# Patient Record
Sex: Male | Born: 1950 | Race: Black or African American | Hispanic: No | Marital: Single | Smoking: Never smoker
Health system: Southern US, Community
[De-identification: ages and names within clinical notes are randomized; demographics above are authoritative.]

## PROBLEM LIST (undated history)

## (undated) DIAGNOSIS — N390 Urinary tract infection, site not specified: Secondary | ICD-10-CM

## (undated) DIAGNOSIS — R634 Abnormal weight loss: Secondary | ICD-10-CM

## (undated) DIAGNOSIS — C61 Malignant neoplasm of prostate: Secondary | ICD-10-CM

## (undated) DIAGNOSIS — D649 Anemia, unspecified: Secondary | ICD-10-CM

## (undated) DIAGNOSIS — N400 Enlarged prostate without lower urinary tract symptoms: Secondary | ICD-10-CM

## (undated) DIAGNOSIS — R339 Retention of urine, unspecified: Secondary | ICD-10-CM

## (undated) DIAGNOSIS — I10 Essential (primary) hypertension: Secondary | ICD-10-CM

## (undated) DIAGNOSIS — Z466 Encounter for fitting and adjustment of urinary device: Secondary | ICD-10-CM

## (undated) DIAGNOSIS — A530 Latent syphilis, unspecified as early or late: Secondary | ICD-10-CM

## (undated) DIAGNOSIS — R399 Unspecified symptoms and signs involving the genitourinary system: Secondary | ICD-10-CM

## (undated) DIAGNOSIS — N3001 Acute cystitis with hematuria: Secondary | ICD-10-CM

## (undated) DIAGNOSIS — K759 Inflammatory liver disease, unspecified: Secondary | ICD-10-CM

## (undated) DIAGNOSIS — F0390 Unspecified dementia without behavioral disturbance: Secondary | ICD-10-CM

## (undated) HISTORY — PX: NO PAST SURGERIES: SHX2092

## (undated) HISTORY — DX: Encounter for fitting and adjustment of urinary device: Z46.6

## (undated) HISTORY — DX: Essential (primary) hypertension: I10

## (undated) HISTORY — DX: Unspecified symptoms and signs involving the genitourinary system: R39.9

## (undated) HISTORY — DX: Acute cystitis with hematuria: N30.01

## (undated) HISTORY — DX: Retention of urine, unspecified: R33.9

## (undated) HISTORY — DX: Abnormal weight loss: R63.4

---

## 2014-09-09 ENCOUNTER — Inpatient Hospital Stay
Admit: 2014-09-09 | Disposition: A | Discharge status: Home / Self Care | Payer: Self-pay | Attending: Internal Medicine | Admitting: Internal Medicine

## 2014-09-09 LAB — URINALYSIS, COMPLETE
BACTERIA: NONE SEEN
Bilirubin,UR: NEGATIVE
Glucose,UR: 150 mg/dL (ref 0–75)
Ketone: NEGATIVE
Leukocyte Esterase: NEGATIVE
Nitrite: NEGATIVE
PH: 5 (ref 4.5–8.0)
Protein: 100
RBC,UR: 13 /HPF (ref 0–5)
Specific Gravity: 1.011 (ref 1.003–1.030)
WBC UR: 5 /HPF (ref 0–5)

## 2014-09-09 LAB — COMPREHENSIVE METABOLIC PANEL
ALBUMIN: 4.3 g/dL
ANION GAP: 11 (ref 7–16)
Alkaline Phosphatase: 61 U/L
BILIRUBIN TOTAL: 1.3 mg/dL — AB
BUN: 71 mg/dL — ABNORMAL HIGH
CO2: 26 mmol/L
Calcium, Total: 9.3 mg/dL
Chloride: 99 mmol/L — ABNORMAL LOW
Creatinine: 6.86 mg/dL — ABNORMAL HIGH
EGFR (Non-African Amer.): 8 — ABNORMAL LOW
GFR CALC AF AMER: 9 — AB
GLUCOSE: 126 mg/dL — AB
POTASSIUM: 4.4 mmol/L
SGOT(AST): 45 U/L — ABNORMAL HIGH
SGPT (ALT): 57 U/L
Sodium: 136 mmol/L
TOTAL PROTEIN: 9.4 g/dL — AB

## 2014-09-09 LAB — CBC WITH DIFFERENTIAL/PLATELET
BASOS PCT: 0.4 %
Basophil #: 0 10*3/uL (ref 0.0–0.1)
EOS ABS: 0 10*3/uL (ref 0.0–0.7)
Eosinophil %: 0.1 %
HCT: 50.9 % (ref 40.0–52.0)
HGB: 17.3 g/dL (ref 13.0–18.0)
Lymphocyte #: 1.2 10*3/uL (ref 1.0–3.6)
Lymphocyte %: 10.3 %
MCH: 32.1 pg (ref 26.0–34.0)
MCHC: 34.1 g/dL (ref 32.0–36.0)
MCV: 94 fL (ref 80–100)
Monocyte #: 1 x10 3/mm (ref 0.2–1.0)
Monocyte %: 8.7 %
NEUTROS PCT: 80.5 %
Neutrophil #: 9.2 10*3/uL — ABNORMAL HIGH (ref 1.4–6.5)
Platelet: 123 10*3/uL — ABNORMAL LOW (ref 150–440)
RBC: 5.4 10*6/uL (ref 4.40–5.90)
RDW: 12.9 % (ref 11.5–14.5)
WBC: 11.4 10*3/uL — AB (ref 3.8–10.6)

## 2014-09-09 LAB — LIPASE, BLOOD: LIPASE: 45 U/L

## 2014-09-09 LAB — TROPONIN I
Troponin-I: 0.04 ng/mL — ABNORMAL HIGH
Troponin-I: 0.04 ng/mL — ABNORMAL HIGH

## 2014-09-09 LAB — CK-MB: CK-MB: 9.7 ng/mL — AB

## 2014-09-10 LAB — LIPID PANEL
Cholesterol: 127 mg/dL
HDL Cholesterol: 36 mg/dL — ABNORMAL LOW
LDL CHOLESTEROL, CALC: 72 mg/dL
Triglycerides: 94 mg/dL
VLDL Cholesterol, Calc: 19 mg/dL

## 2014-09-10 LAB — CBC WITH DIFFERENTIAL/PLATELET
BASOS PCT: 0.5 %
Basophil #: 0 10*3/uL (ref 0.0–0.1)
EOS PCT: 1.2 %
Eosinophil #: 0.1 10*3/uL (ref 0.0–0.7)
HCT: 44.6 % (ref 40.0–52.0)
HGB: 15.1 g/dL (ref 13.0–18.0)
LYMPHS PCT: 26.5 %
Lymphocyte #: 2.2 10*3/uL (ref 1.0–3.6)
MCH: 32.1 pg (ref 26.0–34.0)
MCHC: 33.9 g/dL (ref 32.0–36.0)
MCV: 95 fL (ref 80–100)
Monocyte #: 0.9 x10 3/mm (ref 0.2–1.0)
Monocyte %: 10.2 %
NEUTROS PCT: 61.6 %
Neutrophil #: 5.2 10*3/uL (ref 1.4–6.5)
PLATELETS: 107 10*3/uL — AB (ref 150–440)
RBC: 4.7 10*6/uL (ref 4.40–5.90)
RDW: 12.8 % (ref 11.5–14.5)
WBC: 8.4 10*3/uL (ref 3.8–10.6)

## 2014-09-10 LAB — BASIC METABOLIC PANEL
Anion Gap: 10 (ref 7–16)
Anion Gap: 8 (ref 7–16)
BUN: 63 mg/dL — ABNORMAL HIGH
BUN: 47 mg/dL — AB
CREATININE: 2.08 mg/dL — AB
Calcium, Total: 8.8 mg/dL — ABNORMAL LOW
Calcium, Total: 8.5 mg/dL — ABNORMAL LOW
Chloride: 104 mmol/L
Chloride: 109 mmol/L
Co2: 27 mmol/L
Co2: 24 mmol/L
Creatinine: 3.53 mg/dL — ABNORMAL HIGH
EGFR (African American): 38 — ABNORMAL LOW
GFR CALC NON AF AMER: 33 — AB
GLUCOSE: 102 mg/dL — AB
GLUCOSE: 95 mg/dL
POTASSIUM: 3.7 mmol/L
Potassium: 3.7 mmol/L
SODIUM: 141 mmol/L
Sodium: 141 mmol/L

## 2014-09-10 LAB — CK-MB
CK-MB: 6.1 ng/mL — ABNORMAL HIGH
CK-MB: 7.4 ng/mL — AB

## 2014-09-10 LAB — TROPONIN I: Troponin-I: 0.04 ng/mL — ABNORMAL HIGH

## 2014-09-11 LAB — BASIC METABOLIC PANEL
ANION GAP: 5 — AB (ref 7–16)
BUN: 33 mg/dL — ABNORMAL HIGH
CO2: 25 mmol/L
Calcium, Total: 8.6 mg/dL — ABNORMAL LOW
Chloride: 107 mmol/L
Creatinine: 1.7 mg/dL — ABNORMAL HIGH
EGFR (African American): 48 — ABNORMAL LOW
GFR CALC NON AF AMER: 42 — AB
Glucose: 124 mg/dL — ABNORMAL HIGH
POTASSIUM: 3.7 mmol/L
SODIUM: 137 mmol/L

## 2014-09-11 LAB — MAGNESIUM: Magnesium: 2.1 mg/dL

## 2014-09-12 LAB — BASIC METABOLIC PANEL
Anion Gap: 2 — ABNORMAL LOW (ref 7–16)
BUN: 17 mg/dL
CALCIUM: 8.3 mg/dL — AB
Chloride: 107 mmol/L
Co2: 28 mmol/L
Creatinine: 1.28 mg/dL — ABNORMAL HIGH
EGFR (African American): >60
GFR CALC NON AF AMER: 59 — AB
Glucose: 105 mg/dL — ABNORMAL HIGH
Potassium: 3.3 mmol/L — ABNORMAL LOW
Sodium: 137 mmol/L

## 2014-09-16 ENCOUNTER — Emergency Department
Admit: 2014-09-16 | Disposition: A | Discharge status: Home / Self Care | Payer: Self-pay | Admitting: Emergency Medicine

## 2014-09-16 LAB — CBC WITH DIFFERENTIAL/PLATELET
BASOS ABS: 0.1 10*3/uL (ref 0.0–0.1)
Basophil %: 0.6 %
EOS PCT: 0.3 %
Eosinophil #: 0 10*3/uL (ref 0.0–0.7)
HCT: 45.7 % (ref 40.0–52.0)
HGB: 15.9 g/dL (ref 13.0–18.0)
Lymphocyte #: 1 10*3/uL (ref 1.0–3.6)
Lymphocyte %: 7.5 %
MCH: 32.8 pg (ref 26.0–34.0)
MCHC: 34.7 g/dL (ref 32.0–36.0)
MCV: 95 fL (ref 80–100)
MONOS PCT: 5.5 %
Monocyte #: 0.7 x10 3/mm (ref 0.2–1.0)
Neutrophil #: 11.3 10*3/uL — ABNORMAL HIGH (ref 1.4–6.5)
Neutrophil %: 86.1 %
Platelet: 151 10*3/uL (ref 150–440)
RBC: 4.84 10*6/uL (ref 4.40–5.90)
RDW: 12.1 % (ref 11.5–14.5)
WBC: 13.1 10*3/uL — ABNORMAL HIGH (ref 3.8–10.6)

## 2014-09-16 LAB — URINALYSIS, COMPLETE
Bilirubin,UR: NEGATIVE
GLUCOSE, UR: NEGATIVE mg/dL (ref 0–75)
Ketone: NEGATIVE
Nitrite: NEGATIVE
Ph: 8 (ref 4.5–8.0)
Protein: NEGATIVE
SPECIFIC GRAVITY: 1.005 (ref 1.003–1.030)
Squamous Epithelial: NONE SEEN

## 2014-09-16 LAB — COMPREHENSIVE METABOLIC PANEL
ALBUMIN: 3.8 g/dL
AST: 36 U/L
Alkaline Phosphatase: 60 U/L
Anion Gap: 8 (ref 7–16)
BILIRUBIN TOTAL: 1.3 mg/dL — AB
BUN: 8 mg/dL
CHLORIDE: 101 mmol/L
CREATININE: 1.3 mg/dL — AB
Calcium, Total: 8.9 mg/dL
Co2: 26 mmol/L
EGFR (Non-African Amer.): 58 — ABNORMAL LOW
Glucose: 143 mg/dL — ABNORMAL HIGH
POTASSIUM: 3.4 mmol/L — AB
SGPT (ALT): 43 U/L
SODIUM: 135 mmol/L
Total Protein: 8.4 g/dL — ABNORMAL HIGH

## 2014-09-16 LAB — TROPONIN I: Troponin-I: <0.03 ng/mL

## 2014-09-17 ENCOUNTER — Emergency Department
Admit: 2014-09-17 | Disposition: A | Discharge status: Home / Self Care | Payer: Self-pay | Admitting: Emergency Medicine

## 2014-09-17 LAB — URINALYSIS, COMPLETE
Bilirubin,UR: NEGATIVE
Glucose,UR: NEGATIVE mg/dL (ref 0–75)
KETONE: NEGATIVE
Leukocyte Esterase: NEGATIVE
Nitrite: NEGATIVE
PH: 6 (ref 4.5–8.0)
Protein: 100
Specific Gravity: 1.014 (ref 1.003–1.030)

## 2014-09-18 LAB — URINE CULTURE

## 2014-09-19 LAB — URINE CULTURE

## 2014-10-10 NOTE — Discharge Summary (Signed)
PATIENT NAME:  Daniel Knapp, Daniel Knapp MR#:  [id number] DATE OF BIRTH:  10-Nov-1950  DATE OF ADMISSION:  09/09/2014 DATE OF DISCHARGE:  09/12/2014  DISCHARGE DIAGNOSES:  1.  Acute renal failure due to enlarged prostate.  2.  Hypertension.   MEDICATIONS ON DISCHARGE:  1.  Finasteride 5 mg oral tablet once a day.  2.  Tamsulosin 0.4 mg oral capsule once a day.  3.  Atorvastatin 20 mg once a day.  4.  Metoprolol 25 mg every 6 hours.   INSTRUCTIONS: Advised to discharge home with Foley catheter and Foley bag and renal diet of low sodium and follow up in urology clinic in 1-2 weeks.   HISTORY OF PRESENT ILLNESS: A 64 year old African American male with remote history of hypertension, not on antihypertensive medications, presented with 2-3 days of nausea, vomiting, and urinary incontinence. Also, had some feeling of lower abdominal fullness and intermittent pain. He had some frequent nocturia and incomplete voiding and back pain and chills over the past few days, but he denied any chest pain, palpitations, headache, or edema. No syncopal episode. On presentation to the Emergency Room, he was found to be hypertension. CT scan of the abdomen showed a bladder outlet obstruction with hydronephrosis and troponins were mildly elevated. Creatine was 6.8 on admission, so he was admitted with acute renal failure due to obstruction to hospitalist team.   [place]:  1.  Acute renal failure due to bladder outlet obstruction secondary to prostate enlargement: He was seen by urologist and nephrology. A Foley catheter was placed and started on Flomax and finasteride. Renal function improved gradually in the next 1-2 days. He was also on IV fluids, but fluids were stopped once his obstruction resolved and kidney function started improving. It had significant improvement in the next 2 days, so advised to follow in urology clinic.   2.  Hypertension: The patient was on metoprolol and hydralazine in hospital, and  blood pressure was stable.  3.  Elevated troponin: It remained stable on further follow up and most likely this was due to renal failure, so no further workup was done.   CONSULTS [place]: Nephrology with Dr. Trinda Pascal and urology consult   [place]: Troponin 0.04. WBC count was 11.4, hemoglobin 17.3, and platelet count 123,000, MCV is 94. Glucose 126, BUN 71, creatinine 6.86, sodium 136, potassium 44. Urinalysis is grossly negative. CT of the abdomen and pelvis without contrast showed significantly enlarged prostate; mild bladder distention; bilateral hydronephrosis, right greater than left; perinephric stranding around the right kidney. Echocardiogram showed ejection fraction 70%-75%, elevated mean left atrial pressure, impaired relaxation pattern of left ventricular diastolic filling. Creatinine came down on April 1 to 3.53, and on further followup in the evening it was 2.08; the next day, on April 2, creatinine was 1.74, and we decided discharge on April 3 when creatinine was 1.28.   Total Time Spent ON this discharge: 40 minutes.    ____________________________ Ceasar Lund Anselm Jungling, MD vgv:bm D: 09/15/2014 22:31:27 ET T: 09/16/2014 07:56:59 ET JOB#: [id number]  cc: Ceasar Lund. Anselm Jungling, MD, <Dictator> Sherlynn Stalls, MD  Vaughan Basta MD ELECTRONICALLY SIGNED 10/04/2014 23:31

## 2014-10-10 NOTE — H&P (Signed)
PATIENT NAME:  Daniel Knapp, Daniel Knapp MR#:  [id number] DATE OF BIRTH:  Oct 04, 1950  DATE OF ADMISSION:  09/09/2014  PRIMARY CARE PHYSICIAN: None.   REFERRING EMERGENCY ROOM PHYSICIAN: Dr. Edd Fabian.     CHIEF COMPLAINT: Nausea, vomiting, and urinary incontinence.   HISTORY OF PRESENT ILLNESS: This very pleasant 64 year old African-American male with a remote history of hypertension, not on any antihypertensive medications, presents with 2-3 days of nausea, vomiting, and urinary incontinence. He does describe a full feeling in the lower abdomen with intermittent pain. He reports that he had frequent nocturia, incomplete voiding, and incontinent with uncontrollable urination. He has also had back pain and chills over the past few days. He denies any chest pain, palpitations, headache, or edema. He states that he may have had some decrease in exercise tolerance over the past few week. No syncope. On presentation to the Emergency Room he was found to be hypertensive, CT abdomen shows a bladder outlet obstruction with hydronephrosis, and troponins are mildly elevated. Hospitalist services are asked to admit for further evaluation and treatment. The patient is in acute renal failure with creatinine of 6.8.   PAST MEDICAL HISTORY: Hypertension, not currently taking any medications.   PAST SURGICAL HISTORY: None.   SOCIAL HISTORY: The patient lives with his sister. He does not smoke cigarettes. He drinks 3-4 alcoholic beverages daily. He works as a Development worker, international aid. No illicit substance abuse.   FAMILY MEDICAL HISTORY: Positive for coronary artery disease and diabetes in his mother and chronic anemia in his father.    REVIEW OF SYSTEMS:  CONSTITUTIONAL: Positive for chills. No fevers. Positive for some fatigue and decreased exercise tolerance. No change in weight.  HEENT: No change in vision or hearing. No pain in the eyes or ears. No sore throat or difficulty swallowing. No sinus congestion.  RESPIRATORY: No shortness  of breath, wheezing, coughing, or painful respiration. CARDIOVASCULAR: No chest pain, palpitations, syncope, orthopnea, or edema.  GASTROINTESTINAL: Positive as above for nausea and vomiting without diarrhea. Positive for abdominal pain. No hematemesis.  GENITOURINARY: Positive for frequency, dysuria, incontinence, and lower abdominal pain.  SKIN: No new rashes, lesions, or wounds.  ENDOCRINE: No polyuria, polydipsia, hot or cold intolerance.  MUSCULOSKELETAL: No new pain in the neck, shoulders, knees, or hips. He does have back pain which is new. No history of gout.  NEUROLOGIC: No focal numbness or weakness. No headaches, seizure, or confusion. No CVA history.  PSYCHIATRIC: No bipolar disorder or schizophrenia.   HOME MEDICATIONS: None.   ALLERGIES: No known allergies.   PHYSICAL EXAMINATION:  VITAL SIGNS: Temperature 98, pulse 101, respirations 18, blood pressure 166/101, oxygenation 99% on room air.  GENERAL: No acute distress.  HEENT: Pupils equal, round, and reactive to light. Conjunctivae are clear. Extraocular motion intact. Oral mucous membranes pink and moist. Posterior oropharynx is clear with no exudate, edema, or erythema. He has upper dentures lower teeth are intact. No signs of dental infection.  NECK: No cervical lymphadenopathy. Trachea midline. Thyroid nontender.  RESPIRATORY: Lungs are clear to auscultation bilaterally with good air movement.  CARDIOVASCULAR: Regular rate and rhythm. No murmurs, rubs, or gallops. No peripheral edema. Peripheral pulses are 1 +. No JVD. No carotid bruit.  ABDOMEN: Soft, nontender, nondistended. Bowel sounds are normal. No guarding, no rebound, no hepatosplenomegaly. No mass.  MUSCULOSKELETAL: No swollen tender joints. Range of motion is normal. Strength is 5 out of 5 throughout.  SKIN: No rashes, lesions, or open wounds.  NEUROLOGIC: Cranial nerves II through XII grossly intact.  Strength and sensation are intact, nonfocal.  PSYCHIATRIC: The  patient is alert and oriented with good insight into his clinical conditions. No signs of uncontrolled depression or anxiety.   LABORATORY DATA:  Sodium 136, potassium 4.4, chloride 99, bicarbonate 26, BUN 71, creatinine 6.8, glucose 126, total protein 9.4, albumin 4.3, bilirubin 1.3, alkaline phosphatase 61, AST 45, ALT 57. Troponin 0.04. White blood cells 11.4, hemoglobin 17.3, platelets 123,000, MCV is 94. UA with proteinuria, 13 red blood cells per high-powered field and 5 white blood cells per high-powered field.   [place]:  1.  CT of the abdomen and pelvis without contrast shows significantly enlarged prostate, mild bladder distention, and bilateral hydronephrosis right greater than left, perinephric stranding around the right kidney and ureter, no renal or ureteral stones, findings presumably related to bladder outlet obstruction.  2.  Chest x-ray shows no active cardiopulmonary disease.   ASSESSMENT AND PLAN:  1.  Acute renal failure with creatinine of 6.8: This is new. The patient does not have any history of renal failure in the past. I suspect that this is likely due to bladder outlet obstruction. He currently has a Foley catheter in place which is draining well. We will provide IV fluid and continued to the Foley catheter. Avoid nephrotoxic agents at this time. Continue to monitor his renal function. I have consulted with nephrology as he also has proteinuria. At this point we will hold off on additional renal testing to see if renal function improves with resolution of bladder outlet obstruction.  2.  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 I have started Flomax and placed a Foley catheter. I have consulted urology. The patient denies hematuria prior to placement of Foley catheter.  3.  Hypertension: The patient reports that he has a history of hypertension in the past, but stopped taking any medications for this. I have started metoprolol and hydralazine for blood pressure  control. He will also be on Flomax, which may help with blood pressure control. He will need an outpatient regimen which will be determined by his renal status upon discharge.  4.  Elevated troponin: The patient has mildly elevated troponin, but denies any overt signs of angina, no chest pain, shortness of breath, possible decreased exercise tolerance over time. EKG with no signs of ischemia. We will cycle cardiac enzymes and continue with aspirin and statin. We will monitor him on telemetry and obtain a 2-D echocardiogram which I suspect will show left ventricular hypertrophy due to uncontrolled hypertension over time.  5.  Prophylaxis. Heparin for DVT prophylaxis. No GI prophylaxis needed at this time.   CODE STATUS:  The patient is a full code.   TIME SPENT ON ADMISSION:  50 minutes.     ____________________________ Earleen Newport. Volanda Napoleon, MD cpw:bu D: 09/09/2014 20:40:53 ET T: 09/09/2014 21:12:22 ET JOB#: [id number]  cc: Earleen Newport. Volanda Napoleon, MD, <Dictator> Aldean Jewett MD ELECTRONICALLY SIGNED 09/18/2014 14:38

## 2014-10-10 NOTE — Consult Note (Signed)
Consulting MD:  Volanda Napoleon complaint: Urinary retention, ARF 64 yo male admitted top [facility] yesterday w/ 1 week h/o nausea, feeling weak, fatigue, slow urinary stream, incontinence of urine (night>day), DOE. He has no prior h/o urinary issues. He has had tolerable decreased FOS in the past but has never sought help for this. He states that hios father had prostate problems, but does not know of family h/o PCa. He presented to the Er where he was found to be in urinary retention, w/ a distended bladder, bilateral hydro on CT as well as a serum creatinine of 7. His potassium was normal. A catheter was placed and he was admityted for medical and urologic management. A 14 fr foley was placed, and he has had some mild gross hematuria which was not pressent prior to catheter placement. remote h/o HBP diagnosed and treated while incarcerated several years ago-- not maneged lately--pt does not have a PCP N/A  N/A Single, lives w/ sister, self employed in [place] Pt states his father had prostate problems. CAD, anemia slow stream, urinary incontinence, nausea, lower abdominal pain   WDWN middle aged male in [place]/AT PERRL  EOMI supple CTA bilaterally RRR flat, soft, NDNT phallus uncirc. No lesions/phimosis. Cath present.Testers down bilaterally but atrophic NST. Gland nonnodular, 4+ w/o C,C,E grossly intact reviewed moderate bilateral hydrourweteronephrosis. Parenchyma appears normal (w/o contrast). Prostate large, likely w/ intravesical median lobe.  AUR--secondary to large prostate. No recent use of alpha adrenergic agents. Currently appropriately managed w/ foley drainage (although larger 16 or 18 fr foley better for men than current 14 fr.) Bilateral hydro w/ ARF secondary to AUR--hopefully this will improve w/ current catheter drainage Post obstructive diuresis Hematuria--most likely secondary to catheter trauma in a man w/ a large prostate--this is minimal and hopefully will clear   Agree w/  flomax--to maximaize medical therapy will add finmasteride--he should go home on both of these He should go home once Cr trend normalizes w/ foley--will need followup here in [place] with Dr Erlene Quan to discuss possible voiding trial vs Surgical management of BOO Would not over treat diuresis w/ IV fluids--this will resolve w/o IV fluid replacement as long as pt can take po fluis well. Keep check on K+ Arrange for follwup w/ Dr Erlene Quan follwing D/C I discussed current issues as well as followup w/ pt M. Dahlstedt, MD [facility], [place]  Electronic Signatures: Diona Fanti Lillette Boxer (MD)  (Signed on 01-Apr-16 11:36)  Authored  Last Updated: 01-Apr-16 11:36 by Jorja Loa (MD)

## 2014-10-10 NOTE — Consult Note (Signed)
Referring Physician: Dr Volanda Napoleon for Consult: AKI Daniel Knapp is a 64 year old AAM with hypertension and h/o enlarged prostate. He was in prison until about 3-4 years ago and was receiving some attention to these problems. However, when he was released, he did not follow with a physician. His baseline Cr is unknown and he was not taking any Rx medications. [name] with N/V and was found to have a Cr of 6.8. CT Scan no contrast demonstrated mild hydro on the left and moderate hydro on the right with an enlarged prostate. A foley catheter was placed. Urology saw the patient today. His Cr was 3.5 today. He is getting NS at 70 cc/hr. His urine is bloody. Daniel Knapp is pleasant and answering questions. He denies any physical complaints. He has not been ambulating. HTNEnlarged prostate   No kidney diseases 2 shots of ETOH/day  98  71  18  121/65   93RAclear bilalertRRR, no rubSoft, NTno edemaFoley draining with bloody urine Scan reviewedreviewed 64 year old AAM with unknown baseline Cr level now with AKI in settting of BOO AKI- baseline Cr unknown but not likely normal due to hypertension untreated. Continue Foley and will need long term f/up with urology and plan regarding drainage/enlarged prostate. Continue IVF but would change to 1/2 NS at 70 cc/hr and recheck BMP + Mg q12 to watch for hypernatremia, hypokalemia and hypomagnesemia. Replete lytes as needed. Would decrease/stop IVF in next 24hrs if patient able to take good oral liquid intake as IVF will stimulate further urine output. Pt should be monitored 24hrs off IVF to ensure stability of BP and renal function. HTN- continue metoprolol but would recommend stopping IV hydralazine. In addition, hydralazine should be decreased to bid for now as BP is well controlled and since he is at greater risk for over control of BP with diuresis/ambulation. Hopefully, he could only be discharged on metoprolol Enlarged prostate Hematuria/Proteinuria- no further  w/up at this time in the setting of AKI and BOO. We can re-evaluate these as an [place] TO SEE THE PATIENT IN FOLLOW-UP AT [place]. AN APPOINTMENT CAN BE SCHEDULED AT [phone] ON MONDAY FROM 8AM-5PM. I WILL NOT BE SEEING THE PATIENT ON 4/2 OR 4/3. PLEASE PAGE [phone] WITH QUESTIONS ON 4/2 AND 4/3.  Electronic Signatures: Trinda Pascal (MD)  (Signed on 01-Apr-16 14:54)  Authored  Last Updated: 01-Apr-16 14:54 by Trinda Pascal (MD)

## 2014-11-10 ENCOUNTER — Encounter: Payer: Self-pay | Admitting: Urology

## 2014-11-10 ENCOUNTER — Encounter: Payer: Self-pay | Admitting: *Deleted

## 2014-11-10 ENCOUNTER — Ambulatory Visit (INDEPENDENT_AMBULATORY_CARE_PROVIDER_SITE_OTHER): Payer: Self-pay | Admitting: Urology

## 2014-11-10 ENCOUNTER — Telehealth: Payer: Self-pay | Admitting: *Deleted

## 2014-11-10 VITALS — BP 143/83 | HR 69 | Ht 74.0 in | Wt 217.0 lb

## 2014-11-10 DIAGNOSIS — R338 Other retention of urine: Secondary | ICD-10-CM

## 2014-11-10 DIAGNOSIS — N401 Enlarged prostate with lower urinary tract symptoms: Secondary | ICD-10-CM | POA: Insufficient documentation

## 2014-11-10 DIAGNOSIS — R339 Retention of urine, unspecified: Secondary | ICD-10-CM

## 2014-11-10 DIAGNOSIS — N400 Enlarged prostate without lower urinary tract symptoms: Secondary | ICD-10-CM

## 2014-11-10 NOTE — Telephone Encounter (Signed)
Start day of Epic and I hit quick abstraction tab to input patient info and it saved it as a telephone message.

## 2014-11-10 NOTE — Progress Notes (Signed)
Simple Catheter Placement Due to urinary retention patient is present today for a foley cath placement.Patient was cleaned and prepped in a sterile fashion with betadine and lidocaine jelly 2 % was instilled into the uretha. A 18 FR coude foley catheter was inserted, urine return was noted 50 ml, urine was dark  yellow in color. The balloon was filled with 10 cc of sterile water. A leg bag was attached for drainage. Patient tolerated well, no complications.  Performed By: Lyndee Hensen CMA

## 2014-11-10 NOTE — Patient Instructions (Signed)
Patient is to continue the finasteride and Flomax. He will return in 1 month's time for catheter exchange.

## 2014-11-10 NOTE — Progress Notes (Signed)
Subjective:    Patient ID: Daniel Knapp, male    DOB: Mar 27, 1951, 64 y.o.   MRN: [id number]  HPI Daniel Knapp is a 64 year old African American male who presented to [place] in April after a recent admission for difficulty urinating with overflow incontinence and he was found to have acute renal failure related to lower tract obstruction from an enlarged prostate from March 31 through April 4. He was also seen in the emergency department on April 10 for chills and was found to have a urinary tract infection. He was started on Cipro at that time and has completed that regimen of antibiotic.  He was started on Flomax and finasteride in April 2016 after the findings of an enlarged prostate on CT scan.    Notes concerning admission in March: On admission he was found to be in urinary retention with distended bladder, bilateral hydronephrosis and significantly enlarged prostate on CT. Serum Cr was 6.89. A Foley catheter was placed and Flomax/Finasteride started. Over hospital course patient's Cr significantly improved and was 1.28 upon discharge 09/12/14.   CT ABDOMEN AND PELVIS W0 - Sep 09 2014 4:55PM FINDINGS: Adrenals/Urinary Tract: Adrenal glands normal. There is mild bilateral hydronephrosis scratch head there is mild left hydronephrosis and moderate right hydronephrosis. Perinephric stranding around the right kidney and proximal right ureter. Urinary bladder is distended. Prostate is enlarged. No renal or ureteral stones. IMPRESSION: Significantly enlarged prostate. Mild bladder distention and bilateral hydronephrosis, right greater than left. Perinephric stranding around the right kidney and ureter. No renal or ureteral stones. Findings presumably related to bladder outlet obstruction.  Patient has failed his voiding trial and an indwelling Foley is in place. I discussed with him CIC, but he did not want to learn how to catheterize himself.  This is because he works in [place] and is outdoors most of the time. I'm sure he has some embarrassment with the idea of catheterizing himself with his coworkers around him. Because of this,  he is found an indwelling Foley more convenient. He presented last week complaining of pus around the catheter at the meatus and he is describing what sounds like bladder spasms with leakage around the catheter. He presented to the office and the staff sent a urine for culture which returned contaminated.  Today, he presents for the same complaints. Reviewing his chart it is time to change the Foley catheter.  No Known Allergies   Medications Flomax 0.4 mg 1 capsule daily Finasteride 5 mg 1 tablet daily   Past Medical History  Diagnosis Date  . HTN (hypertension)   . Acute cystitis with hematuria   . BPH without obstruction/lower urinary tract symptoms   . Weight loss   . Urinary retention   . UTI symptoms   . Urinary catheter insertion/adjustment/removal    History   Social History  . Marital Status: Single    Spouse Name: N/A  . Number of Children: N/A  . Years of Education: N/A   Occupational History  . Not on file.   Social History Main Topics  . Smoking status: Never Smoker   . Smokeless tobacco: Not on file  . Alcohol Use: 0.0 oz/week    0 Standard drinks or equivalent per week     Comment: moderate  . Drug Use: Not on file  . Sexual Activity: Not on file   Other Topics Concern  . Not on file   Social History Narrative   No past surgical history on file.  Family History  Problem Relation Age of Onset  . Cancer Father   . Heart disease Mother    Review of Systems  Constitutional: Negative for fever, chills, diaphoresis, activity change, appetite change, fatigue and unexpected weight change.  HENT: Negative for congestion, dental problem, drooling, ear discharge, ear pain, facial swelling, hearing loss, sinus pressure, sneezing, sore throat, trouble swallowing and voice change.   Eyes:  Negative for photophobia, pain, discharge, redness, itching and visual disturbance.  Respiratory: Negative for apnea, cough, choking, chest tightness, shortness of breath, wheezing and stridor.   Cardiovascular: Negative for chest pain, palpitations and leg swelling.  Gastrointestinal: Negative for nausea, vomiting, diarrhea and constipation.  Endocrine: Negative for cold intolerance, heat intolerance, polydipsia, polyphagia and polyuria.  Genitourinary: Positive for discharge. Negative for hematuria.  Musculoskeletal: Negative for back pain and joint swelling.  Skin: Negative for color change, pallor, rash and wound.  Allergic/Immunologic: Negative for environmental allergies, food allergies and immunocompromised state.  Neurological: Negative for seizures, syncope and speech difficulty.  Hematological: Negative for adenopathy. Does not bruise/bleed easily.  Psychiatric/Behavioral: Negative for behavioral problems, confusion, sleep disturbance and agitation.   Filed Vitals:   11/10/14 0854  BP: 143/83  Pulse: 69   Filed Weights   11/10/14 0854  Weight: 217 lb (98.431 kg)      Objective:   Physical Exam  GU: Patient with an uncircumcised penis, Foley catheter in place, no drainage seen around the meatus at this time. Leg bag filled with a scant amount of urine with thick sediment. Patient states he emptied the bag recently.      Assessment & Plan:  1. Urinary retention          Patient was initiated on Flomax and finasteride in April after being admitted to the hospital. He was found to have an enlarged prostate on a CT scan performed in the emergency room. He has failed a voiding trial with [place]. We discussed CIC with the patient, but he is not willing to proceed with self-catheterization. 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  2. BPH with urinary  retention           Patient is currently on Flomax and finasteride in hopes that it may decrease the side of his prostate and therefore relieve his urinary retention. Patient has had indwelling Foley and since April he has failed one voiding trial with [place]. He did not have a PCP. He also does not have a recent PSA. We'll obtain a PSA once patient is voiding successfully on his own. Hopefully in July, we can attempt another voiding trial to see if he is able to void on his own.  3. Catheter exchanged           Is explained to the patient today that the pus that he is seen around the meatus is due to the irritation of the Foley catheter. Cesarean normal finding. I also explained to the patient that the urine squirting around the catheter is due to bladder spasms. I explained to him that I was hesitant to start a medication to treat the bladder spasms because it can inhibit his ability to urinate when we do attempt a voiding trial again in July. Patient's Foley catheter was exchanged today he will present in 1 month for his next catheter exchange.

## 2014-12-03 ENCOUNTER — Ambulatory Visit (INDEPENDENT_AMBULATORY_CARE_PROVIDER_SITE_OTHER): Payer: Self-pay | Admitting: Urology

## 2014-12-03 ENCOUNTER — Encounter: Payer: Self-pay | Admitting: Urology

## 2014-12-03 VITALS — BP 156/80 | HR 72 | Ht 74.0 in | Wt 218.2 lb

## 2014-12-03 DIAGNOSIS — R339 Retention of urine, unspecified: Secondary | ICD-10-CM

## 2014-12-03 DIAGNOSIS — N401 Enlarged prostate with lower urinary tract symptoms: Secondary | ICD-10-CM

## 2014-12-03 DIAGNOSIS — N400 Enlarged prostate without lower urinary tract symptoms: Secondary | ICD-10-CM

## 2014-12-03 DIAGNOSIS — R338 Other retention of urine: Principal | ICD-10-CM

## 2014-12-03 NOTE — Progress Notes (Signed)
12/03/2014 10:43 AM   Daniel Knapp [birth year] [id number]  Referring provider: No referring provider defined for this encounter.  Chief Complaint  Patient presents with  . Urinary Retention    One month follow up    HPI: Daniel Knapp is a 64 year old African-American male with an indwelling Foley who presents today for 1 month follow-up.  Patient had a Foley catheter placed in March 2016 after discovering he had bilateral hydronephrosis due to urinary tract obstruction from an enlarged prostate. He was placed on tamsulosin and finasteride in April 2016. 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 So even managing his bladder all at obstruction with an indwelling Foley which we exchange monthly.  Patient's would like to his Foley catheter removed for another voiding trial.  He had failed a voiding trial in a few weeks earlier. He is hopeful that he has been on the tamsulosin and finasteride long enough to cause a reduction in the size of his prostate allowing him to urinate freely on his own.  PMH: Past Medical History  Diagnosis Date  . HTN (hypertension)   . Acute cystitis with hematuria   . BPH without obstruction/lower urinary tract symptoms   . Weight loss   . Urinary retention   . UTI symptoms   . Urinary catheter insertion/adjustment/removal     Surgical History: No past surgical history on file.  Home Medications:    Medication List       This list is accurate as of: 12/03/14 10:43 AM.  Always use your most recent med list.               finasteride 5 MG tablet  Commonly known as:  PROSCAR  Take 5 mg by mouth daily.     metoprolol tartrate 25 MG tablet  Commonly known as:  LOPRESSOR  Take 25 mg by mouth 2 (two) times daily.     tamsulosin 0.4 MG Caps capsule  Commonly known as:  FLOMAX  Take 0.4 mg by mouth daily.        Allergies: No Known Allergies  Family History: Family History  Problem Relation Age of  Onset  . Cancer Father   . Heart disease Mother   . Kidney disease Neg Hx     Social History:  reports that he has never smoked. He does not have any smokeless tobacco history on file. He reports that he drinks alcohol. He reports that he does not use illicit drugs.  ROS: Urological Symptom Review  Patient is experiencing the following symptoms: Get up at night to urinate   Review of Systems  Gastrointestinal (upper)  : Negative for upper GI symptoms  Gastrointestinal (lower) : Negative for lower GI symptoms  Constitutional : Negative for symptoms  Skin: Negative for skin symptoms  Eyes: Negative for eye symptoms  Ear/Nose/Throat : Negative for Ear/Nose/Throat symptoms  Hematologic/Lymphatic: Negative for Hematologic/Lymphatic symptoms  Cardiovascular : Negative for cardiovascular symptoms  Respiratory : Negative for respiratory symptoms  Endocrine: Negative for endocrine symptoms  Musculoskeletal: Negative for musculoskeletal symptoms  Neurological: Negative for neurological symptoms  Psychologic: Negative for psychiatric symptoms   Physical Exam: BP 156/80 mmHg  Pulse 72  Ht 6\' 2"  (1.88 m)  Wt 218 lb 3.2 oz (98.975 kg)  BMI 28.00 kg/m2   Laboratory Data: Results for orders placed or performed in visit on 12/03/14  PSA  Result Value Ref Range   Prostate Specific Ag, Serum 3.3 0.0 -  4.0 ng/mL   Lab Results  Component Value Date   WBC 13.1* 09/16/2014   HGB 15.9 09/16/2014   HCT 45.7 09/16/2014   MCV 95 09/16/2014   PLT 151 09/16/2014    Lab Results  Component Value Date   CREATININE 1.30* 09/16/2014    No results found for: PSA  No results found for: TESTOSTERONE  No results found for: HGBA1C  Urinalysis No results found for: COLORURINE, APPEARANCEUR, LABSPEC, PHURINE, GLUCOSEU, HGBUR, BILIRUBINUR, KETONESUR, PROTEINUR, UROBILINOGEN, NITRITE, LEUKOCYTESUR  Pertinent Imaging:   Assessment & Plan:    1. Urinary retention:   Patient was initiated on Flomax and finasteride in April after being admitted to the hospital for acute renal failure due to BOO.  He has failed a voiding trial with [place]. We discussed CIC with the patient, but he is not willing to proceed with self-catheterization. 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  He would like to have another voiding trial.  He will return on Monday morning for catheter removal and a voiding trial.  2. BPH with urinary retention:  Patient is currently on Flomax and finasteride in hopes that it may decrease the side of his prostate and therefore relieve his urinary retention. Patient has had indwelling Foley since April.  He has failed one voiding trial with [place]. He did not have a PCP.  PSA is drawn today.  He will return on Monday for a voiding trial.    - PSA   No Follow-up on file.  Zara Council, [facility] [address] [place], [place] [address] [phone]

## 2014-12-04 LAB — PSA: PROSTATE SPECIFIC AG, SERUM: 3.3 ng/mL (ref 0.0–4.0)

## 2014-12-06 ENCOUNTER — Telehealth: Payer: Self-pay

## 2014-12-06 ENCOUNTER — Ambulatory Visit (INDEPENDENT_AMBULATORY_CARE_PROVIDER_SITE_OTHER): Payer: Self-pay | Admitting: *Deleted

## 2014-12-06 VITALS — BP 153/89 | HR 64 | Resp 18 | Ht 74.0 in | Wt 218.3 lb

## 2014-12-06 DIAGNOSIS — R339 Retention of urine, unspecified: Secondary | ICD-10-CM

## 2014-12-06 NOTE — Telephone Encounter (Signed)
Pt was given lab results when he came into the office. Cw,lpn

## 2014-12-06 NOTE — Telephone Encounter (Signed)
-----   Message from Nori Riis, PA-C sent at 12/04/2014 10:04 PM EDT ----- PSA is good.  He is going to have a voiding trial on Monday.

## 2014-12-06 NOTE — Progress Notes (Signed)
Fill and Pull Catheter Removal  Patient is present today for a catheter removal.  Patient was cleaned and prepped in a sterile fashion 210 ml of sterile water/ saline was instilled into the bladder when the patient felt the urge to urinate. 5 ml of water was then drained from the balloon.  A 18FR foley cath was removed from the bladder no complications were noted .  Patient as then given some time to void on their own.  Patient can void  100 ml on their own after some time.  Patient tolerated well.  Preformed by: Golden Hurter, CMA   Follow up/ Additional notes: Pt. was advised to return if unable to void on his own in 3-4 hr's or sooner if he experiences urinary problems.  Simple Catheter Placement  Due to urinary retention patient is present today for a foley cath placement.  Patient was cleaned and prepped in a sterile fashion with betadine and lidocaine jelly 2% was instilled into the urethra.  A 18 FR foley catheter was inserted, urine return was noted  745ml, urine was dark yellow in color.  The balloon was filled with 10cc of sterile water.  A leg bag was attached for drainage. Patient was given instruction on proper catheter care.  Patient tolerated well, no complications were noted   Preformed by: Golden Hurter, CMA

## 2014-12-07 ENCOUNTER — Telehealth: Payer: Self-pay | Admitting: Urology

## 2014-12-07 NOTE — Telephone Encounter (Signed)
Patient will need an office visit in 1 month's time for Foley catheter exchange.

## 2015-01-13 NOTE — Telephone Encounter (Signed)
That is fine 

## 2015-01-13 NOTE — Telephone Encounter (Signed)
I called the patient and put this on the nurse's schd is this ok? He is coming in on 01-14-15  [facility]

## 2015-01-14 ENCOUNTER — Ambulatory Visit (INDEPENDENT_AMBULATORY_CARE_PROVIDER_SITE_OTHER): Payer: Self-pay | Admitting: *Deleted

## 2015-01-14 VITALS — BP 148/84 | HR 68 | Resp 18 | Wt 215.4 lb

## 2015-01-14 DIAGNOSIS — R339 Retention of urine, unspecified: Secondary | ICD-10-CM

## 2015-01-14 MED ORDER — LIDOCAINE HCL 2 % EX GEL: 1.0000 "application " | Freq: Once | CUTANEOUS | Status: DC

## 2015-01-14 NOTE — Progress Notes (Signed)
Pt came back in during lunch time c/o blood in foley bag. Nurse inspected bag and noted large amount of viscous bright red blood with 1 large clot. Pt c/o of pain in penis 6/10. Nurse changed foley.  Cath Change/ Replacement  Patient is present today for a catheter change due to urinary retention.  45ml of water was removed from the balloon, a 18FR foley cath was removed with out difficulty.  Patient was cleaned and prepped in a sterile fashion with betadine and 2% lidocaine jelly was instilled into the urethra. A 16 FR coude foley cath was replaced into the bladder no complications were noted Urine return was noted 377ml and urine was clear and light yellow in color. The balloon was filled with 68ml of sterile water. A leg bag was attached for drainage.   Preformed by: Toniann Fail, LPN  Follow up: Pt was instructed to go home and drink plenty of fluids to help flush any remaining blood. Pt was instructed to call office back if blood gets worse, n/v, f/c develop, or if catheter stops draining. Nurse advised pt to go to the ER over the weekend if these symptoms arise. Pt voiced understanding.

## 2015-01-14 NOTE — Progress Notes (Signed)
Cath Change/ Replacement  Patient is present today for a catheter change due to urinary retention.  7 ml of water was removed from the balloon, a 18 FR foley cath was removed with out difficulty.  Patient was cleaned and prepped in a sterile fashion with betadine and 2% lidocaine jelly was instilled into the urethra. A 18 [place] foley cath was replaced into the bladder no complications were noted Urine return was noted 35ml and urine was dark yellow in color; there was a slight amount of blood noted at the start of the return that cleared. The balloon was filled with 20ml of sterile water. A leg bag was attached for drainage.  A night bag was also given to the patient and patient was given instruction on how to change from one bag to another. Patient was given proper instruction on catheter care.    Preformed by: Golden Hurter, CMA

## 2015-01-28 ENCOUNTER — Ambulatory Visit (INDEPENDENT_AMBULATORY_CARE_PROVIDER_SITE_OTHER): Payer: Self-pay

## 2015-01-28 DIAGNOSIS — R339 Retention of urine, unspecified: Secondary | ICD-10-CM

## 2015-01-28 NOTE — Progress Notes (Signed)
Pt walked in c/o lower abd discomfort, pain scale 8/10. Pt stated he had a hole in his leg bag therefore he plugged the catheter and now cant get urine out. Nurse unplugged catheter and drained 800cc from catheter. Pt voiced relief. Nurse gave pt a new leg bag.

## 2015-03-18 ENCOUNTER — Ambulatory Visit (INDEPENDENT_AMBULATORY_CARE_PROVIDER_SITE_OTHER): Payer: Self-pay

## 2015-03-18 DIAGNOSIS — R339 Retention of urine, unspecified: Secondary | ICD-10-CM

## 2015-03-18 MED ORDER — LIDOCAINE HCL 2 % EX GEL
1.0000 "application " | Freq: Once | CUTANEOUS | Status: AC
  Administered 2015-03-18: 1 via URETHRAL

## 2015-03-18 NOTE — Progress Notes (Signed)
Cath Change/ Replacement  Patient is present today for a catheter change due to urinary retention.  24ml of water was removed from the balloon, a 16FR coude foley cath was removed with out difficulty.  Patient was cleaned and prepped in a sterile fashion with betadine and 2% lidocaine jelly was instilled into the urethra. A 18 FR coude foley cath was replaced into the bladder no complications were noted Urine return was noted 15ml and urine was yellow in color. The balloon was filled with 10ml of sterile water. A leg bag was attached for drainage.  A night bag was also given to the patient and patient was given instruction on how to change from one bag to another. Patient was given proper instruction on catheter care.    Preformed by: Toniann Fail, LPN  Follow up: Pt will return in 32mo for a f/u with [facility]. A 51fr coude was removed today due to trauma at last cath visit. A 64fr coude was replaced due to original orders.

## 2015-04-06 ENCOUNTER — Telehealth: Payer: Self-pay

## 2015-04-06 NOTE — Telephone Encounter (Signed)
Pt walked in today c/o blood in foley leg bag. Nurse looked at bag and dark red urine with clots were noted. Per Dr. Pilar Jarvis pt should drink plenty of water and at next cath change have a cysto. At check out pt changed appt to cysto with cath change.

## 2015-04-13 ENCOUNTER — Encounter: Payer: Self-pay | Admitting: Urology

## 2015-04-13 ENCOUNTER — Ambulatory Visit (INDEPENDENT_AMBULATORY_CARE_PROVIDER_SITE_OTHER): Payer: Self-pay | Admitting: Urology

## 2015-04-13 VITALS — BP 185/94 | HR 80 | Ht 74.0 in | Wt 216.3 lb

## 2015-04-13 DIAGNOSIS — R339 Retention of urine, unspecified: Secondary | ICD-10-CM

## 2015-04-13 DIAGNOSIS — N400 Enlarged prostate without lower urinary tract symptoms: Secondary | ICD-10-CM

## 2015-04-13 DIAGNOSIS — N401 Enlarged prostate with lower urinary tract symptoms: Secondary | ICD-10-CM

## 2015-04-13 DIAGNOSIS — R310 Gross hematuria: Secondary | ICD-10-CM

## 2015-04-13 DIAGNOSIS — N138 Other obstructive and reflux uropathy: Secondary | ICD-10-CM

## 2015-04-13 MED ORDER — LIDOCAINE HCL 2 % EX GEL
1.0000 "application " | Freq: Once | CUTANEOUS | Status: AC
  Administered 2015-04-13: 1 via URETHRAL

## 2015-04-13 MED ORDER — CIPROFLOXACIN HCL 500 MG PO TABS
500.0000 mg | ORAL_TABLET | Freq: Once | ORAL | Status: AC
  Administered 2015-04-13: 500 mg via ORAL

## 2015-04-13 NOTE — Progress Notes (Signed)
Catheter Removal  Patient is present today for a catheter removal.  64ml of water was drained from the balloon. A 18FR foley cath was removed from the bladder no complications were noted . Patient tolerated well.  Preformed by: Fonnie Jarvis, CMA    After Cysto patient tried to void on his own (bladder was filled with 250cc of water during cysto. Patient was unable to void and was given the option per Dr. Elnoria Howard to have the foley replaced or CIC which would be preferred.  After detailed discussion with the patient he agreed to be taught CIC.  Continuous Intermittent Catheterization  Due to urinary retention patient is present today for a teaching of self I & O Catheterization. Patient was given detailed verbal and printed instructions of self catheterization. Patient was cleaned and prepped in a sterile fashion.  With instruction and assistance patient inserted a 14FR and urine return was noted 550 ml, urine was yellow in color. Patient tolerated well, no complications were noted Patient was given a sample bag with supplies to take home.  Instructions were given per Dr. Elnoria Howard for patient to cath 3-4 times daily. Patient is to follow up in January to discuss possible surgery, patient does not currently have insurance and will get medicare in January and would like to wait until then to pursue surgery, he is to continue with CIC until then.   Preformed by: Fonnie Jarvis, CMA  Additional Notes: Patient prefers using the coloplast compact pocket caths

## 2015-04-13 NOTE — Progress Notes (Signed)
04/13/2015 2:53 PM   Karie Fetch September 02, 1950 [id number]  Referring provider: No referring provider defined for this encounter.  Chief Complaint  Patient presents with  . Cysto    cath change    HPI: History of urinary retention and BPH. Result presented with a creatinine to 6.9 with hydronephrosis. Has had an indwelling Foley now for a year. At gross hematuria was same selected cystoscopy today patient desires to wait until December 2016 for his Medicare before he decides to do a hospital procedures as he has no insurance    PMH: Past Medical History  Diagnosis Date  . HTN (hypertension)   . Acute cystitis with hematuria   . Weight loss   . Urinary retention   . UTI symptoms   . Urinary catheter insertion/adjustment/removal     Surgical History: History reviewed. No pertinent past surgical history.  Home Medications:    Medication List       This list is accurate as of: 04/13/15  2:53 PM.  Always use your most recent med list.               finasteride 5 MG tablet  Commonly known as:  PROSCAR  Take 5 mg by mouth daily.     metoprolol tartrate 25 MG tablet  Commonly known as:  LOPRESSOR  Take 25 mg by mouth 2 (two) times daily.     tamsulosin 0.4 MG Caps capsule  Commonly known as:  FLOMAX  Take 0.4 mg by mouth daily.        Allergies: No Known Allergies  Family History: Family History  Problem Relation Age of Onset  . Cancer Father   . Heart disease Mother   . Kidney disease Neg Hx     Social History:  reports that he has never smoked. He does not have any smokeless tobacco history on file. He reports that he drinks alcohol. He reports that he does not use illicit drugs.  ROS:  genitourinary urinary retention therefore no incontinence. Status post renal failure secondary to outlet obstruction from his prostate   cardiovascular works as a Development worker, international aid no chest pain shortness of breath with exertion    respiratory no dyspnea pneumonia  asthma bronchitis      GI no nausea vomiting constipation diarrhea    musculoskeletal back pain present some knee pain mostly this is secondary to osteoarthritis   HEENT no history of sinus infections ear pain tinnitus dizziness                    Physical Exam: BP 185/94 mmHg  Pulse 80  Ht 6\' 2"  (1.88 m)  Wt 216 lb 4.8 oz (98.113 kg)  BMI 27.76 kg/m2  Constitutional:  Alert and oriented, No acute distress. HEENT: [place] AT, moist mucus membranes.  Trachea midline, no masses. Cardiovascular: No clubbing, cyanosis, or edema. Respiratory: Normal respiratory effort, no increased work of breathing. GI: Abdomen is soft, nontender, nondistended, no abdominal masses GU: No CVA tenderness. Circumcised prostate grade 2 over 4 Skin: No rashes, bruises or suspicious lesions. Lymph: No cervical or inguinal adenopathy. Neurologic: Grossly intact, no focal deficits, moving all 4 extremities. Psychiatric: Normal mood and affect.  Laboratory Data: Lab Results  Component Value Date   WBC 13.1* 09/16/2014   HGB 15.9 09/16/2014   HCT 45.7 09/16/2014   MCV 95 09/16/2014   PLT 151 09/16/2014    Lab Results  Component Value Date   CREATININE 1.30* 09/16/2014    Lab  Results  Component Value Date   PSA 3.3 12/03/2014    No results found for: TESTOSTERONE  No results found for: HGBA1C  Urinalysis    Component Value Date/Time   COLORURINE Amber 09/17/2014 2237   APPEARANCEUR Hazy 09/17/2014 2237   LABSPEC 1.014 09/17/2014 2237   PHURINE 6.0 09/17/2014 2237   GLUCOSEU Negative 09/17/2014 2237   HGBUR 3+ 09/17/2014 2237   BILIRUBINUR Negative 09/17/2014 2237   KETONESUR Negative 09/17/2014 2237   PROTEINUR 100 mg/dL 09/17/2014 2237   NITRITE Negative 09/17/2014 2237   LEUKOCYTESUR Negative 09/17/2014 2237    Pertinent Imaging:None   Assessment & Plan:  Cystoscopy today reveals enlarged prostate no trabeculation the bladder. Posteriorly and the bladder there may be  either a tumor or area that appears to be bladder tumor but is probably secondary to long-term indwelling Foley. Patient does not want any surgery until he has his Medicare. The present time he has no insurance we plan prostate surgery and he still unable to void during voiding trial today. TURP and be done in January of February when he obtains his care which will occur this December. At that time the area was bladder should also be resected. Some posterior base of the bladder right about where the Foley catheter would rest but certainly appears so much like a bladder tumor and should probably be resected. He has no smoking history.  1. Gross hematuria patient.  - ciprofloxacin (CIPRO) tablet 500 mg; Take 1 tablet (500 mg total) by mouth once. - lidocaine (XYLOCAINE) 2 % jelly 1 application; Place 1 application into the urethra once.  2. Urinary retention  - ciprofloxacin (CIPRO) tablet 500 mg; Take 1 tablet (500 mg total) by mouth once. - lidocaine (XYLOCAINE) 2 % jelly 1 application; Place 1 application into the urethra once.  3. BPH (benign prostatic hyperplasia)   4. BPH with obstruction/lower urinary tract symptoms  - ciprofloxacin (CIPRO) tablet 500 mg; Take 1 tablet (500 mg total) by mouth once.   No Follow-up on file.  Collier Flowers, [facility] [address] [place], [place] [address] [phone]

## 2015-04-19 ENCOUNTER — Ambulatory Visit: Payer: Self-pay | Admitting: Urology

## 2015-07-20 ENCOUNTER — Encounter: Payer: Self-pay | Admitting: Urology

## 2015-07-20 ENCOUNTER — Ambulatory Visit (INDEPENDENT_AMBULATORY_CARE_PROVIDER_SITE_OTHER): Payer: Medicare Other | Admitting: Urology

## 2015-07-20 VITALS — BP 153/93 | HR 93 | Ht 74.0 in | Wt 224.8 lb

## 2015-07-20 DIAGNOSIS — N400 Enlarged prostate without lower urinary tract symptoms: Secondary | ICD-10-CM

## 2015-07-20 DIAGNOSIS — D494 Neoplasm of unspecified behavior of bladder: Secondary | ICD-10-CM

## 2015-07-20 DIAGNOSIS — R338 Other retention of urine: Principal | ICD-10-CM

## 2015-07-20 DIAGNOSIS — N401 Enlarged prostate with lower urinary tract symptoms: Secondary | ICD-10-CM

## 2015-07-20 LAB — URINALYSIS, COMPLETE
BILIRUBIN UA: NEGATIVE
Glucose, UA: NEGATIVE
KETONES UA: NEGATIVE
Nitrite, UA: POSITIVE — AB
SPEC GRAV UA: 1.02 (ref 1.005–1.030)
Urobilinogen, Ur: 1 mg/dL (ref 0.2–1.0)
pH, UA: 5.5 (ref 5.0–7.5)

## 2015-07-20 LAB — MICROSCOPIC EXAMINATION

## 2015-07-20 LAB — BLADDER SCAN AMB NON-IMAGING: Scan Result: 22

## 2015-07-20 NOTE — Addendum Note (Signed)
Addended by: Wilson Singer on: 07/20/2015 10:15 AM   Modules accepted: Orders

## 2015-07-20 NOTE — Progress Notes (Signed)
07/20/2015 9:25 AM   Karie Fetch December 27, 1950 [id number]  Referring provider: No referring provider defined for this encounter.  Chief Complaint  Patient presents with  . Follow-up    discuss surgery    HPI: The patient is a 65 year old gentleman with a past medical history of urinary retention dependent on Foley catheter who presents for follow-up. He is currently taking finasteride and Flomax. He has failed multiple trials of void. He recently underwent cystoscopy with Dr. Elnoria Howard which showed visually obstructive prostate. Also in the posterior wall of the bladder there was inflammation from the Foley versus bladder tumor. This was performed in November 2016. He waited until now for follow-up as he was waiting for his Medicare to start. He still having BPH with lower urinary tract symptoms. He has urinary frequency, nocturia. He is having to catheterize himself 2 times per week. His urinalysis today is concerning for infection, however he has no symptoms.   PMH: Past Medical History  Diagnosis Date  . HTN (hypertension)   . Acute cystitis with hematuria   . Weight loss   . Urinary retention   . UTI symptoms   . Urinary catheter insertion/adjustment/removal     Surgical History: Past Surgical History  Procedure Laterality Date  . No past surgeries      Home Medications:    Medication List       This list is accurate as of: 07/20/15  9:25 AM.  Always use your most recent med list.               amLODipine 10 MG tablet  Commonly known as:  NORVASC  Take 10 mg by mouth daily.     finasteride 5 MG tablet  Commonly known as:  PROSCAR  Take 5 mg by mouth daily. Reported on 07/20/2015     metoprolol tartrate 25 MG tablet  Commonly known as:  LOPRESSOR  Take 25 mg by mouth 2 (two) times daily. Reported on 07/20/2015     tamsulosin 0.4 MG Caps capsule  Commonly known as:  FLOMAX  Take 0.4 mg by mouth daily.        Allergies: No Known Allergies  Family  History: Family History  Problem Relation Age of Onset  . Cancer Father   . Heart disease Mother   . Kidney disease Neg Hx   . Prostate cancer Neg Hx     Social History:  reports that he has never smoked. He does not have any smokeless tobacco history on file. He reports that he drinks alcohol. He reports that he does not use illicit drugs.  ROS: UROLOGY Frequent Urination?: No Hard to postpone urination?: No Burning/pain with urination?: No Get up at night to urinate?: No Leakage of urine?: No Urine stream starts and stops?: No Trouble starting stream?: No Do you have to strain to urinate?: No Blood in urine?: No Urinary tract infection?: No Sexually transmitted disease?: No Injury to kidneys or bladder?: No Painful intercourse?: No Weak stream?: No Erection problems?: No Penile pain?: No  Gastrointestinal Nausea?: No Vomiting?: No Indigestion/heartburn?: No Diarrhea?: No Constipation?: No  Constitutional Fever: No Night sweats?: No Weight loss?: No Fatigue?: No  Skin Skin rash/lesions?: No Itching?: No  Eyes Blurred vision?: No Double vision?: No  Ears/Nose/Throat Sore throat?: No Sinus problems?: No  Hematologic/Lymphatic Swollen glands?: No Easy bruising?: No  Cardiovascular Leg swelling?: No Chest pain?: No  Respiratory Cough?: No Shortness of breath?: No  Endocrine Excessive thirst?: No  Musculoskeletal Back  pain?: No Joint pain?: No  Neurological Headaches?: No Dizziness?: No  Psychologic Depression?: No Anxiety?: No  Physical Exam: BP 153/93 mmHg  Pulse 93  Ht 6\' 2"  (1.88 m)  Wt 224 lb 12.8 oz (101.969 kg)  BMI 28.85 kg/m2  Constitutional:  Alert and oriented, No acute distress. HEENT: [place] AT, moist mucus membranes.  Trachea midline, no masses. Cardiovascular: No clubbing, cyanosis, or edema. Respiratory: Normal respiratory effort, no increased work of breathing. GI: Abdomen is soft, nontender, nondistended, no  abdominal masses GU: No CVA tenderness.  Skin: No rashes, bruises or suspicious lesions. Lymph: No cervical or inguinal adenopathy. Neurologic: Grossly intact, no focal deficits, moving all 4 extremities. Psychiatric: Normal mood and affect.  Laboratory Data: Lab Results  Component Value Date   WBC 13.1* 09/16/2014   HGB 15.9 09/16/2014   HCT 45.7 09/16/2014   MCV 95 09/16/2014   PLT 151 09/16/2014    Lab Results  Component Value Date   CREATININE 1.30* 09/16/2014    No results found for: PSA  No results found for: TESTOSTERONE  No results found for: HGBA1C  Urinalysis    Component Value Date/Time   COLORURINE Amber 09/17/2014 2237   APPEARANCEUR Hazy 09/17/2014 2237   LABSPEC 1.014 09/17/2014 2237   PHURINE 6.0 09/17/2014 2237   GLUCOSEU Negative 09/17/2014 2237   HGBUR 3+ 09/17/2014 2237   BILIRUBINUR Negative 09/17/2014 2237   KETONESUR Negative 09/17/2014 2237   PROTEINUR 100 mg/dL 09/17/2014 2237   NITRITE Negative 09/17/2014 2237   LEUKOCYTESUR Negative 09/17/2014 2237     Assessment & Plan:   1. BPH The patient has failed medical treatment of his benign prostatic hyperplasia. He is interested in undergoing transurethral resection of the prostate at this time. We discussed the procedure in great detail. He understands the risks, benefits and indications of this procedure. He understands that he will be admitted to the hospital for at least 1 night with a Foley catheter. He may or may not go home with the Foley catheter. He also understands the risks of infection and bleeding. He also has a possible bladder tumor though this may have been related to inflammation from his Foley catheter. We will evaluate his bladder that time and perform possible TURBT. He does understand if the tumor is large in his bladder Juleen China in the process that we will have to cancel the TURP. He also understands that this may result in a Foley catheter after the procedure. He is agreeable  to proceed.  -check urine culture today. Treat prn -TURP/possible TURBT  2. Bladder tumor As above  No Follow-up on file.  [name], MD  [facility] [address], [place], [place] [phone]

## 2015-07-20 NOTE — Progress Notes (Signed)
Bladder Scan Patient void: 84ml Performed By: Larna Daughters

## 2015-07-21 ENCOUNTER — Telehealth: Payer: Self-pay | Admitting: Radiology

## 2015-07-21 NOTE — Telephone Encounter (Signed)
Pt notified of surgery scheduled 08/17/15, pre-admit testing appt on 2/24 @8 :15 and to call day prior to surgery for arrival time to SDS. Pt voices understanding.

## 2015-07-22 ENCOUNTER — Telehealth: Payer: Self-pay

## 2015-07-22 DIAGNOSIS — N390 Urinary tract infection, site not specified: Secondary | ICD-10-CM

## 2015-07-22 LAB — CULTURE, URINE COMPREHENSIVE

## 2015-07-22 MED ORDER — SULFAMETHOXAZOLE-TRIMETHOPRIM 800-160 MG PO TABS: 1.0000 | ORAL_TABLET | Freq: Two times a day (BID) | ORAL | Status: AC

## 2015-07-22 NOTE — Telephone Encounter (Signed)
-----   Message from [name], MD sent at 07/22/2015  1:53 PM EST ----- Can we start the patient on Bactrim DS BID for 7 days. thanks

## 2015-07-22 NOTE — Telephone Encounter (Signed)
Spoke with pt in reference to +ucx. Made pt aware medication has been sent to pharmacy. Pt voiced understanding.

## 2015-08-05 ENCOUNTER — Encounter
Admission: RE | Admit: 2015-08-05 | Discharge: 2015-08-05 | Disposition: A | Discharge status: Home / Self Care | Payer: Medicare Other | Source: Ambulatory Visit | Attending: Urology | Admitting: Urology

## 2015-08-05 DIAGNOSIS — N400 Enlarged prostate without lower urinary tract symptoms: Secondary | ICD-10-CM | POA: Insufficient documentation

## 2015-08-05 DIAGNOSIS — I10 Essential (primary) hypertension: Secondary | ICD-10-CM | POA: Diagnosis not present

## 2015-08-05 DIAGNOSIS — N390 Urinary tract infection, site not specified: Secondary | ICD-10-CM | POA: Diagnosis not present

## 2015-08-05 DIAGNOSIS — Z01810 Encounter for preprocedural cardiovascular examination: Secondary | ICD-10-CM | POA: Diagnosis not present

## 2015-08-05 DIAGNOSIS — Z01812 Encounter for preprocedural laboratory examination: Secondary | ICD-10-CM | POA: Insufficient documentation

## 2015-08-05 DIAGNOSIS — Z960 Presence of urogenital implants: Secondary | ICD-10-CM | POA: Diagnosis not present

## 2015-08-05 HISTORY — DX: Urinary tract infection, site not specified: N39.0

## 2015-08-05 HISTORY — DX: Benign prostatic hyperplasia without lower urinary tract symptoms: N40.0

## 2015-08-05 LAB — BASIC METABOLIC PANEL
ANION GAP: 7 (ref 5–15)
BUN: 10 mg/dL (ref 6–20)
CALCIUM: 9.5 mg/dL (ref 8.9–10.3)
CHLORIDE: 102 mmol/L (ref 101–111)
CO2: 28 mmol/L (ref 22–32)
Creatinine, Ser: 1.23 mg/dL (ref 0.61–1.24)
GFR calc non Af Amer: 60 mL/min — ABNORMAL LOW (ref >60)
GLUCOSE: 123 mg/dL — AB (ref 65–99)
POTASSIUM: 3.4 mmol/L — AB (ref 3.5–5.1)
Sodium: 137 mmol/L (ref 135–145)

## 2015-08-05 LAB — CBC WITH DIFFERENTIAL/PLATELET
BASOS ABS: 0 10*3/uL (ref 0–0.1)
Eosinophils Absolute: 0.3 10*3/uL (ref 0–0.7)
Eosinophils Relative: 7 %
HEMATOCRIT: 47 % (ref 40.0–52.0)
HEMOGLOBIN: 16.3 g/dL (ref 13.0–18.0)
Lymphs Abs: 2.1 10*3/uL (ref 1.0–3.6)
MCH: 32 pg (ref 26.0–34.0)
MCHC: 34.7 g/dL (ref 32.0–36.0)
MCV: 92.3 fL (ref 80.0–100.0)
MONO ABS: 0.3 10*3/uL (ref 0.2–1.0)
Monocytes Relative: 6 %
NEUTROS ABS: 1.9 10*3/uL (ref 1.4–6.5)
Platelets: 99 10*3/uL — ABNORMAL LOW (ref 150–440)
RBC: 5.09 MIL/uL (ref 4.40–5.90)
RDW: 12.8 % (ref 11.5–14.5)
WBC: 4.7 10*3/uL (ref 3.8–10.6)

## 2015-08-05 LAB — PROTIME-INR
INR: 1.15
Prothrombin Time: 14.9 seconds (ref 11.4–15.0)

## 2015-08-05 LAB — APTT: APTT: 37 s — AB (ref 24–36)

## 2015-08-05 NOTE — Patient Instructions (Signed)
  Your procedure is scheduled on: 08/17/15 Wed Report to Day Surgery.2nd floor medical mall To find out your arrival time please call [phone] between 1PM - 3PM on 08/16/15 Tues.  Remember: Instructions that are not followed completely may result in serious medical risk, up to and including death, or upon the discretion of your surgeon and anesthesiologist your surgery may need to be rescheduled.    _x___ 1. Do not eat food or drink liquids after midnight. No gum chewing or hard candies.     _x___ 2. No Alcohol for 24 hours before or after surgery.   _x___ 3. Bring all medications with you on the day of surgery if instructed.    __x__ 4. Notify your doctor if there is any change in your medical condition     (cold, fever, infections).     Do not wear jewelry, make-up, hairpins, clips or nail polish.  Do not wear lotions, powders, or perfumes. You may wear deodorant.  Do not shave 48 hours prior to surgery. Men may shave face and neck.  Do not bring valuables to the hospital.    [facility] is not responsible for any belongings or valuables.               Contacts, dentures or bridgework may not be worn into surgery.  Leave your suitcase in the car. After surgery it may be brought to your room.  For patients admitted to the hospital, discharge time is determined by your                treatment team.   Patients discharged the day of surgery will not be allowed to drive home.   Please read over the following fact sheets that you were given:      _x___ Take these medicines the morning of surgery with A SIP OF WATER:    1.amLODipine (NORVASC) 10 MG tablet  2. metoprolol tartrate (LOPRESSOR) 25 MG tablet  3.   4.  5.  6.  ____ Fleet Enema (as directed)   __x__ Use CHG Soap as directed  ____ Use inhalers on the day of surgery  ____ Stop metformin 2 days prior to surgery    ____ Take 1/2 of usual insulin dose the night before surgery and none on the morning of surgery.    ____ Stop Coumadin/Plavix/aspirin on   _x___ Stop Anti-inflammatories on No aspirin or ibuprofen 1 week before surgery, may take Tylenol as needed   ____ Stop supplements until after surgery.    ____ Bring C-Pap to the hospital.

## 2015-08-06 NOTE — Pre-Procedure Instructions (Signed)
EKG sent to anesthesia for review

## 2015-08-07 LAB — URINE CULTURE: Culture: NO GROWTH

## 2015-08-08 NOTE — Pre-Procedure Instructions (Signed)
EKG NOTED. 

## 2015-08-17 ENCOUNTER — Encounter
Admission: RE | Disposition: A | Discharge status: Home / Self Care | Payer: Self-pay | Source: Ambulatory Visit | Attending: Urology

## 2015-08-17 ENCOUNTER — Encounter: Payer: Self-pay | Admitting: *Deleted

## 2015-08-17 ENCOUNTER — Ambulatory Visit: Payer: Medicare Other | Admitting: Anesthesiology

## 2015-08-17 ENCOUNTER — Observation Stay
Admission: RE | Admit: 2015-08-17 | Discharge: 2015-08-21 | Disposition: A | Discharge status: Home / Self Care | Payer: Medicare Other | Source: Ambulatory Visit | Attending: Urology | Admitting: Urology

## 2015-08-17 DIAGNOSIS — Z8249 Family history of ischemic heart disease and other diseases of the circulatory system: Secondary | ICD-10-CM | POA: Insufficient documentation

## 2015-08-17 DIAGNOSIS — N138 Other obstructive and reflux uropathy: Secondary | ICD-10-CM | POA: Diagnosis not present

## 2015-08-17 DIAGNOSIS — D62 Acute posthemorrhagic anemia: Secondary | ICD-10-CM | POA: Insufficient documentation

## 2015-08-17 DIAGNOSIS — R339 Retention of urine, unspecified: Secondary | ICD-10-CM

## 2015-08-17 DIAGNOSIS — R350 Frequency of micturition: Secondary | ICD-10-CM | POA: Diagnosis not present

## 2015-08-17 DIAGNOSIS — R338 Other retention of urine: Secondary | ICD-10-CM | POA: Insufficient documentation

## 2015-08-17 DIAGNOSIS — R351 Nocturia: Secondary | ICD-10-CM | POA: Diagnosis not present

## 2015-08-17 DIAGNOSIS — I10 Essential (primary) hypertension: Secondary | ICD-10-CM | POA: Diagnosis not present

## 2015-08-17 DIAGNOSIS — N400 Enlarged prostate without lower urinary tract symptoms: Secondary | ICD-10-CM | POA: Diagnosis present

## 2015-08-17 DIAGNOSIS — R310 Gross hematuria: Secondary | ICD-10-CM | POA: Insufficient documentation

## 2015-08-17 DIAGNOSIS — N401 Enlarged prostate with lower urinary tract symptoms: Secondary | ICD-10-CM | POA: Diagnosis present

## 2015-08-17 DIAGNOSIS — Z79899 Other long term (current) drug therapy: Secondary | ICD-10-CM | POA: Diagnosis not present

## 2015-08-17 HISTORY — PX: TRANSURETHRAL RESECTION OF BLADDER TUMOR: SHX2575

## 2015-08-17 HISTORY — PX: TRANSURETHRAL RESECTION OF PROSTATE: SHX73

## 2015-08-17 SURGERY — TURP (TRANSURETHRAL RESECTION OF PROSTATE)
Anesthesia: General | Wound class: Clean Contaminated

## 2015-08-17 MED ORDER — SODIUM CHLORIDE 0.9 % IV SOLN
2.0000 g | Freq: Once | INTRAVENOUS | Status: AC
  Administered 2015-08-17: 2 g via INTRAVENOUS
  Filled 2015-08-17: qty 2000

## 2015-08-17 MED ORDER — SODIUM CHLORIDE 0.9 % IV SOLN
INTRAVENOUS | Status: DC
  Administered 2015-08-17 – 2015-08-20 (×7): via INTRAVENOUS
  Administered 2015-08-20: 1000 mL via INTRAVENOUS
  Administered 2015-08-21: via INTRAVENOUS

## 2015-08-17 MED ORDER — ONDANSETRON HCL 4 MG/2ML IJ SOLN: 4.0000 mg | Freq: Once | INTRAMUSCULAR | Status: DC | PRN

## 2015-08-17 MED ORDER — LIDOCAINE HCL (CARDIAC) 20 MG/ML IV SOLN
INTRAVENOUS | Status: DC | PRN
  Administered 2015-08-17: 60 mg via INTRAVENOUS

## 2015-08-17 MED ORDER — GENTAMICIN SULFATE 40 MG/ML IJ SOLN
120.0000 mg | Freq: Once | INTRAVENOUS | Status: DC
  Filled 2015-08-17: qty 3

## 2015-08-17 MED ORDER — FAMOTIDINE 20 MG PO TABS
ORAL_TABLET | ORAL | Status: AC
  Administered 2015-08-17: 20 mg via ORAL
  Filled 2015-08-17: qty 1

## 2015-08-17 MED ORDER — FAMOTIDINE 20 MG PO TABS
20.0000 mg | ORAL_TABLET | Freq: Once | ORAL | Status: AC
  Administered 2015-08-17: 20 mg via ORAL

## 2015-08-17 MED ORDER — FENTANYL CITRATE (PF) 100 MCG/2ML IJ SOLN
INTRAMUSCULAR | Status: DC | PRN
Start: 2015-08-17 — End: 2015-08-17
  Administered 2015-08-17 (×4): 25 ug via INTRAVENOUS

## 2015-08-17 MED ORDER — HEPARIN SODIUM (PORCINE) 5000 UNIT/ML IJ SOLN
5000.0000 [IU] | Freq: Three times a day (TID) | INTRAMUSCULAR | Status: DC
  Administered 2015-08-17 – 2015-08-18 (×3): 5000 [IU] via SUBCUTANEOUS
  Filled 2015-08-17 (×3): qty 1

## 2015-08-17 MED ORDER — ONDANSETRON HCL 4 MG/2ML IJ SOLN
INTRAMUSCULAR | Status: DC | PRN
  Administered 2015-08-17: 4 mg via INTRAVENOUS

## 2015-08-17 MED ORDER — PROPOFOL 10 MG/ML IV BOLUS
INTRAVENOUS | Status: DC | PRN
  Administered 2015-08-17: 200 mg via INTRAVENOUS

## 2015-08-17 MED ORDER — LACTATED RINGERS IV SOLN
INTRAVENOUS | Status: DC
  Administered 2015-08-17: 12:00:00 via INTRAVENOUS

## 2015-08-17 MED ORDER — AMLODIPINE BESYLATE 10 MG PO TABS
10.0000 mg | ORAL_TABLET | Freq: Every day | ORAL | Status: DC
  Administered 2015-08-17 – 2015-08-21 (×4): 10 mg via ORAL
  Filled 2015-08-17 (×4): qty 1

## 2015-08-17 MED ORDER — MIDAZOLAM HCL 5 MG/5ML IJ SOLN
INTRAMUSCULAR | Status: AC
  Administered 2015-08-17: 1 mg
  Filled 2015-08-17: qty 5

## 2015-08-17 MED ORDER — SODIUM CHLORIDE 0.9 % IR SOLN
3000.0000 mL | Status: DC
  Administered 2015-08-17 (×2): 3000 mL

## 2015-08-17 MED ORDER — FINASTERIDE 5 MG PO TABS
5.0000 mg | ORAL_TABLET | Freq: Every day | ORAL | Status: DC
  Administered 2015-08-17 – 2015-08-20 (×4): 5 mg via ORAL
  Filled 2015-08-17 (×5): qty 1

## 2015-08-17 MED ORDER — HYDROCODONE-ACETAMINOPHEN 5-325 MG PO TABS
1.0000 | ORAL_TABLET | ORAL | Status: DC | PRN
  Administered 2015-08-17 – 2015-08-18 (×2): 2 via ORAL
  Administered 2015-08-19: 1 via ORAL
  Administered 2015-08-20: 2 via ORAL
  Administered 2015-08-21: 1 via ORAL
  Filled 2015-08-17 (×2): qty 2
  Filled 2015-08-17: qty 1
  Filled 2015-08-17 (×2): qty 2

## 2015-08-17 MED ORDER — HYDROMORPHONE HCL 1 MG/ML IJ SOLN
INTRAMUSCULAR | Status: AC
  Filled 2015-08-17: qty 1

## 2015-08-17 MED ORDER — MIDAZOLAM HCL 5 MG/ML IJ SOLN
1.0000 mg | Freq: Once | INTRAMUSCULAR | Status: AC
  Administered 2015-08-17: 1 mg via INTRAVENOUS

## 2015-08-17 MED ORDER — MIDAZOLAM HCL 5 MG/5ML IJ SOLN
INTRAMUSCULAR | Status: DC | PRN
  Administered 2015-08-17: 2 mg via INTRAVENOUS

## 2015-08-17 MED ORDER — CEFAZOLIN SODIUM 1-5 GM-% IV SOLN
1.0000 g | Freq: Three times a day (TID) | INTRAVENOUS | Status: AC
  Administered 2015-08-17 – 2015-08-18 (×2): 1 g via INTRAVENOUS
  Filled 2015-08-17 (×3): qty 50

## 2015-08-17 MED ORDER — TAMSULOSIN HCL 0.4 MG PO CAPS
0.4000 mg | ORAL_CAPSULE | Freq: Every day | ORAL | Status: DC
  Administered 2015-08-17 – 2015-08-21 (×5): 0.4 mg via ORAL
  Filled 2015-08-17 (×6): qty 1

## 2015-08-17 MED ORDER — HYDROMORPHONE HCL 1 MG/ML IJ SOLN
0.2500 mg | INTRAMUSCULAR | Status: DC | PRN
  Administered 2015-08-17 (×2): 0.5 mg via INTRAVENOUS

## 2015-08-17 MED ORDER — ONDANSETRON HCL 4 MG/2ML IJ SOLN: 4.0000 mg | INTRAMUSCULAR | Status: DC | PRN

## 2015-08-17 MED ORDER — METOPROLOL TARTRATE 25 MG PO TABS
25.0000 mg | ORAL_TABLET | Freq: Two times a day (BID) | ORAL | Status: DC
  Administered 2015-08-17 – 2015-08-20 (×6): 25 mg via ORAL
  Filled 2015-08-17 (×7): qty 1

## 2015-08-17 MED ORDER — MORPHINE SULFATE (PF) 2 MG/ML IV SOLN
2.0000 mg | INTRAVENOUS | Status: DC | PRN
  Administered 2015-08-18: 2 mg via INTRAVENOUS
  Filled 2015-08-17: qty 1

## 2015-08-17 SURGICAL SUPPLY — 32 items
BACTOSHIELD CHG 4% 4OZ (MISCELLANEOUS) ×2
BAG DRAIN CYSTO-URO LG1000N (MISCELLANEOUS) ×3 IMPLANT
BAG URO DRAIN 2000ML W/SPOUT (MISCELLANEOUS) ×3 IMPLANT
BAG URO DRAIN 4000ML (MISCELLANEOUS) ×3 IMPLANT
CATH FOL 3WAY LX COUV 24X75 (CATHETERS) ×3 IMPLANT
CATH FOLEY 2WAY  5CC 16FR (CATHETERS) ×2
CATH FOLEY 3WAY 30CC 24FR (CATHETERS) ×2
CATH URTH 16FR FL 2W BLN LF (CATHETERS) ×1 IMPLANT
CATH URTH STD 24FR FL 3W 2 (CATHETERS) ×1 IMPLANT
CORD URO TURP 10FT (MISCELLANEOUS) ×3 IMPLANT
ELECT LOOP 22F BIPOLAR SML (ELECTROSURGICAL) ×3
ELECT REM PT RETURN 9FT ADLT (ELECTROSURGICAL) ×3
ELECTRODE LOOP 22F BIPOLAR SML (ELECTROSURGICAL) ×1 IMPLANT
ELECTRODE REM PT RTRN 9FT ADLT (ELECTROSURGICAL) ×1 IMPLANT
GLOVE BIO SURGEON STRL SZ7 (GLOVE) ×6 IMPLANT
GLOVE BIO SURGEON STRL SZ7.5 (GLOVE) ×3 IMPLANT
GOWN STRL REUS W/ TWL LRG LVL3 (GOWN DISPOSABLE) ×2 IMPLANT
GOWN STRL REUS W/TWL LRG LVL3 (GOWN DISPOSABLE) ×4
IV NS 1000ML (IV SOLUTION) ×10
IV NS 1000ML BAXH (IV SOLUTION) ×5 IMPLANT
IV NS IRRIG 3000ML ARTHROMATIC (IV SOLUTION) ×33 IMPLANT
KIT RM TURNOVER CYSTO AR (KITS) ×3 IMPLANT
LOOP CUT BIPOLAR 24F LRG (ELECTROSURGICAL) ×3 IMPLANT
PACK CYSTO AR (MISCELLANEOUS) ×3 IMPLANT
PREP PVP WINGED SPONGE (MISCELLANEOUS) ×3 IMPLANT
SCRUB CHG 4% DYNA-HEX 4OZ (MISCELLANEOUS) ×1 IMPLANT
SET IRRIG Y TYPE TUR BLADDER L (SET/KITS/TRAYS/PACK) ×3 IMPLANT
SOL .9 NS 3000ML IRR  AL (IV SOLUTION) ×8
SOL .9 NS 3000ML IRR UROMATIC (IV SOLUTION) ×4 IMPLANT
SURGILUBE 2OZ TUBE FLIPTOP (MISCELLANEOUS) ×3 IMPLANT
SYRINGE IRR TOOMEY STRL 70CC (SYRINGE) ×3 IMPLANT
WATER STERILE IRR 1000ML POUR (IV SOLUTION) ×3 IMPLANT

## 2015-08-17 NOTE — Anesthesia Postprocedure Evaluation (Signed)
Anesthesia Post Note  Patient: KASRA HELLBERG  Procedure(s) Performed: Procedure(s) (LRB): TRANSURETHRAL RESECTION OF THE PROSTATE (TURP) (N/A) TRANSURETHRAL RESECTION OF BLADDER TUMOR (TURBT) (N/A)  Patient location during evaluation: PACU Anesthesia Type: General Level of consciousness: awake Pain management: pain level controlled Vital Signs Assessment: post-procedure vital signs reviewed and stable Cardiovascular status: blood pressure returned to baseline Anesthetic complications: no    Last Vitals:  Filed Vitals:   08/17/15 1620 08/17/15 1635  BP: 105/74 115/71  Pulse: 56 54  Temp:  36.3 C  Resp: 20 19    Last Pain:  Filed Vitals:   08/17/15 1636  PainSc: Asleep                 VAN STAVEREN,Azarius Lambson

## 2015-08-17 NOTE — Transfer of Care (Signed)
Immediate Anesthesia Transfer of Care Note  Patient: Daniel Knapp  Procedure(s) Performed: Procedure(s): TRANSURETHRAL RESECTION OF THE PROSTATE (TURP) (N/A) TRANSURETHRAL RESECTION OF BLADDER TUMOR (TURBT) (N/A)  Patient Location: PACU  Anesthesia Type:General  Level of Consciousness: sedated  Airway & Oxygen Therapy: Patient Spontanous Breathing and Patient connected to face mask oxygen  Post-op Assessment: Report given to RN  Post vital signs: Reviewed and stable  Last Vitals:  Filed Vitals:   08/17/15 1535  BP: 128/79  Pulse: 77  Temp: 36.5 C  Resp: 18    Complications: No apparent anesthesia complications

## 2015-08-17 NOTE — Progress Notes (Signed)
MEDICATION RELATED CONSULT NOTE - INITIAL   Pharmacy Consult for antibiotic renal adjustment   No Known Allergies  Patient Measurements:     Vital Signs: Temp: 97.4 F (36.3 C) (03/08 1712) Temp Source: Oral (03/08 1712) BP: 112/66 mmHg (03/08 1712) Pulse Rate: 62 (03/08 1712) Intake/Output from previous day:   Intake/Output from this shift: Total I/O In: 7730 [I.V.:1130; Other:6600] Out: 7850 [Urine:7850]  Labs: No results for input(s): WBC, HGB, HCT, PLT, APTT, CREATININE, LABCREA, CREATININE, CREAT24HRUR, MG, PHOS, ALBUMIN, PROT, ALBUMIN, AST, ALT, ALKPHOS, BILITOT, BILIDIR, IBILI in the last 72 hours. Estimated Creatinine Clearance: 77 mL/min (by C-G formula based on Cr of 1.23).   Microbiology: Recent Results (from the past 720 hour(s))  Microscopic Examination     Status: Abnormal   Collection Time: 07/20/15  9:17 AM  Result Value Ref Range Status   WBC, UA 11-30 (A) 0 -  5 /hpf Final   RBC, UA 3-10 (A) 0 -  2 /hpf Final   Epithelial Cells (non renal) 0-10 0 - 10 /hpf Final   Bacteria, UA Many (A) None seen/Few Final  CULTURE, URINE COMPREHENSIVE     Status: Abnormal   Collection Time: 07/20/15 10:35 AM  Result Value Ref Range Status   Urine Culture, Comprehensive Final report (A)  Final   Result 1 Serratia marcescens (A)  Final    Comment: Greater than 100,000 colony forming units per mL   ANTIMICROBIAL SUSCEPTIBILITY Comment  Final    Comment:       ** S = Susceptible; I = Intermediate; R = Resistant **                    P = Positive; N = Negative             MICS are expressed in micrograms per mL    Antibiotic                 RSLT#1    RSLT#2    RSLT#3    RSLT#4 Amoxicillin/Clavulanic Acid    R Cefazolin                      R Cefepime                       S Ceftriaxone                    S Cefuroxime                     R Cephalothin                    R Ciprofloxacin                  S Ertapenem                      S Gentamicin                      S Imipenem                       S Levofloxacin                   S Nitrofurantoin                 R Piperacillin  S Tetracycline                   R Tobramycin                     I Trimethoprim/Sulfa             S   Urine culture     Status: None   Collection Time: 08/05/15  8:26 AM  Result Value Ref Range Status   Specimen Description URINE, CLEAN CATCH  Final   Special Requests NONE  Final   Culture NO GROWTH 2 DAYS  Final   Report Status 08/07/2015 FINAL  Final    Medical History: Past Medical History  Diagnosis Date  . HTN (hypertension)   . Acute cystitis with hematuria   . Weight loss   . Urinary retention   . UTI symptoms   . Urinary catheter insertion/adjustment/removal   . Prostate enlargement   . UTI (lower urinary tract infection)     Medications:  Scheduled:  . amLODipine  10 mg Oral Daily  .  ceFAZolin (ANCEF) IV  1 g Intravenous Q8H  . finasteride  5 mg Oral Daily  . heparin subcutaneous  5,000 Units Subcutaneous 3 times per day  . HYDROmorphone      . metoprolol tartrate  25 mg Oral BID  . tamsulosin  0.4 mg Oral Daily   Infusions:  . sodium chloride 100 mL/hr at 08/17/15 1614  . sodium chloride irrigation      Assessment: Pharmacy consulted to renally adjust antibiotics in a 65 yo male.  Patient currently ordered Cefazolin 1 gm IV q8h x 2 doses for surgical prophylaxis.  Patient received Ampicillin 2 gm IV once Pre-Op.   Est CrCl~77 mL/min  Plan:  No adjustments needed at this time.    Raya Mckinstry G 08/17/2015,5:46 PM

## 2015-08-17 NOTE — OR Nursing (Signed)
Patient to PACU, confused and attempting to get out of the bed, c/o urge to urinate.  Contacted Dr. Izetta Dakin and he gave orders for Versed.

## 2015-08-17 NOTE — H&P (View-Only) (Signed)
07/20/2015 9:25 AM   Daniel Knapp Jul 12, 1950 [id number]  Referring provider: No referring provider defined for this encounter.  Chief Complaint  Patient presents with  . Follow-up    discuss surgery    HPI: The patient is a 65 year old gentleman with a past medical history of urinary retention dependent on Foley catheter who presents for follow-up. He is currently taking finasteride and Flomax. He has failed multiple trials of void. He recently underwent cystoscopy with Dr. Elnoria Howard which showed visually obstructive prostate. Also in the posterior wall of the bladder there was inflammation from the Foley versus bladder tumor. This was performed in November 2016. He waited until now for follow-up as he was waiting for his Medicare to start. He still having BPH with lower urinary tract symptoms. He has urinary frequency, nocturia. He is having to catheterize himself 2 times per week. His urinalysis today is concerning for infection, however he has no symptoms.   PMH: Past Medical History  Diagnosis Date  . HTN (hypertension)   . Acute cystitis with hematuria   . Weight loss   . Urinary retention   . UTI symptoms   . Urinary catheter insertion/adjustment/removal     Surgical History: Past Surgical History  Procedure Laterality Date  . No past surgeries      Home Medications:    Medication List       This list is accurate as of: 07/20/15  9:25 AM.  Always use your most recent med list.               amLODipine 10 MG tablet  Commonly known as:  NORVASC  Take 10 mg by mouth daily.     finasteride 5 MG tablet  Commonly known as:  PROSCAR  Take 5 mg by mouth daily. Reported on 07/20/2015     metoprolol tartrate 25 MG tablet  Commonly known as:  LOPRESSOR  Take 25 mg by mouth 2 (two) times daily. Reported on 07/20/2015     tamsulosin 0.4 MG Caps capsule  Commonly known as:  FLOMAX  Take 0.4 mg by mouth daily.        Allergies: No Known Allergies  Family  History: Family History  Problem Relation Age of Onset  . Cancer Father   . Heart disease Mother   . Kidney disease Neg Hx   . Prostate cancer Neg Hx     Social History:  reports that he has never smoked. He does not have any smokeless tobacco history on file. He reports that he drinks alcohol. He reports that he does not use illicit drugs.  ROS: UROLOGY Frequent Urination?: No Hard to postpone urination?: No Burning/pain with urination?: No Get up at night to urinate?: No Leakage of urine?: No Urine stream starts and stops?: No Trouble starting stream?: No Do you have to strain to urinate?: No Blood in urine?: No Urinary tract infection?: No Sexually transmitted disease?: No Injury to kidneys or bladder?: No Painful intercourse?: No Weak stream?: No Erection problems?: No Penile pain?: No  Gastrointestinal Nausea?: No Vomiting?: No Indigestion/heartburn?: No Diarrhea?: No Constipation?: No  Constitutional Fever: No Night sweats?: No Weight loss?: No Fatigue?: No  Skin Skin rash/lesions?: No Itching?: No  Eyes Blurred vision?: No Double vision?: No  Ears/Nose/Throat Sore throat?: No Sinus problems?: No  Hematologic/Lymphatic Swollen glands?: No Easy bruising?: No  Cardiovascular Leg swelling?: No Chest pain?: No  Respiratory Cough?: No Shortness of breath?: No  Endocrine Excessive thirst?: No  Musculoskeletal Back  pain?: No Joint pain?: No  Neurological Headaches?: No Dizziness?: No  Psychologic Depression?: No Anxiety?: No  Physical Exam: BP 153/93 mmHg  Pulse 93  Ht 6\' 2"  (1.88 m)  Wt 224 lb 12.8 oz (101.969 kg)  BMI 28.85 kg/m2  Constitutional:  Alert and oriented, No acute distress. HEENT: [place] AT, moist mucus membranes.  Trachea midline, no masses. Cardiovascular: No clubbing, cyanosis, or edema. Respiratory: Normal respiratory effort, no increased work of breathing. GI: Abdomen is soft, nontender, nondistended, no  abdominal masses GU: No CVA tenderness.  Skin: No rashes, bruises or suspicious lesions. Lymph: No cervical or inguinal adenopathy. Neurologic: Grossly intact, no focal deficits, moving all 4 extremities. Psychiatric: Normal mood and affect.  Laboratory Data: Lab Results  Component Value Date   WBC 13.1* 09/16/2014   HGB 15.9 09/16/2014   HCT 45.7 09/16/2014   MCV 95 09/16/2014   PLT 151 09/16/2014    Lab Results  Component Value Date   CREATININE 1.30* 09/16/2014    No results found for: PSA  No results found for: TESTOSTERONE  No results found for: HGBA1C  Urinalysis    Component Value Date/Time   COLORURINE Amber 09/17/2014 2237   APPEARANCEUR Hazy 09/17/2014 2237   LABSPEC 1.014 09/17/2014 2237   PHURINE 6.0 09/17/2014 2237   GLUCOSEU Negative 09/17/2014 2237   HGBUR 3+ 09/17/2014 2237   BILIRUBINUR Negative 09/17/2014 2237   KETONESUR Negative 09/17/2014 2237   PROTEINUR 100 mg/dL 09/17/2014 2237   NITRITE Negative 09/17/2014 2237   LEUKOCYTESUR Negative 09/17/2014 2237     Assessment & Plan:   1. BPH The patient has failed medical treatment of his benign prostatic hyperplasia. He is interested in undergoing transurethral resection of the prostate at this time. We discussed the procedure in great detail. He understands the risks, benefits and indications of this procedure. He understands that he will be admitted to the hospital for at least 1 night with a Foley catheter. He may or may not go home with the Foley catheter. He also understands the risks of infection and bleeding. He also has a possible bladder tumor though this may have been related to inflammation from his Foley catheter. We will evaluate his bladder that time and perform possible TURBT. He does understand if the tumor is large in his bladder Juleen China in the process that we will have to cancel the TURP. He also understands that this may result in a Foley catheter after the procedure. He is agreeable  to proceed.  -check urine culture today. Treat prn -TURP/possible TURBT  2. Bladder tumor As above  No Follow-up on file.  [name], MD  [facility] [address], [place], [place] [phone]

## 2015-08-17 NOTE — Anesthesia Preprocedure Evaluation (Signed)
Anesthesia Evaluation  Patient identified by MRN, date of birth, ID band Patient awake    Reviewed: Allergy & Precautions, NPO status , Patient's Chart, lab work & pertinent test results  Airway Mallampati: I  TM Distance: >3 FB Neck ROM: Full    Dental  (+) Upper Dentures   Pulmonary neg pulmonary ROS,    Pulmonary exam normal        Cardiovascular Exercise Tolerance: Good hypertension, Pt. on medications and Pt. on home beta blockers Normal cardiovascular exam     Neuro/Psych negative neurological ROS     GI/Hepatic negative GI ROS, Neg liver ROS,   Endo/Other  negative endocrine ROS  Renal/GU      Musculoskeletal   Abdominal Normal abdominal exam  (+)   Peds  Hematology   Anesthesia Other Findings   Reproductive/Obstetrics                             Anesthesia Physical Anesthesia Plan  ASA: II  Anesthesia Plan: General   Post-op Pain Management:    Induction: Intravenous  Airway Management Planned: LMA  Additional Equipment:   Intra-op Plan:   Post-operative Plan: Extubation in OR  Informed Consent: I have reviewed the patients History and Physical, chart, labs and discussed the procedure including the risks, benefits and alternatives for the proposed anesthesia with the patient or authorized representative who has indicated his/her understanding and acceptance.     Plan Discussed with:   Anesthesia Plan Comments:         Anesthesia Quick Evaluation

## 2015-08-17 NOTE — Anesthesia Procedure Notes (Signed)
Procedure Name: LMA Insertion Date/Time: 08/17/2015 1:37 PM Performed by: Dionne Bucy Pre-anesthesia Checklist: Patient identified, Patient being monitored, Timeout performed, Emergency Drugs available and Suction available Patient Re-evaluated:Patient Re-evaluated prior to inductionOxygen Delivery Method: Circle system utilized Preoxygenation: Pre-oxygenation with 100% oxygen Intubation Type: IV induction Ventilation: Mask ventilation without difficulty LMA: LMA inserted LMA Size: 4.5 Tube type: Oral Number of attempts: 1 Placement Confirmation: positive ETCO2 and breath sounds checked- equal and bilateral Tube secured with: Tape Dental Injury: Teeth and Oropharynx as per pre-operative assessment

## 2015-08-17 NOTE — Interval H&P Note (Signed)
History and Physical Interval Note:  08/17/2015 11:27 AM  Daniel Knapp  has presented today for surgery, with the diagnosis of BPH,BLADDER TUMOR  The various methods of treatment have been discussed with the patient and family. After consideration of risks, benefits and other options for treatment, the patient has consented to  Procedure(s): TRANSURETHRAL RESECTION OF THE PROSTATE (TURP) (N/A) TRANSURETHRAL RESECTION OF BLADDER TUMOR (TURBT) (N/A) as a surgical intervention .  The patient's history has been reviewed, patient examined, no change in status, stable for surgery.  I have reviewed the patient's chart and labs.  Questions were answered to the patient's satisfaction.     [name]

## 2015-08-17 NOTE — Op Note (Signed)
Date of procedure: 08/17/2015  Preoperative diagnosis:  1.  BPH  Postoperative diagnosis:  1. BPH   Procedure: 1. Transurethral resection of prostate  Surgeon: Baruch Gouty, MD  Anesthesia: General  Complications: None  Intraoperative findings: The patient had a large obstructive prostate that was very vascular with multiple venous sinuses. This was resected until visibly unobstructed. There was some small amount of bladder neck undermining noted.  EBL: Minimal  Specimens: Prostate chips  Drains: 24 French silicone catheter on continuous bladder irrigation  Disposition: Stable to the postanesthesia care unit  Indication for procedure: The patient is a 65 y.o. male with the visually obstructive prostate and BPH she was failed to medical therapy and now presents for definitive surgical management.  After reviewing the management options for treatment, the patient elected to proceed with the above surgical procedure(s). We have discussed the potential benefits and risks of the procedure, side effects of the proposed treatment, the likelihood of the patient achieving the goals of the procedure, and any potential problems that might occur during the procedure or recuperation. Informed consent has been obtained.  Description of procedure: The patient was met in the preoperative area. All risks, benefits, and indications of the procedure were described in great detail. The patient consented to the procedure. Preoperative antibiotics were given. The patient was taken to the operative theater. General anesthesia was induced per the anesthesia service. The patient was then placed in the dorsal lithotomy position and prepped and draped in the usual sterile fashion. A preoperative timeout was called.   A 24 French 30 resectoscope with visual operator was inserted in the patient's bladder per urethra atraumatically. The prostate was noted to be hypervascular visually obstructed. Pan cystoscopy  was normal and revealed no bladder masses. It was noted the patient's prostate is hypervascular. Transient resection of prostate then took place with 30 cc fashion. After approximately 2 hours. This was due to the large size of the prostate as well as difficulty visualizing due to multiple prostatic venous sinuses that had frequently be coagulated. At the end of the procedure was noted there is some small amount of bladder neck undermining but otherwise the prostate was visually unobstructed. There is no large sinuses bleeding at this point. The verumontanum so the bilateral ureteral orifices were noted and intact the procedure. Using Ellik evacuator all remaining chips of prostate were evacuated out. A 24 French silicone 3-way catheter was placed last per urethra atraumatically. He urinated well. 60 cc placed in balloon. It was placed on tension and CBI was started. His urinalysis light pink.  Plan: The patient will be admitted to the floor we will wean CBI overnight. We will likely discharge him home with catheter due to significant venous ooze at the end the procedure as well as small amount of bladder neck undermining.  Baruch Gouty, M.D.

## 2015-08-17 NOTE — Interval H&P Note (Signed)
History and Physical Interval Note:  08/17/2015 11:32 AM  Daniel Knapp  has presented today for surgery, with the diagnosis of BPH,BLADDER TUMOR  The various methods of treatment have been discussed with the patient and family. After consideration of risks, benefits and other options for treatment, the patient has consented to  Procedure(s): TRANSURETHRAL RESECTION OF THE PROSTATE (TURP) (N/A) TRANSURETHRAL RESECTION OF BLADDER TUMOR (TURBT) (N/A) as a surgical intervention .  The patient's history has been reviewed, patient examined, no change in status, stable for surgery.  I have reviewed the patient's chart and labs.  Questions were answered to the patient's satisfaction.    RRR Unlabored resp   [name]

## 2015-08-18 ENCOUNTER — Encounter: Payer: Self-pay | Admitting: Urology

## 2015-08-18 DIAGNOSIS — N401 Enlarged prostate with lower urinary tract symptoms: Secondary | ICD-10-CM | POA: Diagnosis not present

## 2015-08-18 LAB — BASIC METABOLIC PANEL
Anion gap: 3 — ABNORMAL LOW (ref 5–15)
BUN: 8 mg/dL (ref 6–20)
CHLORIDE: 103 mmol/L (ref 101–111)
CO2: 29 mmol/L (ref 22–32)
Calcium: 7.9 mg/dL — ABNORMAL LOW (ref 8.9–10.3)
Creatinine, Ser: 1.03 mg/dL (ref 0.61–1.24)
GFR calc Af Amer: >60 mL/min (ref >60)
GFR calc non Af Amer: >60 mL/min (ref >60)
GLUCOSE: 123 mg/dL — AB (ref 65–99)
POTASSIUM: 3.6 mmol/L (ref 3.5–5.1)
Sodium: 135 mmol/L (ref 135–145)

## 2015-08-18 LAB — CBC
HCT: 34.8 % — ABNORMAL LOW (ref 40.0–52.0)
Hemoglobin: 12.1 g/dL — ABNORMAL LOW (ref 13.0–18.0)
MCH: 31.8 pg (ref 26.0–34.0)
MCHC: 34.7 g/dL (ref 32.0–36.0)
MCV: 91.8 fL (ref 80.0–100.0)
PLATELETS: 105 10*3/uL — AB (ref 150–440)
RBC: 3.79 MIL/uL — AB (ref 4.40–5.90)
RDW: 13 % (ref 11.5–14.5)
WBC: 6.8 10*3/uL (ref 3.8–10.6)

## 2015-08-18 MED ORDER — HYDROCODONE-ACETAMINOPHEN 5-325 MG PO TABS: 1.0000 | ORAL_TABLET | ORAL | Status: DC | PRN

## 2015-08-18 MED ORDER — OXYBUTYNIN CHLORIDE 5 MG PO TABS
5.0000 mg | ORAL_TABLET | Freq: Three times a day (TID) | ORAL | Status: DC | PRN
  Administered 2015-08-18 – 2015-08-20 (×5): 5 mg via ORAL
  Filled 2015-08-18 (×5): qty 1

## 2015-08-18 MED ORDER — BELLADONNA ALKALOIDS-OPIUM 16.2-60 MG RE SUPP
1.0000 | Freq: Four times a day (QID) | RECTAL | Status: DC | PRN
  Administered 2015-08-18 – 2015-08-20 (×3): 1 via RECTAL
  Filled 2015-08-18 (×3): qty 1

## 2015-08-18 NOTE — Progress Notes (Signed)
No events overnight No n/v/f/c Pain contolled  Filed Vitals:   08/17/15 2006 08/18/15 0006 08/18/15 0417 08/18/15 0557  BP: 119/77 99/61  96/62  Pulse: 62 63  60  Temp: 97.7 F (36.5 C) 97.6 F (36.4 C)  97.7 F (36.5 C)  TempSrc: Oral Oral  Oral  Resp: 18 20  18   Height:   6\' 2"  (1.88 m)   Weight:   228 lb (103.42 kg)   SpO2: 98% 97%  98%   CBI  NAD Soft NT ND Foley light red on CBI  CBC    Component Value Date/Time   WBC 6.8 08/18/2015 0354   WBC 13.1* 09/16/2014 1337   RBC 3.79* 08/18/2015 0354   RBC 4.84 09/16/2014 1337   HGB 12.1* 08/18/2015 0354   HGB 15.9 09/16/2014 1337   HCT 34.8* 08/18/2015 0354   HCT 45.7 09/16/2014 1337   PLT 105* 08/18/2015 0354   PLT 151 09/16/2014 1337   MCV 91.8 08/18/2015 0354   MCV 95 09/16/2014 1337   MCH 31.8 08/18/2015 0354   MCH 32.8 09/16/2014 1337   MCHC 34.7 08/18/2015 0354   MCHC 34.7 09/16/2014 1337   RDW 13.0 08/18/2015 0354   RDW 12.1 09/16/2014 1337   LYMPHSABS 2.1 08/05/2015 0847   LYMPHSABS 1.0 09/16/2014 1337   MONOABS 0.3 08/05/2015 0847   MONOABS 0.7 09/16/2014 1337   EOSABS 0.3 08/05/2015 0847   EOSABS 0.0 09/16/2014 1337   BASOSABS 0.0 08/05/2015 0847   BASOSABS 0.1 09/16/2014 1337    BMP Latest Ref Rng 08/18/2015 08/05/2015 09/16/2014  Glucose 65 - 99 mg/dL 123(H) 123(H) 143(H)  BUN 6 - 20 mg/dL 8 10 8   Creatinine 0.61 - 1.24 mg/dL 1.03 1.23 1.30(H)  Sodium 135 - 145 mmol/L 135 137 135  Potassium 3.5 - 5.1 mmol/L 3.6 3.4(L) 3.4(L)  Chloride 101 - 111 mmol/L 103 102 101  CO2 22 - 32 mmol/L 29 28 26   Calcium 8.9 - 10.3 mg/dL 7.9(L) 9.5 8.9    POD 1 TURP. Very vascular prostate. Bladder irrigated this am with small clots -continue CBI. Wean as tolerated. Will likely need another day of CBI. If no improvement by mid day, will put back on traction -reg diet -plan for home with foley with trial of void next week once CBI is weaned

## 2015-08-18 NOTE — Care Management Obs Status (Signed)
[place] NOTIFICATION   Patient Details  Name: REXTON HELSER MRN: [id number] Date of Birth: May 30, 1951   Medicare Observation Status Notification Given:  Yes  Reviewed and signed by patient.  Copy sent to him  Beverly Sessions, RN 08/18/2015, 2:23 PM

## 2015-08-18 NOTE — Progress Notes (Signed)
Per Dr. Pilar Jarvis place order for B and O suppository per pharmacy dosing prn. Also place order for ditropan 5mg  po prn q 8hours

## 2015-08-19 DIAGNOSIS — N401 Enlarged prostate with lower urinary tract symptoms: Secondary | ICD-10-CM | POA: Diagnosis not present

## 2015-08-19 LAB — BASIC METABOLIC PANEL
Anion gap: 2 — ABNORMAL LOW (ref 5–15)
BUN: 9 mg/dL (ref 6–20)
CALCIUM: 8.1 mg/dL — AB (ref 8.9–10.3)
CO2: 27 mmol/L (ref 22–32)
CREATININE: 0.99 mg/dL (ref 0.61–1.24)
Chloride: 110 mmol/L (ref 101–111)
GFR calc Af Amer: >60 mL/min (ref >60)
GLUCOSE: 112 mg/dL — AB (ref 65–99)
Potassium: 4.1 mmol/L (ref 3.5–5.1)
Sodium: 139 mmol/L (ref 135–145)

## 2015-08-19 LAB — CBC
HCT: 26.1 % — ABNORMAL LOW (ref 40.0–52.0)
HEMATOCRIT: 26.1 % — AB (ref 40.0–52.0)
HEMOGLOBIN: 9 g/dL — AB (ref 13.0–18.0)
Hemoglobin: 9.1 g/dL — ABNORMAL LOW (ref 13.0–18.0)
MCH: 32.5 pg (ref 26.0–34.0)
MCH: 31.6 pg (ref 26.0–34.0)
MCHC: 34.8 g/dL (ref 32.0–36.0)
MCHC: 34.4 g/dL (ref 32.0–36.0)
MCV: 93.4 fL (ref 80.0–100.0)
MCV: 92 fL (ref 80.0–100.0)
PLATELETS: 82 10*3/uL — AB (ref 150–440)
Platelets: 79 10*3/uL — ABNORMAL LOW (ref 150–440)
RBC: 2.79 MIL/uL — ABNORMAL LOW (ref 4.40–5.90)
RBC: 2.83 MIL/uL — ABNORMAL LOW (ref 4.40–5.90)
RDW: 12.7 % (ref 11.5–14.5)
RDW: 13.1 % (ref 11.5–14.5)
WBC: 5.3 10*3/uL (ref 3.8–10.6)
WBC: 5.1 10*3/uL (ref 3.8–10.6)

## 2015-08-19 LAB — HEMOGLOBIN AND HEMATOCRIT, BLOOD
HEMATOCRIT: 29.7 % — AB (ref 40.0–52.0)
HEMOGLOBIN: 10.2 g/dL — AB (ref 13.0–18.0)

## 2015-08-19 NOTE — Progress Notes (Signed)
Patient seen and examined this morning. Significant drop in hemoglobin today but stable throughout the day. CBI continues to run this morning on a moderate drip now slow drip without any clots or catheter obstruction. Catheter taken off of tension.  Filed Vitals:   08/18/15 1131 08/18/15 2107 08/19/15 0701 08/19/15 1246  BP: 111/63 127/74 120/67 114/60  Pulse: 85 96 78 66  Temp:  98.2 F (36.8 C) 98.3 F (36.8 C) 97.9 F (36.6 C)  TempSrc:  Oral Oral Oral  Resp:  16 16 16   Height:      Weight:      SpO2:  99% 100% 100%   CBI  NAD No respiratory distress Soft NT ND Foley light pink on CBI on moderate ggt No LE edema  CBC CBC Latest Ref Rng 08/19/2015 08/19/2015 08/18/2015  WBC 3.8 - 10.6 K/uL 5.1 5.3 6.8  Hemoglobin 13.0 - 18.0 g/dL 9.0(L) 9.1(L) 12.1(L)  Hematocrit 40.0 - 52.0 % 26.1(L) 26.1(L) 34.8(L)  Platelets 150 - 440 K/uL 79(L) 82(L) 105(L)      BMP Latest Ref Rng 08/19/2015 08/18/2015 08/05/2015  Glucose 65 - 99 mg/dL 112(H) 123(H) 123(H)  BUN 6 - 20 mg/dL 9 8 10   Creatinine 0.61 - 1.24 mg/dL 0.99 1.03 1.23  Sodium 135 - 145 mmol/L 139 135 137  Potassium 3.5 - 5.1 mmol/L 4.1 3.6 3.4(L)  Chloride 101 - 111 mmol/L 110 103 102  CO2 22 - 32 mmol/L 27 29 28   Calcium 8.9 - 10.3 mg/dL 8.1(L) 7.9(L) 9.5    POD 1=2 TURP. Very vascular prostate.  -continue CBI. Wean as tolerated, likely off in AM. -reg diet  -plan for home with foley with trial of void next week once CBI is weaned -recheck H/H in AM -catheter off tension

## 2015-08-20 DIAGNOSIS — N401 Enlarged prostate with lower urinary tract symptoms: Secondary | ICD-10-CM | POA: Diagnosis not present

## 2015-08-20 LAB — CBC
HEMATOCRIT: 24.1 % — AB (ref 40.0–52.0)
Hemoglobin: 8.3 g/dL — ABNORMAL LOW (ref 13.0–18.0)
MCH: 31.7 pg (ref 26.0–34.0)
MCHC: 34.6 g/dL (ref 32.0–36.0)
MCV: 91.6 fL (ref 80.0–100.0)
PLATELETS: 78 10*3/uL — AB (ref 150–440)
RBC: 2.63 MIL/uL — AB (ref 4.40–5.90)
RDW: 12.9 % (ref 11.5–14.5)
WBC: 5.2 10*3/uL (ref 3.8–10.6)

## 2015-08-20 NOTE — Progress Notes (Signed)
3 Days Post-Op Subjective: Patient denies pain. Significant drop in hemoglobin to 8.3 today. Urine light pink on slow drip CBI. CBI stopped and urine turned red so CBI was restarted.  Pt ambulating well  Objective: Vital signs in last 24 hours: Temp:  [98.1 F (36.7 C)-98.2 F (36.8 C)] 98.1 F (36.7 C) (03/11 1353) Pulse Rate:  [71-87] 71 (03/11 1353) Resp:  [16] 16 (03/11 1353) BP: (106-121)/(63-69) 106/63 mmHg (03/11 1353) SpO2:  [94 %-100 %] 100 % (03/11 1353)  Intake/Output from previous day: 03/10 0701 - 03/11 0700 In: 45006.7 [P.O.:240; I.V.:2766.7] Out: 47600 [Urine:47600] Intake/Output this shift: Total I/O In: 2000 [Other:2000] Out: 1350 [Urine:1350]  Physical Exam:  General:alert, cooperative and appears stated age GI: soft, non tender, normal bowel sounds, no palpable masses, no organomegaly, no inguinal hernia Male genitalia: no penile lesions or discharge no testicular masses no bladder distension noted Extremities: extremities normal, atraumatic, no cyanosis or edema  Lab Results:  Recent Labs  08/19/15 1303 08/19/15 1758 08/20/15 0457  HGB 9.0* 10.2* 8.3*  HCT 26.1* 29.7* 24.1*   BMET  Recent Labs  08/18/15 0354 08/19/15 0523  NA 135 139  K 3.6 4.1  CL 103 110  CO2 29 27  GLUCOSE 123* 112*  BUN 8 9  CREATININE 1.03 0.99  CALCIUM 7.9* 8.1*   No results for input(s): LABPT, INR in the last 72 hours. No results for input(s): LABURIN in the last 72 hours. Results for orders placed or performed during the hospital encounter of 08/05/15  Urine culture     Status: None   Collection Time: 08/05/15  8:26 AM  Result Value Ref Range Status   Specimen Description URINE, CLEAN CATCH  Final   Special Requests NONE  Final   Culture NO GROWTH 2 DAYS  Final   Report Status 08/07/2015 FINAL  Final    Studies/Results: No results found.  Assessment/Plan: 65yo with BPH s/p TURP, gross hematuria, acute blood loss anemia   1. Continue CBI on slow  drip and wean to off 2. CBC in AM 3. Possible discharge tomorrow if urine clear and hemoglobin stable.      Jerzi Tigert L 08/20/2015, 9:09 PM

## 2015-08-21 DIAGNOSIS — N401 Enlarged prostate with lower urinary tract symptoms: Secondary | ICD-10-CM | POA: Diagnosis not present

## 2015-08-21 LAB — CBC
HEMATOCRIT: 22.9 % — AB (ref 40.0–52.0)
Hemoglobin: 7.9 g/dL — ABNORMAL LOW (ref 13.0–18.0)
MCH: 31.9 pg (ref 26.0–34.0)
MCHC: 34.5 g/dL (ref 32.0–36.0)
MCV: 92.7 fL (ref 80.0–100.0)
Platelets: 78 10*3/uL — ABNORMAL LOW (ref 150–440)
RBC: 2.47 MIL/uL — ABNORMAL LOW (ref 4.40–5.90)
RDW: 12.8 % (ref 11.5–14.5)
WBC: 4.9 10*3/uL (ref 3.8–10.6)

## 2015-08-21 NOTE — Discharge Summary (Signed)
Date of admission: 08/17/2015  Date of discharge: 08/21/2015  Admission diagnosis: acute urinary retention  Discharge diagnosis: same, s/p TURP  Secondary diagnoses:  Patient Active Problem List   Diagnosis Date Noted  . BPH (benign prostatic hyperplasia) 08/17/2015  . Urinary retention 11/10/2014  . BPH (benign prostatic hypertrophy) with urinary retention 11/10/2014    History and Physical: For full details, please see admission history and physical. Briefly, Daniel Knapp is a 65 y.o. year old patient with obstructive bladder outlet symptoms.   Hospital Course: Patient tolerated the procedure well.  He was then transferred to the floor after an uneventful PACU stay.  His hospital course was uncomplicated.  On POD#3  he had met discharge criteria: was eating a regular diet, was up and ambulating independently,  pain was well controlled, his urine had cleared significantly - draining pink, and was ready to for discharge.  PE: Filed Vitals:   08/20/15 0557 08/20/15 1353 08/20/15 2117 08/21/15 0543  BP: 121/69 106/63 108/63 115/71  Pulse: 87 71 84 90  Temp: 98.2 F (36.8 C) 98.1 F (36.7 C) 98.4 F (36.9 C) 98.2 F (36.8 C)  TempSrc: Oral Oral Oral Oral  Resp: 16 16 16 20   Height:      Weight:      SpO2: 94% 100% 96% 97%    Intake/Output Summary (Last 24 hours) at 08/21/15 0946 Last data filed at 08/21/15 0904  Gross per 24 hour  Intake 19785.81 ml  Output  23475 ml  Net -3689.19 ml   NAD Non-labored breathing Abdomen is soft - no suprapubic tenderness Foley draining clear efflux on slow CBI drip. Ext symmetric   Laboratory values:   Recent Labs  08/19/15 1303 08/19/15 1758 08/20/15 0457 08/21/15 0442  WBC 5.1  --  5.2 4.9  HGB 9.0* 10.2* 8.3* 7.9*  HCT 26.1* 29.7* 24.1* 22.9*    Recent Labs  08/19/15 0523  NA 139  K 4.1  CL 110  CO2 27  GLUCOSE 112*  BUN 9  CREATININE 0.99  CALCIUM 8.1*   No results for input(s): LABPT, INR in the last 72  hours. No results for input(s): LABURIN in the last 72 hours. Results for orders placed or performed during the hospital encounter of 08/05/15  Urine culture     Status: None   Collection Time: 08/05/15  8:26 AM  Result Value Ref Range Status   Specimen Description URINE, CLEAN CATCH  Final   Special Requests NONE  Final   Culture NO GROWTH 2 DAYS  Final   Report Status 08/07/2015 FINAL  Final    Disposition: Home  Discharge instruction: The patient was instructed to be ambulatory but told to refrain from heavy lifting, strenuous activity, or driving.   Discharge medications:   Medication List    TAKE these medications        amLODipine 10 MG tablet  Commonly known as:  NORVASC  Take 10 mg by mouth daily.     finasteride 5 MG tablet  Commonly known as:  PROSCAR  Take 5 mg by mouth daily. Reported on 08/17/2015     HYDROcodone-acetaminophen 5-325 MG tablet  Commonly known as:  NORCO/VICODIN  Take 1-2 tablets by mouth every 4 (four) hours as needed for moderate pain.     metoprolol tartrate 25 MG tablet  Commonly known as:  LOPRESSOR  Take 25 mg by mouth 2 (two) times daily. Reported on 08/17/2015     tamsulosin 0.4 MG Caps capsule  Commonly known as:  FLOMAX  Take 0.4 mg by mouth daily. Reported on 08/17/2015        Followup:      Follow-up Information    Follow up with [name], MD In 1 week.   Specialty:  Urology   Why:  foley removal   Contact information:   [address] [place] [address] [phone]

## 2015-08-21 NOTE — Discharge Instructions (Signed)
Transurethral Resection of the Prostate (TURP) or Greenlight laser ablation of the Prostate ° °Care After ° °Refer to this sheet in the next few weeks. These discharge instructions provide you with general information on caring for yourself after you leave the hospital. Your caregiver may also give you specific instructions. Your treatment has been planned according to the most current medical practices available, but unavoidable complications sometimes occur. If you have any problems or questions after discharge, please call your caregiver. ° °HOME CARE INSTRUCTIONS  ° °Medications °· You may receive medicine for pain management. As your level of discomfort decreases, adjustments in your pain medicines may be made.  °· Take all medicines as directed.  °· You may be given a medicine (antibiotic) to kill germs following surgery. Finish all medicines. Let your caregiver know if you have any side effects or problems from the medicine.  °· If you are on aspirin, it would be best not to restart the aspirin until the blood in the urine clears °Hygiene °· You can take a shower after surgery.  °· You should not take a bath while you still have the urethral catheter. °Activity °· You will be encouraged to get out of bed as much as possible and increase your activity level as tolerated.  °· Spend the first week in and around your home. For 3 weeks, avoid the following:  °· Straining.  °· Running.  °· Strenuous work.  °· Walks longer than a few blocks.  °· Riding for extended periods.  °· Sexual relations.  °· Do not lift heavy objects (more than 20 pounds) for at least 1 month. When lifting, use your arms instead of your abdominal muscles.  °· You will be encouraged to walk as tolerated. Do not exert yourself. Increase your activity level slowly. Remember that it is important to keep moving after an operation of any type. This cuts down on the possibility of developing blood clots.  °· Your caregiver will tell you when you  can resume driving and light housework. Discuss this at your first office visit after discharge. °Diet °· No special diet is ordered after a TURP. However, if you are on a special diet for another medical problem, it should be continued.  °· Normal fluid intake is usually recommended.  °· Avoid alcohol and caffeinated drinks for 2 weeks. They irritate the bladder. Decaffeinated drinks are okay.  °· Avoid spicy foods.  °Bladder Function °· For the first 10 days, empty the bladder whenever you feel a definite desire. Do not try to hold the urine for long periods of time.  °· Urinating once or twice a night even after you are healed is not uncommon.  °· You may see some recurrence of blood in the urine after discharge from the hospital. This usually happens within 2 weeks after the procedure.If this occurs, force fluids again as you did in the hospital and reduce your activity.  °Bowel Function °· You may experience some constipation after surgery. This can be minimized by increasing fluids and fiber in your diet. Drink enough water and fluids to keep your urine clear or pale yellow.  °· A stool softener may be prescribed for use at home. Do not strain to move your bowels.  °· If you are requiring increased pain medicine, it is important that you take stool softeners to prevent constipation. This will help to promote proper healing by reducing the need to strain to move your bowels.  °Sexual Activity °· Semen movement   in the opposite direction and into the bladder (retrograde ejaculation) may occur. Since the semen passes into the bladder, cloudy urine can occur the first time you urinate after intercourse. Or, you may not have an ejaculation during erection. Ask your caregiver when you can resume sexual activity. Retrograde ejaculation and reduced semen discharge should not reduce one's pleasure of intercourse.  °Postoperative Visit °· Arrange the date and time of your after surgery visit with your caregiver.  °Return  to Work °· After your recovery is complete, you will be able to return to work and resume all activities. Your caregiver will inform you when you can return to work.  ° ° °Foley Catheter Care °A soft, flexible tube (Foley catheter) may have been placed in your bladder to drain urine and fluid. Follow these instructions: °Taking Care of the Catheter °· Keep the area where the catheter leaves your body clean.  °· Attach the catheter to the leg so there is no tension on the catheter.  °· Keep the drainage bag below the level of the bladder, but keep it OFF the floor.  °· Do not take long soaking baths. Your caregiver will give instructions about showering.  °· Wash your hands before touching ANYTHING related to the catheter or bag.  °· Using mild soap and warm water on a washcloth:  °· Clean the area closest to the catheter insertion site using a circular motion around the catheter.  °· Clean the catheter itself by wiping AWAY from the insertion site for several inches down the tube.  °· NEVER wipe upward as this could sweep bacteria up into the urethra (tube in your body that normally drains the bladder) and cause infection.  °· Place a Pankonin amount of sterile lubricant at the tip of the penis where the catheter is entering.  °Taking Care of the Drainage Bags °· Two drainage bags may be taken home: a large overnight drainage bag, and a smaller leg bag which fits underneath clothing.  °· It is okay to wear the overnight bag at any time, but NEVER wear the smaller leg bag at night.  °· Keep the drainage bag well below the level of your bladder. This prevents backflow of urine into the bladder and allows the urine to drain freely.  °· Anchor the tubing to your leg to prevent pulling or tension on the catheter. Use tape or a leg strap provided by the hospital.  °· Empty the drainage bag when it is 1/2 to 3/4 full. Wash your hands before and after touching the bag.  °· Periodically check the tubing for kinks to make sure  there is no pressure on the tubing which could restrict the flow of urine.  °Changing the Drainage Bags °· Cleanse both ends of the clean bag with alcohol before changing.  °· Pinch off the rubber catheter to avoid urine spillage during the disconnection.  °· Disconnect the dirty bag and connect the clean one.  °· Empty the dirty bag carefully to avoid a urine spill.  °· Attach the new bag to the leg with tape or a leg strap.  °Cleaning the Drainage Bags °· Whenever a drainage bag is disconnected, it must be cleaned quickly so it is ready for the next use.  °· Wash the bag in warm, soapy water.  °· Rinse the bag thoroughly with warm water.  °· Soak the bag for 30 minutes in a solution of white vinegar and water (1 cup vinegar to 1 quart warm   water).   Rinse with warm water.  SEEK MEDICAL CARE IF:   You have chills or night sweats.   You are leaking around your catheter or have problems with your catheter. It is not uncommon to have sporadic leakage around your catheter as a result of bladder spasms. If the leakage stops, there is not much need for concern. If you are uncertain, call your caregiver.   You develop side effects that you think are coming from your medicines.  SEEK IMMEDIATE MEDICAL CARE IF:   You are suddenly unable to urinate. Check to see if there are any kinks in the drainage tubing that may cause this. If you cannot find any kinks, call your caregiver immediately. This is an emergency.   You develop shortness of breath or chest pains.   Bleeding persists or clots develop in your urine.   You have a fever.   You develop pain in your back or over your lower belly (abdomen).   You develop pain or swelling in your legs.   Any problems you are having get worse rather than better.  MAKE SURE YOU:   Understand these instructions.   Will watch your condition.   Will get help right away if you are not doing well or get worse.  *You are being discharged with a foley in place.    *Please monitor your urine output and notify your doctor if you aren't passing any urine.  *You can switch to the large urine bag at bedtime and use the small urine bag during the day.  *Drink plenty of water.  *Take all medications as prescribed. *Notify your doctor with any questions or concerns.

## 2015-08-21 NOTE — Progress Notes (Signed)
Pt had bladder pain, small clot passed into drainage bag and relieved the pain. Urine pink. Will continue to monitor CBI.

## 2015-08-22 ENCOUNTER — Telehealth: Payer: Self-pay

## 2015-08-22 ENCOUNTER — Telehealth: Payer: Self-pay | Admitting: Urology

## 2015-08-22 NOTE — Telephone Encounter (Signed)
Pathology called stating the pathology report is going to be delayed until later on this week for pt TURPT. Per pathology in 1chip there is a 58mm gleason 4 with possible 1A.

## 2015-08-22 NOTE — Telephone Encounter (Signed)
It is ok as long as his catheter is draining and doesn't feel like his bladder is full. He needs to an appt to see me this week on Thursday or Friday for a trial of void. He must be seen before 10 Am, so if he fails the TOV he can come back in the afternoon to replace the foley. Ok to [place] him.

## 2015-08-22 NOTE — Telephone Encounter (Signed)
Patient had TURP on 3/8, discharged from hospital on 3/12.  Patient is calling this morning stating that his urine looks like a chocolate milkshake.  He wants to know if this is normal.  He does not have a follow up appointment.  Please advise on how to proceed.

## 2015-08-23 NOTE — Telephone Encounter (Signed)
Done ° ° °Daniel Knapp °

## 2015-08-24 ENCOUNTER — Telehealth: Payer: Self-pay

## 2015-08-24 LAB — SURGICAL PATHOLOGY

## 2015-08-24 NOTE — Telephone Encounter (Signed)
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 [name] said you can call her at [phone] with any questions.

## 2015-08-25 ENCOUNTER — Ambulatory Visit (INDEPENDENT_AMBULATORY_CARE_PROVIDER_SITE_OTHER): Payer: Medicare Other | Admitting: Urology

## 2015-08-25 ENCOUNTER — Encounter: Payer: Self-pay | Admitting: Urology

## 2015-08-25 VITALS — BP 163/84 | HR 103 | Ht 74.0 in | Wt 227.8 lb

## 2015-08-25 DIAGNOSIS — C61 Malignant neoplasm of prostate: Secondary | ICD-10-CM

## 2015-08-25 DIAGNOSIS — N400 Enlarged prostate without lower urinary tract symptoms: Secondary | ICD-10-CM

## 2015-08-25 NOTE — Progress Notes (Signed)
08/25/2015 11:33 AM   Daniel Knapp Oct 31, 1950 [id number]  Referring provider: No referring provider defined for this encounter.  Chief Complaint  Patient presents with  . Post-op Problem    urinary retention, TURP. pt complains of gross hematuria in catheter bag.    HPI: The patient is a 65 year old gentleman who recently underwent a TURP for his BPH. Of note, he had an extremely vascular prostate and required an extended hospital stay. He was eventually discharged home with a Foley catheter. He returns today with a Foley catheter placed in complaints of hematuria.  Of note, he had incidental finding of 1 mm segment of grade 3+4 prostate cancer in 0.01% of the 44 gm of prostatic tissue removed. His preop PSA was normal.   PMH: Past Medical History  Diagnosis Date  . HTN (hypertension)   . Acute cystitis with hematuria   . Weight loss   . Urinary retention   . UTI symptoms   . Urinary catheter insertion/adjustment/removal   . Prostate enlargement   . UTI (lower urinary tract infection)     Surgical History: Past Surgical History  Procedure Laterality Date  . No past surgeries    . Transurethral resection of prostate N/A 08/17/2015    Procedure: TRANSURETHRAL RESECTION OF THE PROSTATE (TURP);  Surgeon: [name], MD;  Location: [facility] ORS;  Service: Urology;  Laterality: N/A;  . Transurethral resection of bladder tumor N/A 08/17/2015    Procedure: TRANSURETHRAL RESECTION OF BLADDER TUMOR (TURBT);  Surgeon: [name], MD;  Location: [facility] ORS;  Service: Urology;  Laterality: N/A;    Home Medications:    Medication List       This list is accurate as of: 08/25/15 11:33 AM.  Always use your most recent med list.               amLODipine 10 MG tablet  Commonly known as:  NORVASC  Take 10 mg by mouth daily.     finasteride 5 MG tablet  Commonly known as:  PROSCAR  Take 5 mg by mouth daily. Reported on 08/17/2015     HYDROcodone-acetaminophen  5-325 MG tablet  Commonly known as:  NORCO/VICODIN  Take 1-2 tablets by mouth every 4 (four) hours as needed for moderate pain.     metoprolol tartrate 25 MG tablet  Commonly known as:  LOPRESSOR  Take 25 mg by mouth 2 (two) times daily. Reported on 08/17/2015     tamsulosin 0.4 MG Caps capsule  Commonly known as:  FLOMAX  Take 0.4 mg by mouth daily. Reported on 08/17/2015        Allergies: No Known Allergies  Family History: Family History  Problem Relation Age of Onset  . Cancer Father   . Heart disease Mother   . Kidney disease Neg Hx   . Prostate cancer Neg Hx     Social History:  reports that he has never smoked. He does not have any smokeless tobacco history on file. He reports that he drinks alcohol. He reports that he does not use illicit drugs.  ROS:                                        Physical Exam: BP 163/84 mmHg  Pulse 103  Ht 6\' 2"  (1.88 m)  Wt 227 lb 12.8 oz (103.329 kg)  BMI 29.24 kg/m2  Constitutional:  Alert and  oriented, No acute distress. HEENT: [place] AT, moist mucus membranes.  Trachea midline, no masses. Cardiovascular: No clubbing, cyanosis, or edema. Respiratory: Normal respiratory effort, no increased work of breathing. GI: Abdomen is soft, nontender, nondistended, no abdominal masses GU: No CVA tenderness. Foley in place. Clear yellow urine. No clots. Skin: No rashes, bruises or suspicious lesions. Lymph: No cervical or inguinal adenopathy. Neurologic: Grossly intact, no focal deficits, moving all 4 extremities. Psychiatric: Normal mood and affect.  Laboratory Data: Lab Results  Component Value Date   WBC 4.9 08/21/2015   HGB 7.9* 08/21/2015   HCT 22.9* 08/21/2015   MCV 92.7 08/21/2015   PLT 78* 08/21/2015    Lab Results  Component Value Date   CREATININE 0.99 08/19/2015    No results found for: PSA  No results found for: TESTOSTERONE  No results found for: HGBA1C  Urinalysis    Component Value  Date/Time   COLORURINE Amber 09/17/2014 2237   APPEARANCEUR Clear 07/20/2015 0917   APPEARANCEUR Hazy 09/17/2014 2237   LABSPEC 1.014 09/17/2014 2237   PHURINE 6.0 09/17/2014 2237   GLUCOSEU Negative 07/20/2015 0917   GLUCOSEU Negative 09/17/2014 2237   HGBUR 3+ 09/17/2014 2237   BILIRUBINUR Negative 07/20/2015 0917   BILIRUBINUR Negative 09/17/2014 2237   KETONESUR Negative 09/17/2014 2237   PROTEINUR 1+* 07/20/2015 0917   PROTEINUR 100 mg/dL 09/17/2014 2237   NITRITE Positive* 07/20/2015 0917   NITRITE Negative 09/17/2014 2237   LEUKOCYTESUR Trace* 07/20/2015 0917   LEUKOCYTESUR Negative 09/17/2014 2237      Assessment & Plan:    1. BPH status post TURP The patient underwent an passed a trial of void today. He was instructed to return the office he does not urinate by 3:30 PM she had the Foley catheter replaced.  2. pT1a Gleason 3+4 prostate cancer-Micro Focus I discussed with the patient that he has a Micro Focus of Gleason 3+4 prostate cancer found incidentally on his TURP specimen. He had only 1 mm or 0.01% of 44 g of prostate tissue resected that actually had prostate cancer. We discussed prostate cancer in great detail including treatment options which include watchful waiting, active surveillance, radiation, and surgery. I think he is an ideal candidate for active surveillance given that he has such a small amount of tissue that was found incidentally on a TURP. We will arrange for him to undergo a prostate biopsy in 3-6 months to ensure there is no more significant amount of cancer. He understands the risks, benefits, and indications of this procedure. He understands the risks include but are not limited to bleeding and infection. He understands that his blood in his stool, semen, and urine. He also understands that there is approximate 1% chance of infection after procedure requiring IV antibiotics in the hospital. He is agreeable and wishes to proceed.  Return for prostate  biopsy in 3-6 months.  [name], MD  [facility] [address], [place], [place] [address] [phone]

## 2015-08-26 ENCOUNTER — Ambulatory Visit: Payer: Medicare Other

## 2015-09-01 ENCOUNTER — Telehealth: Payer: Self-pay

## 2015-09-01 ENCOUNTER — Encounter: Payer: Self-pay | Admitting: Urology

## 2015-09-01 ENCOUNTER — Ambulatory Visit (INDEPENDENT_AMBULATORY_CARE_PROVIDER_SITE_OTHER): Payer: Medicare Other | Admitting: Urology

## 2015-09-01 VITALS — BP 158/89 | HR 76 | Ht 74.0 in | Wt 221.5 lb

## 2015-09-01 DIAGNOSIS — N401 Enlarged prostate with lower urinary tract symptoms: Secondary | ICD-10-CM

## 2015-09-01 DIAGNOSIS — N400 Enlarged prostate without lower urinary tract symptoms: Secondary | ICD-10-CM

## 2015-09-01 DIAGNOSIS — R339 Retention of urine, unspecified: Secondary | ICD-10-CM

## 2015-09-01 DIAGNOSIS — R338 Other retention of urine: Principal | ICD-10-CM

## 2015-09-01 LAB — URINALYSIS, COMPLETE
Bilirubin, UA: NEGATIVE
Glucose, UA: NEGATIVE
Ketones, UA: NEGATIVE
NITRITE UA: POSITIVE — AB
Urobilinogen, Ur: 1 mg/dL (ref 0.2–1.0)
pH, UA: 5.5 (ref 5.0–7.5)

## 2015-09-01 LAB — MICROSCOPIC EXAMINATION: WBC, UA: >30 /hpf — ABNORMAL HIGH (ref 0–?)

## 2015-09-01 MED ORDER — CIPROFLOXACIN HCL 500 MG PO TABS: 500.0000 mg | ORAL_TABLET | Freq: Two times a day (BID) | ORAL | Status: DC

## 2015-09-01 NOTE — Progress Notes (Signed)
09/01/2015 10:08 AM   Daniel Knapp 24-May-1951 [id number]  Referring provider: No referring provider defined for this encounter.  Chief Complaint  Patient presents with  . Post-op Problem    scrotum swelling, painful to the touch.     HPI: The patient is a 65 year old gentleman who recently underwent a TURP for his BPH. He returns today with complaints of scrotal swelling. He has minimal dysuria. He finds it somewhat uncomfortable but his scrotum is swollen. He is concerned as this was not like this prior to the procedure. He denies fevers or chills.  Of note, he had incidental finding of 1 mm segment of grade 3+4 prostate cancer in 0.01% of the 44 gm of prostatic tissue removed. His preop PSA was normal.    PMH: Past Medical History  Diagnosis Date  . HTN (hypertension)   . Acute cystitis with hematuria   . Weight loss   . Urinary retention   . UTI symptoms   . Urinary catheter insertion/adjustment/removal   . Prostate enlargement   . UTI (lower urinary tract infection)     Surgical History: Past Surgical History  Procedure Laterality Date  . No past surgeries    . Transurethral resection of prostate N/A 08/17/2015    Procedure: TRANSURETHRAL RESECTION OF THE PROSTATE (TURP);  Surgeon: [name], MD;  Location: [facility];  Service: Urology;  Laterality: N/A;  . Transurethral resection of bladder tumor N/A 08/17/2015    Procedure: TRANSURETHRAL RESECTION OF BLADDER TUMOR (TURBT);  Surgeon: [name], MD;  Location: [facility] OR[facility];  Service: Urology;  Laterality: N/A;    Home Medications:    Medication List       This list is accurate as of: 09/01/15 10:08 AM.  Always use your most recent med list.               amLODipine 10 MG tablet  Commonly known as:  NORVASC  Take 10 mg by mouth daily.     finasteride 5 MG tablet  Commonly known as:  PROSCAR  Take 5 mg by mouth daily. Reported on 08/17/2015     HYDROcodone-acetaminophen 5-325 MG tablet    Commonly known as:  NORCO/VICODIN  Take 1-2 tablets by mouth every 4 (four) hours as needed for moderate pain.     metoprolol tartrate 25 MG tablet  Commonly known as:  LOPRESSOR  Take 25 mg by mouth 2 (two) times daily. Reported on 08/17/2015     tamsulosin 0.4 MG Caps capsule  Commonly known as:  FLOMAX  Take 0.4 mg by mouth daily. Reported on 09/01/2015        Allergies: No Known Allergies  Family History: Family History  Problem Relation Age of Onset  . Cancer Father   . Heart disease Mother   . Kidney disease Neg Hx   . Prostate cancer Neg Hx     Social History:  reports that he has never smoked. He does not have any smokeless tobacco history on file. He reports that he drinks alcohol. He reports that he does not use illicit drugs.  ROS:                                        Physical Exam: BP 158/89 mmHg  Pulse 76  Ht 6\' 2"  (1.88 m)  Wt 221 lb 8 oz (100.472 kg)  BMI 28.43 kg/m2  Constitutional:  Alert and oriented, No acute distress. HEENT: [place] AT, moist mucus membranes.  Trachea midline, no masses. Cardiovascular: No clubbing, cyanosis, or edema. Respiratory: Normal respiratory effort, no increased work of breathing. GI: Abdomen is soft, nontender, nondistended, no abdominal masses GU: No CVA tenderness. Right testicle normal. Left testicle swollen with with exam consistent for left epididymoorchitis. Mildly tender to palpation. No sign of Fournier's or crepitus. Skin: No rashes, bruises or suspicious lesions. Lymph: No cervical or inguinal adenopathy. Neurologic: Grossly intact, no focal deficits, moving all 4 extremities. Psychiatric: Normal mood and affect.  Laboratory Data: Lab Results  Component Value Date   WBC 4.9 08/21/2015   HGB 7.9* 08/21/2015   HCT 22.9* 08/21/2015   MCV 92.7 08/21/2015   PLT 78* 08/21/2015    Lab Results  Component Value Date   CREATININE 0.99 08/19/2015    No results found for: PSA  No results  found for: TESTOSTERONE  No results found for: HGBA1C  Urinalysis    Component Value Date/Time   COLORURINE Amber 09/17/2014 2237   APPEARANCEUR Clear 07/20/2015 0917   APPEARANCEUR Hazy 09/17/2014 2237   LABSPEC 1.014 09/17/2014 2237   PHURINE 6.0 09/17/2014 2237   GLUCOSEU Negative 07/20/2015 0917   GLUCOSEU Negative 09/17/2014 2237   HGBUR 3+ 09/17/2014 2237   BILIRUBINUR Negative 07/20/2015 0917   BILIRUBINUR Negative 09/17/2014 2237   KETONESUR Negative 09/17/2014 2237   PROTEINUR 1+* 07/20/2015 0917   PROTEINUR 100 mg/dL 09/17/2014 2237   NITRITE Positive* 07/20/2015 0917   NITRITE Negative 09/17/2014 2237   LEUKOCYTESUR Trace* 07/20/2015 0917   LEUKOCYTESUR Negative 09/17/2014 2237      Assessment & Plan:    1. Left epididymoorchitis -The patient has left epidermal orchitis likely secondary to his recent Foley catheter. We will start him on ciprofloxacin 500 mg twice a day for 10 days. We will send a urine culture. He'll follow-up if the swelling doesn't improve within 1 month or worsens.  2. BPH status post TURP -voiding well at this time  2. pT1a Gleason 3+4 prostate cancer-Micro Focus I discussed with the patient that he has a Micro Focus of Gleason 3+4 prostate cancer found incidentally on his TURP specimen. He had only 1 mm or 0.01% of 44 g of prostate tissue resected that actually had prostate cancer. We'll plan for repeat prostate biopsy in 3-6 months as discussed at his office visit last week.  [name], MD  [facility] [address] [place], [place] [address] [phone]

## 2015-09-01 NOTE — Addendum Note (Signed)
Addended by: Wilson Singer on: 09/01/2015 10:28 AM   Modules accepted: Orders

## 2015-09-01 NOTE — Telephone Encounter (Signed)
Pt called c/o severe swelling around his testicles. Pt was very anxious and upset. Per Dr. Pilar Jarvis pt needs to be seen in office today. Made pt aware.

## 2015-09-03 LAB — CULTURE, URINE COMPREHENSIVE

## 2015-09-05 ENCOUNTER — Telehealth: Payer: Self-pay

## 2015-09-05 NOTE — Telephone Encounter (Signed)
Pt called this morning c/o horrible dysuria at the end of urination. Pt had a ucx on 09/01/15 that was positive. Per Dr. Pilar Jarvis pt is currently taking cipro which should take care of infection. Pt was given urogesic blue samples to help with dysuria. Pt voiced understanding. Samples were left up front.

## 2015-09-09 ENCOUNTER — Telehealth: Payer: Self-pay

## 2015-09-09 NOTE — Telephone Encounter (Signed)
-----   Message from [employer] sent at 09/09/2015  8:22 AM EDT ----- Regarding: Pills Contact: [phone] Pt was wondering if you could call him back regarding more medicine samples for his burning when he pees.

## 2015-09-09 NOTE — Telephone Encounter (Signed)
Spoke with pt in reference to medication samples. Pt stated he was continuing to have burning on urination. Made pt aware we do not have anymore samples of uribel but he could go to the pharmacy and pick up some AZO tablets. Pt voiced understanding.

## 2016-01-23 ENCOUNTER — Ambulatory Visit (INDEPENDENT_AMBULATORY_CARE_PROVIDER_SITE_OTHER): Payer: Medicare Other | Admitting: Urology

## 2016-01-23 ENCOUNTER — Other Ambulatory Visit: Payer: Self-pay | Admitting: Urology

## 2016-01-23 VITALS — BP 191/115 | HR 87 | Wt 223.0 lb

## 2016-01-23 DIAGNOSIS — C61 Malignant neoplasm of prostate: Secondary | ICD-10-CM | POA: Diagnosis not present

## 2016-01-23 DIAGNOSIS — N400 Enlarged prostate without lower urinary tract symptoms: Secondary | ICD-10-CM

## 2016-01-23 MED ORDER — GENTAMICIN SULFATE 40 MG/ML IJ SOLN
80.0000 mg | Freq: Once | INTRAMUSCULAR | Status: AC
  Administered 2016-01-23: 80 mg via INTRAMUSCULAR

## 2016-01-23 MED ORDER — LEVOFLOXACIN 500 MG PO TABS
500.0000 mg | ORAL_TABLET | Freq: Once | ORAL | Status: AC
  Administered 2016-01-23: 500 mg via ORAL

## 2016-01-23 NOTE — Progress Notes (Signed)
01/23/2016 8:44 AM   Daniel Knapp 21-May-1951 [id number]  Referring provider: No referring provider defined for this encounter.  No chief complaint on file.   HPI:  1. Prostate cancer ([place]) - minute focus Gleason 7 adenocarcinoma (44mm) incidetnal in TURP chips 08/2015. PSA 3.3 prior. TRUS / BX 01/2016 Vol 74 mL (likely less as significant TURP defect).  2. BPH (benign prostatic hyperplasia)  - s/ pTURP 08/2015 for med-refractory BPH and retention. Had serratia cystitis / epidiymitis peri-op form prolonged catheter that has resolved clinically.  Today " Daniel Knapp" is seen to proceed with prostate biopsy with goal of ruling out significant residual cancer. He took ABX and enemas as RX'd. He continues to void well after TURP.   PMH: Past Medical History:  Diagnosis Date  . Acute cystitis with hematuria   . HTN (hypertension)   . Prostate enlargement   . Urinary catheter insertion/adjustment/removal   . Urinary retention   . UTI (lower urinary tract infection)   . UTI symptoms   . Weight loss     Surgical History: Past Surgical History:  Procedure Laterality Date  . NO PAST SURGERIES    . TRANSURETHRAL RESECTION OF BLADDER TUMOR N/A 08/17/2015   Procedure: TRANSURETHRAL RESECTION OF BLADDER TUMOR (TURBT);  Surgeon: [name], MD;  Location: [facility];  Service: Urology;  Laterality: N/A;  . TRANSURETHRAL RESECTION OF PROSTATE N/A 08/17/2015   Procedure: TRANSURETHRAL RESECTION OF THE PROSTATE (TURP);  Surgeon: [name], MD;  Location: [facility] OR[facility];  Service: Urology;  Laterality: N/A;    Home Medications:    Medication List       Accurate as of 01/23/16  8:44 AM. Always use your most recent med list.          amLODipine 10 MG tablet Commonly known as:  NORVASC Take 10 mg by mouth daily.   ciprofloxacin 500 MG tablet Commonly known as:  CIPRO Take 1 tablet (500 mg total) by mouth every 12 (twelve) hours.   finasteride 5 MG tablet Commonly known as:   PROSCAR Take 5 mg by mouth daily. Reported on 08/17/2015   HYDROcodone-acetaminophen 5-325 MG tablet Commonly known as:  NORCO/VICODIN Take 1-2 tablets by mouth every 4 (four) hours as needed for moderate pain.   metoprolol tartrate 25 MG tablet Commonly known as:  LOPRESSOR Take 25 mg by mouth 2 (two) times daily. Reported on 08/17/2015   tamsulosin 0.4 MG Caps capsule Commonly known as:  FLOMAX Take 0.4 mg by mouth daily. Reported on 09/01/2015       Allergies: No Known Allergies  Family History: Family History  Problem Relation Age of Onset  . Cancer Father   . Heart disease Mother   . Kidney disease Neg Hx   . Prostate cancer Neg Hx     Social History:  reports that he has never smoked. He does not have any smokeless tobacco history on file. He reports that he drinks alcohol. He reports that he does not use drugs.     Review of Systems  Gastrointestinal (upper)  : Negative for upper GI symptoms  Gastrointestinal (lower) : Negative for lower GI symptoms  Constitutional : Negative for symptoms  Skin: Negative for skin symptoms  Eyes: Negative for eye symptoms  Ear/Nose/Throat : Negative for Ear/Nose/Throat symptoms  Hematologic/Lymphatic: Negative for Hematologic/Lymphatic symptoms  Cardiovascular : Negative for cardiovascular symptoms  Respiratory : Negative for respiratory symptoms  Endocrine: Negative for endocrine symptoms  Musculoskeletal: Negative for musculoskeletal symptoms  Neurological: Negative for neurological symptoms  Psychologic: Negative for psychiatric symptoms      Physical Exam: There were no vitals taken for this visit.  Constitutional:  Alert and oriented, No acute distress. HEENT: [place] AT, moist mucus membranes.  Trachea midline, no masses. Cardiovascular: No clubbing, cyanosis, or edema. Respiratory: Normal respiratory effort, no increased work of breathing. GI: Abdomen is soft, nontender, nondistended, no abdominal  masses GU: No CVA tenderness.  Skin: No rashes, bruises or suspicious lesions. Lymph: No cervical or inguinal adenopathy. Neurologic: Grossly intact, no focal deficits, moving all 4 extremities. Psychiatric: Normal mood and affect.  Prostate Biopsy Procedure   Informed consent was obtained after discussing risks/benefits of the procedure.  A time out was performed to ensure correct patient identity.  Pre-Procedure: - Last PSA Level: No results found for: PSA - Gentamicin given prophylactically - Levaquin 500 mg administered PO -Transrectal Ultrasound performed revealing a 76.92 gm prostate. But with medial TURP defect. -No significant hypoechoic or median lobe noted  Procedure: - Prostate block performed using 10 cc 1% lidocaine and biopsies taken from sextant areas, a total of 12 under ultrasound guidance.  Post-Procedure: - Patient tolerated the procedure well - He was counseled to seek immediate medical attention if experiences any severe pain, significant bleeding, or fevers - Return in one week to discuss biopsy results  Pertinent Imaging: none  Assessment & Plan:   1. Prostate cancer [facility]) - s/p biopsy today as per above. Warned to contact MD for high fever or urinary retention post-procedure.  RTC 1-2 weeks for results discussion.   2. BPH (benign prostatic hyperplasia) - doing well symptomatically s/p TURP.   No Follow-up on file.  Alexis Frock, [facility] [address] [place], [place] [address] [phone]

## 2016-01-27 ENCOUNTER — Other Ambulatory Visit: Payer: Medicare Other

## 2016-01-27 LAB — PATHOLOGY REPORT

## 2016-01-30 ENCOUNTER — Ambulatory Visit (INDEPENDENT_AMBULATORY_CARE_PROVIDER_SITE_OTHER): Payer: Medicare Other | Admitting: Urology

## 2016-01-30 ENCOUNTER — Encounter: Payer: Self-pay | Admitting: Urology

## 2016-01-30 VITALS — BP 155/91 | HR 85 | Ht 73.0 in | Wt 223.6 lb

## 2016-01-30 DIAGNOSIS — C61 Malignant neoplasm of prostate: Secondary | ICD-10-CM | POA: Diagnosis not present

## 2016-01-30 NOTE — Progress Notes (Signed)
F/u:  1) Prostate cancer - incidental finding of 1 mm segment of grade 3+4 prostate cancer in 0.01% of the 44 gm of prostatic tissue removed at 08/17/2015 TURP. His preop PSA was 3.3.   Prostate biopsy 01/23/2016: Prostate 77 grams (probably less given good TURP defect noted).  Gleason 3+3 equal 6, right apex, 6% Gleason 3+3 = 6 left mid, 7% Gleason 3+4 = 7, left base, 92%, grade 4 approaches 50%, +PNI Gleason 4+3 = 7, left lateral mid, 93% Gleason 4+3 = 7, left lateral base, 90%, +PNI            Comments:  Diagnosis:  A. RIGHT APEX; PROSTATIC ADENOCARCINOMA. GLEASON'S SCORE 6 (GRADES 3 + 3) NOTED IN 1 OUT OF 1; APPROXIMATELY 6% OF SUBMITTED TISSUE INVOLVED.  B. BENIGN PROSTATE TISSUE. NO EVIDENCE OF MALIGNANCY.  C. BENIGN PROSTATE TISSUE WITH FOCAL CHRONIC INFLAMMATION. NO EVIDENCE OF  MALIGNANCY.  D. BENIGN PROSTATE TISSUE WITH FOCAL CHRONIC INFLAMMATION. NO EVIDENCE OF  MALIGNANCY.  E. BENIGN PROSTATE TISSUE WITH FOCAL CHRONIC INFLAMMATION. NO EVIDENCE OF  MALIGNANCY.  F. BENIGN PROSTATE TISSUE. NO EVIDENCE OF MALIGNANCY.  G. PROSTATIC TISSUE CONTAINING A SMALL FOCUS OF ATYPICAL GLANDS SUSPICIOUS  BUT NOT DIAGNOSTIC OF ADENOCARCINOMA.  H. LEFT MID; PROSTATIC ADENOCARCINOMA. GLEASON'S SCORE 6 (GRADES 3 + 3) NOTED IN 1 OUT OF 1; APPROXIMATELY 7% OF SUBMITTED TISSUE INVOLVED. PERINEURAL INVASION IS PRESENT.  I. LEFT BASE; PROSTATIC ADENOCARCINOMA. GLEASON'S SCORE 7 (GRADES3 + 4) NOTED IN 1 OUT OF 1; APPROXIMATELY 92% OF SUBMITTED TISSUE INVOLVED. GLEASON GRADE 4 APPROACHES 50% OF THE TUMOR. PERINEURAL INVASION IS PRESENT.  J. BENIGN PROSTATE TISSUE WITH FOCAL CHRONIC INFLAMMATION. NO EVIDENCE OF  MALIGNANCY.  K. LEFT LAT MID; PROSTATIC ADENOCARCINOMA. GLEASON'S SCORE 7 (GRADES 4 + 3) NOTED IN 1; APPROXIMATELY 93% OF SUBMITTED TISSUE INVOLVED. GLEASON GRADE 4 COMPRISES 60% OF THE TUMOR. (GRADE GROUP 3)  L. LEFT LAT BASE; PROSTATIC ADENOCARCINOMA. GLEASON'S SCORE 7 (GRADES 4  + 3) NOTED IN 1 OUT OF 1; APPROXIMATELY 71% OF SUBMITTED TISSUE INVOLVED. GLEASON GRADE 4 COMPRISES 90% OF THE TUMOR. PERINEURAL INVASION IS PRESENT.   TxNxMx AUASS = 3 (low score on frequency and nocturia), QOL = 1 (pleased).  SHIM=16 (mild-moderate ED) ADAM questionnaire: 7. Are your erections less stong? Circled: Yes  2) BPH - pt presented with urinary retention and bilateral hydronpehrosis in 2016. He failed voiding trials despite tamsulosin and finasteride. He underwent TURP 08/17/2015. Current AUASS = 3.      A/P - Intermediate Risk prostate cancer -- T1bNxMx --  we don't know his current PSA level. Given this he needs complete staging. F/u with Dr. Erlene Quan for final review. Using the Living with prostate cancer booklet at a long discussion with the patient and the sister regarding the diagnosis of the prostate cancer based on biopsy of the peripheral zone and the diagnosis of prostate cancer based on the TURP and resection of the transition zone. We went over the anatomy. We discussed his stage, grade and prognosis. We discussed the nature, risks and benefits of surveillance, radical prostatectomy as well as external beam radiation. We also discussed the role of androgen deprivation (will use in conjunction with XRT and for metastatic disease).  We discussed specifically how each treatment might affect bowel, bladder and sexual function. We also discussed the role of HiFU or cryo. All questions answered.   Will schedule CT and bone scan and have him see Dr. Erlene Quan afterward. He'll need a PSA repeated in a  few more weeks before treatment.

## 2016-02-02 ENCOUNTER — Ambulatory Visit: Payer: Medicare Other

## 2016-02-10 ENCOUNTER — Other Ambulatory Visit: Payer: Self-pay

## 2016-02-10 DIAGNOSIS — C61 Malignant neoplasm of prostate: Secondary | ICD-10-CM

## 2016-02-10 HISTORY — DX: Malignant neoplasm of prostate: C61

## 2016-02-21 ENCOUNTER — Encounter
Admission: RE | Admit: 2016-02-21 | Discharge: 2016-02-21 | Disposition: A | Discharge status: Home / Self Care | Payer: Medicare Other | Source: Ambulatory Visit | Attending: Urology | Admitting: Urology

## 2016-02-21 ENCOUNTER — Ambulatory Visit: Admission: RE | Admit: 2016-02-21 | Payer: Medicare Other | Source: Ambulatory Visit

## 2016-02-21 ENCOUNTER — Ambulatory Visit
Admission: RE | Admit: 2016-02-21 | Discharge: 2016-02-21 | Disposition: A | Discharge status: Home / Self Care | Payer: Medicare Other | Source: Ambulatory Visit | Attending: Urology | Admitting: Urology

## 2016-02-21 DIAGNOSIS — I700 Atherosclerosis of aorta: Secondary | ICD-10-CM | POA: Diagnosis not present

## 2016-02-21 DIAGNOSIS — C61 Malignant neoplasm of prostate: Secondary | ICD-10-CM | POA: Insufficient documentation

## 2016-02-21 DIAGNOSIS — K5730 Diverticulosis of large intestine without perforation or abscess without bleeding: Secondary | ICD-10-CM | POA: Diagnosis not present

## 2016-02-21 DIAGNOSIS — R932 Abnormal findings on diagnostic imaging of liver and biliary tract: Secondary | ICD-10-CM | POA: Insufficient documentation

## 2016-02-21 DIAGNOSIS — R9341 Abnormal radiologic findings on diagnostic imaging of renal pelvis, ureter, or bladder: Secondary | ICD-10-CM | POA: Insufficient documentation

## 2016-02-21 DIAGNOSIS — K429 Umbilical hernia without obstruction or gangrene: Secondary | ICD-10-CM | POA: Insufficient documentation

## 2016-02-21 DIAGNOSIS — R102 Pelvic and perineal pain: Secondary | ICD-10-CM | POA: Diagnosis not present

## 2016-02-21 HISTORY — DX: Malignant neoplasm of prostate: C61

## 2016-02-21 LAB — POCT I-STAT CREATININE: Creatinine, Ser: 1 mg/dL (ref 0.61–1.24)

## 2016-02-21 MED ORDER — IOPAMIDOL (ISOVUE-300) INJECTION 61%
100.0000 mL | Freq: Once | INTRAVENOUS | Status: AC | PRN
  Administered 2016-02-21: 100 mL via INTRAVENOUS

## 2016-02-21 MED ORDER — TECHNETIUM TC 99M MEDRONATE IV KIT
23.3600 | PACK | Freq: Once | INTRAVENOUS | Status: AC | PRN
  Administered 2016-02-21: 23.36 via INTRAVENOUS

## 2016-03-02 ENCOUNTER — Ambulatory Visit (INDEPENDENT_AMBULATORY_CARE_PROVIDER_SITE_OTHER): Payer: Medicare Other | Admitting: Urology

## 2016-03-02 ENCOUNTER — Encounter: Payer: Self-pay | Admitting: Urology

## 2016-03-02 VITALS — BP 162/85 | HR 93 | Ht 73.0 in | Wt 225.8 lb

## 2016-03-02 DIAGNOSIS — N400 Enlarged prostate without lower urinary tract symptoms: Secondary | ICD-10-CM | POA: Diagnosis not present

## 2016-03-02 DIAGNOSIS — N529 Male erectile dysfunction, unspecified: Secondary | ICD-10-CM | POA: Diagnosis not present

## 2016-03-02 DIAGNOSIS — C61 Malignant neoplasm of prostate: Secondary | ICD-10-CM | POA: Diagnosis not present

## 2016-03-02 NOTE — Progress Notes (Signed)
03/02/2016 10:06 AM   Daniel Knapp Apr 25, 1951 [id number]  Referring provider: No referring provider defined for this encounter.  Chief Complaint  Patient presents with  . Results    Bone scan, CT    HPI: 1. Prostate cancer ([place]) - minute focus Gleason 7 adenocarcinoma (65mm) incidetnal in TURP chips 08/2015. PSA 3.3 prior. TRUS / BX 01/2016 Vol 74 mL (likely less as significant TURP defect).  Prostate biopsy confirms presence of high volume Gleason 4+3 disease as below.  Staging work up with bone scan and CT scan negative.    Prostate biopsy 01/23/2016: Prostate 77 grams (probably less given good TURP defect noted).  Gleason 3+3 equal 6, right apex, 6% Gleason 3+3 = 6 left mid, 7% Gleason 3+4 = 7, left base, 92%, grade 4 approaches 50%, +PNI Gleason 4+3 = 7, left lateral mid, 93% Gleason 4+3 = 7, left lateral base, 90%, +PNI   2. BPH (benign prostatic hyperplasia)  - s/ pTURP 08/2015 for med-refractory BPH and retention. Had serratia cystitis / epidiymitis peri-op form prolonged catheter that has resolved clinically.  Voiding well now after TURP.  3.  ED He does have baseline ED.  He is able to obtain an erection for the most part but states it is not as full as previously. He also occasionally has focally maintaining the erection and with ejaculation. He does not currently use any PDE 5 inhibitors.    Today " Daniel Knapp" is seen to discuss definitve management.    PMH: Past Medical History:  Diagnosis Date  . Acute cystitis with hematuria   . HTN (hypertension)   . Prostate cancer ([place]) 02/2016  . Prostate enlargement   . Urinary catheter insertion/adjustment/removal   . Urinary retention   . UTI (lower urinary tract infection)   . UTI symptoms   . Weight loss     Surgical History: Past Surgical History:  Procedure Laterality Date  . NO PAST SURGERIES    . TRANSURETHRAL RESECTION OF BLADDER TUMOR N/A 08/17/2015   Procedure: TRANSURETHRAL RESECTION OF BLADDER  TUMOR (TURBT);  Surgeon: Daniel Retort, MD;  Location: [facility] ORS;  Service: Urology;  Laterality: N/A;  . TRANSURETHRAL RESECTION OF PROSTATE N/A 08/17/2015   Procedure: TRANSURETHRAL RESECTION OF THE PROSTATE (TURP);  Surgeon: Daniel Retort, MD;  Location: [facility] ORS;  Service: Urology;  Laterality: N/A;    Home Medications:    Medication List       Accurate as of 03/02/16 10:06 AM. Always use your most recent med list.          amLODipine 10 MG tablet Commonly known as:  NORVASC Take 10 mg by mouth daily.   finasteride 5 MG tablet Commonly known as:  PROSCAR Take 5 mg by mouth daily. Reported on 08/17/2015   metoprolol tartrate 25 MG tablet Commonly known as:  LOPRESSOR Take 25 mg by mouth 2 (two) times daily. Reported on 08/17/2015   tamsulosin 0.4 MG Caps capsule Commonly known as:  FLOMAX Take 0.4 mg by mouth daily. Reported on 09/01/2015       Allergies: No Known Allergies  Family History: Family History  Problem Relation Age of Onset  . Cancer Father   . Heart disease Mother   . Kidney disease Neg Hx   . Prostate cancer Neg Hx     Social History:  reports that he has never smoked. He has never used smokeless tobacco. He reports that he drinks alcohol. He reports that he does not use drugs.  ROS: UROLOGY Frequent Urination?: Yes Hard to postpone urination?: No Burning/pain with urination?: No Get up at night to urinate?: Yes Leakage of urine?: No Urine stream starts and stops?: No Trouble starting stream?: No Do you have to strain to urinate?: No Blood in urine?: No Urinary tract infection?: No Sexually transmitted disease?: No Injury to kidneys or bladder?: No Painful intercourse?: No Weak stream?: No Erection problems?: No Penile pain?: No  Gastrointestinal Nausea?: No Vomiting?: No Indigestion/heartburn?: No Diarrhea?: No Constipation?: No  Constitutional Fever: No Night sweats?: No Weight loss?: No Fatigue?: No  Skin Skin  rash/lesions?: No Itching?: No  Eyes Blurred vision?: No Double vision?: No  Ears/Nose/Throat Sore throat?: No Sinus problems?: No  Hematologic/Lymphatic Swollen glands?: No Easy bruising?: No  Cardiovascular Leg swelling?: No Chest pain?: No  Respiratory Cough?: No Shortness of breath?: No  Endocrine Excessive thirst?: No  Musculoskeletal Back pain?: Yes Joint pain?: No  Neurological Headaches?: No Dizziness?: No  Psychologic Depression?: No Anxiety?: No  Physical Exam: BP (!) 162/85 (BP Location: Left Arm, Patient Position: Sitting, Cuff Size: Large)   Pulse 93   Ht 6\' 1"  (1.854 m)   Wt 225 lb 12.8 oz (102.4 kg)   BMI 29.79 kg/m   Constitutional:  Alert and oriented, No acute distress. HEENT: Daniel Knapp AT, moist mucus membranes.  Trachea midline, no masses. Cardiovascular: No clubbing, cyanosis, or edema. RRR. Respiratory: Normal respiratory effort, no increased work of breathing.  CTAB. GI: Abdomen is soft, nontender, nondistended, no abdominal masses.  No scars. Skin: No rashes, bruises or suspicious lesions. Lymph: No cervical or inguinal adenopathy. Neurologic: Grossly intact, no focal deficits, moving all 4 extremities. Psychiatric: Normal mood and affect.  Laboratory Data: Lab Results  Component Value Date   WBC 4.9 08/21/2015   HGB 7.9 (L) 08/21/2015   HCT 22.9 (L) 08/21/2015   MCV 92.7 08/21/2015   PLT 78 (L) 08/21/2015    Lab Results  Component Value Date   CREATININE 1.00 02/21/2016    No recent PSA s/p recent biopsy- most recent 3.3. On 11/2014   Pertinent Imaging: CLINICAL DATA:  Prostate cancer. Complains of left hip and pelvic pain for a few weeks.  EXAM: NUCLEAR MEDICINE WHOLE BODY BONE SCAN  TECHNIQUE: Whole body anterior and posterior images were obtained approximately 3 hours after intravenous injection of radiopharmaceutical.  RADIOPHARMACEUTICALS:  23.4 mCi Technetium-63m MDP IV  COMPARISON:  Abdominal CT  02/21/2016  FINDINGS: Dextroscoliosis in the lumbar spine with degenerative changes. Increased uptake in both shoulders are suggestive for degenerative disease. Increased uptake in both knees, left side greater than right, suggestive for degenerative changes. Degenerative changes in both hips. Expected uptake in the renal collecting system and urinary bladder. No suspicious uptake in the axial skeleton or ribs.  IMPRESSION: No evidence for metastatic bone disease.  Scattered degenerative disease as described. Left hip and pelvic pain may be associated with degenerative changes in the left hip and left knee.   Electronically Signed   By: Markus Daft M.D.   On: 02/21/2016 14:38  CLINICAL DATA:  Newly diagnosed prostate cancer, presenting for staging.  EXAM: CT ABDOMEN AND PELVIS WITH CONTRAST  TECHNIQUE: Multidetector CT imaging of the abdomen and pelvis was performed using the standard protocol following bolus administration of intravenous contrast.  CONTRAST:  129mL ISOVUE-300 IOPAMIDOL (ISOVUE-300) INJECTION 61%  COMPARISON:  09/09/2014 CT abdomen/pelvis.  FINDINGS: Lower chest: No significant pulmonary nodules or acute consolidative airspace disease.  Hepatobiliary: The liver surface appears diffusely finely irregular,  suggesting cirrhosis. No liver mass. Normal gallbladder with no radiopaque cholelithiasis. No biliary ductal dilatation.  Pancreas: Normal, with no mass or duct dilation.  Spleen: Normal size. No mass.  Adrenals/Urinary Tract: Normal adrenals. No hydronephrosis. Subcentimeter hypodense renal cortical lesions in the anterior interpolar and posterior upper left kidney are too small to characterize and require no further follow-up. No additional renal lesions. Normal bladder.  Stomach/Bowel: Grossly normal stomach. Normal caliber small bowel with no small bowel wall thickening. Normal appendix. Mild sigmoid diverticulosis, with no  large bowel wall thickening or pericolonic fat stranding.  Vascular/Lymphatic: Atherosclerotic nonaneurysmal abdominal aorta. Patent portal, splenic, hepatic and renal veins. No pathologically enlarged lymph nodes in the abdomen or pelvis.  Reproductive: Moderately enlarged prostate with irregular contour and prominent mass-effect on the bladder base. Prostate dimensions 6.5 x 4.7 x 6.2 cm (volume = 99 cm^3).  Other: No pneumoperitoneum, ascites or focal fluid collection. Moderate fat containing umbilical hernia.  Musculoskeletal: No aggressive appearing focal osseous lesions. Marked thoracolumbar spondylosis.  IMPRESSION: 1. No evidence of metastatic disease in the abdomen or pelvis. 2. Moderately enlarged irregular prostate with prominent mass-effect on the bladder base. 3. Liver surface appears diffusely finely irregular, suggesting cirrhosis. No liver mass. Recommend correlation with liver function tests. Consider hepatic elastography for further liver fibrosis risk stratification, as clinically warranted. 4. Additional findings include aortic atherosclerosis, moderate fat containing umbilical hernia and mild sigmoid diverticulosis.   Electronically Signed   By: Ilona Sorrel M.D.   On: 02/21/2016 12:09  CT scan and bone scan personally reviewed today   Assessment & Plan:    1. Prostate cancer [facility]) The patient was counseled about the natural history of prostate cancer and the standard treatment options that are available for prostate cancer. It was explained to him how his age and life expectancy, clinical stage, Gleason score, and PSA affect his prognosis, the decision to proceed with additional staging studies, as well as how that information influences recommended treatment strategies. We discussed the roles for active surveillance, radiation therapy, surgical therapy, androgen deprivation, as well as ablative therapy options for the treatment of prostate cancer  as appropriate to his individual cancer situation. We discussed the risks and benefits of these options with regard to their impact on cancer control and also in terms of potential adverse events, complications, and impact on quality of life particularly related to urinary, bowel, and sexual function. The patient was encouraged to ask questions throughout the discussion today and all questions were answered to his stated satisfaction. In addition, the patient was providedwith and/or directed to appropriate resources and literature for further education about prostate cancer treatment options.  We discussed surgical therapy for prostate cancer including the different available surgical approaches. We discussed, in detail, the risks and expectations of surgery with regard to cancer control, urinary control, and erectile dysfunction as well as expected post operative cover he processed. Additional risks of surgery including but not limalited to bleeding, infection, hernia formation, nerve damage, steel formation, bowel/rect injury, potentially necessitating colostomy, damage to the urinary tract resulting in urinary leakage, urethral stricture, and cardiopulmonary risk such as myocardial infarction, stroke, death, thromboembolism etc. were explained. The risk of open surgical conversion for robotics/laparoscopic prostatectomy is also discussed.  MSK nomogram reviewed.  8% risk of lymph node involvement, 8% risk of seminal vesicle invasion, 38% OCD, 59% ECE.    Given his age and overall relatively good health, I do feel that surgery is an excellent option. I have offered to  refer him to radiation oncology to discuss this option as well which he declined at this time. If he does elect to proceed with surgery, would not attempt to perform nerve sparing on the left side with the bulk of the disease is located. May consider right-sided nerve sparing. He will also need a pelvic lymph node dissection.  He is  accompanied by his sister today. All of his questions were answered at length.  Referral to PT preop.   2. BPH (benign prostatic hyperplasia) Enlarged prostate s/p TURP, voiding well currently  3. Erectile dysfunction, unspecified erectile dysfunction type Baseline ED We discussed complications of worsening erectile dysfunction and detail today. We also discussed options for intervention moving forward after prostatectomy including PDE 5 inhibitors, intracavernosal injection, and IPP.   He will call us back next week and let us know if he would like to proceed with surgery  Hollice Espy, MD  [place] [address] [place], [place] [address] [phone]  I spent 45 min with this patient of which greater than 50% was spent in counseling and coordination of care with the patient.

## 2016-03-05 ENCOUNTER — Telehealth: Payer: Self-pay

## 2016-03-05 DIAGNOSIS — C61 Malignant neoplasm of prostate: Secondary | ICD-10-CM

## 2016-03-05 NOTE — Telephone Encounter (Signed)
referal placed

## 2016-03-05 NOTE — Telephone Encounter (Signed)
Pt sister, Barnetta Chapel, called stating pt would like to have a consult with a radiation oncologist prior to making a decision about surgery. Please advise.

## 2016-03-07 ENCOUNTER — Telehealth: Payer: Self-pay | Admitting: Urology

## 2016-03-07 NOTE — Telephone Encounter (Signed)
Spoke with patient he was confused about why he needed a physical therapy apt, it was explained that this was for pelvic floor therapy if he decides to go ahead with surgery. Patient verbalized understanding and will see Dr. Delorse Limber on 03-14-16 and let our office know his decision.

## 2016-03-07 NOTE — Telephone Encounter (Signed)
Pt is confused about his referral for physical therapy.  Can you please give him a call and explain?

## 2016-03-14 ENCOUNTER — Encounter (INDEPENDENT_AMBULATORY_CARE_PROVIDER_SITE_OTHER): Payer: Self-pay

## 2016-03-14 ENCOUNTER — Ambulatory Visit
Admission: RE | Admit: 2016-03-14 | Discharge: 2016-03-14 | Disposition: A | Discharge status: Home / Self Care | Payer: Medicare HMO | Source: Ambulatory Visit | Attending: Radiation Oncology | Admitting: Radiation Oncology

## 2016-03-14 ENCOUNTER — Encounter: Payer: Self-pay | Admitting: Radiation Oncology

## 2016-03-14 VITALS — BP 157/97 | HR 95 | Temp 97.8°F | Resp 20 | Wt 226.2 lb

## 2016-03-14 DIAGNOSIS — R339 Retention of urine, unspecified: Secondary | ICD-10-CM | POA: Diagnosis not present

## 2016-03-14 DIAGNOSIS — C61 Malignant neoplasm of prostate: Secondary | ICD-10-CM | POA: Insufficient documentation

## 2016-03-14 DIAGNOSIS — N529 Male erectile dysfunction, unspecified: Secondary | ICD-10-CM | POA: Insufficient documentation

## 2016-03-14 DIAGNOSIS — Z87440 Personal history of urinary (tract) infections: Secondary | ICD-10-CM | POA: Diagnosis not present

## 2016-03-14 DIAGNOSIS — R634 Abnormal weight loss: Secondary | ICD-10-CM | POA: Insufficient documentation

## 2016-03-14 DIAGNOSIS — Z79899 Other long term (current) drug therapy: Secondary | ICD-10-CM | POA: Insufficient documentation

## 2016-03-14 DIAGNOSIS — I10 Essential (primary) hypertension: Secondary | ICD-10-CM | POA: Insufficient documentation

## 2016-03-14 NOTE — Consult Note (Signed)
NEW PATIENT EVALUATION  Name: Daniel Knapp  MRN: [id number]  Date:   03/14/2016     DOB: 07-10-1950   This 65 y.o. 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  REFERRING PHYSICIAN: Hollice Espy, MD  CHIEF COMPLAINT:  Chief Complaint  Patient presents with  . Prostate Cancer    Pt is here for initial consultation of radiation therapy    DIAGNOSIS: The encounter diagnosis was Malignant neoplasm of prostate ([place]).   PREVIOUS INVESTIGATIONS:  Pathology reports reviewed Bone scan has been ordered Clinical notes reviewed   HPI: Patient is a 65 year old male who presented with urinary retention and dependence and a Foley catheter and was set shown on cystoscopy tablet visually obstructive prostate. He underwent a TURP showing a small focus less than 5% of Gleason 7 (3+4) adenocarcinoma. He underwent repeat transrectal ultrasound-guided biopsy showing multiple focus actually 5 have out of 12 samples of adenocarcinoma 2 from the left lateral mid and left lateral base for Gleason 7 (4+3). His PSA is in a 3.3 range. Patient does have erectile dysfunction. He has a 74 cc prostate. Treatment options have been discussed with the patient. He has very little urinary symptoms now specifically denies urgency frequency. He has nocturia 2. No hematuria noted. He is seen today for radiation oncology opinion.  PLANNED TREATMENT REGIMEN: Probable robotic prostatectomy  PAST MEDICAL HISTORY:  has a past medical history of Acute cystitis with hematuria; HTN (hypertension); Prostate cancer ([place]) (02/2016); Prostate enlargement; Urinary catheter insertion/adjustment/removal; Urinary retention; UTI (lower urinary tract infection); UTI symptoms; and Weight loss.    PAST SURGICAL HISTORY:  Past Surgical History:  Procedure Laterality Date  . NO PAST SURGERIES     . TRANSURETHRAL RESECTION OF BLADDER TUMOR N/A 08/17/2015   Procedure: TRANSURETHRAL RESECTION OF BLADDER TUMOR (TURBT);  Surgeon: [name], MD;  Location: [facility] ORS;  Service: Urology;  Laterality: N/A;  . TRANSURETHRAL RESECTION OF PROSTATE N/A 08/17/2015   Procedure: TRANSURETHRAL RESECTION OF THE PROSTATE (TURP);  Surgeon: [name], MD;  Location: [facility] ORS;  Service: Urology;  Laterality: N/A;    FAMILY HISTORY: family history includes Cancer in his father; Heart disease in his mother.  SOCIAL HISTORY:  reports that he has never smoked. He has never used smokeless tobacco. He reports that he drinks alcohol. He reports that he does not use drugs.  ALLERGIES: Review of patient's allergies indicates no known allergies.  MEDICATIONS:  Current Outpatient Prescriptions  Medication Sig Dispense Refill  . amLODipine (NORVASC) 10 MG tablet Take 10 mg by mouth daily.    . tamsulosin (FLOMAX) 0.4 MG CAPS capsule Take 0.4 mg by mouth daily. Reported on 09/01/2015    . finasteride (PROSCAR) 5 MG tablet Take 5 mg by mouth daily. Reported on 08/17/2015    . metoprolol tartrate (LOPRESSOR) 25 MG tablet Take 25 mg by mouth 2 (two) times daily. Reported on 08/17/2015     Current Facility-Administered Medications  Medication Dose Route Frequency Provider Last Rate Last Dose  . lidocaine (XYLOCAINE) 2 % jelly 1 application  1 application Urethral Once Shannon A McGowan, PA-C        ECOG PERFORMANCE STATUS:  0 - Asymptomatic  REVIEW OF SYSTEMS:  Patient denies any weight loss, fatigue, weakness, fever, chills or night sweats. Patient denies any loss of vision, blurred vision. Patient denies  any ringing  of the ears or hearing loss. No irregular heartbeat. Patient denies heart murmur or history of fainting. Patient denies any chest pain or pain radiating to her upper extremities. Patient denies any shortness of breath, difficulty breathing at night, cough or hemoptysis. Patient denies any  swelling in the lower legs. Patient denies any nausea vomiting, vomiting of blood, or coffee ground material in the vomitus. Patient denies any stomach pain. Patient states has had normal bowel movements no significant constipation or diarrhea. Patient denies any dysuria, hematuria or significant nocturia. Patient denies any problems walking, swelling in the joints or loss of balance. Patient denies any skin changes, loss of hair or loss of weight. Patient denies any excessive worrying or anxiety or significant depression. Patient denies any problems with insomnia. Patient denies excessive thirst, polyuria, polydipsia. Patient denies any swollen glands, patient denies easy bruising or easy bleeding. Patient denies any recent infections, allergies or URI. Patient "s visual fields have not changed significantly in recent time.    PHYSICAL EXAM: BP (!) 157/97   Pulse 95   Temp 97.8 F (36.6 C)   Resp 20   Wt 226 lb 3.1 oz (102.6 kg)   BMI 29.84 kg/m  On rectal exam rectal sphincter tone is good prostate is enlarged some asymmetry more prominent in the left lateral lobe. Sulcus is preserved bilaterally. No other rectal abnormality is identified. Well-developed well-nourished patient in NAD. HEENT reveals PERLA, EOMI, discs not visualized.  Oral cavity is clear. No oral mucosal lesions are identified. Neck is clear without evidence of cervical or supraclavicular adenopathy. Lungs are clear to A&P. Cardiac examination is essentially unremarkable with regular rate and rhythm without murmur rub or thrill. Abdomen is benign with no organomegaly or masses noted. Motor sensory and DTR levels are equal and symmetric in the upper and lower extremities. Cranial nerves II through XII are grossly intact. Proprioception is intact. No peripheral adenopathy or edema is identified. No motor or sensory levels are noted. Crude visual fields are within normal range.  LABORATORY DATA: Pathology reports reviewed     RADIOLOGY RESULTS: Bone scan has been ordered   IMPRESSION: Stage IIa adenocarcinoma the prostate in 65 year old male  PLAN: At this time I have run the [facility]. Patient is a low likelihood of lymph node involvement around 8% although there is probability of 60% extracapsular extension. Based on his overall general condition and age I believe he would be best served with a robotic prostatectomy. I would make further recommendations about salvage radiation therapy depending on the pathology findings and postoperative PSA. Patient is not a candidate for seed implantation based on his TURP defect and large volume of his prostate. External beam I am RT radiation therapy would also be an option. Risks and benefits of all procedures again were reviewed with the patient and his brother and they both seem to comprehend my rat rationale and reasoning well. Patient is still somewhat reluctant about surgery although I am pushing him in that direction based on his good health and young age. I have set up a follow-up point with urology next week. If patient decides not to go ahead with surgery would treat him with image guided I MRT radiation therapy. I also believe based on the Gleason 7 score young age she is not a candidate for watchful waiting.  I would like to take this opportunity to thank you for allowing me to participate in the care of your patient.Armstead Peaks., MD

## 2016-03-21 ENCOUNTER — Ambulatory Visit: Payer: Medicare Other | Admitting: Urology

## 2016-03-21 ENCOUNTER — Ambulatory Visit
Admission: RE | Admit: 2016-03-21 | Discharge: 2016-03-21 | Disposition: A | Discharge status: Home / Self Care | Payer: Medicare HMO | Source: Ambulatory Visit | Attending: Radiation Oncology | Admitting: Radiation Oncology

## 2016-03-21 ENCOUNTER — Encounter: Payer: Self-pay | Admitting: Radiation Oncology

## 2016-03-21 VITALS — BP 167/89 | HR 89 | Temp 97.7°F | Resp 20 | Wt 227.6 lb

## 2016-03-21 DIAGNOSIS — C61 Malignant neoplasm of prostate: Secondary | ICD-10-CM | POA: Insufficient documentation

## 2016-03-21 DIAGNOSIS — Z510 Encounter for antineoplastic radiation therapy: Secondary | ICD-10-CM | POA: Insufficient documentation

## 2016-03-21 NOTE — Progress Notes (Signed)
Radiation Oncology Follow up Note  Name: Daniel Knapp   Date:   03/21/2016 MRN:  [id number] DOB: Jan 19, 1951    This 65 y.o. male presents to the clinic today for further discussion regarding his prostate cancer and treatment options.  REFERRING PROVIDER: No ref. provider found  HPI: Patient is a 65 year old male right consult back October 4 for stage IIa adenocarcinoma the prostate (T1 1 NX M0) Gleason 7 (4+3) in presenting with his cancer detected in prostatic chips PSA was 3.3.. He has a 74 cc as well as a TURP defect making seed implantation contraindicated. We will over treatment options and my strong recommendation was for robotic prostatectomy although the patient has declined that is seen today for consideration of starting external beam radiation therapy. He continues to do well otherwise. prostate  COMPLICATIONS OF TREATMENT: none  FOLLOW UP COMPLIANCE: keeps appointments   PHYSICAL EXAM:  BP (!) 167/89   Pulse 89   Temp 97.7 F (36.5 C)   Resp 20   Wt 227 lb 10 oz (103.3 kg)   BMI 30.03 kg/m  Well-developed well-nourished patient in NAD. HEENT reveals PERLA, EOMI, discs not visualized.  Oral cavity is clear. No oral mucosal lesions are identified. Neck is clear without evidence of cervical or supraclavicular adenopathy. Lungs are clear to A&P. Cardiac examination is essentially unremarkable with regular rate and rhythm without murmur rub or thrill. Abdomen is benign with no organomegaly or masses noted. Motor sensory and DTR levels are equal and symmetric in the upper and lower extremities. Cranial nerves II through XII are grossly intact. Proprioception is intact. No peripheral adenopathy or edema is identified. No motor or sensory levels are noted. Crude visual fields are within normal range.  RADIOLOGY RESULTS: No current films for review  PLAN: At this time I once again gone over the risks and benefits of radiation therapy using image guided I MRT radiation therapy. I  would plan on delivering 8000 cGy over 8 weeks. I have asked urology to placed fiduciary markers for daily image guided treatment. Side effects such as increased lower urinary tract symptoms, diarrhea, fatigue, skin reaction and alteration of blood counts all were discussed in detail with the patient and his wife. They both seem to comprehend my treatment plan well. I have set him up for CT simulation a few days after his markers are placed.  I would like to take this opportunity to thank you for allowing me to participate in the care of your patient.Armstead Peaks., MD

## 2016-04-10 ENCOUNTER — Other Ambulatory Visit: Payer: Self-pay | Admitting: Family Medicine

## 2016-04-10 DIAGNOSIS — B182 Chronic viral hepatitis C: Secondary | ICD-10-CM

## 2016-04-13 ENCOUNTER — Ambulatory Visit
Admission: RE | Admit: 2016-04-13 | Discharge: 2016-04-13 | Disposition: A | Discharge status: Home / Self Care | Payer: Medicare HMO | Source: Ambulatory Visit | Attending: Family Medicine | Admitting: Family Medicine

## 2016-04-13 DIAGNOSIS — K8020 Calculus of gallbladder without cholecystitis without obstruction: Secondary | ICD-10-CM | POA: Diagnosis not present

## 2016-04-13 DIAGNOSIS — B182 Chronic viral hepatitis C: Secondary | ICD-10-CM | POA: Diagnosis not present

## 2016-04-25 ENCOUNTER — Ambulatory Visit (INDEPENDENT_AMBULATORY_CARE_PROVIDER_SITE_OTHER): Payer: Medicare HMO | Admitting: Urology

## 2016-04-25 VITALS — BP 141/90 | HR 83 | Ht 73.0 in | Wt 230.0 lb

## 2016-04-25 DIAGNOSIS — N401 Enlarged prostate with lower urinary tract symptoms: Secondary | ICD-10-CM

## 2016-04-25 DIAGNOSIS — C61 Malignant neoplasm of prostate: Secondary | ICD-10-CM | POA: Diagnosis not present

## 2016-04-25 DIAGNOSIS — N138 Other obstructive and reflux uropathy: Secondary | ICD-10-CM

## 2016-04-25 MED ORDER — LEVOFLOXACIN 500 MG PO TABS
500.0000 mg | ORAL_TABLET | Freq: Once | ORAL | Status: AC
  Administered 2016-04-25: 500 mg via ORAL

## 2016-04-25 MED ORDER — GENTAMICIN SULFATE 40 MG/ML IJ SOLN
80.0000 mg | Freq: Once | INTRAMUSCULAR | Status: AC
  Administered 2016-04-25: 80 mg via INTRAMUSCULAR

## 2016-04-25 NOTE — Progress Notes (Signed)
   04/25/16  CC:  Chief Complaint  Patient presents with  . Gold Seed Placement    HPI: 65 y.o. male with prostate cancer who presents today for placement of fiducial seed markers in anticipation of his upcoming IMRT with Dr. Baruch Gouty.  Prostate Gold Seed Marker Placement Procedure   Informed consent was obtained after discussing risks/benefits of the procedure.  A time out was performed to ensure correct patient identity.  Pre-Procedure: - Gentamicin given prophylactically - PO Levaquin 500 mg also given today  Procedure: -Lidocaine jelly was administered per rectum -Rectal ultrasound probe was placed without difficulty and the prostate visualized - 3 fiducial gold seed markers placed, one at right base, one at left base, one at apex of prostate gland under transrectal ultrasound guidance  Post-Procedure: - Patient tolerated the procedure well - He was counseled to seek immediate medical attention if experiences any severe pain, significant bleeding, or fevers   Post-Procedure: - Patient tolerated the procedure well  Assessment/ Plan:  1. Prostate cancer Incidentally diagnosed prostate cancer on TURP pathology, PSA 3.3 prior. More recent prostate biopsy and 01/23/2016 with 4+3, high-volume at the left lateral mid and base involving a total of 5/12 biopsy specimens.  He is elected external beam radiation under the care of Dr. Baruch Gouty.  Fiducial seeds placed today under transrectal ultrasound guidance.  Follow up in 6 months.  Hollice Espy, MD

## 2016-04-26 ENCOUNTER — Telehealth: Payer: Self-pay | Admitting: Urology

## 2016-04-30 ENCOUNTER — Ambulatory Visit
Admission: RE | Admit: 2016-04-30 | Discharge: 2016-04-30 | Disposition: A | Discharge status: Home / Self Care | Payer: Medicare HMO | Source: Ambulatory Visit | Attending: Radiation Oncology | Admitting: Radiation Oncology

## 2016-04-30 DIAGNOSIS — C61 Malignant neoplasm of prostate: Secondary | ICD-10-CM | POA: Diagnosis not present

## 2016-04-30 DIAGNOSIS — Z510 Encounter for antineoplastic radiation therapy: Secondary | ICD-10-CM | POA: Diagnosis present

## 2016-05-07 DIAGNOSIS — Z510 Encounter for antineoplastic radiation therapy: Secondary | ICD-10-CM | POA: Diagnosis not present

## 2016-05-07 NOTE — Telephone Encounter (Signed)
error 

## 2016-05-09 ENCOUNTER — Other Ambulatory Visit: Payer: Self-pay | Admitting: *Deleted

## 2016-05-09 DIAGNOSIS — C61 Malignant neoplasm of prostate: Secondary | ICD-10-CM

## 2016-05-15 ENCOUNTER — Ambulatory Visit
Admission: RE | Admit: 2016-05-15 | Discharge: 2016-05-15 | Disposition: A | Discharge status: Home / Self Care | Payer: Medicare HMO | Source: Ambulatory Visit | Attending: Radiation Oncology | Admitting: Radiation Oncology

## 2016-05-16 ENCOUNTER — Ambulatory Visit
Admission: RE | Admit: 2016-05-16 | Discharge: 2016-05-16 | Disposition: A | Discharge status: Home / Self Care | Payer: Medicare HMO | Source: Ambulatory Visit | Attending: Radiation Oncology | Admitting: Radiation Oncology

## 2016-05-16 DIAGNOSIS — Z510 Encounter for antineoplastic radiation therapy: Secondary | ICD-10-CM | POA: Diagnosis not present

## 2016-05-17 ENCOUNTER — Ambulatory Visit
Admission: RE | Admit: 2016-05-17 | Discharge: 2016-05-17 | Disposition: A | Discharge status: Home / Self Care | Payer: Medicare HMO | Source: Ambulatory Visit | Attending: Radiation Oncology | Admitting: Radiation Oncology

## 2016-05-17 DIAGNOSIS — Z510 Encounter for antineoplastic radiation therapy: Secondary | ICD-10-CM | POA: Diagnosis not present

## 2016-05-18 ENCOUNTER — Ambulatory Visit
Admission: RE | Admit: 2016-05-18 | Discharge: 2016-05-18 | Disposition: A | Discharge status: Home / Self Care | Payer: Medicare HMO | Source: Ambulatory Visit | Attending: Radiation Oncology | Admitting: Radiation Oncology

## 2016-05-18 DIAGNOSIS — Z510 Encounter for antineoplastic radiation therapy: Secondary | ICD-10-CM | POA: Diagnosis not present

## 2016-05-21 ENCOUNTER — Ambulatory Visit
Admission: RE | Admit: 2016-05-21 | Discharge: 2016-05-21 | Disposition: A | Discharge status: Home / Self Care | Payer: Medicare HMO | Source: Ambulatory Visit | Attending: Radiation Oncology | Admitting: Radiation Oncology

## 2016-05-21 DIAGNOSIS — Z510 Encounter for antineoplastic radiation therapy: Secondary | ICD-10-CM | POA: Diagnosis not present

## 2016-05-22 ENCOUNTER — Ambulatory Visit
Admission: RE | Admit: 2016-05-22 | Discharge: 2016-05-22 | Disposition: A | Discharge status: Home / Self Care | Payer: Medicare HMO | Source: Ambulatory Visit | Attending: Radiation Oncology | Admitting: Radiation Oncology

## 2016-05-22 DIAGNOSIS — Z510 Encounter for antineoplastic radiation therapy: Secondary | ICD-10-CM | POA: Diagnosis not present

## 2016-05-23 ENCOUNTER — Ambulatory Visit
Admission: RE | Admit: 2016-05-23 | Discharge: 2016-05-23 | Disposition: A | Discharge status: Home / Self Care | Payer: Medicare HMO | Source: Ambulatory Visit | Attending: Radiation Oncology | Admitting: Radiation Oncology

## 2016-05-23 DIAGNOSIS — Z510 Encounter for antineoplastic radiation therapy: Secondary | ICD-10-CM | POA: Diagnosis not present

## 2016-05-24 ENCOUNTER — Ambulatory Visit
Admission: RE | Admit: 2016-05-24 | Discharge: 2016-05-24 | Disposition: A | Discharge status: Home / Self Care | Payer: Medicare HMO | Source: Ambulatory Visit | Attending: Radiation Oncology | Admitting: Radiation Oncology

## 2016-05-24 DIAGNOSIS — Z510 Encounter for antineoplastic radiation therapy: Secondary | ICD-10-CM | POA: Diagnosis not present

## 2016-05-25 ENCOUNTER — Ambulatory Visit
Admission: RE | Admit: 2016-05-25 | Discharge: 2016-05-25 | Disposition: A | Discharge status: Home / Self Care | Payer: Medicare HMO | Source: Ambulatory Visit | Attending: Radiation Oncology | Admitting: Radiation Oncology

## 2016-05-25 DIAGNOSIS — Z510 Encounter for antineoplastic radiation therapy: Secondary | ICD-10-CM | POA: Diagnosis not present

## 2016-05-28 ENCOUNTER — Ambulatory Visit
Admission: RE | Admit: 2016-05-28 | Discharge: 2016-05-28 | Disposition: A | Discharge status: Home / Self Care | Payer: Medicare HMO | Source: Ambulatory Visit | Attending: Radiation Oncology | Admitting: Radiation Oncology

## 2016-05-28 DIAGNOSIS — Z510 Encounter for antineoplastic radiation therapy: Secondary | ICD-10-CM | POA: Diagnosis not present

## 2016-05-29 ENCOUNTER — Ambulatory Visit
Admission: RE | Admit: 2016-05-29 | Discharge: 2016-05-29 | Disposition: A | Discharge status: Home / Self Care | Payer: Medicare HMO | Source: Ambulatory Visit | Attending: Radiation Oncology | Admitting: Radiation Oncology

## 2016-05-29 DIAGNOSIS — Z510 Encounter for antineoplastic radiation therapy: Secondary | ICD-10-CM | POA: Diagnosis not present

## 2016-05-30 ENCOUNTER — Inpatient Hospital Stay: Payer: Medicare HMO | Attending: Radiation Oncology

## 2016-05-30 ENCOUNTER — Ambulatory Visit
Admission: RE | Admit: 2016-05-30 | Discharge: 2016-05-30 | Disposition: A | Discharge status: Home / Self Care | Payer: Medicare HMO | Source: Ambulatory Visit | Attending: Radiation Oncology | Admitting: Radiation Oncology

## 2016-05-30 DIAGNOSIS — C61 Malignant neoplasm of prostate: Secondary | ICD-10-CM

## 2016-05-30 DIAGNOSIS — Z510 Encounter for antineoplastic radiation therapy: Secondary | ICD-10-CM | POA: Diagnosis not present

## 2016-05-30 LAB — CBC
HCT: 44.6 % (ref 40.0–52.0)
Hemoglobin: 15.6 g/dL (ref 13.0–18.0)
MCH: 31.8 pg (ref 26.0–34.0)
MCHC: 34.9 g/dL (ref 32.0–36.0)
MCV: 91 fL (ref 80.0–100.0)
PLATELETS: 113 10*3/uL — AB (ref 150–440)
RBC: 4.9 MIL/uL (ref 4.40–5.90)
RDW: 12.8 % (ref 11.5–14.5)
WBC: 4 10*3/uL (ref 3.8–10.6)

## 2016-05-31 ENCOUNTER — Ambulatory Visit
Admission: RE | Admit: 2016-05-31 | Discharge: 2016-05-31 | Disposition: A | Discharge status: Home / Self Care | Payer: Medicare HMO | Source: Ambulatory Visit | Attending: Radiation Oncology | Admitting: Radiation Oncology

## 2016-05-31 DIAGNOSIS — Z510 Encounter for antineoplastic radiation therapy: Secondary | ICD-10-CM | POA: Diagnosis not present

## 2016-06-01 ENCOUNTER — Ambulatory Visit
Admission: RE | Admit: 2016-06-01 | Discharge: 2016-06-01 | Disposition: A | Discharge status: Home / Self Care | Payer: Medicare HMO | Source: Ambulatory Visit | Attending: Radiation Oncology | Admitting: Radiation Oncology

## 2016-06-01 DIAGNOSIS — Z510 Encounter for antineoplastic radiation therapy: Secondary | ICD-10-CM | POA: Diagnosis not present

## 2016-06-05 ENCOUNTER — Ambulatory Visit
Admission: RE | Admit: 2016-06-05 | Discharge: 2016-06-05 | Disposition: A | Discharge status: Home / Self Care | Payer: Medicare HMO | Source: Ambulatory Visit | Attending: Radiation Oncology | Admitting: Radiation Oncology

## 2016-06-05 DIAGNOSIS — Z510 Encounter for antineoplastic radiation therapy: Secondary | ICD-10-CM | POA: Diagnosis not present

## 2016-06-06 ENCOUNTER — Ambulatory Visit
Admission: RE | Admit: 2016-06-06 | Discharge: 2016-06-06 | Disposition: A | Discharge status: Home / Self Care | Payer: Medicare HMO | Source: Ambulatory Visit | Attending: Radiation Oncology | Admitting: Radiation Oncology

## 2016-06-06 DIAGNOSIS — Z510 Encounter for antineoplastic radiation therapy: Secondary | ICD-10-CM | POA: Diagnosis not present

## 2016-06-07 ENCOUNTER — Ambulatory Visit
Admission: RE | Admit: 2016-06-07 | Discharge: 2016-06-07 | Disposition: A | Discharge status: Home / Self Care | Payer: Medicare HMO | Source: Ambulatory Visit | Attending: Radiation Oncology | Admitting: Radiation Oncology

## 2016-06-07 DIAGNOSIS — Z510 Encounter for antineoplastic radiation therapy: Secondary | ICD-10-CM | POA: Diagnosis not present

## 2016-06-08 ENCOUNTER — Ambulatory Visit
Admission: RE | Admit: 2016-06-08 | Discharge: 2016-06-08 | Disposition: A | Discharge status: Home / Self Care | Payer: Medicare HMO | Source: Ambulatory Visit | Attending: Radiation Oncology | Admitting: Radiation Oncology

## 2016-06-08 DIAGNOSIS — Z510 Encounter for antineoplastic radiation therapy: Secondary | ICD-10-CM | POA: Diagnosis not present

## 2016-06-12 ENCOUNTER — Ambulatory Visit
Admission: RE | Admit: 2016-06-12 | Discharge: 2016-06-12 | Disposition: A | Discharge status: Home / Self Care | Payer: Medicare HMO | Source: Ambulatory Visit | Attending: Radiation Oncology | Admitting: Radiation Oncology

## 2016-06-12 DIAGNOSIS — Z510 Encounter for antineoplastic radiation therapy: Secondary | ICD-10-CM | POA: Diagnosis not present

## 2016-06-13 ENCOUNTER — Ambulatory Visit
Admission: RE | Admit: 2016-06-13 | Discharge: 2016-06-13 | Disposition: A | Discharge status: Home / Self Care | Payer: Medicare HMO | Source: Ambulatory Visit | Attending: Radiation Oncology | Admitting: Radiation Oncology

## 2016-06-13 ENCOUNTER — Inpatient Hospital Stay: Payer: Medicare HMO | Attending: Radiation Oncology

## 2016-06-13 ENCOUNTER — Other Ambulatory Visit: Payer: Self-pay

## 2016-06-13 DIAGNOSIS — C61 Malignant neoplasm of prostate: Secondary | ICD-10-CM | POA: Insufficient documentation

## 2016-06-13 DIAGNOSIS — Z510 Encounter for antineoplastic radiation therapy: Secondary | ICD-10-CM | POA: Diagnosis not present

## 2016-06-13 LAB — CBC
HCT: 44.1 % (ref 40.0–52.0)
Hemoglobin: 15.5 g/dL (ref 13.0–18.0)
MCH: 32 pg (ref 26.0–34.0)
MCHC: 35.1 g/dL (ref 32.0–36.0)
MCV: 91.2 fL (ref 80.0–100.0)
PLATELETS: 109 10*3/uL — AB (ref 150–440)
RBC: 4.84 MIL/uL (ref 4.40–5.90)
RDW: 12.8 % (ref 11.5–14.5)
WBC: 3.8 10*3/uL (ref 3.8–10.6)

## 2016-06-14 ENCOUNTER — Ambulatory Visit
Admission: RE | Admit: 2016-06-14 | Discharge: 2016-06-14 | Disposition: A | Discharge status: Home / Self Care | Payer: Medicare HMO | Source: Ambulatory Visit | Attending: Radiation Oncology | Admitting: Radiation Oncology

## 2016-06-14 DIAGNOSIS — Z510 Encounter for antineoplastic radiation therapy: Secondary | ICD-10-CM | POA: Diagnosis not present

## 2016-06-15 ENCOUNTER — Ambulatory Visit
Admission: RE | Admit: 2016-06-15 | Discharge: 2016-06-15 | Disposition: A | Discharge status: Home / Self Care | Payer: Medicare HMO | Source: Ambulatory Visit | Attending: Radiation Oncology | Admitting: Radiation Oncology

## 2016-06-15 DIAGNOSIS — Z510 Encounter for antineoplastic radiation therapy: Secondary | ICD-10-CM | POA: Diagnosis not present

## 2016-06-18 ENCOUNTER — Ambulatory Visit
Admission: RE | Admit: 2016-06-18 | Discharge: 2016-06-18 | Disposition: A | Discharge status: Home / Self Care | Payer: Medicare HMO | Source: Ambulatory Visit | Attending: Radiation Oncology | Admitting: Radiation Oncology

## 2016-06-18 DIAGNOSIS — Z510 Encounter for antineoplastic radiation therapy: Secondary | ICD-10-CM | POA: Diagnosis not present

## 2016-06-19 ENCOUNTER — Ambulatory Visit
Admission: RE | Admit: 2016-06-19 | Discharge: 2016-06-19 | Disposition: A | Discharge status: Home / Self Care | Payer: Medicare HMO | Source: Ambulatory Visit | Attending: Radiation Oncology | Admitting: Radiation Oncology

## 2016-06-19 DIAGNOSIS — Z510 Encounter for antineoplastic radiation therapy: Secondary | ICD-10-CM | POA: Diagnosis not present

## 2016-06-20 ENCOUNTER — Ambulatory Visit
Admission: RE | Admit: 2016-06-20 | Discharge: 2016-06-20 | Disposition: A | Discharge status: Home / Self Care | Payer: Medicare HMO | Source: Ambulatory Visit | Attending: Radiation Oncology | Admitting: Radiation Oncology

## 2016-06-20 DIAGNOSIS — Z510 Encounter for antineoplastic radiation therapy: Secondary | ICD-10-CM | POA: Diagnosis not present

## 2016-06-21 ENCOUNTER — Ambulatory Visit
Admission: RE | Admit: 2016-06-21 | Discharge: 2016-06-21 | Disposition: A | Discharge status: Home / Self Care | Payer: Medicare HMO | Source: Ambulatory Visit | Attending: Radiation Oncology | Admitting: Radiation Oncology

## 2016-06-21 DIAGNOSIS — Z510 Encounter for antineoplastic radiation therapy: Secondary | ICD-10-CM | POA: Diagnosis not present

## 2016-06-22 ENCOUNTER — Ambulatory Visit
Admission: RE | Admit: 2016-06-22 | Discharge: 2016-06-22 | Disposition: A | Discharge status: Home / Self Care | Payer: Medicare HMO | Source: Ambulatory Visit | Attending: Radiation Oncology | Admitting: Radiation Oncology

## 2016-06-22 DIAGNOSIS — Z510 Encounter for antineoplastic radiation therapy: Secondary | ICD-10-CM | POA: Diagnosis not present

## 2016-06-25 ENCOUNTER — Ambulatory Visit
Admission: RE | Admit: 2016-06-25 | Discharge: 2016-06-25 | Disposition: A | Discharge status: Home / Self Care | Payer: Medicare HMO | Source: Ambulatory Visit | Attending: Radiation Oncology | Admitting: Radiation Oncology

## 2016-06-25 DIAGNOSIS — Z510 Encounter for antineoplastic radiation therapy: Secondary | ICD-10-CM | POA: Diagnosis not present

## 2016-06-26 ENCOUNTER — Ambulatory Visit
Admission: RE | Admit: 2016-06-26 | Discharge: 2016-06-26 | Disposition: A | Discharge status: Home / Self Care | Payer: Medicare HMO | Source: Ambulatory Visit | Attending: Radiation Oncology | Admitting: Radiation Oncology

## 2016-06-26 DIAGNOSIS — Z510 Encounter for antineoplastic radiation therapy: Secondary | ICD-10-CM | POA: Diagnosis not present

## 2016-06-27 ENCOUNTER — Inpatient Hospital Stay: Payer: Medicare HMO

## 2016-06-27 ENCOUNTER — Ambulatory Visit: Payer: Medicare HMO

## 2016-06-28 ENCOUNTER — Ambulatory Visit: Payer: Medicare HMO

## 2016-06-29 ENCOUNTER — Ambulatory Visit
Admission: RE | Admit: 2016-06-29 | Discharge: 2016-06-29 | Disposition: A | Discharge status: Home / Self Care | Payer: Medicare HMO | Source: Ambulatory Visit | Attending: Radiation Oncology | Admitting: Radiation Oncology

## 2016-06-29 DIAGNOSIS — Z510 Encounter for antineoplastic radiation therapy: Secondary | ICD-10-CM | POA: Diagnosis not present

## 2016-07-02 ENCOUNTER — Ambulatory Visit
Admission: RE | Admit: 2016-07-02 | Discharge: 2016-07-02 | Disposition: A | Discharge status: Home / Self Care | Payer: Medicare HMO | Source: Ambulatory Visit | Attending: Radiation Oncology | Admitting: Radiation Oncology

## 2016-07-02 DIAGNOSIS — Z510 Encounter for antineoplastic radiation therapy: Secondary | ICD-10-CM | POA: Diagnosis not present

## 2016-07-03 ENCOUNTER — Ambulatory Visit
Admission: RE | Admit: 2016-07-03 | Discharge: 2016-07-03 | Disposition: A | Discharge status: Home / Self Care | Payer: Medicare HMO | Source: Ambulatory Visit | Attending: Radiation Oncology | Admitting: Radiation Oncology

## 2016-07-03 ENCOUNTER — Inpatient Hospital Stay: Payer: Medicare HMO

## 2016-07-03 DIAGNOSIS — C61 Malignant neoplasm of prostate: Secondary | ICD-10-CM

## 2016-07-03 DIAGNOSIS — Z510 Encounter for antineoplastic radiation therapy: Secondary | ICD-10-CM | POA: Diagnosis not present

## 2016-07-03 LAB — CBC
HCT: 43.3 % (ref 40.0–52.0)
Hemoglobin: 15.3 g/dL (ref 13.0–18.0)
MCH: 32.1 pg (ref 26.0–34.0)
MCHC: 35.3 g/dL (ref 32.0–36.0)
MCV: 91 fL (ref 80.0–100.0)
PLATELETS: 116 10*3/uL — AB (ref 150–440)
RBC: 4.76 MIL/uL (ref 4.40–5.90)
RDW: 12.7 % (ref 11.5–14.5)
WBC: 3.4 10*3/uL — ABNORMAL LOW (ref 3.8–10.6)

## 2016-07-04 ENCOUNTER — Ambulatory Visit
Admission: RE | Admit: 2016-07-04 | Discharge: 2016-07-04 | Disposition: A | Discharge status: Home / Self Care | Payer: Medicare HMO | Source: Ambulatory Visit | Attending: Radiation Oncology | Admitting: Radiation Oncology

## 2016-07-04 DIAGNOSIS — Z510 Encounter for antineoplastic radiation therapy: Secondary | ICD-10-CM | POA: Diagnosis not present

## 2016-07-05 ENCOUNTER — Ambulatory Visit
Admission: RE | Admit: 2016-07-05 | Discharge: 2016-07-05 | Disposition: A | Discharge status: Home / Self Care | Payer: Medicare HMO | Source: Ambulatory Visit | Attending: Radiation Oncology | Admitting: Radiation Oncology

## 2016-07-05 DIAGNOSIS — Z510 Encounter for antineoplastic radiation therapy: Secondary | ICD-10-CM | POA: Diagnosis not present

## 2016-07-06 ENCOUNTER — Ambulatory Visit
Admission: RE | Admit: 2016-07-06 | Discharge: 2016-07-06 | Disposition: A | Discharge status: Home / Self Care | Payer: Medicare HMO | Source: Ambulatory Visit | Attending: Radiation Oncology | Admitting: Radiation Oncology

## 2016-07-06 DIAGNOSIS — Z510 Encounter for antineoplastic radiation therapy: Secondary | ICD-10-CM | POA: Diagnosis not present

## 2016-07-09 ENCOUNTER — Ambulatory Visit
Admission: RE | Admit: 2016-07-09 | Discharge: 2016-07-09 | Disposition: A | Discharge status: Home / Self Care | Payer: Medicare HMO | Source: Ambulatory Visit | Attending: Radiation Oncology | Admitting: Radiation Oncology

## 2016-07-09 DIAGNOSIS — Z510 Encounter for antineoplastic radiation therapy: Secondary | ICD-10-CM | POA: Diagnosis not present

## 2016-07-10 ENCOUNTER — Ambulatory Visit
Admission: RE | Admit: 2016-07-10 | Discharge: 2016-07-10 | Disposition: A | Discharge status: Home / Self Care | Payer: Medicare HMO | Source: Ambulatory Visit | Attending: Radiation Oncology | Admitting: Radiation Oncology

## 2016-07-10 DIAGNOSIS — Z510 Encounter for antineoplastic radiation therapy: Secondary | ICD-10-CM | POA: Diagnosis not present

## 2016-07-11 ENCOUNTER — Ambulatory Visit
Admission: RE | Admit: 2016-07-11 | Discharge: 2016-07-11 | Disposition: A | Discharge status: Home / Self Care | Payer: Medicare HMO | Source: Ambulatory Visit | Attending: Radiation Oncology | Admitting: Radiation Oncology

## 2016-07-11 DIAGNOSIS — Z510 Encounter for antineoplastic radiation therapy: Secondary | ICD-10-CM | POA: Diagnosis not present

## 2016-07-12 ENCOUNTER — Ambulatory Visit
Admission: RE | Admit: 2016-07-12 | Discharge: 2016-07-12 | Disposition: A | Discharge status: Home / Self Care | Payer: Medicare HMO | Source: Ambulatory Visit | Attending: Radiation Oncology | Admitting: Radiation Oncology

## 2016-07-12 ENCOUNTER — Ambulatory Visit: Payer: Medicare HMO

## 2016-07-12 DIAGNOSIS — Z510 Encounter for antineoplastic radiation therapy: Secondary | ICD-10-CM | POA: Diagnosis not present

## 2016-07-13 ENCOUNTER — Ambulatory Visit: Payer: Medicare HMO

## 2016-07-13 ENCOUNTER — Ambulatory Visit
Admission: RE | Admit: 2016-07-13 | Discharge: 2016-07-13 | Disposition: A | Discharge status: Home / Self Care | Payer: Medicare HMO | Source: Ambulatory Visit | Attending: Radiation Oncology | Admitting: Radiation Oncology

## 2016-07-13 DIAGNOSIS — Z510 Encounter for antineoplastic radiation therapy: Secondary | ICD-10-CM | POA: Diagnosis not present

## 2016-07-16 ENCOUNTER — Ambulatory Visit
Admission: RE | Admit: 2016-07-16 | Discharge: 2016-07-16 | Disposition: A | Discharge status: Home / Self Care | Payer: Medicare HMO | Source: Ambulatory Visit | Attending: Radiation Oncology | Admitting: Radiation Oncology

## 2016-07-16 DIAGNOSIS — Z510 Encounter for antineoplastic radiation therapy: Secondary | ICD-10-CM | POA: Diagnosis not present

## 2016-08-13 ENCOUNTER — Ambulatory Visit
Admission: RE | Admit: 2016-08-13 | Discharge: 2016-08-13 | Disposition: A | Discharge status: Home / Self Care | Payer: Medicare HMO | Source: Ambulatory Visit | Attending: Radiation Oncology | Admitting: Radiation Oncology

## 2016-08-13 ENCOUNTER — Other Ambulatory Visit: Payer: Self-pay | Admitting: *Deleted

## 2016-08-13 ENCOUNTER — Encounter: Payer: Self-pay | Admitting: Radiation Oncology

## 2016-08-13 VITALS — BP 159/101 | HR 76 | Temp 96.3°F | Resp 20 | Wt 230.8 lb

## 2016-08-13 DIAGNOSIS — C61 Malignant neoplasm of prostate: Secondary | ICD-10-CM | POA: Diagnosis present

## 2016-08-13 DIAGNOSIS — Z923 Personal history of irradiation: Secondary | ICD-10-CM | POA: Insufficient documentation

## 2016-08-13 NOTE — Progress Notes (Signed)
Radiation Oncology Follow up Note  Name: Daniel Knapp   Date:   08/13/2016 MRN:  [id number] DOB: Jul 01, 1950    This 66 y.o. male presents to the clinic today for one-month follow-up status post image guided I MRT radiation therapy for adenocarcinoma the prostate.  REFERRING PROVIDER: No ref. 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 He specifically denies diarrhea dysuria or any other lower urinary tract symptoms.  COMPLICATIONS OF TREATMENT: none  FOLLOW UP COMPLIANCE: keeps appointments   PHYSICAL EXAM:  BP (!) 159/101   Pulse 76   Temp (!) 96.3 F (35.7 C)   Resp 20   Wt 230 lb 13.2 oz (104.7 kg)   BMI 30.45 kg/m  On rectal exam rectal sphincter tone is good. Prostate is smooth contracted without evidence of nodularity or mass. Sulcus is preserved bilaterally. No discrete nodularity is identified. No other rectal abnormalities are noted. Well-developed well-nourished patient in NAD. HEENT reveals PERLA, EOMI, discs not visualized.  Oral cavity is clear. No oral mucosal lesions are identified. Neck is clear without evidence of cervical or supraclavicular adenopathy. Lungs are clear to A&P. Cardiac examination is essentially unremarkable with regular rate and rhythm without murmur rub or thrill. Abdomen is benign with no organomegaly or masses noted. Motor sensory and DTR levels are equal and symmetric in the upper and lower extremities. Cranial nerves II through XII are grossly intact. Proprioception is intact. No peripheral adenopathy or edema is identified. No motor or sensory levels are noted. Crude visual fields are within normal range.  RADIOLOGY RESULTS: No current films for review  PLAN: Present time patient is recovering nicely from his radiation therapy treatments. I'm please was  overall progress. I've explained to him our PSA protocol would like to see him back in 3-4 months obtain a PSA at that time which has been ordered. Patient continues to do well he knows to call with any concerns.  I would like to take this opportunity to thank you for allowing me to participate in the care of your patient.Armstead Peaks., MD

## 2016-10-19 ENCOUNTER — Encounter: Payer: Self-pay | Admitting: *Deleted

## 2016-10-24 ENCOUNTER — Ambulatory Visit (INDEPENDENT_AMBULATORY_CARE_PROVIDER_SITE_OTHER): Payer: Medicare HMO | Admitting: Urology

## 2016-10-24 ENCOUNTER — Encounter: Payer: Self-pay | Admitting: Urology

## 2016-10-24 VITALS — BP 155/71 | HR 82 | Ht 73.0 in | Wt 227.4 lb

## 2016-10-24 DIAGNOSIS — C61 Malignant neoplasm of prostate: Secondary | ICD-10-CM

## 2016-10-24 NOTE — Progress Notes (Signed)
10/24/2016 2:01 PM   Daniel Knapp 1950-07-21 [id number]  Referring provider: No referring provider defined for this encounter.  Chief Complaint  Patient presents with  . Prostate Cancer    HPI: The patient is a 66 year old gentleman who presents today for follow-up of his prostate cancer.  1. Prostate cancer Incidentally diagnosed prostate cancer on TURP pathology, PSA 3.3 prior. More recent prostate biopsy on 01/23/2016 with 4+3, high-volume at the left lateral mid and base involving a total of 5/12 biopsy specimens.  He elected to undergo external beam radiation from December 2017 through February 2018.  He has no complaints since undergoing radiation. In terms of his urinary symptoms, they're well controlled. He denies nocturia. He voiced a good stream. He feels he empties his bladder without straining. He has no urgency.  He denies any bowel problems. He has normal formed bowel movements with no diarrhea or leakage of stool.  He does have minor erectile dysfunction but he is able to obtain and maintain an erection without medication. This has not changed since his radiation.   PMH: Past Medical History:  Diagnosis Date  . Acute cystitis with hematuria   . HTN (hypertension)   . Prostate cancer ([place]) 02/2016  . Prostate enlargement   . Urinary catheter insertion/adjustment/removal   . Urinary retention   . UTI (lower urinary tract infection)   . UTI symptoms   . Weight loss     Surgical History: Past Surgical History:  Procedure Laterality Date  . NO PAST SURGERIES    . TRANSURETHRAL RESECTION OF BLADDER TUMOR N/A 08/17/2015   Procedure: TRANSURETHRAL RESECTION OF BLADDER TUMOR (TURBT);  Surgeon: [name], MD;  Location: [facility] ORS;  Service: Urology;  Laterality: N/A;  . TRANSURETHRAL RESECTION OF PROSTATE N/A 08/17/2015   Procedure: TRANSURETHRAL RESECTION OF THE PROSTATE (TURP);  Surgeon: [name], MD;  Location: [facility] ORS;  Service: Urology;   Laterality: N/A;    Home Medications:  Allergies as of 10/24/2016   No Known Allergies     Medication List       Accurate as of 10/24/16  2:01 PM. Always use your most recent med list.          amLODipine 10 MG tablet Commonly known as:  NORVASC Take 10 mg by mouth daily.   finasteride 5 MG tablet Commonly known as:  PROSCAR Take 5 mg by mouth daily. Reported on 08/17/2015   HARVONI 90-400 MG Tabs Generic drug:  Ledipasvir-Sofosbuvir   metoprolol tartrate 25 MG tablet Commonly known as:  LOPRESSOR Take 25 mg by mouth 2 (two) times daily. Reported on 08/17/2015   tamsulosin 0.4 MG Caps capsule Commonly known as:  FLOMAX Take 0.4 mg by mouth daily. Reported on 09/01/2015       Allergies: No Known Allergies  Family History: Family History  Problem Relation Age of Onset  . Cancer Father   . Heart disease Mother   . Kidney disease Neg Hx   . Prostate cancer Neg Hx     Social History:  reports that he has never smoked. He has never used smokeless tobacco. He reports that he drinks alcohol. He reports that he does not use drugs.  ROS: UROLOGY Frequent Urination?: No Hard to postpone urination?: No Burning/pain with urination?: No Get up at night to urinate?: No Leakage of urine?: No Urine stream starts and stops?: No Trouble starting stream?: No Do you have to strain to urinate?: No Blood in urine?: No Urinary tract  infection?: No Sexually transmitted disease?: No Injury to kidneys or bladder?: No Painful intercourse?: No Weak stream?: No Erection problems?: No Penile pain?: No  Gastrointestinal Nausea?: No Vomiting?: No Indigestion/heartburn?: No Diarrhea?: No Constipation?: No  Constitutional Fever: No Night sweats?: No Weight loss?: No Fatigue?: No  Skin Skin rash/lesions?: No Itching?: No  Eyes Blurred vision?: No Double vision?: No  Ears/Nose/Throat Sore throat?: No Sinus problems?: No  Hematologic/Lymphatic Swollen glands?:  No Easy bruising?: No  Cardiovascular Leg swelling?: No Chest pain?: No  Respiratory Cough?: No Shortness of breath?: No  Endocrine Excessive thirst?: No  Musculoskeletal Back pain?: No Joint pain?: No  Neurological Headaches?: No Dizziness?: No  Psychologic Depression?: No Anxiety?: No  Physical Exam: BP (!) 155/71 (BP Location: Left Arm, Patient Position: Sitting, Cuff Size: Normal)   Pulse 82   Ht 6\' 1"  (1.854 m)   Wt 227 lb 6.4 oz (103.1 kg)   BMI 30.00 kg/m   Constitutional:  Alert and oriented, No acute distress. HEENT: [place] AT, moist mucus membranes.  Trachea midline, no masses. Cardiovascular: No clubbing, cyanosis, or edema. Respiratory: Normal respiratory effort, no increased work of breathing. GI: Abdomen is soft, nontender, nondistended, no abdominal masses GU: No CVA tenderness.  Skin: No rashes, bruises or suspicious lesions. Lymph: No cervical or inguinal adenopathy. Neurologic: Grossly intact, no focal deficits, moving all 4 extremities. Psychiatric: Normal mood and affect.  Laboratory Data: Lab Results  Component Value Date   WBC 3.4 (L) 07/03/2016   HGB 15.3 07/03/2016   HCT 43.3 07/03/2016   MCV 91.0 07/03/2016   PLT 116 (L) 07/03/2016    Lab Results  Component Value Date   CREATININE 1.00 02/21/2016    No results found for: PSA  No results found for: TESTOSTERONE  No results found for: HGBA1C  Urinalysis    Component Value Date/Time   COLORURINE Amber 09/17/2014 2237   APPEARANCEUR Cloudy (A) 09/01/2015 1015   LABSPEC 1.014 09/17/2014 2237   PHURINE 6.0 09/17/2014 2237   GLUCOSEU Negative 09/01/2015 1015   GLUCOSEU Negative 09/17/2014 2237   HGBUR 3+ 09/17/2014 2237   BILIRUBINUR Negative 09/01/2015 1015   BILIRUBINUR Negative 09/17/2014 2237   KETONESUR Negative 09/17/2014 2237   PROTEINUR 3+ (A) 09/01/2015 1015   PROTEINUR 100 mg/dL 09/17/2014 2237   NITRITE Positive (A) 09/01/2015 1015   NITRITE Negative 09/17/2014  2237   LEUKOCYTESUR 1+ (A) 09/01/2015 1015   LEUKOCYTESUR Negative 09/17/2014 2237     Assessment & Plan:    1. Prostate cancer  We will check the patient's PSA today to see how it has changed since his radiation. Since he is already following up with radiation oncology in 2-3 months, I will see him back in approximately 6 months with PSA prior.  Return in about 6 months (around 04/26/2017) for PSA prior.  [name], MD  [facility] [address] [place],  [address] [phone]

## 2016-10-25 ENCOUNTER — Telehealth: Payer: Self-pay

## 2016-10-25 LAB — PSA: PROSTATE SPECIFIC AG, SERUM: 1.2 ng/mL (ref 0.0–4.0)

## 2016-10-25 NOTE — Telephone Encounter (Signed)
Daniel Retort, MD  Toniann Fail C, LPN        Please let patient know his PSA dropped to 1.2 from 3.3 after his radiation a few months ago. This is a good start, and we will continue to monitor it. Expect it to continue to decrease over the next year.    Spoke with pt in reference to PSA results. Pt voiced understanding.

## 2016-12-19 ENCOUNTER — Inpatient Hospital Stay: Payer: Medicare HMO | Attending: Radiation Oncology

## 2016-12-26 ENCOUNTER — Ambulatory Visit: Payer: Medicare HMO | Admitting: Radiation Oncology

## 2016-12-31 ENCOUNTER — Encounter: Payer: Self-pay | Admitting: Radiation Oncology

## 2016-12-31 ENCOUNTER — Ambulatory Visit
Admission: RE | Admit: 2016-12-31 | Discharge: 2016-12-31 | Disposition: A | Discharge status: Home / Self Care | Payer: Medicare HMO | Source: Ambulatory Visit | Attending: Radiation Oncology | Admitting: Radiation Oncology

## 2016-12-31 VITALS — BP 186/95 | HR 74 | Temp 98.0°F | Wt 227.1 lb

## 2016-12-31 DIAGNOSIS — Z923 Personal history of irradiation: Secondary | ICD-10-CM | POA: Diagnosis not present

## 2016-12-31 DIAGNOSIS — C61 Malignant neoplasm of prostate: Secondary | ICD-10-CM

## 2016-12-31 NOTE — Progress Notes (Signed)
Radiation Oncology Follow up Note  Name: Daniel Knapp   Date:   12/31/2016 MRN:  [id number] DOB: 23-Dec-1950    This 66 y.o. male presents to the clinic today for 5 month follow-up status post I MRT radiation therapy for Gleason 7 adenocarcinoma the prostate stage IIa.  REFERRING PROVIDER: No ref. provider found  HPI: patient is a 66 year old male now out 5 months having completed IM RT radiation therapy for Gleason 7 (4+3) adenocarcinoma the prostate. He is seen today in routine follow-up is doing well specifically denies diarrhea dysuria or any other GI/GU complaints. Most recent PSA was 1.2 on 10/24/2016.Marland Kitchen  COMPLICATIONS OF TREATMENT: none  FOLLOW UP COMPLIANCE: keeps appointments   PHYSICAL EXAM:  BP (!) 186/95   Pulse 74   Temp 98 F (36.7 C)   Wt 227 lb 1.2 oz (103 kg)   BMI 29.96 kg/m  On rectal exam rectal sphincter tone is good. Prostate is smooth contracted without evidence of nodularity or mass. Sulcus is preserved bilaterally. No discrete nodularity is identified. No other rectal abnormalities are noted. Well-developed well-nourished patient in NAD. HEENT reveals PERLA, EOMI, discs not visualized.  Oral cavity is clear. No oral mucosal lesions are identified. Neck is clear without evidence of cervical or supraclavicular adenopathy. Lungs are clear to A&P. Cardiac examination is essentially unremarkable with regular rate and rhythm without murmur rub or thrill. Abdomen is benign with no organomegaly or masses noted. Motor sensory and DTR levels are equal and symmetric in the upper and lower extremities. Cranial nerves II through XII are grossly intact. Proprioception is intact. No peripheral adenopathy or edema is identified. No motor or sensory levels are noted. Crude visual fields are within normal range.  RADIOLOGY RESULTS: no current films for review  PLAN: present time patient is doing well with no evidence of disease under good biochemical control of his prostate  cancer. I am please was overall progress. I've asked to see him back in 6 months for follow-up and will obtain a PSA prior to that visit. Patient knows to call sooner with any concerns. He continues to do extremely well.  I would like to take this opportunity to thank you for allowing me to participate in the care of your patient.Armstead Peaks., MD

## 2017-01-30 ENCOUNTER — Other Ambulatory Visit: Payer: Self-pay | Admitting: Family Medicine

## 2017-01-30 DIAGNOSIS — B182 Chronic viral hepatitis C: Secondary | ICD-10-CM

## 2017-04-22 ENCOUNTER — Other Ambulatory Visit: Payer: Medicare HMO

## 2017-04-22 DIAGNOSIS — C61 Malignant neoplasm of prostate: Secondary | ICD-10-CM

## 2017-04-23 LAB — PSA: Prostate Specific Ag, Serum: 0.6 ng/mL (ref 0.0–4.0)

## 2017-04-26 ENCOUNTER — Ambulatory Visit (INDEPENDENT_AMBULATORY_CARE_PROVIDER_SITE_OTHER): Payer: Medicare HMO | Admitting: Urology

## 2017-04-26 VITALS — BP 192/115 | HR 106 | Ht 73.0 in | Wt 220.2 lb

## 2017-04-26 DIAGNOSIS — C61 Malignant neoplasm of prostate: Secondary | ICD-10-CM | POA: Diagnosis not present

## 2017-04-26 MED ORDER — SILDENAFIL CITRATE 20 MG PO TABS: ORAL_TABLET | ORAL | 3 refills | Status: DC

## 2017-04-26 NOTE — Progress Notes (Signed)
04/26/2017 1:54 PM   Daniel Knapp 1950-12-19 [id number]  Referring provider: No referring provider defined for this encounter.  Chief Complaint  Patient presents with  . Prostate Cancer    HPI: The patient is a 66 year old gentleman who presents today for follow-up of his prostate cancer.  1. Prostate cancer Incidentally diagnosed prostate cancer on TURP pathology, PSA 3.3prior. More recent prostate biopsy on 01/23/2016 with 4+3, high-volume at the left lateral mid and base involving a total of 5/12 biopsy specimens.  He elected to undergo external beam radiation from December 2017 through February 2018. PSA in November 2018 was down to 0.6.  He has no complaints since undergoing radiation. In terms of his urinary symptoms, they're well controlled. He denies nocturia. He voiced a good stream. He feels he empties his bladder without straining. He has no urgency.  He does have minor erectile dysfunction.  He is able to obtain an erection but is not as hard as it previously was.  He often is unable to maintain it to the completion of intercourse.  This has gotten worse since radiation.  He is now interested in medications.  He takes no nitrates and has no cardiac issues.   PMH: Past Medical History:  Diagnosis Date  . Acute cystitis with hematuria   . HTN (hypertension)   . Prostate cancer ([place]) 02/2016  . Prostate enlargement   . Urinary catheter insertion/adjustment/removal   . Urinary retention   . UTI (lower urinary tract infection)   . UTI symptoms   . Weight loss     Surgical History: Past Surgical History:  Procedure Laterality Date  . NO PAST SURGERIES    . TRANSURETHRAL RESECTION OF BLADDER TUMOR (TURBT) N/A 08/17/2015   Performed by [name], MD at [facility] ORS  . TRANSURETHRAL RESECTION OF THE PROSTATE (TURP) N/A 08/17/2015   Performed by [name], MD at [facility] ORS    Home Medications:  Allergies as of 04/26/2017   No Known Allergies     Medication List        Accurate as of 04/26/17  1:54 PM. Always use your most recent med list.          amLODipine 10 MG tablet Commonly known as:  NORVASC Take 10 mg by mouth daily.   finasteride 5 MG tablet Commonly known as:  PROSCAR Take 5 mg by mouth daily. Reported on 08/17/2015   HARVONI 90-400 MG Tabs Generic drug:  Ledipasvir-Sofosbuvir   metoprolol tartrate 25 MG tablet Commonly known as:  LOPRESSOR Take 25 mg by mouth 2 (two) times daily. Reported on 08/17/2015   tamsulosin 0.4 MG Caps capsule Commonly known as:  FLOMAX Take 0.4 mg by mouth daily. Reported on 09/01/2015       Allergies: No Known Allergies  Family History: Family History  Problem Relation Age of Onset  . Heart disease Mother   . Cancer Father   . Kidney disease Neg Hx   . Prostate cancer Neg Hx     Social History:  reports that  has never smoked. he has never used smokeless tobacco. He reports that he drinks alcohol. He reports that he does not use drugs.  ROS: UROLOGY Frequent Urination?: No Hard to postpone urination?: No Burning/pain with urination?: No Get up at night to urinate?: No Leakage of urine?: No Urine stream starts and stops?: No Trouble starting stream?: No Do you have to strain to urinate?: No Blood in urine?: No Urinary tract infection?: No  Sexually transmitted disease?: No Injury to kidneys or bladder?: No Painful intercourse?: No Weak stream?: No Erection problems?: No Penile pain?: No  Gastrointestinal Nausea?: No Vomiting?: No Indigestion/heartburn?: No Diarrhea?: No Constipation?: No  Constitutional Fever: No Night sweats?: No Weight loss?: No Fatigue?: No  Skin Skin rash/lesions?: No Itching?: No  Eyes Blurred vision?: No Double vision?: No  Ears/Nose/Throat Sore throat?: No Sinus problems?: No  Hematologic/Lymphatic Swollen glands?: No Easy bruising?: No  Cardiovascular Leg swelling?: No Chest pain?:  No  Respiratory Cough?: No Shortness of breath?: No  Endocrine Excessive thirst?: No  Musculoskeletal Back pain?: No Joint pain?: No  Neurological Headaches?: No Dizziness?: No  Psychologic Depression?: No Anxiety?: No  Physical Exam: BP (!) 192/115 (BP Location: Right Arm, Patient Position: Sitting, Cuff Size: Large)   Pulse (!) 106   Ht 6\' 1"  (1.854 m)   Wt 220 lb 3.2 oz (99.9 kg)   BMI 29.05 kg/m   Constitutional:  Alert and oriented, No acute distress. HEENT: [place] AT, moist mucus membranes.  Trachea midline, no masses. Cardiovascular: No clubbing, cyanosis, or edema. Respiratory: Normal respiratory effort, no increased work of breathing. GI: Abdomen is soft, nontender, nondistended, no abdominal masses GU: No CVA tenderness.  Skin: No rashes, bruises or suspicious lesions. Lymph: No cervical or inguinal adenopathy. Neurologic: Grossly intact, no focal deficits, moving all 4 extremities. Psychiatric: Normal mood and affect.  Laboratory Data: Lab Results  Component Value Date   WBC 3.4 (L) 07/03/2016   HGB 15.3 07/03/2016   HCT 43.3 07/03/2016   MCV 91.0 07/03/2016   PLT 116 (L) 07/03/2016    Lab Results  Component Value Date   CREATININE 1.00 02/21/2016    No results found for: PSA  No results found for: TESTOSTERONE  No results found for: HGBA1C  Urinalysis    Component Value Date/Time   COLORURINE Amber 09/17/2014 2237   APPEARANCEUR Cloudy (A) 09/01/2015 1015   LABSPEC 1.014 09/17/2014 2237   PHURINE 6.0 09/17/2014 2237   GLUCOSEU Negative 09/01/2015 1015   GLUCOSEU Negative 09/17/2014 2237   HGBUR 3+ 09/17/2014 2237   BILIRUBINUR Negative 09/01/2015 1015   BILIRUBINUR Negative 09/17/2014 2237   KETONESUR Negative 09/17/2014 2237   PROTEINUR 3+ (A) 09/01/2015 1015   PROTEINUR 100 mg/dL 09/17/2014 2237   NITRITE Positive (A) 09/01/2015 1015   NITRITE Negative 09/17/2014 2237   LEUKOCYTESUR 1+ (A) 09/01/2015 1015   LEUKOCYTESUR Negative  09/17/2014 2237     Assessment & Plan:    1.  Prostate cancer PSA continues to respond appropriately after undergoing radiation.  Patient will follow-up in 6 months with a PSA prior.  2.  ED We will start the patient on sildenafil.  He was warned of the risk of priapism and need for emergent intervention.  Return in about 6 months (around 10/24/2017) for PSA prior.  [name], MD  [facility] [address], [place], [place] [address] [phone]

## 2017-05-29 ENCOUNTER — Other Ambulatory Visit: Payer: Self-pay | Admitting: Family Medicine

## 2017-05-29 ENCOUNTER — Other Ambulatory Visit: Payer: Self-pay | Admitting: *Deleted

## 2017-05-29 DIAGNOSIS — B182 Chronic viral hepatitis C: Secondary | ICD-10-CM

## 2017-06-05 ENCOUNTER — Ambulatory Visit: Payer: Medicare HMO

## 2017-07-04 ENCOUNTER — Inpatient Hospital Stay: Payer: Medicare HMO | Attending: Radiation Oncology

## 2017-07-04 DIAGNOSIS — Z8546 Personal history of malignant neoplasm of prostate: Secondary | ICD-10-CM | POA: Diagnosis present

## 2017-07-04 DIAGNOSIS — Z923 Personal history of irradiation: Secondary | ICD-10-CM | POA: Insufficient documentation

## 2017-07-04 DIAGNOSIS — C61 Malignant neoplasm of prostate: Secondary | ICD-10-CM

## 2017-07-04 LAB — PSA: Prostatic Specific Antigen: 0.59 ng/mL (ref 0.00–4.00)

## 2017-07-11 ENCOUNTER — Ambulatory Visit
Admission: RE | Admit: 2017-07-11 | Discharge: 2017-07-11 | Disposition: A | Discharge status: Home / Self Care | Payer: Medicare HMO | Source: Ambulatory Visit | Attending: Radiation Oncology | Admitting: Radiation Oncology

## 2017-07-11 ENCOUNTER — Encounter: Payer: Self-pay | Admitting: Radiation Oncology

## 2017-07-11 ENCOUNTER — Other Ambulatory Visit: Payer: Self-pay

## 2017-07-11 VITALS — BP 172/94 | HR 74 | Temp 97.1°F | Resp 20 | Wt 221.2 lb

## 2017-07-11 DIAGNOSIS — Z923 Personal history of irradiation: Secondary | ICD-10-CM | POA: Insufficient documentation

## 2017-07-11 DIAGNOSIS — C61 Malignant neoplasm of prostate: Secondary | ICD-10-CM | POA: Diagnosis not present

## 2017-07-11 NOTE — Progress Notes (Signed)
Radiation Oncology Follow up Note  Name: Daniel Knapp   Date:   07/11/2017 MRN:  [id number] DOB: 09/05/1950    This 67 y.o. male presents to the clinic today for 79-month follow-up status post IM RT radiation therapy for Gleason 7 adenocarcinoma the prostate.  REFERRING PROVIDER: No ref. provider found  HPI: Patient is a 67 year old male now at 11 months having IM RT radiation therapy to his prostate for Gleason 7 (4+3).  He is seen today in routine follow-up and is doing well continues to have some soft loose stools does occasionally take Imodium.  Does not follow any low residue diet.  He specifically denies any increased lower urinary tract symptoms.  His PSA continues to decline it was 1.27 months ago is now 0.59.Marland Kitchen  COMPLICATIONS OF TREATMENT: none  FOLLOW UP COMPLIANCE: keeps appointments   PHYSICAL EXAM:  BP (!) 172/94   Pulse 74   Temp (!) 97.1 F (36.2 C)   Resp 20   Wt 221 lb 3.7 oz (100.4 kg)   BMI 29.19 kg/m  Well-developed well-nourished patient in NAD. HEENT reveals PERLA, EOMI, discs not visualized.  Oral cavity is clear. No oral mucosal lesions are identified. Neck is clear without evidence of cervical or supraclavicular adenopathy. Lungs are clear to A&P. Cardiac examination is essentially unremarkable with regular rate and rhythm without murmur rub or thrill. Abdomen is benign with no organomegaly or masses noted. Motor sensory and DTR levels are equal and symmetric in the upper and lower extremities. Cranial nerves II through XII are grossly intact. Proprioception is intact. No peripheral adenopathy or edema is identified. No motor or sensory levels are noted. Crude visual fields are within normal range.  RADIOLOGY RESULTS: No current films for review  PLAN: Present time he continues to do well with declining PSA.  I am given him a low residue diet to follow.  I have assured him his bowels will improve over time.  I have asked to see him back in 6 months for  follow-up and then will start once a year follow-up visits.  Patient knows to call sooner with any concerns.  I would like to take this opportunity to thank you for allowing me to participate in the care of your patient.Noreene Filbert, MD

## 2018-01-01 ENCOUNTER — Other Ambulatory Visit: Payer: Self-pay | Admitting: Family Medicine

## 2018-01-01 DIAGNOSIS — B182 Chronic viral hepatitis C: Secondary | ICD-10-CM

## 2018-01-06 ENCOUNTER — Ambulatory Visit
Admission: RE | Admit: 2018-01-06 | Discharge: 2018-01-06 | Disposition: A | Discharge status: Home / Self Care | Payer: Medicare HMO | Source: Ambulatory Visit | Attending: Family Medicine | Admitting: Family Medicine

## 2018-01-06 ENCOUNTER — Other Ambulatory Visit: Payer: Self-pay | Admitting: Family Medicine

## 2018-01-06 DIAGNOSIS — K7460 Unspecified cirrhosis of liver: Secondary | ICD-10-CM | POA: Insufficient documentation

## 2018-01-06 DIAGNOSIS — B182 Chronic viral hepatitis C: Secondary | ICD-10-CM | POA: Diagnosis present

## 2018-01-06 DIAGNOSIS — K8020 Calculus of gallbladder without cholecystitis without obstruction: Secondary | ICD-10-CM | POA: Diagnosis not present

## 2018-01-16 ENCOUNTER — Inpatient Hospital Stay: Payer: Medicare HMO | Attending: Radiation Oncology

## 2018-01-23 ENCOUNTER — Ambulatory Visit: Admission: RE | Admit: 2018-01-23 | Payer: Medicare HMO | Source: Ambulatory Visit | Admitting: Radiation Oncology

## 2018-02-05 IMAGING — NM NM BONE WHOLE BODY
2 series · 6 of 6 positions shown · non-contrast
Comparison: Abdominal CT 02/21/2016

CLINICAL DATA: Prostate cancer. Complains of left hip and pelvic
pain for a few weeks.

EXAM:
NUCLEAR MEDICINE WHOLE BODY BONE SCAN
TECHNIQUE: Whole body anterior and posterior images were obtained approximately
3 hours after intravenous injection of radiopharmaceutical.
RADIOPHARMACEUTICALS:  23.4 mCi 8echnetium-77m MDP IV

[Series 1000: statics · 2.40mm/px · 2 acquisitions, 4 frames shown]
[im 1/2]
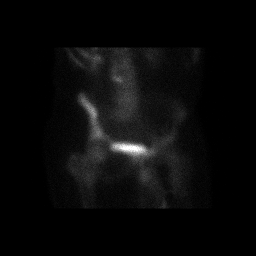
[im 1/2]
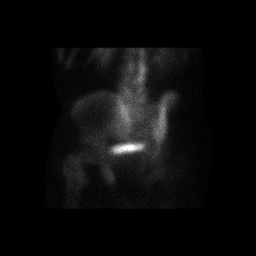
[im 2/2]
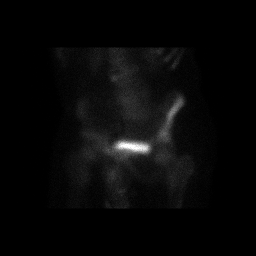
[im 2/2]
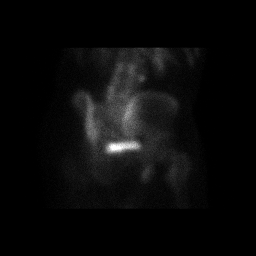

[Series 1000: 3 hr wholebody · 2.40mm/px · 2 of 2 frames shown]
[frame 1/2]
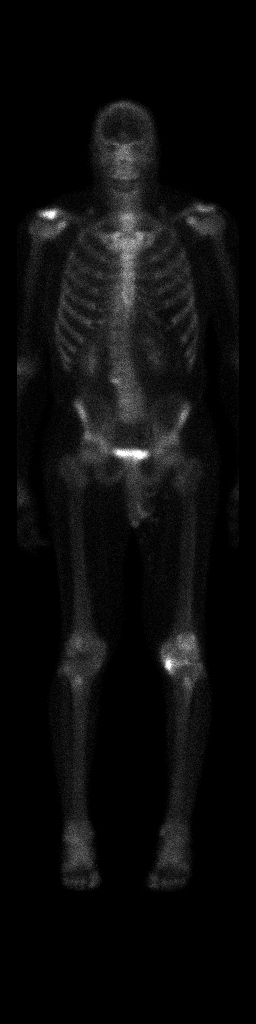
[frame 2/2]
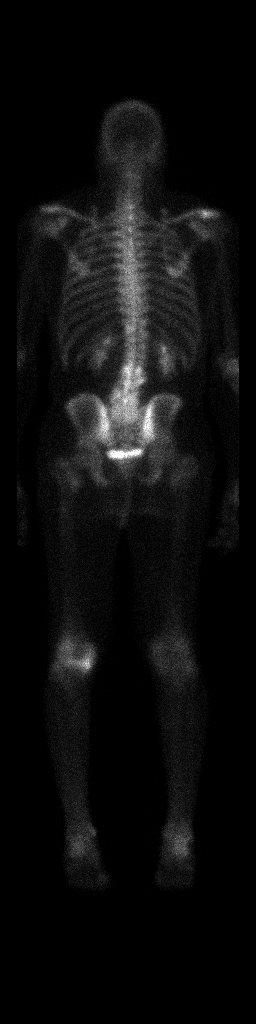

[6 of 6 positions shown; findings below may reference images not displayed]

FINDINGS: Dextroscoliosis in the lumbar spine with degenerative changes.
Increased uptake in both shoulders are suggestive for degenerative
disease. Increased uptake in both knees, left side greater than
right, suggestive for degenerative changes. Degenerative changes in
both hips. Expected uptake in the renal collecting system and
urinary bladder. No suspicious uptake in the axial skeleton or ribs.
IMPRESSION: No evidence for metastatic bone disease.

Scattered degenerative disease as described. Left hip and pelvic
pain may be associated with degenerative changes in the left hip and
left knee.

## 2018-03-03 ENCOUNTER — Other Ambulatory Visit: Payer: Self-pay

## 2018-03-03 MED ORDER — SILDENAFIL CITRATE 20 MG PO TABS: ORAL_TABLET | ORAL | 0 refills | Status: DC

## 2018-03-11 ENCOUNTER — Other Ambulatory Visit: Payer: Self-pay | Admitting: Family Medicine

## 2018-03-11 DIAGNOSIS — R413 Other amnesia: Secondary | ICD-10-CM

## 2018-03-27 ENCOUNTER — Ambulatory Visit: Payer: Medicare HMO

## 2018-04-15 ENCOUNTER — Ambulatory Visit: Payer: Medicare HMO

## 2018-04-16 ENCOUNTER — Ambulatory Visit
Admission: RE | Admit: 2018-04-16 | Discharge: 2018-04-16 | Disposition: A | Discharge status: Home / Self Care | Payer: Medicare HMO | Source: Ambulatory Visit | Attending: Family Medicine | Admitting: Family Medicine

## 2018-04-16 DIAGNOSIS — R413 Other amnesia: Secondary | ICD-10-CM | POA: Diagnosis present

## 2018-04-29 ENCOUNTER — Ambulatory Visit: Payer: Medicare HMO

## 2019-03-10 DIAGNOSIS — Q675 Congenital deformity of spine: Secondary | ICD-10-CM | POA: Diagnosis not present

## 2019-03-10 DIAGNOSIS — N400 Enlarged prostate without lower urinary tract symptoms: Secondary | ICD-10-CM | POA: Diagnosis not present

## 2019-03-10 DIAGNOSIS — Z8546 Personal history of malignant neoplasm of prostate: Secondary | ICD-10-CM | POA: Diagnosis not present

## 2019-03-10 DIAGNOSIS — I10 Essential (primary) hypertension: Secondary | ICD-10-CM | POA: Diagnosis not present

## 2019-03-10 DIAGNOSIS — G309 Alzheimer's disease, unspecified: Secondary | ICD-10-CM | POA: Diagnosis not present

## 2019-03-10 DIAGNOSIS — B182 Chronic viral hepatitis C: Secondary | ICD-10-CM | POA: Diagnosis not present

## 2019-03-10 DIAGNOSIS — E538 Deficiency of other specified B group vitamins: Secondary | ICD-10-CM | POA: Diagnosis not present

## 2019-03-10 DIAGNOSIS — F0280 Dementia in other diseases classified elsewhere without behavioral disturbance: Secondary | ICD-10-CM | POA: Diagnosis not present

## 2019-03-10 DIAGNOSIS — F0150 Vascular dementia without behavioral disturbance: Secondary | ICD-10-CM | POA: Diagnosis not present

## 2019-03-12 DIAGNOSIS — N400 Enlarged prostate without lower urinary tract symptoms: Secondary | ICD-10-CM | POA: Diagnosis not present

## 2019-03-12 DIAGNOSIS — F0280 Dementia in other diseases classified elsewhere without behavioral disturbance: Secondary | ICD-10-CM | POA: Diagnosis not present

## 2019-03-12 DIAGNOSIS — I10 Essential (primary) hypertension: Secondary | ICD-10-CM | POA: Diagnosis not present

## 2019-03-12 DIAGNOSIS — B182 Chronic viral hepatitis C: Secondary | ICD-10-CM | POA: Diagnosis not present

## 2019-03-12 DIAGNOSIS — Z8546 Personal history of malignant neoplasm of prostate: Secondary | ICD-10-CM | POA: Diagnosis not present

## 2019-03-12 DIAGNOSIS — E538 Deficiency of other specified B group vitamins: Secondary | ICD-10-CM | POA: Diagnosis not present

## 2019-03-12 DIAGNOSIS — Q675 Congenital deformity of spine: Secondary | ICD-10-CM | POA: Diagnosis not present

## 2019-03-12 DIAGNOSIS — F0150 Vascular dementia without behavioral disturbance: Secondary | ICD-10-CM | POA: Diagnosis not present

## 2019-03-12 DIAGNOSIS — G309 Alzheimer's disease, unspecified: Secondary | ICD-10-CM | POA: Diagnosis not present

## 2019-03-17 DIAGNOSIS — G309 Alzheimer's disease, unspecified: Secondary | ICD-10-CM | POA: Diagnosis not present

## 2019-03-17 DIAGNOSIS — B182 Chronic viral hepatitis C: Secondary | ICD-10-CM | POA: Diagnosis not present

## 2019-03-17 DIAGNOSIS — E538 Deficiency of other specified B group vitamins: Secondary | ICD-10-CM | POA: Diagnosis not present

## 2019-03-17 DIAGNOSIS — F0150 Vascular dementia without behavioral disturbance: Secondary | ICD-10-CM | POA: Diagnosis not present

## 2019-03-17 DIAGNOSIS — I10 Essential (primary) hypertension: Secondary | ICD-10-CM | POA: Diagnosis not present

## 2019-03-17 DIAGNOSIS — Z8546 Personal history of malignant neoplasm of prostate: Secondary | ICD-10-CM | POA: Diagnosis not present

## 2019-03-17 DIAGNOSIS — N400 Enlarged prostate without lower urinary tract symptoms: Secondary | ICD-10-CM | POA: Diagnosis not present

## 2019-03-17 DIAGNOSIS — F0280 Dementia in other diseases classified elsewhere without behavioral disturbance: Secondary | ICD-10-CM | POA: Diagnosis not present

## 2019-03-17 DIAGNOSIS — Q675 Congenital deformity of spine: Secondary | ICD-10-CM | POA: Diagnosis not present

## 2019-03-24 DIAGNOSIS — Z8546 Personal history of malignant neoplasm of prostate: Secondary | ICD-10-CM | POA: Diagnosis not present

## 2019-03-24 DIAGNOSIS — F0280 Dementia in other diseases classified elsewhere without behavioral disturbance: Secondary | ICD-10-CM | POA: Diagnosis not present

## 2019-03-24 DIAGNOSIS — N400 Enlarged prostate without lower urinary tract symptoms: Secondary | ICD-10-CM | POA: Diagnosis not present

## 2019-03-24 DIAGNOSIS — I10 Essential (primary) hypertension: Secondary | ICD-10-CM | POA: Diagnosis not present

## 2019-03-24 DIAGNOSIS — F0150 Vascular dementia without behavioral disturbance: Secondary | ICD-10-CM | POA: Diagnosis not present

## 2019-03-24 DIAGNOSIS — G309 Alzheimer's disease, unspecified: Secondary | ICD-10-CM | POA: Diagnosis not present

## 2019-03-24 DIAGNOSIS — E538 Deficiency of other specified B group vitamins: Secondary | ICD-10-CM | POA: Diagnosis not present

## 2019-03-24 DIAGNOSIS — Q675 Congenital deformity of spine: Secondary | ICD-10-CM | POA: Diagnosis not present

## 2019-03-24 DIAGNOSIS — B182 Chronic viral hepatitis C: Secondary | ICD-10-CM | POA: Diagnosis not present

## 2019-03-31 DIAGNOSIS — Z8546 Personal history of malignant neoplasm of prostate: Secondary | ICD-10-CM | POA: Diagnosis not present

## 2019-03-31 DIAGNOSIS — I10 Essential (primary) hypertension: Secondary | ICD-10-CM | POA: Diagnosis not present

## 2019-03-31 DIAGNOSIS — Q675 Congenital deformity of spine: Secondary | ICD-10-CM | POA: Diagnosis not present

## 2019-03-31 DIAGNOSIS — N400 Enlarged prostate without lower urinary tract symptoms: Secondary | ICD-10-CM | POA: Diagnosis not present

## 2019-03-31 DIAGNOSIS — E538 Deficiency of other specified B group vitamins: Secondary | ICD-10-CM | POA: Diagnosis not present

## 2019-03-31 DIAGNOSIS — F0150 Vascular dementia without behavioral disturbance: Secondary | ICD-10-CM | POA: Diagnosis not present

## 2019-03-31 DIAGNOSIS — B182 Chronic viral hepatitis C: Secondary | ICD-10-CM | POA: Diagnosis not present

## 2019-03-31 DIAGNOSIS — G309 Alzheimer's disease, unspecified: Secondary | ICD-10-CM | POA: Diagnosis not present

## 2019-03-31 DIAGNOSIS — F0280 Dementia in other diseases classified elsewhere without behavioral disturbance: Secondary | ICD-10-CM | POA: Diagnosis not present

## 2019-04-06 DIAGNOSIS — Q675 Congenital deformity of spine: Secondary | ICD-10-CM | POA: Diagnosis not present

## 2019-04-06 DIAGNOSIS — G309 Alzheimer's disease, unspecified: Secondary | ICD-10-CM | POA: Diagnosis not present

## 2019-04-06 DIAGNOSIS — N400 Enlarged prostate without lower urinary tract symptoms: Secondary | ICD-10-CM | POA: Diagnosis not present

## 2019-04-06 DIAGNOSIS — Z8546 Personal history of malignant neoplasm of prostate: Secondary | ICD-10-CM | POA: Diagnosis not present

## 2019-04-06 DIAGNOSIS — I10 Essential (primary) hypertension: Secondary | ICD-10-CM | POA: Diagnosis not present

## 2019-04-06 DIAGNOSIS — F0150 Vascular dementia without behavioral disturbance: Secondary | ICD-10-CM | POA: Diagnosis not present

## 2019-04-06 DIAGNOSIS — B182 Chronic viral hepatitis C: Secondary | ICD-10-CM | POA: Diagnosis not present

## 2019-04-06 DIAGNOSIS — F0280 Dementia in other diseases classified elsewhere without behavioral disturbance: Secondary | ICD-10-CM | POA: Diagnosis not present

## 2019-04-06 DIAGNOSIS — E538 Deficiency of other specified B group vitamins: Secondary | ICD-10-CM | POA: Diagnosis not present

## 2019-04-21 DIAGNOSIS — F0150 Vascular dementia without behavioral disturbance: Secondary | ICD-10-CM | POA: Diagnosis not present

## 2019-04-21 DIAGNOSIS — I10 Essential (primary) hypertension: Secondary | ICD-10-CM | POA: Diagnosis not present

## 2019-04-21 DIAGNOSIS — B182 Chronic viral hepatitis C: Secondary | ICD-10-CM | POA: Diagnosis not present

## 2019-04-21 DIAGNOSIS — E538 Deficiency of other specified B group vitamins: Secondary | ICD-10-CM | POA: Diagnosis not present

## 2019-04-21 DIAGNOSIS — N400 Enlarged prostate without lower urinary tract symptoms: Secondary | ICD-10-CM | POA: Diagnosis not present

## 2019-04-21 DIAGNOSIS — Q675 Congenital deformity of spine: Secondary | ICD-10-CM | POA: Diagnosis not present

## 2019-04-21 DIAGNOSIS — G309 Alzheimer's disease, unspecified: Secondary | ICD-10-CM | POA: Diagnosis not present

## 2019-04-21 DIAGNOSIS — Z8546 Personal history of malignant neoplasm of prostate: Secondary | ICD-10-CM | POA: Diagnosis not present

## 2019-04-21 DIAGNOSIS — F0280 Dementia in other diseases classified elsewhere without behavioral disturbance: Secondary | ICD-10-CM | POA: Diagnosis not present

## 2019-06-08 DIAGNOSIS — I10 Essential (primary) hypertension: Secondary | ICD-10-CM | POA: Diagnosis not present

## 2019-06-08 DIAGNOSIS — Z79899 Other long term (current) drug therapy: Secondary | ICD-10-CM | POA: Diagnosis not present

## 2019-06-08 DIAGNOSIS — R739 Hyperglycemia, unspecified: Secondary | ICD-10-CM | POA: Diagnosis not present

## 2019-06-30 DIAGNOSIS — I10 Essential (primary) hypertension: Secondary | ICD-10-CM | POA: Diagnosis not present

## 2019-06-30 DIAGNOSIS — F0390 Unspecified dementia without behavioral disturbance: Secondary | ICD-10-CM | POA: Diagnosis not present

## 2019-06-30 DIAGNOSIS — Z79899 Other long term (current) drug therapy: Secondary | ICD-10-CM | POA: Diagnosis not present

## 2019-06-30 DIAGNOSIS — N400 Enlarged prostate without lower urinary tract symptoms: Secondary | ICD-10-CM | POA: Diagnosis not present

## 2019-06-30 DIAGNOSIS — R739 Hyperglycemia, unspecified: Secondary | ICD-10-CM | POA: Diagnosis not present

## 2019-08-10 DIAGNOSIS — R55 Syncope and collapse: Secondary | ICD-10-CM | POA: Diagnosis not present

## 2019-08-10 DIAGNOSIS — F0150 Vascular dementia without behavioral disturbance: Secondary | ICD-10-CM | POA: Diagnosis not present

## 2019-08-10 DIAGNOSIS — F0280 Dementia in other diseases classified elsewhere without behavioral disturbance: Secondary | ICD-10-CM | POA: Diagnosis not present

## 2019-08-10 DIAGNOSIS — G309 Alzheimer's disease, unspecified: Secondary | ICD-10-CM | POA: Diagnosis not present

## 2019-08-10 DIAGNOSIS — E538 Deficiency of other specified B group vitamins: Secondary | ICD-10-CM | POA: Diagnosis not present

## 2019-09-10 DIAGNOSIS — F0280 Dementia in other diseases classified elsewhere without behavioral disturbance: Secondary | ICD-10-CM | POA: Diagnosis not present

## 2019-09-10 DIAGNOSIS — F0150 Vascular dementia without behavioral disturbance: Secondary | ICD-10-CM | POA: Diagnosis not present

## 2019-09-10 DIAGNOSIS — R55 Syncope and collapse: Secondary | ICD-10-CM | POA: Diagnosis not present

## 2019-09-10 DIAGNOSIS — G309 Alzheimer's disease, unspecified: Secondary | ICD-10-CM | POA: Diagnosis not present

## 2019-09-17 DIAGNOSIS — R55 Syncope and collapse: Secondary | ICD-10-CM | POA: Diagnosis not present

## 2019-12-16 DIAGNOSIS — Z79899 Other long term (current) drug therapy: Secondary | ICD-10-CM | POA: Diagnosis not present

## 2019-12-16 DIAGNOSIS — R739 Hyperglycemia, unspecified: Secondary | ICD-10-CM | POA: Diagnosis not present

## 2019-12-16 DIAGNOSIS — I10 Essential (primary) hypertension: Secondary | ICD-10-CM | POA: Diagnosis not present

## 2019-12-21 DIAGNOSIS — Z1211 Encounter for screening for malignant neoplasm of colon: Secondary | ICD-10-CM | POA: Diagnosis not present

## 2019-12-21 DIAGNOSIS — Z79899 Other long term (current) drug therapy: Secondary | ICD-10-CM | POA: Diagnosis not present

## 2019-12-21 DIAGNOSIS — Z1231 Encounter for screening mammogram for malignant neoplasm of breast: Secondary | ICD-10-CM | POA: Diagnosis not present

## 2019-12-21 DIAGNOSIS — D649 Anemia, unspecified: Secondary | ICD-10-CM | POA: Diagnosis not present

## 2019-12-21 DIAGNOSIS — F0390 Unspecified dementia without behavioral disturbance: Secondary | ICD-10-CM | POA: Diagnosis not present

## 2019-12-21 DIAGNOSIS — Z0000 Encounter for general adult medical examination without abnormal findings: Secondary | ICD-10-CM | POA: Diagnosis not present

## 2020-01-04 DIAGNOSIS — R55 Syncope and collapse: Secondary | ICD-10-CM | POA: Diagnosis not present

## 2020-01-04 DIAGNOSIS — G309 Alzheimer's disease, unspecified: Secondary | ICD-10-CM | POA: Diagnosis not present

## 2020-01-04 DIAGNOSIS — F0280 Dementia in other diseases classified elsewhere without behavioral disturbance: Secondary | ICD-10-CM | POA: Diagnosis not present

## 2020-01-04 DIAGNOSIS — F0150 Vascular dementia without behavioral disturbance: Secondary | ICD-10-CM | POA: Diagnosis not present

## 2020-01-14 DIAGNOSIS — B182 Chronic viral hepatitis C: Secondary | ICD-10-CM | POA: Diagnosis not present

## 2020-01-14 DIAGNOSIS — F0281 Dementia in other diseases classified elsewhere with behavioral disturbance: Secondary | ICD-10-CM | POA: Diagnosis not present

## 2020-01-14 DIAGNOSIS — G309 Alzheimer's disease, unspecified: Secondary | ICD-10-CM | POA: Diagnosis not present

## 2020-01-14 DIAGNOSIS — N401 Enlarged prostate with lower urinary tract symptoms: Secondary | ICD-10-CM | POA: Diagnosis not present

## 2020-01-14 DIAGNOSIS — D649 Anemia, unspecified: Secondary | ICD-10-CM | POA: Diagnosis not present

## 2020-01-14 DIAGNOSIS — R338 Other retention of urine: Secondary | ICD-10-CM | POA: Diagnosis not present

## 2020-01-14 DIAGNOSIS — Q675 Congenital deformity of spine: Secondary | ICD-10-CM | POA: Diagnosis not present

## 2020-01-14 DIAGNOSIS — F0151 Vascular dementia with behavioral disturbance: Secondary | ICD-10-CM | POA: Diagnosis not present

## 2020-01-14 DIAGNOSIS — I10 Essential (primary) hypertension: Secondary | ICD-10-CM | POA: Diagnosis not present

## 2020-01-18 DIAGNOSIS — N401 Enlarged prostate with lower urinary tract symptoms: Secondary | ICD-10-CM | POA: Diagnosis not present

## 2020-01-18 DIAGNOSIS — I10 Essential (primary) hypertension: Secondary | ICD-10-CM | POA: Diagnosis not present

## 2020-01-18 DIAGNOSIS — F0151 Vascular dementia with behavioral disturbance: Secondary | ICD-10-CM | POA: Diagnosis not present

## 2020-01-18 DIAGNOSIS — B182 Chronic viral hepatitis C: Secondary | ICD-10-CM | POA: Diagnosis not present

## 2020-01-18 DIAGNOSIS — G309 Alzheimer's disease, unspecified: Secondary | ICD-10-CM | POA: Diagnosis not present

## 2020-01-18 DIAGNOSIS — D649 Anemia, unspecified: Secondary | ICD-10-CM | POA: Diagnosis not present

## 2020-01-18 DIAGNOSIS — Q675 Congenital deformity of spine: Secondary | ICD-10-CM | POA: Diagnosis not present

## 2020-01-18 DIAGNOSIS — F0281 Dementia in other diseases classified elsewhere with behavioral disturbance: Secondary | ICD-10-CM | POA: Diagnosis not present

## 2020-01-18 DIAGNOSIS — R338 Other retention of urine: Secondary | ICD-10-CM | POA: Diagnosis not present

## 2020-01-19 DIAGNOSIS — R338 Other retention of urine: Secondary | ICD-10-CM | POA: Diagnosis not present

## 2020-01-19 DIAGNOSIS — Q675 Congenital deformity of spine: Secondary | ICD-10-CM | POA: Diagnosis not present

## 2020-01-19 DIAGNOSIS — I10 Essential (primary) hypertension: Secondary | ICD-10-CM | POA: Diagnosis not present

## 2020-01-19 DIAGNOSIS — F0281 Dementia in other diseases classified elsewhere with behavioral disturbance: Secondary | ICD-10-CM | POA: Diagnosis not present

## 2020-01-19 DIAGNOSIS — B182 Chronic viral hepatitis C: Secondary | ICD-10-CM | POA: Diagnosis not present

## 2020-01-19 DIAGNOSIS — F0151 Vascular dementia with behavioral disturbance: Secondary | ICD-10-CM | POA: Diagnosis not present

## 2020-01-19 DIAGNOSIS — N401 Enlarged prostate with lower urinary tract symptoms: Secondary | ICD-10-CM | POA: Diagnosis not present

## 2020-01-19 DIAGNOSIS — D649 Anemia, unspecified: Secondary | ICD-10-CM | POA: Diagnosis not present

## 2020-01-19 DIAGNOSIS — G309 Alzheimer's disease, unspecified: Secondary | ICD-10-CM | POA: Diagnosis not present

## 2020-01-21 DIAGNOSIS — N401 Enlarged prostate with lower urinary tract symptoms: Secondary | ICD-10-CM | POA: Diagnosis not present

## 2020-01-21 DIAGNOSIS — F0151 Vascular dementia with behavioral disturbance: Secondary | ICD-10-CM | POA: Diagnosis not present

## 2020-01-21 DIAGNOSIS — D649 Anemia, unspecified: Secondary | ICD-10-CM | POA: Diagnosis not present

## 2020-01-21 DIAGNOSIS — B182 Chronic viral hepatitis C: Secondary | ICD-10-CM | POA: Diagnosis not present

## 2020-01-21 DIAGNOSIS — F0281 Dementia in other diseases classified elsewhere with behavioral disturbance: Secondary | ICD-10-CM | POA: Diagnosis not present

## 2020-01-21 DIAGNOSIS — I10 Essential (primary) hypertension: Secondary | ICD-10-CM | POA: Diagnosis not present

## 2020-01-21 DIAGNOSIS — G309 Alzheimer's disease, unspecified: Secondary | ICD-10-CM | POA: Diagnosis not present

## 2020-01-21 DIAGNOSIS — Q675 Congenital deformity of spine: Secondary | ICD-10-CM | POA: Diagnosis not present

## 2020-01-21 DIAGNOSIS — R338 Other retention of urine: Secondary | ICD-10-CM | POA: Diagnosis not present

## 2020-01-27 DIAGNOSIS — Q675 Congenital deformity of spine: Secondary | ICD-10-CM | POA: Diagnosis not present

## 2020-01-27 DIAGNOSIS — F0281 Dementia in other diseases classified elsewhere with behavioral disturbance: Secondary | ICD-10-CM | POA: Diagnosis not present

## 2020-01-27 DIAGNOSIS — G309 Alzheimer's disease, unspecified: Secondary | ICD-10-CM | POA: Diagnosis not present

## 2020-01-27 DIAGNOSIS — F0151 Vascular dementia with behavioral disturbance: Secondary | ICD-10-CM | POA: Diagnosis not present

## 2020-01-27 DIAGNOSIS — B182 Chronic viral hepatitis C: Secondary | ICD-10-CM | POA: Diagnosis not present

## 2020-01-27 DIAGNOSIS — N401 Enlarged prostate with lower urinary tract symptoms: Secondary | ICD-10-CM | POA: Diagnosis not present

## 2020-01-27 DIAGNOSIS — I10 Essential (primary) hypertension: Secondary | ICD-10-CM | POA: Diagnosis not present

## 2020-01-27 DIAGNOSIS — D649 Anemia, unspecified: Secondary | ICD-10-CM | POA: Diagnosis not present

## 2020-01-27 DIAGNOSIS — R338 Other retention of urine: Secondary | ICD-10-CM | POA: Diagnosis not present

## 2020-01-29 DIAGNOSIS — G309 Alzheimer's disease, unspecified: Secondary | ICD-10-CM | POA: Diagnosis not present

## 2020-01-29 DIAGNOSIS — I10 Essential (primary) hypertension: Secondary | ICD-10-CM | POA: Diagnosis not present

## 2020-01-29 DIAGNOSIS — D649 Anemia, unspecified: Secondary | ICD-10-CM | POA: Diagnosis not present

## 2020-01-29 DIAGNOSIS — F0151 Vascular dementia with behavioral disturbance: Secondary | ICD-10-CM | POA: Diagnosis not present

## 2020-01-29 DIAGNOSIS — N401 Enlarged prostate with lower urinary tract symptoms: Secondary | ICD-10-CM | POA: Diagnosis not present

## 2020-01-29 DIAGNOSIS — F0281 Dementia in other diseases classified elsewhere with behavioral disturbance: Secondary | ICD-10-CM | POA: Diagnosis not present

## 2020-01-29 DIAGNOSIS — Q675 Congenital deformity of spine: Secondary | ICD-10-CM | POA: Diagnosis not present

## 2020-01-29 DIAGNOSIS — B182 Chronic viral hepatitis C: Secondary | ICD-10-CM | POA: Diagnosis not present

## 2020-01-29 DIAGNOSIS — R338 Other retention of urine: Secondary | ICD-10-CM | POA: Diagnosis not present

## 2020-02-02 DIAGNOSIS — F0151 Vascular dementia with behavioral disturbance: Secondary | ICD-10-CM | POA: Diagnosis not present

## 2020-02-02 DIAGNOSIS — N401 Enlarged prostate with lower urinary tract symptoms: Secondary | ICD-10-CM | POA: Diagnosis not present

## 2020-02-02 DIAGNOSIS — Q675 Congenital deformity of spine: Secondary | ICD-10-CM | POA: Diagnosis not present

## 2020-02-02 DIAGNOSIS — D649 Anemia, unspecified: Secondary | ICD-10-CM | POA: Diagnosis not present

## 2020-02-02 DIAGNOSIS — B182 Chronic viral hepatitis C: Secondary | ICD-10-CM | POA: Diagnosis not present

## 2020-02-02 DIAGNOSIS — R338 Other retention of urine: Secondary | ICD-10-CM | POA: Diagnosis not present

## 2020-02-02 DIAGNOSIS — G309 Alzheimer's disease, unspecified: Secondary | ICD-10-CM | POA: Diagnosis not present

## 2020-02-02 DIAGNOSIS — F0281 Dementia in other diseases classified elsewhere with behavioral disturbance: Secondary | ICD-10-CM | POA: Diagnosis not present

## 2020-02-02 DIAGNOSIS — I10 Essential (primary) hypertension: Secondary | ICD-10-CM | POA: Diagnosis not present

## 2020-02-04 DIAGNOSIS — I10 Essential (primary) hypertension: Secondary | ICD-10-CM | POA: Diagnosis not present

## 2020-02-04 DIAGNOSIS — Q675 Congenital deformity of spine: Secondary | ICD-10-CM | POA: Diagnosis not present

## 2020-02-04 DIAGNOSIS — N401 Enlarged prostate with lower urinary tract symptoms: Secondary | ICD-10-CM | POA: Diagnosis not present

## 2020-02-04 DIAGNOSIS — F0281 Dementia in other diseases classified elsewhere with behavioral disturbance: Secondary | ICD-10-CM | POA: Diagnosis not present

## 2020-02-04 DIAGNOSIS — F0151 Vascular dementia with behavioral disturbance: Secondary | ICD-10-CM | POA: Diagnosis not present

## 2020-02-04 DIAGNOSIS — R338 Other retention of urine: Secondary | ICD-10-CM | POA: Diagnosis not present

## 2020-02-04 DIAGNOSIS — D649 Anemia, unspecified: Secondary | ICD-10-CM | POA: Diagnosis not present

## 2020-02-04 DIAGNOSIS — G309 Alzheimer's disease, unspecified: Secondary | ICD-10-CM | POA: Diagnosis not present

## 2020-02-04 DIAGNOSIS — B182 Chronic viral hepatitis C: Secondary | ICD-10-CM | POA: Diagnosis not present

## 2020-02-08 DIAGNOSIS — F0281 Dementia in other diseases classified elsewhere with behavioral disturbance: Secondary | ICD-10-CM | POA: Diagnosis not present

## 2020-02-08 DIAGNOSIS — I10 Essential (primary) hypertension: Secondary | ICD-10-CM | POA: Diagnosis not present

## 2020-02-08 DIAGNOSIS — N401 Enlarged prostate with lower urinary tract symptoms: Secondary | ICD-10-CM | POA: Diagnosis not present

## 2020-02-08 DIAGNOSIS — Q675 Congenital deformity of spine: Secondary | ICD-10-CM | POA: Diagnosis not present

## 2020-02-08 DIAGNOSIS — F0151 Vascular dementia with behavioral disturbance: Secondary | ICD-10-CM | POA: Diagnosis not present

## 2020-02-08 DIAGNOSIS — D649 Anemia, unspecified: Secondary | ICD-10-CM | POA: Diagnosis not present

## 2020-02-08 DIAGNOSIS — B182 Chronic viral hepatitis C: Secondary | ICD-10-CM | POA: Diagnosis not present

## 2020-02-08 DIAGNOSIS — R338 Other retention of urine: Secondary | ICD-10-CM | POA: Diagnosis not present

## 2020-02-08 DIAGNOSIS — G309 Alzheimer's disease, unspecified: Secondary | ICD-10-CM | POA: Diagnosis not present

## 2020-02-09 DIAGNOSIS — Q675 Congenital deformity of spine: Secondary | ICD-10-CM | POA: Diagnosis not present

## 2020-02-09 DIAGNOSIS — I10 Essential (primary) hypertension: Secondary | ICD-10-CM | POA: Diagnosis not present

## 2020-02-09 DIAGNOSIS — B182 Chronic viral hepatitis C: Secondary | ICD-10-CM | POA: Diagnosis not present

## 2020-02-09 DIAGNOSIS — F0281 Dementia in other diseases classified elsewhere with behavioral disturbance: Secondary | ICD-10-CM | POA: Diagnosis not present

## 2020-02-09 DIAGNOSIS — F0151 Vascular dementia with behavioral disturbance: Secondary | ICD-10-CM | POA: Diagnosis not present

## 2020-02-09 DIAGNOSIS — D649 Anemia, unspecified: Secondary | ICD-10-CM | POA: Diagnosis not present

## 2020-02-09 DIAGNOSIS — N401 Enlarged prostate with lower urinary tract symptoms: Secondary | ICD-10-CM | POA: Diagnosis not present

## 2020-02-09 DIAGNOSIS — R338 Other retention of urine: Secondary | ICD-10-CM | POA: Diagnosis not present

## 2020-02-09 DIAGNOSIS — G309 Alzheimer's disease, unspecified: Secondary | ICD-10-CM | POA: Diagnosis not present

## 2020-02-10 DIAGNOSIS — B182 Chronic viral hepatitis C: Secondary | ICD-10-CM | POA: Diagnosis not present

## 2020-02-10 DIAGNOSIS — N401 Enlarged prostate with lower urinary tract symptoms: Secondary | ICD-10-CM | POA: Diagnosis not present

## 2020-02-10 DIAGNOSIS — F0281 Dementia in other diseases classified elsewhere with behavioral disturbance: Secondary | ICD-10-CM | POA: Diagnosis not present

## 2020-02-10 DIAGNOSIS — I10 Essential (primary) hypertension: Secondary | ICD-10-CM | POA: Diagnosis not present

## 2020-02-10 DIAGNOSIS — Q675 Congenital deformity of spine: Secondary | ICD-10-CM | POA: Diagnosis not present

## 2020-02-10 DIAGNOSIS — G309 Alzheimer's disease, unspecified: Secondary | ICD-10-CM | POA: Diagnosis not present

## 2020-02-10 DIAGNOSIS — D649 Anemia, unspecified: Secondary | ICD-10-CM | POA: Diagnosis not present

## 2020-02-10 DIAGNOSIS — R338 Other retention of urine: Secondary | ICD-10-CM | POA: Diagnosis not present

## 2020-02-10 DIAGNOSIS — F0151 Vascular dementia with behavioral disturbance: Secondary | ICD-10-CM | POA: Diagnosis not present

## 2020-02-18 DIAGNOSIS — G309 Alzheimer's disease, unspecified: Secondary | ICD-10-CM | POA: Diagnosis not present

## 2020-02-18 DIAGNOSIS — I10 Essential (primary) hypertension: Secondary | ICD-10-CM | POA: Diagnosis not present

## 2020-02-18 DIAGNOSIS — D649 Anemia, unspecified: Secondary | ICD-10-CM | POA: Diagnosis not present

## 2020-02-18 DIAGNOSIS — N401 Enlarged prostate with lower urinary tract symptoms: Secondary | ICD-10-CM | POA: Diagnosis not present

## 2020-02-18 DIAGNOSIS — F0151 Vascular dementia with behavioral disturbance: Secondary | ICD-10-CM | POA: Diagnosis not present

## 2020-02-18 DIAGNOSIS — B182 Chronic viral hepatitis C: Secondary | ICD-10-CM | POA: Diagnosis not present

## 2020-02-18 DIAGNOSIS — R338 Other retention of urine: Secondary | ICD-10-CM | POA: Diagnosis not present

## 2020-02-18 DIAGNOSIS — Q675 Congenital deformity of spine: Secondary | ICD-10-CM | POA: Diagnosis not present

## 2020-02-18 DIAGNOSIS — F0281 Dementia in other diseases classified elsewhere with behavioral disturbance: Secondary | ICD-10-CM | POA: Diagnosis not present

## 2020-02-23 DIAGNOSIS — G309 Alzheimer's disease, unspecified: Secondary | ICD-10-CM | POA: Diagnosis not present

## 2020-02-23 DIAGNOSIS — Q675 Congenital deformity of spine: Secondary | ICD-10-CM | POA: Diagnosis not present

## 2020-02-23 DIAGNOSIS — F0151 Vascular dementia with behavioral disturbance: Secondary | ICD-10-CM | POA: Diagnosis not present

## 2020-02-23 DIAGNOSIS — D649 Anemia, unspecified: Secondary | ICD-10-CM | POA: Diagnosis not present

## 2020-02-23 DIAGNOSIS — R338 Other retention of urine: Secondary | ICD-10-CM | POA: Diagnosis not present

## 2020-02-23 DIAGNOSIS — I10 Essential (primary) hypertension: Secondary | ICD-10-CM | POA: Diagnosis not present

## 2020-02-23 DIAGNOSIS — N401 Enlarged prostate with lower urinary tract symptoms: Secondary | ICD-10-CM | POA: Diagnosis not present

## 2020-02-23 DIAGNOSIS — B182 Chronic viral hepatitis C: Secondary | ICD-10-CM | POA: Diagnosis not present

## 2020-02-23 DIAGNOSIS — F0281 Dementia in other diseases classified elsewhere with behavioral disturbance: Secondary | ICD-10-CM | POA: Diagnosis not present

## 2020-02-26 DIAGNOSIS — F0151 Vascular dementia with behavioral disturbance: Secondary | ICD-10-CM | POA: Diagnosis not present

## 2020-02-26 DIAGNOSIS — Q675 Congenital deformity of spine: Secondary | ICD-10-CM | POA: Diagnosis not present

## 2020-02-26 DIAGNOSIS — N401 Enlarged prostate with lower urinary tract symptoms: Secondary | ICD-10-CM | POA: Diagnosis not present

## 2020-02-26 DIAGNOSIS — F0281 Dementia in other diseases classified elsewhere with behavioral disturbance: Secondary | ICD-10-CM | POA: Diagnosis not present

## 2020-02-26 DIAGNOSIS — I10 Essential (primary) hypertension: Secondary | ICD-10-CM | POA: Diagnosis not present

## 2020-02-26 DIAGNOSIS — R338 Other retention of urine: Secondary | ICD-10-CM | POA: Diagnosis not present

## 2020-02-26 DIAGNOSIS — G309 Alzheimer's disease, unspecified: Secondary | ICD-10-CM | POA: Diagnosis not present

## 2020-02-26 DIAGNOSIS — B182 Chronic viral hepatitis C: Secondary | ICD-10-CM | POA: Diagnosis not present

## 2020-02-26 DIAGNOSIS — D649 Anemia, unspecified: Secondary | ICD-10-CM | POA: Diagnosis not present

## 2020-03-01 DIAGNOSIS — Q675 Congenital deformity of spine: Secondary | ICD-10-CM | POA: Diagnosis not present

## 2020-03-01 DIAGNOSIS — R338 Other retention of urine: Secondary | ICD-10-CM | POA: Diagnosis not present

## 2020-03-01 DIAGNOSIS — F0281 Dementia in other diseases classified elsewhere with behavioral disturbance: Secondary | ICD-10-CM | POA: Diagnosis not present

## 2020-03-01 DIAGNOSIS — D649 Anemia, unspecified: Secondary | ICD-10-CM | POA: Diagnosis not present

## 2020-03-01 DIAGNOSIS — F0151 Vascular dementia with behavioral disturbance: Secondary | ICD-10-CM | POA: Diagnosis not present

## 2020-03-01 DIAGNOSIS — I10 Essential (primary) hypertension: Secondary | ICD-10-CM | POA: Diagnosis not present

## 2020-03-01 DIAGNOSIS — N401 Enlarged prostate with lower urinary tract symptoms: Secondary | ICD-10-CM | POA: Diagnosis not present

## 2020-03-01 DIAGNOSIS — G309 Alzheimer's disease, unspecified: Secondary | ICD-10-CM | POA: Diagnosis not present

## 2020-03-01 DIAGNOSIS — B182 Chronic viral hepatitis C: Secondary | ICD-10-CM | POA: Diagnosis not present

## 2020-03-03 DIAGNOSIS — G309 Alzheimer's disease, unspecified: Secondary | ICD-10-CM | POA: Diagnosis not present

## 2020-03-03 DIAGNOSIS — F0281 Dementia in other diseases classified elsewhere with behavioral disturbance: Secondary | ICD-10-CM | POA: Diagnosis not present

## 2020-03-03 DIAGNOSIS — F0151 Vascular dementia with behavioral disturbance: Secondary | ICD-10-CM | POA: Diagnosis not present

## 2020-03-03 DIAGNOSIS — Q675 Congenital deformity of spine: Secondary | ICD-10-CM | POA: Diagnosis not present

## 2020-03-03 DIAGNOSIS — R338 Other retention of urine: Secondary | ICD-10-CM | POA: Diagnosis not present

## 2020-03-03 DIAGNOSIS — D649 Anemia, unspecified: Secondary | ICD-10-CM | POA: Diagnosis not present

## 2020-03-03 DIAGNOSIS — I10 Essential (primary) hypertension: Secondary | ICD-10-CM | POA: Diagnosis not present

## 2020-03-03 DIAGNOSIS — B182 Chronic viral hepatitis C: Secondary | ICD-10-CM | POA: Diagnosis not present

## 2020-03-03 DIAGNOSIS — N401 Enlarged prostate with lower urinary tract symptoms: Secondary | ICD-10-CM | POA: Diagnosis not present

## 2020-03-07 DIAGNOSIS — F0151 Vascular dementia with behavioral disturbance: Secondary | ICD-10-CM | POA: Diagnosis not present

## 2020-03-07 DIAGNOSIS — D649 Anemia, unspecified: Secondary | ICD-10-CM | POA: Diagnosis not present

## 2020-03-07 DIAGNOSIS — R338 Other retention of urine: Secondary | ICD-10-CM | POA: Diagnosis not present

## 2020-03-07 DIAGNOSIS — Q675 Congenital deformity of spine: Secondary | ICD-10-CM | POA: Diagnosis not present

## 2020-03-07 DIAGNOSIS — G309 Alzheimer's disease, unspecified: Secondary | ICD-10-CM | POA: Diagnosis not present

## 2020-03-07 DIAGNOSIS — N401 Enlarged prostate with lower urinary tract symptoms: Secondary | ICD-10-CM | POA: Diagnosis not present

## 2020-03-07 DIAGNOSIS — I10 Essential (primary) hypertension: Secondary | ICD-10-CM | POA: Diagnosis not present

## 2020-03-07 DIAGNOSIS — B182 Chronic viral hepatitis C: Secondary | ICD-10-CM | POA: Diagnosis not present

## 2020-03-07 DIAGNOSIS — F0281 Dementia in other diseases classified elsewhere with behavioral disturbance: Secondary | ICD-10-CM | POA: Diagnosis not present

## 2020-03-09 DIAGNOSIS — I10 Essential (primary) hypertension: Secondary | ICD-10-CM | POA: Diagnosis not present

## 2020-03-09 DIAGNOSIS — Q675 Congenital deformity of spine: Secondary | ICD-10-CM | POA: Diagnosis not present

## 2020-03-09 DIAGNOSIS — B182 Chronic viral hepatitis C: Secondary | ICD-10-CM | POA: Diagnosis not present

## 2020-03-09 DIAGNOSIS — D649 Anemia, unspecified: Secondary | ICD-10-CM | POA: Diagnosis not present

## 2020-03-09 DIAGNOSIS — N401 Enlarged prostate with lower urinary tract symptoms: Secondary | ICD-10-CM | POA: Diagnosis not present

## 2020-03-09 DIAGNOSIS — G309 Alzheimer's disease, unspecified: Secondary | ICD-10-CM | POA: Diagnosis not present

## 2020-03-09 DIAGNOSIS — F0151 Vascular dementia with behavioral disturbance: Secondary | ICD-10-CM | POA: Diagnosis not present

## 2020-03-09 DIAGNOSIS — R338 Other retention of urine: Secondary | ICD-10-CM | POA: Diagnosis not present

## 2020-03-09 DIAGNOSIS — F0281 Dementia in other diseases classified elsewhere with behavioral disturbance: Secondary | ICD-10-CM | POA: Diagnosis not present

## 2020-04-22 DIAGNOSIS — D649 Anemia, unspecified: Secondary | ICD-10-CM | POA: Diagnosis not present

## 2020-04-22 DIAGNOSIS — R739 Hyperglycemia, unspecified: Secondary | ICD-10-CM | POA: Diagnosis not present

## 2020-04-22 DIAGNOSIS — Z79899 Other long term (current) drug therapy: Secondary | ICD-10-CM | POA: Diagnosis not present

## 2020-04-29 DIAGNOSIS — Z79899 Other long term (current) drug therapy: Secondary | ICD-10-CM | POA: Diagnosis not present

## 2020-04-29 DIAGNOSIS — N400 Enlarged prostate without lower urinary tract symptoms: Secondary | ICD-10-CM | POA: Diagnosis not present

## 2020-04-29 DIAGNOSIS — F0390 Unspecified dementia without behavioral disturbance: Secondary | ICD-10-CM | POA: Diagnosis not present

## 2020-04-29 DIAGNOSIS — I10 Essential (primary) hypertension: Secondary | ICD-10-CM | POA: Diagnosis not present

## 2020-04-29 DIAGNOSIS — Z125 Encounter for screening for malignant neoplasm of prostate: Secondary | ICD-10-CM | POA: Diagnosis not present

## 2020-04-29 DIAGNOSIS — Z23 Encounter for immunization: Secondary | ICD-10-CM | POA: Diagnosis not present

## 2020-04-29 DIAGNOSIS — R739 Hyperglycemia, unspecified: Secondary | ICD-10-CM | POA: Diagnosis not present

## 2020-05-02 DIAGNOSIS — D649 Anemia, unspecified: Secondary | ICD-10-CM | POA: Diagnosis not present

## 2020-05-02 DIAGNOSIS — G309 Alzheimer's disease, unspecified: Secondary | ICD-10-CM | POA: Diagnosis not present

## 2020-05-02 DIAGNOSIS — B1920 Unspecified viral hepatitis C without hepatic coma: Secondary | ICD-10-CM | POA: Diagnosis not present

## 2020-05-02 DIAGNOSIS — K7469 Other cirrhosis of liver: Secondary | ICD-10-CM | POA: Diagnosis not present

## 2020-05-02 DIAGNOSIS — K625 Hemorrhage of anus and rectum: Secondary | ICD-10-CM | POA: Diagnosis not present

## 2020-05-02 DIAGNOSIS — R634 Abnormal weight loss: Secondary | ICD-10-CM | POA: Diagnosis not present

## 2020-05-02 DIAGNOSIS — Z8546 Personal history of malignant neoplasm of prostate: Secondary | ICD-10-CM | POA: Diagnosis not present

## 2020-05-02 DIAGNOSIS — D500 Iron deficiency anemia secondary to blood loss (chronic): Secondary | ICD-10-CM | POA: Diagnosis not present

## 2020-05-02 DIAGNOSIS — Z8619 Personal history of other infectious and parasitic diseases: Secondary | ICD-10-CM | POA: Diagnosis not present

## 2020-05-11 DIAGNOSIS — A530 Latent syphilis, unspecified as early or late: Secondary | ICD-10-CM | POA: Diagnosis not present

## 2020-05-11 DIAGNOSIS — G301 Alzheimer's disease with late onset: Secondary | ICD-10-CM | POA: Diagnosis not present

## 2020-05-11 DIAGNOSIS — F0280 Dementia in other diseases classified elsewhere without behavioral disturbance: Secondary | ICD-10-CM | POA: Diagnosis not present

## 2020-05-27 ENCOUNTER — Other Ambulatory Visit: Payer: Medicaid Other

## 2020-05-30 ENCOUNTER — Encounter: Admission: RE | Payer: Self-pay | Source: Home / Self Care

## 2020-05-30 ENCOUNTER — Other Ambulatory Visit: Payer: Self-pay | Admitting: Gastroenterology

## 2020-05-30 ENCOUNTER — Telehealth: Payer: Self-pay

## 2020-05-30 ENCOUNTER — Ambulatory Visit: Admission: RE | Admit: 2020-05-30 | Payer: Medicare HMO | Source: Home / Self Care | Admitting: Internal Medicine

## 2020-05-30 DIAGNOSIS — R634 Abnormal weight loss: Secondary | ICD-10-CM

## 2020-05-30 DIAGNOSIS — D500 Iron deficiency anemia secondary to blood loss (chronic): Secondary | ICD-10-CM

## 2020-05-30 DIAGNOSIS — B1920 Unspecified viral hepatitis C without hepatic coma: Secondary | ICD-10-CM

## 2020-05-30 SURGERY — COLONOSCOPY
Anesthesia: General

## 2020-05-30 NOTE — Telephone Encounter (Signed)
Received call from patient's sister requesting to reschedule appointment. She states patient has a "hacking" cough and she just wants to be safe. RN advised her to have the patient tested for COVID. She states patient has no other symptoms,  just the cough. Denies fevers, sister states she will get the patient tested. Appointment rescheduled.   Beryle Flock, RN

## 2020-05-31 ENCOUNTER — Ambulatory Visit: Payer: Medicaid Other | Admitting: Infectious Diseases

## 2020-06-09 ENCOUNTER — Other Ambulatory Visit
Admission: RE | Admit: 2020-06-09 | Discharge: 2020-06-09 | Disposition: A | Discharge status: Home / Self Care | Payer: Medicare HMO | Attending: Infectious Diseases | Admitting: Infectious Diseases

## 2020-06-09 ENCOUNTER — Ambulatory Visit: Payer: Medicare HMO | Attending: Infectious Diseases | Admitting: Infectious Diseases

## 2020-06-09 ENCOUNTER — Encounter: Payer: Self-pay | Admitting: Infectious Diseases

## 2020-06-09 VITALS — BP 173/89 | HR 92 | Temp 97.8°F | Wt 217.0 lb

## 2020-06-09 DIAGNOSIS — A530 Latent syphilis, unspecified as early or late: Secondary | ICD-10-CM

## 2020-06-09 DIAGNOSIS — F0280 Dementia in other diseases classified elsewhere without behavioral disturbance: Secondary | ICD-10-CM

## 2020-06-09 DIAGNOSIS — Z79899 Other long term (current) drug therapy: Secondary | ICD-10-CM | POA: Diagnosis not present

## 2020-06-09 DIAGNOSIS — F0150 Vascular dementia without behavioral disturbance: Secondary | ICD-10-CM | POA: Diagnosis not present

## 2020-06-09 DIAGNOSIS — Z7901 Long term (current) use of anticoagulants: Secondary | ICD-10-CM | POA: Insufficient documentation

## 2020-06-09 DIAGNOSIS — G309 Alzheimer's disease, unspecified: Secondary | ICD-10-CM

## 2020-06-09 NOTE — Patient Instructions (Signed)
You are here for a positive RPR test- I dont see a confirmatory TPA test. So we will repeat the test oday- if both are positive you will need LP. I will discuss with your neurologist

## 2020-06-09 NOTE — Progress Notes (Addendum)
NAME: Daniel Knapp  DOB: 1951-05-12  MRN: [id number]  Date/Time: 06/09/2020 12:40 PM  Subjective:    ?Patient is here with his sister.  No history available from patient.  He is referred by neurologist. Daniel Knapp is a 69 y.o. with dementia is referred to me for positive RPR he had in 2019 at a titer of 1:1 Patient is followed by neurologist and in 2019 had a work-up for dementia and was found to have RPR of 1 is to 1.  During his last visit to the neurologist he was asked to see me. As per his sister she is not sure why the he had an infection in his younger days.  She is not sure whether he had taken treatment.  Patient is unable to give any history.  He is awake and alert and ambulating but cannot comprehend my questions. Past Medical History:  Diagnosis Date  . Acute cystitis with hematuria   . HTN (hypertension)   . Prostate cancer ([place]) 02/2016  . Prostate enlargement   . Urinary catheter insertion/adjustment/removal   . Urinary retention   . UTI (lower urinary tract infection)   . UTI symptoms   . Weight loss     Past Surgical History:  Procedure Laterality Date  . NO PAST SURGERIES    . TRANSURETHRAL RESECTION OF BLADDER TUMOR N/A 08/17/2015   Procedure: TRANSURETHRAL RESECTION OF BLADDER TUMOR (TURBT);  Surgeon: [name], MD;  Location: [facility] ORS;  Service: Urology;  Laterality: N/A;  . TRANSURETHRAL RESECTION OF PROSTATE N/A 08/17/2015   Procedure: TRANSURETHRAL RESECTION OF THE PROSTATE (TURP);  Surgeon: [name], MD;  Location: [facility] ORS;  Service: Urology;  Laterality: N/A;    Social History   Socioeconomic History  . Marital status: Single    Spouse name: Not on file  . Number of children: Not on file  . Years of education: Not on file  . Highest education level: Not on file  Occupational History  . Not on file  Tobacco Use  . Smoking status: Never Smoker  . Smokeless tobacco: Never Used  Substance and Sexual Activity  . Alcohol use:  Yes    Alcohol/week: 0.0 standard drinks    Comment: moderate  . Drug use: No  . Sexual activity: Not on file  Other Topics Concern  . Not on file  Social History Narrative  . Not on file   Social Determinants of Health   Financial Resource Strain: Not on file  Food Insecurity: Not on file  Transportation Needs: Not on file  Physical Activity: Not on file  Stress: Not on file  Social Connections: Not on file  Intimate Partner Violence: Not on file    Family History  Problem Relation Age of Onset  . Heart disease Mother   . Cancer Father   . Kidney disease Neg Hx   . Prostate cancer Neg Hx    No Known Allergies   ? Current Outpatient Medications  Medication Sig Dispense Refill  . amLODipine (NORVASC) 10 MG tablet Take 10 mg by mouth daily.    . finasteride (PROSCAR) 5 MG tablet Take 5 mg by mouth daily. Reported on 08/17/2015    . HARVONI 90-400 MG TABS     . metoprolol tartrate (LOPRESSOR) 25 MG tablet Take 25 mg by mouth 2 (two) times daily. Reported on 08/17/2015    . sildenafil (REVATIO) 20 MG tablet Take 1 to 5 tabs PO daily prn 30 tablet 0  .  tamsulosin (FLOMAX) 0.4 MG CAPS capsule Take 0.4 mg by mouth daily. Reported on 09/01/2015     Current Facility-Administered Medications  Medication Dose Route Frequency Provider Last Rate Last Admin  . lidocaine (XYLOCAINE) 2 % jelly 1 application  1 application Urethral Once McGowan, Shannon A, PA-C         Abtx:  Anti-infectives (From admission, onward)   None      REVIEW OF SYSTEMS: Not available from patient As per sister he is very forgetful.  He lives on his own and usually manages by himself except when it comes to personal hygiene. Objective:  VITALS:  BP (!) 173/89   Pulse 92   Temp 97.8 F (36.6 C) (Oral)   Wt 217 lb (98.4 kg)   BMI 28.63 kg/m  PHYSICAL EXAM:  General: Alert, cooperative, but unable to state where he is or the date or time.  Head: Normocephalic, without obvious abnormality,  atraumatic. Eyes: Conjunctivae clear, anicteric sclerae. Pupils are equal ENT Nares normal. No drainage or sinus tenderness. Lips, mucosa, and tongue normal. No Thrush Neck: Supple, symmetrical, no adenopathy, thyroid: non tender no carotid bruit and no JVD. Back: No CVA tenderness. Lungs: Clear to auscultation bilaterally. No Wheezing or Rhonchi. No rales. Heart: Regular rate and rhythm, no murmur, rub or gallop. Abdomen: Soft, non-tender,not distended. Bowel sounds normal. No masses Extremities: atraumatic, no cyanosis. No edema. No clubbing Skin: No obvious rash Lymph: Cervical, supraclavicular normal. Neurologic: Ambulating, no obvious focal deficit. Pertinent Labs Lab Results CBC    Component Value Date/Time   WBC 3.4 (L) 07/03/2016 0848   RBC 4.76 07/03/2016 0848   HGB 15.3 07/03/2016 0848   HGB 15.9 09/16/2014 1337   HCT 43.3 07/03/2016 0848   HCT 45.7 09/16/2014 1337   PLT 116 (L) 07/03/2016 0848   PLT 151 09/16/2014 1337   MCV 91.0 07/03/2016 0848   MCV 95 09/16/2014 1337   MCH 32.1 07/03/2016 0848   MCHC 35.3 07/03/2016 0848   RDW 12.7 07/03/2016 0848   RDW 12.1 09/16/2014 1337   LYMPHSABS 2.1 08/05/2015 0847   LYMPHSABS 1.0 09/16/2014 1337   MONOABS 0.3 08/05/2015 0847   MONOABS 0.7 09/16/2014 1337   EOSABS 0.3 08/05/2015 0847   EOSABS 0.0 09/16/2014 1337   BASOSABS 0.0 08/05/2015 0847   BASOSABS 0.1 09/16/2014 1337    CMP Latest Ref Rng & Units 02/21/2016 08/19/2015 08/18/2015  Glucose 65 - 99 mg/dL - 112(H) 123(H)  BUN 6 - 20 mg/dL - 9 8  Creatinine 0.61 - 1.24 mg/dL 1.00 0.99 1.03  Sodium 135 - 145 mmol/L - 139 135  Potassium 3.5 - 5.1 mmol/L - 4.1 3.6  Chloride 101 - 111 mmol/L - 110 103  CO2 22 - 32 mmol/L - 27 29  Calcium 8.9 - 10.3 mg/dL - 8.1(L) 7.9(L)  Total Protein g/dL - - -  Total Bilirubin mg/dL - - -  Alkaline Phos U/L - - -  AST U/L - - -  ALT U/L - - -      ? Impression/Recommendation ? 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  Followed by neurology.  Is referred to me for RPR of 1:1   I do not see a TPA which is a confirmatory test.   Sister is not aware of him having syphilis or being treated for it.  When patient is unable to give any history. So today I will send another test for RPR to be  ReFlexed  to TPA. If both the RPR and TPA is positive then he will need lumbar puncture to r/o  neurosyphilis and  treatment accordingly. Will call and discuss once the lab results are available. HIV is negative. HIV test is neg on 06/12/18  Dementia on multiple medications Low B12 on supplements   _____Discussed with sister in detail.  Note:  This document was prepared using Dragon voice recognition software and may include unintentional dictation errors.  RPR 1;1 TPA resulted on 06/14/19 and it is negative. So this is a false positive RPR- pt does not have syphilis No treatment needed. Informed the neurologist and patient

## 2020-06-10 LAB — RPR
RPR Ser Ql: REACTIVE — AB
RPR Titer: 1:1 {titer}

## 2020-06-13 LAB — T.PALLIDUM AB, TOTAL: T Pallidum Abs: NONREACTIVE

## 2020-06-16 ENCOUNTER — Telehealth: Payer: Self-pay

## 2020-06-16 NOTE — Telephone Encounter (Signed)
Advised sister Corrie Dandy that RPR was negative this time and last labs were false positive, I explained this means neg for syphilis and no treatment is needed.

## 2020-08-02 DIAGNOSIS — K429 Umbilical hernia without obstruction or gangrene: Secondary | ICD-10-CM | POA: Diagnosis not present

## 2020-08-02 DIAGNOSIS — K439 Ventral hernia without obstruction or gangrene: Secondary | ICD-10-CM | POA: Diagnosis not present

## 2020-08-04 ENCOUNTER — Ambulatory Visit: Payer: Self-pay | Admitting: General Surgery

## 2020-08-04 DIAGNOSIS — K439 Ventral hernia without obstruction or gangrene: Secondary | ICD-10-CM | POA: Diagnosis not present

## 2020-08-04 NOTE — H&P (Signed)
PATIENT PROFILE: Daniel Knapp is a 70 y.o. male who presents to the Clinic for consultation at the request of Dr. Babaoff for evaluation of umbilical and ventral hernia.  PCP:  Babaoff, Marcus Elijah, MD  HISTORY OF PRESENT ILLNESS: Daniel Knapp reports having an umbilical hernia and ventral hernia for years. The epigastric hernia has been more symptomatic recently. The sister reports that the pain has been getting more intense. He even bend over with pain recently. Pain localized to area of epigastric and umbilical hernia. Pain does not radiates to other part of the body. There has been no alleviating factors. Pain aggravate when hernia content are out. Denies abdominal distention, nausea or vomiting.    PROBLEM LIST: Problem List  Date Reviewed: 04/29/2020         Noted   Dementia without behavioral disturbance (CMS-HCC) 06/30/2019   Elevated blood sugar level, unspecified 06/30/2019   Benign prostatic hyperplasia with weak urinary stream 03/11/2018   Prostate cancer (CMS-HCC) 03/11/2018   Essential hypertension, benign 03/11/2018   BPH (benign prostatic hyperplasia) 08/17/2015   Urinary retention 11/10/2014   Overview    Formatting of this note might be different from the original. Patient currently has an indwelling Foley. We discussed [facility] but patient is not interested in doing to the type of work he engages in.         GENERAL REVIEW OF SYSTEMS:   General ROS: negative for - chills, fatigue, fever, weight gain or weight loss Allergy and Immunology ROS: negative for - hives  Hematological and Lymphatic ROS: negative for - bleeding problems or bruising, negative for palpable nodes Endocrine ROS: negative for - heat or cold intolerance, hair changes Respiratory ROS: negative for - cough, shortness of breath or wheezing Cardiovascular ROS: no chest pain or palpitations GI ROS: negative for nausea, vomiting, diarrhea, constipation. Positive for abdominal pain.  Musculoskeletal ROS:  negative for - joint swelling or muscle pain Neurological ROS: negative for - confusion, syncope Dermatological ROS: negative for pruritus and rash Psychiatric: negative for anxiety, depression, difficulty sleeping. Positive for memory loss  MEDICATIONS: Current Outpatient Medications  Medication Sig Dispense Refill  . amLODIPine (NORVASC) 10 MG tablet TAKE 1 TABLET BY MOUTH EVERY DAY 90 tablet 3  . cyanocobalamin (VITAMIN B12) 1000 MCG tablet Take 1,000 mcg by mouth once daily    . divalproex (DEPAKOTE ER) 250 MG ER tablet Take 1 tablet (250 mg total) by mouth once daily 30 tablet 5  . ferrous sulfate 325 (65 FE) MG tablet Take 1 tablet (325 mg total) by mouth daily with breakfast 30 tablet 11  . finasteride (PROSCAR) 5 mg tablet TAKE 1 TABLET BY MOUTH EVERY DAY 90 tablet 3  . memantine (NAMENDA) 5 MG tablet TAKE 1 TABLET BY MOUTH TWICE A DAY 180 tablet 1  . multivitamin tablet Take 1 tablet by mouth once daily    . QUEtiapine (SEROQUEL) 25 MG tablet Take 1 tablet (25 mg total) by mouth nightly for 30 days 30 tablet 0  . tamsulosin (FLOMAX) 0.4 mg capsule TAKE 1 CAPSULE (0.4 MG TOTAL) BY MOUTH ONCE DAILY TAKE 30 MINUTES AFTER SAME MEAL EACH DAY. 90 capsule 3  . donepeziL (ARICEPT) 10 MG tablet Take 1 tablet (10 mg total) by mouth nightly for 90 days 90 tablet 3   No current facility-administered medications for this visit.    ALLERGIES: Patient has no known allergies.  PAST MEDICAL HISTORY: Past Medical History:  Diagnosis Date  . BPH (benign prostatic hyperplasia)   .   Hypertension   . Prostate cancer (CMS-HCC)     PAST SURGICAL HISTORY: Past Surgical History:  Procedure Laterality Date  . RESECTION TRANSURETHRAL PROSTATE  2017     FAMILY HISTORY: Family History  Problem Relation Age of Onset  . Diabetes type II Mother   . Heart disease Mother   . Alzheimer's disease Mother   . Dementia Father   . Alzheimer's disease Father   . Liver disease Brother   . Diabetes type  II Sister   . High blood pressure (Hypertension) Sister   . High blood pressure (Hypertension) Brother      SOCIAL HISTORY: Social History   Socioeconomic History  . Marital status: Single    Spouse name: Not on file  . Number of children: Not on file  . Years of education: Not on file  . Highest education level: Not on file  Occupational History  . Not on file  Tobacco Use  . Smoking status: Never Smoker  . Smokeless tobacco: Never Used  Substance and Sexual Activity  . Alcohol use: Not Currently  . Drug use: Not Currently  . Sexual activity: Not Currently  Other Topics Concern  . Not on file  Social History Narrative  . Not on file   Social Determinants of Health   Financial Resource Strain: Not on file  Food Insecurity: Not on file  Transportation Needs: Not on file    PHYSICAL EXAM: Vitals:   08/04/20 1513  BP: (!) 142/75  Pulse: 65   Body mass index is 27.73 kg/m. Weight: 98 kg (216 lb)   GENERAL: Alert, active, oriented x3  HEENT: Pupils equal reactive to light. Extraocular movements are intact. Sclera clear. Palpebral conjunctiva normal red color.Pharynx clear.  NECK: Supple with no palpable mass and no adenopathy.  LUNGS: Sound clear with no rales rhonchi or wheezes.  HEART: Regular rhythm S1 and S2 without murmur.  ABDOMEN: Soft and depressible, nontender with no palpable mass, no hepatomegaly. There is an epigastric hernia and umbilical hernia, both reducible.   EXTREMITIES: Well-developed well-nourished symmetrical with no dependent edema.  NEUROLOGICAL: Awake alert oriented, facial expression symmetrical, moving all extremities.  REVIEW OF DATA: I have reviewed the following data today: Initial consult on 08/04/2020  Component Date Value  . Glucose 08/04/2020 100   . Sodium 08/04/2020 140   . Potassium 08/04/2020 4.0   . Chloride 08/04/2020 103   . Carbon Dioxide (CO2) 08/04/2020 31.9   . Urea Nitrogen (BUN) 08/04/2020 14   .  Creatinine 08/04/2020 1.0   . Glomerular Filtration Ra* 08/04/2020 90   . Calcium 08/04/2020 9.8   . AST  08/04/2020 31   . ALT  08/04/2020 29   . Alk Phos (alkaline Phosp* 08/04/2020 76   . Albumin 08/04/2020 4.5   . Bilirubin, Total 08/04/2020 0.4   . Protein, Total 08/04/2020 8.2 (!)  . A/G Ratio 08/04/2020 1.2   . WBC (White Blood Cell Co* 08/04/2020 4.2   . RBC (Red Blood Cell Coun* 08/04/2020 4.41 (!)  . Hemoglobin 08/04/2020 13.0 (!)  . Hematocrit 08/04/2020 40.6   . MCV (Mean Corpuscular Vo* 08/04/2020 92.1   . MCH (Mean Corpuscular He* 08/04/2020 29.5   . MCHC (Mean Corpuscular H* 08/04/2020 32.0   . Platelet Count 08/04/2020 148 (!)  . RDW-CV (Red Cell Distrib* 08/04/2020 13.9   . MPV (Mean Platelet Volum* 08/04/2020 11.5   . Neutrophils 08/04/2020 2.17   . Lymphocytes 08/04/2020 1.50   . Monocytes 08/04/2020 0.35   .  Eosinophils 08/04/2020 0.18   . Basophils 08/04/2020 0.03   . Neutrophil % 08/04/2020 51.2   . Lymphocyte % 08/04/2020 35.5   . Monocyte % 08/04/2020 8.3   . Eosinophil % 08/04/2020 4.3   . Basophil% 08/04/2020 0.7   . Immature Granulocyte % 08/04/2020 0.0   . Immature Granulocyte Cou* 08/04/2020 0.00      ASSESSMENT: Mr. Blaisdell is a 71 y.o. male presenting for consultation for epigastric and umbilical hernia.  Patient with two moderate size ventral hernias (umbilical and epigastric). The patient and the sister were oriented about what is a ventral hernia and its implication. They were oriented about the usual problems with hernias including pain and incarceration/strangulation. Both were oriented about the recommendations of repair. Patient has Alzheimer dementia but is able to understand what is going on. Currently he seem stable from medical condition. No significant cardiac history. The fact that patient is getting more symptomatic with significant pain specially from the epigastric area I consider that patient will benefit of repair now that he is  within stable condition, instead of waiting when patient can be is worse medical and mental shape. The patient and the sister were oriented about the surgical technique and the risk of surgery that includes: bleeding, infection, injury to bowel, liver, stomach, intra abdominal abscess, among others. They also understand the risk of stroke, myocardial infarction, pneumonia, among other medical complications. Both reports understood and agreed to proceed.   Ventral hernia without obstruction or gangrene [K43.9]  PLAN: 1. Robotic assisted laparoscopic ventral hernia repair (16109) 2. Internal medicine clearance 3. CBC, CMP 4. Avoid taking aspirin 5 days before surgery 5. Contact us if you have any concern.   Patient and her sister verbalized understanding, all questions were answered, and were agreeable with the plan outlined above.     Herbert Pun, MD  Electronically signed by Herbert Pun, MD

## 2020-09-12 DIAGNOSIS — G301 Alzheimer's disease with late onset: Secondary | ICD-10-CM | POA: Diagnosis not present

## 2020-09-12 DIAGNOSIS — F0280 Dementia in other diseases classified elsewhere without behavioral disturbance: Secondary | ICD-10-CM | POA: Diagnosis not present

## 2020-09-22 ENCOUNTER — Encounter
Admission: RE | Admit: 2020-09-22 | Discharge: 2020-09-22 | Disposition: A | Discharge status: Home / Self Care | Payer: Medicare HMO | Source: Ambulatory Visit | Attending: General Surgery | Admitting: General Surgery

## 2020-09-22 ENCOUNTER — Other Ambulatory Visit: Payer: Self-pay

## 2020-09-22 HISTORY — DX: Latent syphilis, unspecified as early or late: A53.0

## 2020-09-22 HISTORY — DX: Inflammatory liver disease, unspecified: K75.9

## 2020-09-22 HISTORY — DX: Anemia, unspecified: D64.9

## 2020-09-22 HISTORY — DX: Unspecified dementia, unspecified severity, without behavioral disturbance, psychotic disturbance, mood disturbance, and anxiety: F03.90

## 2020-09-22 NOTE — Patient Instructions (Addendum)
Your procedure is scheduled on:09-30-20 FRIDAY Report to the Registration Desk on the 1st floor of the [facility]-Then proceed to the 2nd floor Surgery Desk in the [facility] To find out your arrival time, please call [phone] between 1PM - 3PM on:09-29-20 THURSDAY  REMEMBER: Instructions that are not followed completely may result in serious medical risk, up to and including death; or upon the discretion of your surgeon and anesthesiologist your surgery may need to be rescheduled.  Do not eat food after midnight the night before surgery.  No gum chewing, lozengers or hard candies.  You may however, drink CLEAR liquids up to 2 hours before you are scheduled to arrive for your surgery. Do not drink anything within 2 hours of your scheduled arrival time.  Clear liquids include: - water  - apple juice without pulp - gatorade (not RED) - black coffee or tea (Do NOT add milk or creamers to the coffee or tea) Do NOT drink anything that is not on this list.  TAKE THESE MEDICATIONS THE MORNING OF SURGERY WITH A SIP OF WATER: -NORVASC (AMLODIPINE) -ARICEPT (DONEPEZIL) -NAMENDA (MEMANTINE) -FLOMAX (TAMSULOSIN) -PROSCAR (FINASTERIDE) -DEPAKOTE (DIVALPROEX)  One week prior to surgery: Stop Anti-inflammatories (NSAIDS) such as Advil, Aleve, Ibuprofen, Motrin, Naproxen, Naprosyn and Aspirin based products such as Excedrin, Goodys Powder, BC Powder-OK TO TAKE TYLENOL IF NEEDED  Stop ANY OVER THE COUNTER supplements until after surgery-HOWEVER, YOU MAY CONTINUE YOUR MULTIVITAMIN, VITAMIN B12 AND FERROUS SULFATE UP UNTIL THE DAY PRIOR TO SURGERY  No Alcohol for 24 hours before or after surgery.  No Smoking including e-cigarettes for 24 hours prior to surgery.  No chewable tobacco products for at least 6 hours prior to surgery.  No nicotine patches on the day of surgery.  Do not use any "recreational" drugs for at least a week prior to your surgery.  Please be advised that the  combination of cocaine and anesthesia may have negative outcomes, up to and including death. If you test positive for cocaine, your surgery will be cancelled.  On the morning of surgery brush your teeth with toothpaste and water, you may rinse your mouth with mouthwash if you wish. Do not swallow any toothpaste or mouthwash.  Do not wear jewelry, make-up, hairpins, clips or nail polish.  Do not wear lotions, powders, or perfumes.   Do not shave body from the neck down 48 hours prior to surgery just in case you cut yourself which could leave a site for infection.  Also, freshly shaved skin may become irritated if using the CHG soap.  Contact lenses, hearing aids and dentures may not be worn into surgery.  Do not bring valuables to the hospital. [facility] is not responsible for any missing/lost belongings or valuables.   Use CHG wipes as directed on instruction sheet.  Notify your doctor if there is any change in your medical condition (cold, fever, infection).  Wear comfortable clothing (specific to your surgery type) to the hospital.  Plan for stool softeners for home use; pain medications have a tendency to cause constipation. You can also help prevent constipation by eating foods high in fiber such as fruits and vegetables and drinking plenty of fluids as your diet allows.  After surgery, you can help prevent lung complications by doing breathing exercises.  Take deep breaths and cough every 1-2 hours. Your doctor may order a device called an Incentive Spirometer to help you take deep breaths. When coughing or sneezing, hold a pillow firmly against your  incision with both hands. This is called "splinting." Doing this helps protect your incision. It also decreases belly discomfort.  If you are being admitted to the hospital overnight, leave your suitcase in the car. After surgery it may be brought to your room.  If you are being discharged the day of surgery, you will not be  allowed to drive home. You will need a responsible adult (18 years or older) to drive you home and stay with you that night.   If you are taking public transportation, you will need to have a responsible adult (18 years or older) with you. Please confirm with your physician that it is acceptable to use public transportation.   Please call the [place] Dept. at [phone] if you have any questions about these instructions.  Surgery Visitation Policy:  Patients undergoing a surgery or procedure may have one family member or support person with them as long as that person is not COVID-19 positive or experiencing its symptoms.  That person may remain in the waiting area during the procedure.  Inpatient Visitation:    Visiting hours are 7 a.m. to 8 p.m. Inpatients will be allowed two visitors daily. The visitors may change each day during the patient's stay. No visitors under the age of 68. Any visitor under the age of 15 must be accompanied by an adult. The visitor must pass COVID-19 screenings, use hand sanitizer when entering and exiting the patient's room and wear a mask at all times, including in the patient's room. Patients must also wear a mask when staff or their visitor are in the room. Masking is required regardless of vaccination status.

## 2020-09-26 ENCOUNTER — Encounter
Admission: RE | Admit: 2020-09-26 | Discharge: 2020-09-26 | Disposition: A | Discharge status: Home / Self Care | Payer: Medicare HMO | Source: Ambulatory Visit | Attending: General Surgery | Admitting: General Surgery

## 2020-09-26 ENCOUNTER — Other Ambulatory Visit: Payer: Self-pay

## 2020-09-26 DIAGNOSIS — I10 Essential (primary) hypertension: Secondary | ICD-10-CM | POA: Diagnosis not present

## 2020-09-26 DIAGNOSIS — Z01810 Encounter for preprocedural cardiovascular examination: Secondary | ICD-10-CM | POA: Diagnosis not present

## 2020-09-28 ENCOUNTER — Other Ambulatory Visit: Payer: Medicaid Other

## 2020-09-30 ENCOUNTER — Ambulatory Visit: Payer: Medicare HMO | Admitting: Certified Registered Nurse Anesthetist

## 2020-09-30 ENCOUNTER — Encounter: Payer: Self-pay | Admitting: General Surgery

## 2020-09-30 ENCOUNTER — Encounter
Admission: RE | Disposition: A | Discharge status: Home Health | Payer: Self-pay | Source: Home / Self Care | Attending: General Surgery

## 2020-09-30 ENCOUNTER — Ambulatory Visit
Admission: RE | Admit: 2020-09-30 | Discharge: 2020-09-30 | Disposition: A | Discharge status: Home Health | Payer: Medicare HMO | Attending: General Surgery | Admitting: General Surgery

## 2020-09-30 DIAGNOSIS — N401 Enlarged prostate with lower urinary tract symptoms: Secondary | ICD-10-CM | POA: Insufficient documentation

## 2020-09-30 DIAGNOSIS — M6208 Separation of muscle (nontraumatic), other site: Secondary | ICD-10-CM | POA: Diagnosis not present

## 2020-09-30 DIAGNOSIS — K439 Ventral hernia without obstruction or gangrene: Secondary | ICD-10-CM | POA: Diagnosis not present

## 2020-09-30 DIAGNOSIS — Z820 Family history of epilepsy and other diseases of the nervous system: Secondary | ICD-10-CM | POA: Diagnosis not present

## 2020-09-30 DIAGNOSIS — F0390 Unspecified dementia without behavioral disturbance: Secondary | ICD-10-CM | POA: Diagnosis not present

## 2020-09-30 DIAGNOSIS — Z833 Family history of diabetes mellitus: Secondary | ICD-10-CM | POA: Insufficient documentation

## 2020-09-30 DIAGNOSIS — R3912 Poor urinary stream: Secondary | ICD-10-CM | POA: Diagnosis not present

## 2020-09-30 DIAGNOSIS — K429 Umbilical hernia without obstruction or gangrene: Secondary | ICD-10-CM | POA: Diagnosis not present

## 2020-09-30 DIAGNOSIS — Z79899 Other long term (current) drug therapy: Secondary | ICD-10-CM | POA: Insufficient documentation

## 2020-09-30 DIAGNOSIS — Z8546 Personal history of malignant neoplasm of prostate: Secondary | ICD-10-CM | POA: Insufficient documentation

## 2020-09-30 DIAGNOSIS — Z8249 Family history of ischemic heart disease and other diseases of the circulatory system: Secondary | ICD-10-CM | POA: Insufficient documentation

## 2020-09-30 DIAGNOSIS — R339 Retention of urine, unspecified: Secondary | ICD-10-CM | POA: Insufficient documentation

## 2020-09-30 DIAGNOSIS — I10 Essential (primary) hypertension: Secondary | ICD-10-CM | POA: Diagnosis not present

## 2020-09-30 DIAGNOSIS — K436 Other and unspecified ventral hernia with obstruction, without gangrene: Secondary | ICD-10-CM | POA: Diagnosis not present

## 2020-09-30 HISTORY — PX: XI ROBOTIC ASSISTED VENTRAL HERNIA: SHX6789

## 2020-09-30 SURGERY — REPAIR, HERNIA, VENTRAL, ROBOT-ASSISTED
Anesthesia: General

## 2020-09-30 MED ORDER — BUPIVACAINE-EPINEPHRINE (PF) 0.25% -1:200000 IJ SOLN
INTRAMUSCULAR | Status: DC | PRN
  Administered 2020-09-30: 30 mL

## 2020-09-30 MED ORDER — CHLORHEXIDINE GLUCONATE 0.12 % MT SOLN
OROMUCOSAL | Status: AC
  Administered 2020-09-30: 15 mL via OROMUCOSAL
  Filled 2020-09-30: qty 15

## 2020-09-30 MED ORDER — FAMOTIDINE 20 MG PO TABS
ORAL_TABLET | ORAL | Status: AC
  Administered 2020-09-30: 20 mg via ORAL
  Filled 2020-09-30: qty 1

## 2020-09-30 MED ORDER — PHENYLEPHRINE HCL (PRESSORS) 10 MG/ML IV SOLN
INTRAVENOUS | Status: DC | PRN
  Administered 2020-09-30 (×2): 100 ug via INTRAVENOUS

## 2020-09-30 MED ORDER — EPHEDRINE 5 MG/ML INJ
INTRAVENOUS | Status: AC
  Filled 2020-09-30: qty 10

## 2020-09-30 MED ORDER — DEXMEDETOMIDINE (PRECEDEX) IN NS 20 MCG/5ML (4 MCG/ML) IV SYRINGE
PREFILLED_SYRINGE | INTRAVENOUS | Status: DC | PRN
Start: 2020-09-30 — End: 2020-09-30
  Administered 2020-09-30 (×2): 4 ug via INTRAVENOUS

## 2020-09-30 MED ORDER — PROPOFOL 10 MG/ML IV BOLUS
INTRAVENOUS | Status: AC
  Filled 2020-09-30: qty 20

## 2020-09-30 MED ORDER — MEPERIDINE HCL 50 MG/ML IJ SOLN: 6.2500 mg | INTRAMUSCULAR | Status: DC | PRN

## 2020-09-30 MED ORDER — SEVOFLURANE IN SOLN
RESPIRATORY_TRACT | Status: AC
  Filled 2020-09-30: qty 250

## 2020-09-30 MED ORDER — DEXAMETHASONE SODIUM PHOSPHATE 10 MG/ML IJ SOLN
INTRAMUSCULAR | Status: DC | PRN
  Administered 2020-09-30: 10 mg via INTRAVENOUS

## 2020-09-30 MED ORDER — PROPOFOL 10 MG/ML IV BOLUS
INTRAVENOUS | Status: DC | PRN
  Administered 2020-09-30: 120 mg via INTRAVENOUS

## 2020-09-30 MED ORDER — FAMOTIDINE 20 MG PO TABS: 20.0000 mg | ORAL_TABLET | Freq: Once | ORAL | Status: AC

## 2020-09-30 MED ORDER — OXYCODONE HCL 5 MG/5ML PO SOLN: 5.0000 mg | Freq: Once | ORAL | Status: DC | PRN

## 2020-09-30 MED ORDER — FENTANYL CITRATE (PF) 100 MCG/2ML IJ SOLN
INTRAMUSCULAR | Status: AC
  Filled 2020-09-30: qty 2

## 2020-09-30 MED ORDER — ONDANSETRON HCL 4 MG/2ML IJ SOLN
INTRAMUSCULAR | Status: AC
  Filled 2020-09-30: qty 2

## 2020-09-30 MED ORDER — PROMETHAZINE HCL 25 MG/ML IJ SOLN: 6.2500 mg | INTRAMUSCULAR | Status: DC | PRN

## 2020-09-30 MED ORDER — ONDANSETRON HCL 4 MG/2ML IJ SOLN
INTRAMUSCULAR | Status: DC | PRN
  Administered 2020-09-30: 4 mg via INTRAVENOUS

## 2020-09-30 MED ORDER — LIDOCAINE HCL (PF) 2 % IJ SOLN
INTRAMUSCULAR | Status: AC
  Filled 2020-09-30: qty 5

## 2020-09-30 MED ORDER — CEFAZOLIN SODIUM-DEXTROSE 2-4 GM/100ML-% IV SOLN
2.0000 g | INTRAVENOUS | Status: AC
  Administered 2020-09-30: 2 g via INTRAVENOUS

## 2020-09-30 MED ORDER — SODIUM CHLORIDE 0.9 % IV SOLN
INTRAVENOUS | Status: DC | PRN
  Administered 2020-09-30: 30 ug/min via INTRAVENOUS

## 2020-09-30 MED ORDER — OXYCODONE HCL 5 MG PO TABS
5.0000 mg | ORAL_TABLET | Freq: Once | ORAL | Status: DC | PRN
Start: 2020-09-30 — End: 2020-09-30

## 2020-09-30 MED ORDER — ACETAMINOPHEN 10 MG/ML IV SOLN
INTRAVENOUS | Status: DC | PRN
  Administered 2020-09-30: 1000 mg via INTRAVENOUS

## 2020-09-30 MED ORDER — ROCURONIUM BROMIDE 10 MG/ML (PF) SYRINGE
PREFILLED_SYRINGE | INTRAVENOUS | Status: AC
  Filled 2020-09-30: qty 10

## 2020-09-30 MED ORDER — DEXAMETHASONE SODIUM PHOSPHATE 10 MG/ML IJ SOLN
INTRAMUSCULAR | Status: AC
  Filled 2020-09-30: qty 1

## 2020-09-30 MED ORDER — LACTATED RINGERS IV SOLN: INTRAVENOUS | Status: DC

## 2020-09-30 MED ORDER — FENTANYL CITRATE (PF) 100 MCG/2ML IJ SOLN: 25.0000 ug | INTRAMUSCULAR | Status: DC | PRN

## 2020-09-30 MED ORDER — ORAL CARE MOUTH RINSE: 15.0000 mL | Freq: Once | OROMUCOSAL | Status: AC

## 2020-09-30 MED ORDER — FENTANYL CITRATE (PF) 100 MCG/2ML IJ SOLN
INTRAMUSCULAR | Status: DC | PRN
  Administered 2020-09-30 (×4): 50 ug via INTRAVENOUS

## 2020-09-30 MED ORDER — SUGAMMADEX SODIUM 200 MG/2ML IV SOLN
INTRAVENOUS | Status: DC | PRN
  Administered 2020-09-30: 200 mg via INTRAVENOUS

## 2020-09-30 MED ORDER — PHENYLEPHRINE HCL (PRESSORS) 10 MG/ML IV SOLN
INTRAVENOUS | Status: AC
  Filled 2020-09-30: qty 1

## 2020-09-30 MED ORDER — ACETAMINOPHEN 10 MG/ML IV SOLN
INTRAVENOUS | Status: AC
  Filled 2020-09-30: qty 100

## 2020-09-30 MED ORDER — LIDOCAINE HCL (CARDIAC) PF 100 MG/5ML IV SOSY
PREFILLED_SYRINGE | INTRAVENOUS | Status: DC | PRN
  Administered 2020-09-30: 100 mg via INTRAVENOUS

## 2020-09-30 MED ORDER — EPHEDRINE SULFATE 50 MG/ML IJ SOLN
INTRAMUSCULAR | Status: DC | PRN
  Administered 2020-09-30: 10 mg via INTRAVENOUS

## 2020-09-30 MED ORDER — HYDROCODONE-ACETAMINOPHEN 5-325 MG PO TABS: 1.0000 | ORAL_TABLET | ORAL | 0 refills | Status: AC | PRN

## 2020-09-30 MED ORDER — CHLORHEXIDINE GLUCONATE 0.12 % MT SOLN: 15.0000 mL | Freq: Once | OROMUCOSAL | Status: AC

## 2020-09-30 MED ORDER — ROCURONIUM BROMIDE 100 MG/10ML IV SOLN
INTRAVENOUS | Status: DC | PRN
  Administered 2020-09-30 (×2): 20 mg via INTRAVENOUS
  Administered 2020-09-30: 10 mg via INTRAVENOUS
  Administered 2020-09-30: 50 mg via INTRAVENOUS

## 2020-09-30 MED ORDER — GLYCOPYRROLATE 0.2 MG/ML IJ SOLN
INTRAMUSCULAR | Status: AC
  Filled 2020-09-30: qty 1

## 2020-09-30 MED ORDER — DEXMEDETOMIDINE (PRECEDEX) IN NS 20 MCG/5ML (4 MCG/ML) IV SYRINGE
PREFILLED_SYRINGE | INTRAVENOUS | Status: AC
  Filled 2020-09-30: qty 5

## 2020-09-30 MED ORDER — BUPIVACAINE-EPINEPHRINE (PF) 0.25% -1:200000 IJ SOLN
INTRAMUSCULAR | Status: AC
  Filled 2020-09-30: qty 30

## 2020-09-30 MED ORDER — CEFAZOLIN SODIUM-DEXTROSE 2-4 GM/100ML-% IV SOLN
INTRAVENOUS | Status: AC
  Filled 2020-09-30: qty 100

## 2020-09-30 SURGICAL SUPPLY — 50 items
ADH SKN CLS APL DERMABOND .7 (GAUZE/BANDAGES/DRESSINGS) ×1
APL PRP STRL LF DISP 70% ISPRP (MISCELLANEOUS) ×1
BINDER ABDOMINAL 12 ML 46-62 (SOFTGOODS) ×2 IMPLANT
BLADE SURG SZ11 CARB STEEL (BLADE) ×2 IMPLANT
CANISTER SUCT 1200ML W/VALVE (MISCELLANEOUS) IMPLANT
CHLORAPREP W/TINT 26 (MISCELLANEOUS) ×2 IMPLANT
COVER TIP SHEARS 8 DVNC (MISCELLANEOUS) ×1 IMPLANT
COVER TIP SHEARS 8MM DA VINCI (MISCELLANEOUS) ×1
COVER WAND RF STERILE (DRAPES) ×4 IMPLANT
DEFOGGER SCOPE WARMER CLEARIFY (MISCELLANEOUS) ×2 IMPLANT
DERMABOND ADVANCED (GAUZE/BANDAGES/DRESSINGS) ×1
DERMABOND ADVANCED .7 DNX12 (GAUZE/BANDAGES/DRESSINGS) ×1 IMPLANT
DRAPE ARM DVNC X/XI (DISPOSABLE) ×3 IMPLANT
DRAPE COLUMN DVNC XI (DISPOSABLE) ×1 IMPLANT
DRAPE DA VINCI XI ARM (DISPOSABLE) ×3
DRAPE DA VINCI XI COLUMN (DISPOSABLE) ×1
ELECT REM PT RETURN 9FT ADLT (ELECTROSURGICAL) ×2
ELECTRODE REM PT RTRN 9FT ADLT (ELECTROSURGICAL) ×1 IMPLANT
GLOVE SURG ENC MOIS LTX SZ6.5 (GLOVE) ×8 IMPLANT
GLOVE SURG UNDER POLY LF SZ6.5 (GLOVE) ×8 IMPLANT
GOWN STRL REUS W/ TWL LRG LVL3 (GOWN DISPOSABLE) ×4 IMPLANT
GOWN STRL REUS W/TWL LRG LVL3 (GOWN DISPOSABLE) ×8
GRASPER SUT TROCAR 14GX15 (MISCELLANEOUS) ×2 IMPLANT
IRRIGATOR SUCT 8 DISP DVNC XI (IRRIGATION / IRRIGATOR) IMPLANT
IRRIGATOR SUCTION 8MM XI DISP (IRRIGATION / IRRIGATOR)
IV NS 1000ML (IV SOLUTION)
IV NS 1000ML BAXH (IV SOLUTION) IMPLANT
KIT PINK PAD W/HEAD ARE REST (MISCELLANEOUS) ×2
KIT PINK PAD W/HEAD ARM REST (MISCELLANEOUS) ×1 IMPLANT
LABEL OR SOLS (LABEL) ×2 IMPLANT
MANIFOLD NEPTUNE II (INSTRUMENTS) ×2 IMPLANT
MESH VENT LT ST 11.4CM CRL (Mesh General) IMPLANT
MESH VENTRALIGHT ST 6X8 (Mesh Specialty) ×2 IMPLANT
MESH VENTRLGHT ELLIPSE 8X6XMFL (Mesh Specialty) ×1 IMPLANT
NEEDLE HYPO 22GX1.5 SAFETY (NEEDLE) ×2 IMPLANT
NEEDLE INSUFFLATION 14GA 120MM (NEEDLE) ×2 IMPLANT
OBTURATOR OPTICAL STANDARD 8MM (TROCAR) ×1
OBTURATOR OPTICAL STND 8 DVNC (TROCAR) ×1
OBTURATOR OPTICALSTD 8 DVNC (TROCAR) ×1 IMPLANT
PACK LAP CHOLECYSTECTOMY (MISCELLANEOUS) ×2 IMPLANT
SEAL CANN UNIV 5-8 DVNC XI (MISCELLANEOUS) ×3 IMPLANT
SEAL XI 5MM-8MM UNIVERSAL (MISCELLANEOUS) ×3
SET TUBE SMOKE EVAC HIGH FLOW (TUBING) ×2 IMPLANT
SOLUTION ELECTROLUBE (MISCELLANEOUS) ×2 IMPLANT
SUT MNCRL AB 4-0 PS2 18 (SUTURE) ×2 IMPLANT
SUT STRATAFIX PDS 30 CT-1 (SUTURE) ×8 IMPLANT
SUT VLOC 90 2/L VL 12 GS22 (SUTURE) ×6 IMPLANT
SUT VLOC 90 S/L VL9 GS22 (SUTURE) ×6 IMPLANT
TAPE TRANSPORE STRL 2 31045 (GAUZE/BANDAGES/DRESSINGS) ×2 IMPLANT
TRAY FOLEY MTR SLVR 16FR STAT (SET/KITS/TRAYS/PACK) IMPLANT

## 2020-09-30 NOTE — Anesthesia Postprocedure Evaluation (Signed)
Anesthesia Post Note  Patient: Daniel Knapp  Procedure(s) Performed: XI ROBOTIC ASSISTED VENTRAL HERNIA (N/A )  Patient location during evaluation: PACU Anesthesia Type: General Level of consciousness: awake and alert Pain management: pain level controlled Vital Signs Assessment: post-procedure vital signs reviewed and stable Respiratory status: spontaneous breathing, nonlabored ventilation and respiratory function stable Cardiovascular status: blood pressure returned to baseline and stable Postop Assessment: no signs of nausea or vomiting Anesthetic complications: no   No complications documented.   Last Vitals:  Vitals:   09/30/20 1113 09/30/20 1141  BP: (!) 145/83 140/74  Pulse: 79 84  Resp: 18 18  Temp: (!) 36.3 C   SpO2: 100% 100%    Last Pain:  Vitals:   09/30/20 1141  TempSrc:   PainSc: 0-No pain                 Kayly Kriegel

## 2020-09-30 NOTE — H&P (Signed)
PATIENT PROFILE: Daniel Knapp is a 70 y.o. male who presents to the OR for surgical management of epigastric and umbilical hernia  PCP: Baldemar Lenis, Jacqulyn Liner, MD  HISTORY OF PRESENT ILLNESS: Mr. Ruhe reports having an umbilical hernia and ventral hernia for years. The epigastric hernia has been more symptomatic recently. The sister reports that the pain has been getting more intense. He even bend over with pain recently. Pain localized to area of epigastric and umbilical hernia. Pain does not radiates to other part of the body. There has been no alleviating factors. Pain aggravate when hernia content are out. Denies abdominal distention, nausea or vomiting.   PROBLEM LIST: Problem List Date Reviewed: 04/29/2020  Noted  Dementia without behavioral disturbance (CMS-HCC) 06/30/2019  Elevated blood sugar level, unspecified 06/30/2019  Benign prostatic hyperplasia with weak urinary stream 03/11/2018  Prostate cancer (CMS-HCC) 03/11/2018  Essential hypertension, benign 03/11/2018  BPH (benign prostatic hyperplasia) 08/17/2015  Urinary retention 11/10/2014  Overview  Formatting of this note might be different from the original. Patient currently has an indwelling Foley. We discussed CIC but patient is not interested in doing to the type of work he engages in.     GENERAL REVIEW OF SYSTEMS:   General ROS: negative for - chills, fatigue, fever, weight gain or weight loss Allergy and Immunology ROS: negative for - hives  Hematological and Lymphatic ROS: negative for - bleeding problems or bruising, negative for palpable nodes Endocrine ROS: negative for - heat or cold intolerance, hair changes Respiratory ROS: negative for - cough, shortness of breath or wheezing Cardiovascular ROS: no chest pain or palpitations GI ROS: negative for nausea, vomiting, diarrhea, constipation. Positive for abdominal pain.  Musculoskeletal ROS: negative for - joint swelling or muscle pain Neurological ROS:  negative for - confusion, syncope Dermatological ROS: negative for pruritus and rash Psychiatric: negative for anxiety, depression, difficulty sleeping. Positive for memory loss  MEDICATIONS: Current Outpatient Medications  Medication Sig Dispense Refill  . amLODIPine (NORVASC) 10 MG tablet TAKE 1 TABLET BY MOUTH EVERY DAY 90 tablet 3  . cyanocobalamin (VITAMIN B12) 1000 MCG tablet Take 1,000 mcg by mouth once daily  . divalproex (DEPAKOTE ER) 250 MG ER tablet Take 1 tablet (250 mg total) by mouth once daily 30 tablet 5  . ferrous sulfate 325 (65 FE) MG tablet Take 1 tablet (325 mg total) by mouth daily with breakfast 30 tablet 11  . finasteride (PROSCAR) 5 mg tablet TAKE 1 TABLET BY MOUTH EVERY DAY 90 tablet 3  . memantine (NAMENDA) 5 MG tablet TAKE 1 TABLET BY MOUTH TWICE A DAY 180 tablet 1  . multivitamin tablet Take 1 tablet by mouth once daily  . QUEtiapine (SEROQUEL) 25 MG tablet Take 1 tablet (25 mg total) by mouth nightly for 30 days 30 tablet 0  . tamsulosin (FLOMAX) 0.4 mg capsule TAKE 1 CAPSULE (0.4 MG TOTAL) BY MOUTH ONCE DAILY TAKE 30 MINUTES AFTER SAME MEAL EACH DAY. 90 capsule 3  . donepeziL (ARICEPT) 10 MG tablet Take 1 tablet (10 mg total) by mouth nightly for 90 days 90 tablet 3   No current facility-administered medications for this visit.   ALLERGIES: Patient has no known allergies.  PAST MEDICAL HISTORY: Past Medical History:  Diagnosis Date  . BPH (benign prostatic hyperplasia)  . Hypertension  . Prostate cancer (CMS-HCC)   PAST SURGICAL HISTORY: Past Surgical History:  Procedure Laterality Date  . RESECTION TRANSURETHRAL PROSTATE 2017    FAMILY HISTORY: Family History  Problem Relation  Age of Onset  . Diabetes type II Mother  . Heart disease Mother  . Alzheimer's disease Mother  . Dementia Father  . Alzheimer's disease Father  . Liver disease Brother  . Diabetes type II Sister  . High blood pressure (Hypertension) Sister  . High blood pressure  (Hypertension) Brother    SOCIAL HISTORY: Social History   Socioeconomic History  . Marital status: Single  Spouse name: Not on file  . Number of children: Not on file  . Years of education: Not on file  . Highest education level: Not on file  Occupational History  . Not on file  Tobacco Use  . Smoking status: Never Smoker  . Smokeless tobacco: Never Used  Substance and Sexual Activity  . Alcohol use: Not Currently  . Drug use: Not Currently  . Sexual activity: Not Currently  Other Topics Concern  . Not on file  Social History Narrative  . Not on file   Social Determinants of Health   Financial Resource Strain: Not on file  Food Insecurity: Not on file  Transportation Needs: Not on file   PHYSICAL EXAM: Vitals:  08/04/20 1513  BP: (!) 142/75  Pulse: 65   Body mass index is 27.73 kg/m. Weight: 98 kg (216 lb)   GENERAL: Alert, active, oriented x3  HEENT: Pupils equal reactive to light. Extraocular movements are intact. Sclera clear. Palpebral conjunctiva normal red color.Pharynx clear.  NECK: Supple with no palpable mass and no adenopathy.  LUNGS: Sound clear with no rales rhonchi or wheezes.  HEART: Regular rhythm S1 and S2 without murmur.  ABDOMEN: Soft and depressible, nontender with no palpable mass, no hepatomegaly. There is an epigastric hernia and umbilical hernia, both reducible.   EXTREMITIES: Well-developed well-nourished symmetrical with no dependent edema.  NEUROLOGICAL: Awake alert oriented, facial expression symmetrical, moving all extremities.  REVIEW OF DATA: I have reviewed the following data today: Initial consult on 08/04/2020  Component Date Value  . Glucose 08/04/2020 100  . Sodium 08/04/2020 140  . Potassium 08/04/2020 4.0  . Chloride 08/04/2020 103  . Carbon Dioxide (CO2) 08/04/2020 31.9  . Urea Nitrogen (BUN) 08/04/2020 14  . Creatinine 08/04/2020 1.0  . Glomerular Filtration Ra* 08/04/2020 90  . Calcium 08/04/2020 9.8  .  AST 08/04/2020 31  . ALT 08/04/2020 29  . Alk Phos (alkaline Phosp* 08/04/2020 76  . Albumin 08/04/2020 4.5  . Bilirubin, Total 08/04/2020 0.4  . Protein, Total 08/04/2020 8.2 (!)  . A/G Ratio 08/04/2020 1.2  . WBC (White Blood Cell Co* 08/04/2020 4.2  . RBC (Red Blood Cell Coun* 08/04/2020 4.41 (!)  . Hemoglobin 08/04/2020 13.0 (!)  . Hematocrit 08/04/2020 40.6  . MCV (Mean Corpuscular Vo* 08/04/2020 92.1  . MCH (Mean Corpuscular He* 08/04/2020 29.5  . MCHC (Mean Corpuscular H* 08/04/2020 32.0  . Platelet Count 08/04/2020 148 (!)  . RDW-CV (Red Cell Distrib* 08/04/2020 13.9  . MPV (Mean Platelet Volum* 08/04/2020 11.5  . Neutrophils 08/04/2020 2.17  . Lymphocytes 08/04/2020 1.50  . Monocytes 08/04/2020 0.35  . Eosinophils 08/04/2020 0.18  . Basophils 08/04/2020 0.03  . Neutrophil % 08/04/2020 51.2  . Lymphocyte % 08/04/2020 35.5  . Monocyte % 08/04/2020 8.3  . Eosinophil % 08/04/2020 4.3  . Basophil% 08/04/2020 0.7  . Immature Granulocyte % 08/04/2020 0.0  . Immature Granulocyte Cou* 08/04/2020 0.00    ASSESSMENT: Mr. Servellon is a 70 y.o. male presenting for surgical management for epigastric and umbilical hernia.  Patient with two moderate size  ventral hernias (umbilical and epigastric). The patient and the sister were oriented about what is a ventral hernia and its implication. There has been no change on history or physical exam since last evaluation. Patient cleared for surgery from Dr. Baldemar Lenis (patient's primary care provider) standpoint.   Ventral hernia without obstruction or gangrene [K43.9]  PLAN: 1. Robotic assisted laparoscopic ventral hernia repair ([id number])  Patient and her sister verbalized understanding, all questions were answered, and were agreeable with the plan outlined above.   Herbert Pun, MD

## 2020-09-30 NOTE — Transfer of Care (Signed)
Immediate Anesthesia Transfer of Care Note  Patient: Daniel Knapp  Procedure(s) Performed: XI ROBOTIC ASSISTED VENTRAL HERNIA (N/A )  Patient Location: PACU  Anesthesia Type:General  Level of Consciousness: drowsy  Airway & Oxygen Therapy: Patient Spontanous Breathing and Patient connected to face mask oxygen  Post-op Assessment: Report given to RN and Post -op Vital signs reviewed and stable  Post vital signs: Reviewed and stable  Last Vitals:  Vitals Value Taken Time  BP 126/80 09/30/20 0953  Temp    Pulse 75 09/30/20 0956  Resp 12 09/30/20 0956  SpO2 100 % 09/30/20 0956  Vitals shown include unvalidated device data.  Last Pain:  Vitals:   09/30/20 0620  TempSrc: Tympanic  PainSc: 0-No pain         Complications: No complications documented.

## 2020-09-30 NOTE — Anesthesia Preprocedure Evaluation (Signed)
Anesthesia Evaluation  Patient identified by MRN, date of birth, ID band Patient awake    Reviewed: Allergy & Precautions, NPO status , Patient's Chart, lab work & pertinent test results  History of Anesthesia Complications Negative for: history of anesthetic complications  Airway Mallampati: II  TM Distance: >3 FB Neck ROM: Full    Dental  (+) Edentulous Upper, Poor Dentition, Missing   Pulmonary neg sleep apnea, neg COPD,    breath sounds clear to auscultation- rhonchi (-) wheezing      Cardiovascular hypertension, Pt. on medications (-) CAD, (-) Past MI, (-) Cardiac Stents and (-) CABG  Rhythm:Regular Rate:Normal - Systolic murmurs and - Diastolic murmurs    Neuro/Psych neg Seizures PSYCHIATRIC DISORDERS Dementia negative neurological ROS     GI/Hepatic negative GI ROS, Neg liver ROS,   Endo/Other  negative endocrine ROSneg diabetes  Renal/GU negative Renal ROS     Musculoskeletal negative musculoskeletal ROS (+)   Abdominal (+) - obese,   Peds  Hematology  (+) anemia ,   Anesthesia Other Findings Past Medical History: No date: Acute cystitis with hematuria No date: Anemia No date: Dementia (HCC) No date: Hepatitis     Comment:  C+ -questionable in pcp note No date: HTN (hypertension) No date: Positive RPR test     Comment:  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-has seen Dr Delaine Lame 02/2016: Prostate cancer ([facility]) No date: Prostate enlargement No date: Urinary catheter insertion/adjustment/removal No date: Urinary retention No date: UTI (lower urinary tract infection) No date: UTI symptoms No date: Weight loss   Reproductive/Obstetrics                             Anesthesia Physical Anesthesia Plan  ASA: II  Anesthesia Plan: General   Post-op Pain Management:    Induction: Intravenous  PONV Risk Score and Plan: 1 and Ondansetron and Dexamethasone  Airway Management  Planned: Oral ETT  Additional Equipment:   Intra-op Plan:   Post-operative Plan: Extubation in OR  Informed Consent: I have reviewed the patients History and Physical, chart, labs and discussed the procedure including the risks, benefits and alternatives for the proposed anesthesia with the patient or authorized representative who has indicated his/her understanding and acceptance.     Dental advisory given  Plan Discussed with: CRNA and Anesthesiologist  Anesthesia Plan Comments:         Anesthesia Quick Evaluation

## 2020-09-30 NOTE — Anesthesia Procedure Notes (Signed)
Procedure Name: Intubation Date/Time: 09/30/2020 7:36 AM Performed by: Lily Peer, Dhaval Woo, CRNA Pre-anesthesia Checklist: Patient identified, Emergency Drugs available, Suction available and Patient being monitored Patient Re-evaluated:Patient Re-evaluated prior to induction Oxygen Delivery Method: Circle system utilized Preoxygenation: Pre-oxygenation with 100% oxygen Induction Type: IV induction Ventilation: Mask ventilation without difficulty and Oral airway inserted - appropriate to patient size Laryngoscope Size: McGraph and 4 Grade View: Grade I Tube type: Oral Tube size: 7.5 mm Number of attempts: 1 Airway Equipment and Method: Stylet and Oral airway Placement Confirmation: ETT inserted through vocal cords under direct vision,  positive ETCO2 and breath sounds checked- equal and bilateral Secured at: 24 cm Tube secured with: Tape Dental Injury: Teeth and Oropharynx as per pre-operative assessment

## 2020-09-30 NOTE — Op Note (Addendum)
Preoperative diagnosis: Umbilical and Ventral Hernia  Postoperative diagnosis: Umbilical and Ventral Hernia                                              Diastasis recti  Procedure: Robotic assisted laparoscopic umbilical and ventral hernia repair with mesh Plication of the diastasis recti  Anesthesia: General  Surgeon: Dr. Windell Moment  Wound Classification: Clean  Specimen: None  Complications: None  Estimated Blood Loss: 10ml  Indications: A 70 year old male with symptomatic ventral and umbilical hernia.  Findings: 1.  Umbilical and Ventral Hernia both with incarcerated fat content   2. Tension free repair achieved with 15 x 20 cm bard mesh and suture   3. Adequate hemostasis  Description of procedure: The patient was brought to the operating room and general anesthesia was induced. A time-out was completed verifying correct patient, procedure, site, positioning, and implant(s) and/or special equipment prior to beginning this procedure. Antibiotics were administered prior to making the incision. SCDs placed. The anterior abdominal wall was prepped and draped in the standard sterile fashion.   Palmer's point chosen for entry.  Veress needle placed and abdomen insufflated to 15cm without any dramatic increase in pressure.  Needle removed and optiview technique used to place 41mm port at same point.  No injury noted during placement. Two additional ports, one 73mm and one 12 mm along left lateral aspect placed.  Xi robot then docked into place.  Incarcerated hernia contents noted and reduced with combination of blunt, sharp dissection with scissors and fenestrated forceps.  Hemostasis achieved throughout this portion.  Once all hernia contents reduced, there was noted to be a a 2 cm umbilical hernia and a 1 cm ventral hernia with 10 cm apart.    Insufflation dropped to 51mm and transfacial suture with 0 stratafix used to primarily close defect under minimal tension. The diastasis  recti was plicated to decrease tension even more.  This was necessary due to the fact that patient had multiple hernias in the midline.  Reducing the tension from the diastases recti decrease the risk of recurrence.  Additional STRATAFIX suture was needed to complete the plication of the fascia.  This added 30 minutes to the procedure due to its complexity.  Bard protected 20 cm x 15 cm mesh was placed within the abdominal cavity through 83mm port and secured to the abdominal wall centered over the defect using the 0 stratafix previously used to primarily close defect.  The mesh was then circumferentially sutured into the anterior abdominal wall using 2-0 VLock x4.  Any bleeding noted during this portion was no longer actively bleeding by end of securing mesh and tightening the suture.  Needles removed.   Robot was undocked.  The 1mm cannula was removed and port site was closed using PMI device and 0 vicryl suture, ensuring no bowels were injured during this process.  Abdomen then desufflated while camera within abdomen to ensure no signs of new bleed prior to removing camera and rest of ports completely.  12 mm port site skin closed using 3-0 Vicryl for the deep dermal layer in an interrupted fashion and  All skin incisions closed with runninrg 4-0 Monocryl in a subcuticular fashion.  All wounds then dressed with Dermabond.  Patient was then successfully awakened and transferred to PACU in stable condition.  At the end of the procedure sponge and instrument  counts were correct.

## 2020-09-30 NOTE — Discharge Instructions (Addendum)
  Diet: Resume home heart healthy regular diet.   Activity: No heavy lifting >20 pounds (children, pets, laundry, garbage) or strenuous activity until follow-up, but light activity and walking are encouraged. Do not drive or drink alcohol if taking narcotic pain medications.  Wound care: May shower with soapy water and pat dry (do not rub incisions), but no baths or submerging incision underwater until follow-up. (no swimming)   Medications: Resume all home medications. For mild to moderate pain: acetaminophen (Tylenol) or ibuprofen (if no kidney disease). Combining Tylenol with alcohol can substantially increase your risk of causing liver disease. Narcotic pain medications, if prescribed, can be used for severe pain, though may cause nausea, constipation, and drowsiness. Do not combine Tylenol and Norco within a 6 hour period as Norco contains Tylenol. If you do not need the narcotic pain medication, you do not need to fill the prescription.  Call office [phone]) at any time if any questions, worsening pain, fevers/chills, bleeding, drainage from incision site, or other concerns.  TYLENOL 1,000 MG GIVEN AT [place] AT 9:28 AM.    AMBULATORY SURGERY  DISCHARGE INSTRUCTIONS   1) The drugs that you were given will stay in your system until tomorrow so for the next 24 hours you should not:  A) Drive an automobile B) Make any legal decisions C) Drink any alcoholic beverage   2) You may resume regular meals tomorrow.  Today it is better to start with liquids and gradually work up to solid foods.  You may eat anything you prefer, but it is better to start with liquids, then soup and crackers, and gradually work up to solid foods.   3) Please notify your doctor immediately if you have any unusual bleeding, trouble breathing, redness and pain at the surgery site, drainage, fever, or pain not relieved by medication.    4) Additional Instructions:        Please contact your  physician with any problems or Same Day Surgery at [phone], Monday through Friday 6 am to 4 pm, or [facility] number at [phone].

## 2020-10-03 NOTE — Progress Notes (Signed)
Spoke with his sister.

## 2021-01-03 DIAGNOSIS — Z79899 Other long term (current) drug therapy: Secondary | ICD-10-CM | POA: Diagnosis not present

## 2021-01-03 DIAGNOSIS — Z1331 Encounter for screening for depression: Secondary | ICD-10-CM | POA: Diagnosis not present

## 2021-01-03 DIAGNOSIS — Z125 Encounter for screening for malignant neoplasm of prostate: Secondary | ICD-10-CM | POA: Diagnosis not present

## 2021-01-03 DIAGNOSIS — R739 Hyperglycemia, unspecified: Secondary | ICD-10-CM | POA: Diagnosis not present

## 2021-01-03 DIAGNOSIS — Z0000 Encounter for general adult medical examination without abnormal findings: Secondary | ICD-10-CM | POA: Diagnosis not present

## 2021-01-03 DIAGNOSIS — F0390 Unspecified dementia without behavioral disturbance: Secondary | ICD-10-CM | POA: Diagnosis not present

## 2021-01-03 DIAGNOSIS — I10 Essential (primary) hypertension: Secondary | ICD-10-CM | POA: Diagnosis not present

## 2021-04-24 DIAGNOSIS — G301 Alzheimer's disease with late onset: Secondary | ICD-10-CM | POA: Diagnosis not present

## 2021-04-24 DIAGNOSIS — F0280 Dementia in other diseases classified elsewhere without behavioral disturbance: Secondary | ICD-10-CM | POA: Diagnosis not present

## 2021-04-24 DIAGNOSIS — R634 Abnormal weight loss: Secondary | ICD-10-CM | POA: Diagnosis not present

## 2021-07-03 DIAGNOSIS — Z79899 Other long term (current) drug therapy: Secondary | ICD-10-CM | POA: Diagnosis not present

## 2021-07-03 DIAGNOSIS — I10 Essential (primary) hypertension: Secondary | ICD-10-CM | POA: Diagnosis not present

## 2021-07-03 DIAGNOSIS — R739 Hyperglycemia, unspecified: Secondary | ICD-10-CM | POA: Diagnosis not present

## 2021-07-10 DIAGNOSIS — F03918 Unspecified dementia, unspecified severity, with other behavioral disturbance: Secondary | ICD-10-CM | POA: Diagnosis not present

## 2021-07-10 DIAGNOSIS — I10 Essential (primary) hypertension: Secondary | ICD-10-CM | POA: Diagnosis not present

## 2021-07-10 DIAGNOSIS — Z79899 Other long term (current) drug therapy: Secondary | ICD-10-CM | POA: Diagnosis not present

## 2021-07-10 DIAGNOSIS — Z125 Encounter for screening for malignant neoplasm of prostate: Secondary | ICD-10-CM | POA: Diagnosis not present

## 2021-07-10 DIAGNOSIS — R739 Hyperglycemia, unspecified: Secondary | ICD-10-CM | POA: Diagnosis not present

## 2021-10-20 DIAGNOSIS — C61 Malignant neoplasm of prostate: Secondary | ICD-10-CM | POA: Diagnosis not present

## 2021-10-20 DIAGNOSIS — R634 Abnormal weight loss: Secondary | ICD-10-CM | POA: Diagnosis not present

## 2021-10-20 DIAGNOSIS — F0280 Dementia in other diseases classified elsewhere without behavioral disturbance: Secondary | ICD-10-CM | POA: Diagnosis not present

## 2021-10-20 DIAGNOSIS — G301 Alzheimer's disease with late onset: Secondary | ICD-10-CM | POA: Diagnosis not present

## 2021-10-20 DIAGNOSIS — K921 Melena: Secondary | ICD-10-CM | POA: Diagnosis not present

## 2021-10-23 ENCOUNTER — Other Ambulatory Visit: Payer: Self-pay | Admitting: Student

## 2021-10-23 ENCOUNTER — Other Ambulatory Visit: Payer: Self-pay | Admitting: Neurology

## 2021-10-23 DIAGNOSIS — R1310 Dysphagia, unspecified: Secondary | ICD-10-CM

## 2021-10-23 DIAGNOSIS — F02818 Dementia in other diseases classified elsewhere, unspecified severity, with other behavioral disturbance: Secondary | ICD-10-CM

## 2021-10-23 DIAGNOSIS — R55 Syncope and collapse: Secondary | ICD-10-CM

## 2021-10-23 DIAGNOSIS — F0280 Dementia in other diseases classified elsewhere without behavioral disturbance: Secondary | ICD-10-CM

## 2021-10-25 ENCOUNTER — Encounter: Payer: Self-pay | Admitting: *Deleted

## 2021-11-09 ENCOUNTER — Ambulatory Visit: Admission: RE | Admit: 2021-11-09 | Payer: Medicaid Other | Source: Ambulatory Visit

## 2021-11-20 ENCOUNTER — Other Ambulatory Visit: Payer: Self-pay

## 2021-11-20 ENCOUNTER — Inpatient Hospital Stay: Payer: Medicare HMO

## 2021-11-20 ENCOUNTER — Emergency Department: Payer: Medicare HMO

## 2021-11-20 ENCOUNTER — Inpatient Hospital Stay
Admission: EM | Admit: 2021-11-20 | Discharge: 2021-11-23 | DRG: 300 | Disposition: A | Discharge status: Other Acute Inpatient Hospital | Payer: Medicare HMO | Attending: Internal Medicine | Admitting: Internal Medicine

## 2021-11-20 DIAGNOSIS — Z515 Encounter for palliative care: Secondary | ICD-10-CM | POA: Diagnosis not present

## 2021-11-20 DIAGNOSIS — K759 Inflammatory liver disease, unspecified: Secondary | ICD-10-CM | POA: Diagnosis present

## 2021-11-20 DIAGNOSIS — D509 Iron deficiency anemia, unspecified: Secondary | ICD-10-CM | POA: Diagnosis present

## 2021-11-20 DIAGNOSIS — F0390 Unspecified dementia without behavioral disturbance: Secondary | ICD-10-CM | POA: Diagnosis present

## 2021-11-20 DIAGNOSIS — I864 Gastric varices: Secondary | ICD-10-CM | POA: Diagnosis not present

## 2021-11-20 DIAGNOSIS — K7460 Unspecified cirrhosis of liver: Secondary | ICD-10-CM | POA: Diagnosis not present

## 2021-11-20 DIAGNOSIS — D61818 Other pancytopenia: Secondary | ICD-10-CM | POA: Diagnosis present

## 2021-11-20 DIAGNOSIS — Z79899 Other long term (current) drug therapy: Secondary | ICD-10-CM | POA: Diagnosis not present

## 2021-11-20 DIAGNOSIS — G9340 Encephalopathy, unspecified: Secondary | ICD-10-CM | POA: Diagnosis present

## 2021-11-20 DIAGNOSIS — R933 Abnormal findings on diagnostic imaging of other parts of digestive tract: Secondary | ICD-10-CM | POA: Diagnosis not present

## 2021-11-20 DIAGNOSIS — F1011 Alcohol abuse, in remission: Secondary | ICD-10-CM | POA: Diagnosis present

## 2021-11-20 DIAGNOSIS — E876 Hypokalemia: Secondary | ICD-10-CM | POA: Diagnosis present

## 2021-11-20 DIAGNOSIS — K7469 Other cirrhosis of liver: Secondary | ICD-10-CM | POA: Diagnosis not present

## 2021-11-20 DIAGNOSIS — Z8249 Family history of ischemic heart disease and other diseases of the circulatory system: Secondary | ICD-10-CM | POA: Diagnosis not present

## 2021-11-20 DIAGNOSIS — Z809 Family history of malignant neoplasm, unspecified: Secondary | ICD-10-CM

## 2021-11-20 DIAGNOSIS — B182 Chronic viral hepatitis C: Secondary | ICD-10-CM | POA: Diagnosis present

## 2021-11-20 DIAGNOSIS — I10 Essential (primary) hypertension: Secondary | ICD-10-CM | POA: Diagnosis not present

## 2021-11-20 DIAGNOSIS — K922 Gastrointestinal hemorrhage, unspecified: Secondary | ICD-10-CM

## 2021-11-20 DIAGNOSIS — F01511 Vascular dementia, unspecified severity, with agitation: Secondary | ICD-10-CM | POA: Diagnosis present

## 2021-11-20 DIAGNOSIS — Z923 Personal history of irradiation: Secondary | ICD-10-CM

## 2021-11-20 DIAGNOSIS — R9431 Abnormal electrocardiogram [ECG] [EKG]: Secondary | ICD-10-CM | POA: Diagnosis not present

## 2021-11-20 DIAGNOSIS — K920 Hematemesis: Secondary | ICD-10-CM | POA: Diagnosis present

## 2021-11-20 DIAGNOSIS — D62 Acute posthemorrhagic anemia: Secondary | ICD-10-CM | POA: Diagnosis not present

## 2021-11-20 DIAGNOSIS — D649 Anemia, unspecified: Secondary | ICD-10-CM | POA: Diagnosis not present

## 2021-11-20 DIAGNOSIS — F02811 Dementia in other diseases classified elsewhere, unspecified severity, with agitation: Secondary | ICD-10-CM | POA: Diagnosis present

## 2021-11-20 DIAGNOSIS — G309 Alzheimer's disease, unspecified: Secondary | ICD-10-CM | POA: Diagnosis not present

## 2021-11-20 DIAGNOSIS — F02818 Dementia in other diseases classified elsewhere, unspecified severity, with other behavioral disturbance: Secondary | ICD-10-CM | POA: Diagnosis not present

## 2021-11-20 DIAGNOSIS — R000 Tachycardia, unspecified: Secondary | ICD-10-CM | POA: Diagnosis not present

## 2021-11-20 DIAGNOSIS — Z8546 Personal history of malignant neoplasm of prostate: Secondary | ICD-10-CM | POA: Diagnosis not present

## 2021-11-20 DIAGNOSIS — W19XXXA Unspecified fall, initial encounter: Secondary | ICD-10-CM | POA: Diagnosis present

## 2021-11-20 DIAGNOSIS — S199XXA Unspecified injury of neck, initial encounter: Secondary | ICD-10-CM | POA: Diagnosis not present

## 2021-11-20 DIAGNOSIS — R188 Other ascites: Secondary | ICD-10-CM

## 2021-11-20 DIAGNOSIS — M2578 Osteophyte, vertebrae: Secondary | ICD-10-CM | POA: Diagnosis not present

## 2021-11-20 DIAGNOSIS — K921 Melena: Secondary | ICD-10-CM | POA: Diagnosis not present

## 2021-11-20 DIAGNOSIS — J9811 Atelectasis: Secondary | ICD-10-CM | POA: Diagnosis not present

## 2021-11-20 DIAGNOSIS — R4182 Altered mental status, unspecified: Secondary | ICD-10-CM | POA: Diagnosis not present

## 2021-11-20 DIAGNOSIS — Z789 Other specified health status: Secondary | ICD-10-CM | POA: Diagnosis not present

## 2021-11-20 DIAGNOSIS — R948 Abnormal results of function studies of other organs and systems: Secondary | ICD-10-CM | POA: Diagnosis not present

## 2021-11-20 DIAGNOSIS — R0902 Hypoxemia: Secondary | ICD-10-CM | POA: Diagnosis not present

## 2021-11-20 DIAGNOSIS — R404 Transient alteration of awareness: Secondary | ICD-10-CM | POA: Diagnosis not present

## 2021-11-20 DIAGNOSIS — N400 Enlarged prostate without lower urinary tract symptoms: Secondary | ICD-10-CM | POA: Diagnosis present

## 2021-11-20 LAB — COMPREHENSIVE METABOLIC PANEL
ALT: 13 U/L (ref 0–44)
AST: 22 U/L (ref 15–41)
Albumin: 3.1 g/dL — ABNORMAL LOW (ref 3.5–5.0)
Alkaline Phosphatase: 43 U/L (ref 38–126)
Anion gap: 7 (ref 5–15)
BUN: 25 mg/dL — ABNORMAL HIGH (ref 8–23)
CO2: 22 mmol/L (ref 22–32)
Calcium: 8.3 mg/dL — ABNORMAL LOW (ref 8.9–10.3)
Chloride: 116 mmol/L — ABNORMAL HIGH (ref 98–111)
Creatinine, Ser: 0.94 mg/dL (ref 0.61–1.24)
GFR, Estimated: >60 mL/min (ref >60)
Glucose, Bld: 113 mg/dL — ABNORMAL HIGH (ref 70–99)
Potassium: 3.2 mmol/L — ABNORMAL LOW (ref 3.5–5.1)
Sodium: 145 mmol/L (ref 135–145)
Total Bilirubin: 0.5 mg/dL (ref 0.3–1.2)
Total Protein: 6.8 g/dL (ref 6.5–8.1)

## 2021-11-20 LAB — AMMONIA: Ammonia: <10 umol/L (ref 9–35)

## 2021-11-20 LAB — URINALYSIS, COMPLETE (UACMP) WITH MICROSCOPIC
Bacteria, UA: NONE SEEN
Bilirubin Urine: NEGATIVE
Glucose, UA: NEGATIVE mg/dL
Hgb urine dipstick: NEGATIVE
Ketones, ur: NEGATIVE mg/dL
Leukocytes,Ua: NEGATIVE
Nitrite: NEGATIVE
Protein, ur: NEGATIVE mg/dL
Specific Gravity, Urine: 1.014 (ref 1.005–1.030)
Squamous Epithelial / HPF: NONE SEEN (ref 0–5)
pH: 6 (ref 5.0–8.0)

## 2021-11-20 LAB — PROTIME-INR
INR: 1.4 — ABNORMAL HIGH (ref 0.8–1.2)
Prothrombin Time: 17 seconds — ABNORMAL HIGH (ref 11.4–15.2)

## 2021-11-20 LAB — PREPARE RBC (CROSSMATCH)

## 2021-11-20 LAB — FERRITIN: Ferritin: 4 ng/mL — ABNORMAL LOW (ref 24–336)

## 2021-11-20 LAB — CBC
HCT: 13.4 % — CL (ref 39.0–52.0)
Hemoglobin: 3.7 g/dL — CL (ref 13.0–17.0)
MCH: 22.4 pg — ABNORMAL LOW (ref 26.0–34.0)
MCHC: 27.6 g/dL — ABNORMAL LOW (ref 30.0–36.0)
MCV: 81.2 fL (ref 80.0–100.0)
Platelets: 131 10*3/uL — ABNORMAL LOW (ref 150–400)
RBC: 1.65 MIL/uL — ABNORMAL LOW (ref 4.22–5.81)
RDW: 18.5 % — ABNORMAL HIGH (ref 11.5–15.5)
WBC: 9.4 10*3/uL (ref 4.0–10.5)
nRBC: 0.2 % (ref 0.0–0.2)

## 2021-11-20 LAB — IRON AND TIBC
Iron: 9 ug/dL — ABNORMAL LOW (ref 45–182)
Saturation Ratios: 2 % — ABNORMAL LOW (ref 17.9–39.5)
TIBC: 469 ug/dL — ABNORMAL HIGH (ref 250–450)
UIBC: 460 ug/dL

## 2021-11-20 LAB — APTT: aPTT: 32 seconds (ref 24–36)

## 2021-11-20 LAB — MAGNESIUM: Magnesium: 1.9 mg/dL (ref 1.7–2.4)

## 2021-11-20 LAB — FOLATE: Folate: 9.5 ng/mL (ref >5.9)

## 2021-11-20 LAB — ABO/RH: ABO/RH(D): O POS

## 2021-11-20 LAB — HEMOGLOBIN AND HEMATOCRIT, BLOOD
HCT: 18.1 % — ABNORMAL LOW (ref 39.0–52.0)
Hemoglobin: 5.2 g/dL — ABNORMAL LOW (ref 13.0–17.0)

## 2021-11-20 LAB — CK: Total CK: 369 U/L (ref 49–397)

## 2021-11-20 LAB — VITAMIN B12: Vitamin B-12: 4354 pg/mL — ABNORMAL HIGH (ref 180–914)

## 2021-11-20 MED ORDER — ACETAMINOPHEN 650 MG RE SUPP: 650.0000 mg | Freq: Four times a day (QID) | RECTAL | Status: DC | PRN

## 2021-11-20 MED ORDER — ONDANSETRON HCL 4 MG/2ML IJ SOLN: 4.0000 mg | Freq: Four times a day (QID) | INTRAMUSCULAR | Status: DC | PRN

## 2021-11-20 MED ORDER — MEMANTINE HCL 5 MG PO TABS
5.0000 mg | ORAL_TABLET | Freq: Two times a day (BID) | ORAL | Status: DC
  Administered 2021-11-20 – 2021-11-23 (×3): 5 mg via ORAL
  Filled 2021-11-20 (×4): qty 1

## 2021-11-20 MED ORDER — DONEPEZIL HCL 5 MG PO TABS
10.0000 mg | ORAL_TABLET | Freq: Every day | ORAL | Status: DC
  Administered 2021-11-20 – 2021-11-22 (×2): 10 mg via ORAL
  Filled 2021-11-20 (×4): qty 2

## 2021-11-20 MED ORDER — ACETAMINOPHEN 325 MG PO TABS: 650.0000 mg | ORAL_TABLET | Freq: Four times a day (QID) | ORAL | Status: DC | PRN

## 2021-11-20 MED ORDER — DIVALPROEX SODIUM ER 250 MG PO TB24
250.0000 mg | ORAL_TABLET | Freq: Every day | ORAL | Status: DC
  Administered 2021-11-21 – 2021-11-22 (×2): 250 mg via ORAL
  Filled 2021-11-20 (×4): qty 1

## 2021-11-20 MED ORDER — ONDANSETRON HCL 4 MG PO TABS: 4.0000 mg | ORAL_TABLET | Freq: Four times a day (QID) | ORAL | Status: DC | PRN

## 2021-11-20 MED ORDER — POTASSIUM CHLORIDE IN NACL 40-0.9 MEQ/L-% IV SOLN
INTRAVENOUS | Status: AC
  Filled 2021-11-20: qty 1000

## 2021-11-20 MED ORDER — POTASSIUM CHLORIDE 10 MEQ/100ML IV SOLN
10.0000 meq | INTRAVENOUS | Status: AC
  Administered 2021-11-20: 10 meq via INTRAVENOUS
  Filled 2021-11-20: qty 100

## 2021-11-20 MED ORDER — SODIUM CHLORIDE 0.9% IV SOLUTION: Freq: Once | INTRAVENOUS | Status: AC

## 2021-11-20 MED ORDER — FERROUS SULFATE 325 (65 FE) MG PO TABS: 325.0000 mg | ORAL_TABLET | ORAL | Status: DC

## 2021-11-20 MED ORDER — SODIUM CHLORIDE 0.9 % IV SOLN
1.0000 g | Freq: Once | INTRAVENOUS | Status: AC
  Administered 2021-11-20: 1 g via INTRAVENOUS
  Filled 2021-11-20: qty 10

## 2021-11-20 MED ORDER — SODIUM CHLORIDE 0.9% IV SOLUTION
Freq: Once | INTRAVENOUS | Status: AC
  Administered 2021-11-21: 1000 mL via INTRAVENOUS

## 2021-11-20 MED ORDER — TAMSULOSIN HCL 0.4 MG PO CAPS
0.4000 mg | ORAL_CAPSULE | Freq: Every evening | ORAL | Status: DC
  Administered 2021-11-20 – 2021-11-23 (×3): 0.4 mg via ORAL
  Filled 2021-11-20 (×4): qty 1

## 2021-11-20 MED ORDER — VITAMIN B-12 1000 MCG PO TABS
1000.0000 ug | ORAL_TABLET | Freq: Every day | ORAL | Status: DC
  Administered 2021-11-21 – 2021-11-23 (×3): 1000 ug via ORAL
  Filled 2021-11-20 (×4): qty 1

## 2021-11-20 MED ORDER — ADULT MULTIVITAMIN W/MINERALS CH
1.0000 | ORAL_TABLET | Freq: Every day | ORAL | Status: DC
  Administered 2021-11-22 – 2021-11-23 (×2): 1 via ORAL
  Filled 2021-11-20 (×3): qty 1

## 2021-11-20 MED ORDER — FINASTERIDE 5 MG PO TABS
5.0000 mg | ORAL_TABLET | Freq: Every day | ORAL | Status: DC
  Administered 2021-11-22 – 2021-11-23 (×2): 5 mg via ORAL
  Filled 2021-11-20 (×4): qty 1

## 2021-11-20 MED ORDER — OCTREOTIDE LOAD VIA INFUSION
50.0000 ug | Freq: Once | INTRAVENOUS | Status: AC
  Administered 2021-11-20: 50 ug via INTRAVENOUS
  Filled 2021-11-20: qty 25

## 2021-11-20 MED ORDER — SODIUM CHLORIDE 0.9 % IV SOLN: INTRAVENOUS | Status: DC

## 2021-11-20 MED ORDER — SODIUM CHLORIDE 0.9 % IV SOLN
2.0000 g | INTRAVENOUS | Status: DC
  Administered 2021-11-21 – 2021-11-23 (×3): 2 g via INTRAVENOUS
  Filled 2021-11-20: qty 20
  Filled 2021-11-20 (×2): qty 2

## 2021-11-20 MED ORDER — PANTOPRAZOLE 80MG IVPB - SIMPLE MED
80.0000 mg | Freq: Once | INTRAVENOUS | Status: AC
  Administered 2021-11-20: 80 mg via INTRAVENOUS
  Filled 2021-11-20: qty 100

## 2021-11-20 MED ORDER — PANTOPRAZOLE INFUSION (NEW) - SIMPLE MED
8.0000 mg/h | INTRAVENOUS | Status: AC
  Administered 2021-11-20 – 2021-11-22 (×7): 8 mg/h via INTRAVENOUS
  Filled 2021-11-20 (×6): qty 100

## 2021-11-20 MED ORDER — PANTOPRAZOLE SODIUM 40 MG IV SOLR: 40.0000 mg | Freq: Two times a day (BID) | INTRAVENOUS | Status: DC

## 2021-11-20 MED ORDER — SODIUM CHLORIDE 0.9 % IV SOLN
10.0000 mL/h | Freq: Once | INTRAVENOUS | Status: AC
  Administered 2021-11-20: 10 mL/h via INTRAVENOUS

## 2021-11-20 MED ORDER — SODIUM CHLORIDE 0.9 % IV SOLN
50.0000 ug/h | INTRAVENOUS | Status: DC
  Administered 2021-11-20 – 2021-11-23 (×7): 50 ug/h via INTRAVENOUS
  Filled 2021-11-20 (×13): qty 1

## 2021-11-20 NOTE — ED Triage Notes (Signed)
Pt here via [facility] from home with AMS. Pt has hx of dementia and prostate CA. Pt was found on the floor by family who thought pt was having a stroke, screening neg with ems. Pt lives alone but has aw caretaker during the day. Pt also having multiple recent falls, pt not on blood thinners. Pt soaked in urine on arrival to ED.   125/63 157-cbg 82 98.6 100 RA%

## 2021-11-20 NOTE — H&P (Signed)
History and Physical    Patient: Daniel Knapp:[id number] DOB: 1951/02/17 DOA: 11/20/2021 DOS: the patient was seen and examined on 11/20/2021 PCP: Derinda Late, MD  Patient coming from: Home  Chief Complaint:  Chief Complaint  Patient presents with   Altered Mental Status   Most of his history was obtained from his sister and caregiver at the bedside.  Patient is unable to provide any history HPI: Daniel Knapp is a 71 y.o. male with medical history significant for Alzheimer's dementia, history of hepatitis C, compensated liver cirrhosis, chart review shows history of rectal bleeding in the past, hypertension who was brought into the ER by EMS for evaluation of mental status changes. Patient has a history of Alzheimer's dementia and has a care giver during the day who is there from 8:20 AM to 5 PM and he is alone at night.  His sister found him on the floor on the morning of his admission and called EMS.  Per his sister he was sitting up on the floor.  She also notes that there was some emesis containing blood clots on the couch. Caregiver notes that he has been increasingly weak over the last couple of days.  At baseline he does not ambulate with any assistive device but he fell several weeks ago and then on the morning of admission. I am unable to do review of systems on this patient due to his history of Alzheimer's dementia   Review of Systems: As mentioned in the history of present illness. All other systems reviewed and are negative. 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-has seen Dr Delaine Lame   Prostate cancer [facility]) 02/2016   Prostate enlargement    Urinary catheter insertion/adjustment/removal    Urinary retention    UTI (lower urinary tract infection)    UTI symptoms    Weight  loss    Past Surgical History:  Procedure Laterality Date   TRANSURETHRAL RESECTION OF BLADDER TUMOR N/A 08/17/2015   Procedure: TRANSURETHRAL RESECTION OF BLADDER TUMOR (TURBT);  Surgeon: [name], MD;  Location: [facility] ORS;  Service: Urology;  Laterality: N/A;   TRANSURETHRAL RESECTION OF PROSTATE N/A 08/17/2015   Procedure: TRANSURETHRAL RESECTION OF THE PROSTATE (TURP);  Surgeon: [name], MD;  Location: [facility] ORS;  Service: Urology;  Laterality: N/A;   XI ROBOTIC ASSISTED VENTRAL HERNIA N/A 09/30/2020   Procedure: XI ROBOTIC ASSISTED VENTRAL HERNIA;  Surgeon: Herbert Pun, MD;  Location: [facility] ORS;  Service: General;  Laterality: N/A;   Social History:  reports that he has never smoked. He has never used smokeless tobacco. He reports that he does not currently use alcohol. He reports that he does not currently use drugs after having used the following drugs: Marijuana.  No Known Allergies  Family History  Problem Relation Age of Onset   Heart disease Mother    Cancer Father    Kidney disease Neg Hx    Prostate cancer Neg Hx     Prior to Admission medications   Medication Sig Start Date End Date Taking? Authorizing Provider  amLODipine (NORVASC) 10 MG tablet Take 10 mg by mouth daily in the afternoon.    [provider]  divalproex (DEPAKOTE ER) 250 MG 24 hr tablet Take  250 mg by mouth daily in the afternoon. 07/25/20   [provider]  donepezil (ARICEPT) 10 MG tablet Take 10 mg by mouth daily in the afternoon. 07/09/20   [provider]  ferrous sulfate 325 (65 FE) MG tablet Take 325 mg by mouth 3 (three) times a week. 07/29/20   [provider]  finasteride (PROSCAR) 5 MG tablet Take 5 mg by mouth daily in the afternoon. Reported on 08/17/2015    [provider]  memantine (NAMENDA) 5 MG tablet Take 5 mg by mouth 2 (two) times daily. 08/14/20   [provider]  Multiple Vitamin (MULTIVITAMIN WITH MINERALS) TABS  tablet Take 1 tablet by mouth daily in the afternoon.    [provider]  tamsulosin (FLOMAX) 0.4 MG CAPS capsule Take 0.4 mg by mouth daily in the afternoon. Reported on 09/01/2015    [provider]  vitamin B-12 (CYANOCOBALAMIN) 1000 MCG tablet Take 1,000 mcg by mouth daily in the afternoon.    [provider]    Physical Exam: Vitals:   11/20/21 1030 11/20/21 1100 11/20/21 1200 11/20/21 1227  BP: 120/63 (!) 108/58 120/68 120/68  Pulse: 83 80 88 83  Resp: 18 13 (!) 25 14  Temp:    97.9 F (36.6 C)  TempSrc:    Axillary  SpO2: 100% 100% 100% 100%  Weight:      Height:       Physical Exam Vitals and nursing note reviewed.  Constitutional:      Comments: Lethargic but arousable  HENT:     Head: Normocephalic and atraumatic.     Nose: Nose normal.     Mouth/Throat:     Mouth: Mucous membranes are dry.  Eyes:     Comments: Pale conjunctiva  Cardiovascular:     Rate and Rhythm: Normal rate and regular rhythm.  Pulmonary:     Effort: Pulmonary effort is normal.     Breath sounds: Normal breath sounds.  Abdominal:     General: Abdomen is flat. Bowel sounds are normal.     Palpations: Abdomen is soft.  Musculoskeletal:        General: Normal range of motion.     Cervical back: Normal range of motion and neck supple.  Skin:    General: Skin is warm and dry.  Neurological:     Motor: Weakness present.  Psychiatric:     Comments: Unable to assess     Data Reviewed: Relevant notes from primary care and specialist visits, past discharge summaries as available in EHR, including Care Everywhere. Prior diagnostic testing as pertinent to current admission diagnoses Updated medications and problem lists for reconciliation ED course, including vitals, labs, imaging, treatment and response to treatment Triage notes, nursing and pharmacy notes and ED provider's notes Notable results as noted in HPI Labs reviewed.  PT 17.0, INR 1.4, ammonia less than 10,  magnesium 1.9, white count 9.4, hemoglobin 3.7, hematocrit 13.4, MCV 81.2, RDW 18.5, platelet count 131, sodium 145, potassium 3.2, chloride 116, bicarb 22, glucose 113, BUN 25, creatinine 0.94, calcium 8.3, total protein 6.8, albumin 3.1, AST 22, ALT 13, alk phos 43 CT scan of the head without contrast shows no evidence of acute intracranial abnormality.Cerebral atrophy . Twelve-lead EKG reviewed by me shows normal sinus rhythm. There are no new results to review at this time.  Assessment and Plan: * Symptomatic anemia Patient has a history of hepatitis C with liver cirrhosis and was brought in by EMS for evaluation  of weakness and lethargy. Patient was found to have a hemoglobin of 3.7g/dl compared to baseline 7.4g/dl a month ago Per his sister she found evidence of hematemesis in his apartment Concern for possible upper GI bleed from possible esophageal varices Continue octreotide infusion started in the ER Continue IV Protonix Continue Rocephin for GI prophylaxis We will transfuse 3 units of packed RBC Check serial H&H Consult gastroenterology  HTN (hypertension) Hold amlodipine for now since patient is normotensive  Dementia ([place]) Continue Aricept and Namenda Patient will need increased nursing care due to his underlying history of dementia  Fall Status post fall from weakness related to anemia Place patient on fall precautions Obtain CK levels  Hypokalemia Supplement potassium Check magnesium levels  Hepatitis Treatment as outlined in 1      Advance Care Planning:   Code Status: Full Code   Consults: Gastroenterology  Family Communication: Greater than 50% of time was spent discussing patient's condition and plan of care with his sister/healthcare power of attorney at the bedside.  All questions and concerns have been addressed.  She verbalizes understanding and agrees with the plan.  CODE STATUS was discussed and patient is a full code.  Severity of Illness: The  appropriate patient status for this patient is INPATIENT. Inpatient status is judged to be reasonable and necessary in order to provide the required intensity of service to ensure the patient's safety. The patient's presenting symptoms, physical exam findings, and initial radiographic and laboratory data in the context of their chronic comorbidities is felt to place them at high risk for further clinical deterioration. Furthermore, it is not anticipated that the patient will be medically stable for discharge from the hospital within 2 midnights of admission.   * I certify that at the point of admission it is my clinical judgment that the patient will require inpatient hospital care spanning beyond 2 midnights from the point of admission due to high intensity of service, high risk for further deterioration and high frequency of surveillance required.*  Author: Collier Bullock, MD 11/20/2021 12:28 PM  For on call review [email or web address].

## 2021-11-20 NOTE — Assessment & Plan Note (Signed)
Treatment as outlined in 1 

## 2021-11-20 NOTE — Assessment & Plan Note (Addendum)
Repleted and resolved 

## 2021-11-20 NOTE — Progress Notes (Signed)
Patient arrived from ED with blood infusion running at 120cc with [facility]

## 2021-11-20 NOTE — Consult Note (Addendum)
GI Inpatient Consult Note  Reason for Consult: Melena/Symptomatic anemia    Attending Requesting Consult: Dr. Acquanetta Belling  History of Present Illness: Daniel Knapp is a 71 y.o. male seen for evaluation of melena and symptomatic anemia at the request of EM physician - Dr. Acquanetta Belling. Patient has a PMH of Alzheimer's dementia, HTN, chronic anemia,  BPH, Hx of prostate cancer, and compensated HCV cirrhosis, and etoh abuse (per his sister). He presented to the [facility] ED via EMS from home after being found on the floor and family reporting altered mental status/decreasing mentation over the past 3 days. Patient is unable to contribute anything to the history, but has his sister and caregiver at bedside who provide the history. Caregiver reports three days ago she began to notice a change in patient's mental status where he was more lethargic and less interactive (he is non-verbal at baseline). Per sister in room, the other sister saw blood clots on the floor when she went to visit him Saturday afternoon but didn't see any stool or blood in his diaper. There was reports of some blood clots on the couch presumed to be from emesis which were found yesterday. He was found this morning sitting on the floor covered in urine and disoriented so EMS was called. Upon presentation to the ED, he had soft BPs 98/47 and otherwise normal vital signs. Labs were significant for normocytic anemia with hemoglobin 3.7, MCV 81, platelets 131K, potassium 3.2, BUN 25, serum creatinine 0.94, albumin 3.1, and INR 1.4. Blood work one month ago showed hemoglobin 7.4. Per EM physician, patient had melena on exam. He has known history of cirrhosis (not actively followed per sister). He was started on broad-spectrum antibiotics, IV fluids, Protonix gtt, and Octreotide. He was ordered to have 3 units pRBCs. GI consulted for further evaluation and management.   Patient seen and examined this afternoon resting comfortably in ED  stretcher. His sister and caregiver are present in the room. He was seen as outpatient by myself 04/2020 for iron-deficiency anemia and scheduled for bidirectional endoscopy, but these procedures were cancelled by patient's family due to concerns of him not being able to swallow the bowel prep or follow instructions. He has never had an endoscopy or colonoscopy. Per family, he has known about Hepatitis C for at least the past decade and is treatment-naive per family. Sister also reports of hx of EtOH abuse, but hasn't been drinking for at least the past 5 years. He does not take NSAIDs at home. He has no history of decompensating cirrhotic events. There has been no report of abdominal distention or abdominal swelling. Caregiver has noticed some lower extremity swelling. There is no reports of jaundice or pruritus. His dementia has been progressive and cognitive impairment has been deteriorating over the last year. They are actively following with Dr. Trena Platt team at [facility]. He has been having a few falls at home.   No witnessed hematemesis, coffee ground emesis, hematochezia. No jaundice.   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-has seen Dr Delaine Lame   Prostate cancer [facility]) 02/2016   Prostate enlargement    Urinary catheter insertion/adjustment/removal    Urinary retention    UTI (lower urinary tract  infection)    UTI symptoms    Weight loss     Problem List: Patient Active Problem List   Diagnosis Date Noted   Symptomatic anemia 11/20/2021   HTN (hypertension)    Dementia (HCC)    Hepatitis    Prostate cancer ([place]) 01/23/2016   BPH (benign prostatic hyperplasia) 08/17/2015   Urinary retention 11/10/2014   BPH (benign prostatic hypertrophy) with urinary retention  11/10/2014    Past Surgical History: Past Surgical History:  Procedure Laterality Date   TRANSURETHRAL RESECTION OF BLADDER TUMOR N/A 08/17/2015   Procedure: TRANSURETHRAL RESECTION OF BLADDER TUMOR (TURBT);  Surgeon: [name], MD;  Location: [facility] ORS;  Service: Urology;  Laterality: N/A;   TRANSURETHRAL RESECTION OF PROSTATE N/A 08/17/2015   Procedure: TRANSURETHRAL RESECTION OF THE PROSTATE (TURP);  Surgeon: [name], MD;  Location: [facility] ORS;  Service: Urology;  Laterality: N/A;   XI ROBOTIC ASSISTED VENTRAL HERNIA N/A 09/30/2020   Procedure: XI ROBOTIC ASSISTED VENTRAL HERNIA;  Surgeon: Herbert Pun, MD;  Location: [facility] ORS;  Service: General;  Laterality: N/A;    Allergies: No Known Allergies  Home Medications: (Not in a hospital admission)  Home medication reconciliation was completed with the patient's sister and caregiver   Scheduled Inpatient Medications:    divalproex  250 mg Oral Q1500   donepezil  10 mg Oral Q1500   ferrous sulfate  325 mg Oral Once per day on Mon Wed Fri   finasteride  5 mg Oral Q1500   lidocaine  1 application  Urethral Once   memantine  5 mg Oral BID   multivitamin with minerals  1 tablet Oral Q1500   [START ON 11/23/2021] pantoprazole  40 mg Intravenous Q12H   tamsulosin  0.4 mg Oral Q1500   vitamin B-12  1,000 mcg Oral Q1500    Continuous Inpatient Infusions:    0.9 % NaCl with KCl 40 mEq / L Stopped (11/20/21 1207)   [START ON 11/21/2021] cefTRIAXone (ROCEPHIN)  IV     octreotide (SANDOSTATIN) 500 mcg in sodium chloride 0.9 % 250 mL (2 mcg/mL) infusion 50 mcg/hr (11/20/21 1143)   pantoprazole 8 mg/hr (11/20/21 1057)   potassium chloride 10 mEq (11/20/21 1137)    PRN Inpatient Medications:  acetaminophen **OR** acetaminophen, ondansetron **OR** ondansetron (ZOFRAN) IV  Family History: family history includes Cancer in his father; Heart disease in his mother.  The patient's family history is negative for inflammatory  bowel disorders, GI malignancy, or solid organ transplantation.  Social History:   reports that he has never smoked. He has never used smokeless tobacco. He reports that he does not currently use alcohol. He reports that he does not currently use drugs after having used the following drugs: Marijuana. The patient denies ETOH, tobacco, or drug use.   Review of Systems:  Unable to obtain 2/2 patient's baseline Alzheimer's dementia    Physical Examination: BP 120/68   Pulse 88   Temp 98.4 F (36.9 C) (Oral)   Resp (!) 25   Ht '6\' 2"'$  (1.88 m)   Wt 90.7 kg   SpO2 100%   BMI 25.67 kg/m  Gen: NAD, alert, caregiver and sister in room HEENT: PEERLA, EOMI Neck: supple, + JVD; no thyromegaly Chest: CTA bilaterally, no wheezes, crackles, or other adventitious sounds CV: RRR, no m/g/c/r Abd: soft, NT, ND, +BS in all four quadrants; no HSM, guarding, rigidity, or rebound tenderness, no fluid wave  Ext: 1+ BLEE Skin: no rash or lesions noted. No Jaundice  Lymph: no LAD Neuro: Does follow some commands, non-verbal which is baseline  Data: Lab Results  Component Value Date   WBC 9.4 11/20/2021   HGB 3.7 (LL) 11/20/2021   HCT 13.4 (LL) 11/20/2021   MCV 81.2 11/20/2021   PLT 131 (L) 11/20/2021   Recent Labs  Lab 11/20/21 0922  HGB 3.7*   Lab Results  Component Value Date   NA 145 11/20/2021   K 3.2 (L) 11/20/2021   CL 116 (H) 11/20/2021   CO2 22 11/20/2021   BUN 25 (H) 11/20/2021   CREATININE 0.94 11/20/2021   Lab Results  Component Value Date   ALT 13 11/20/2021   AST 22 11/20/2021   ALKPHOS 43 11/20/2021   BILITOT 0.5 11/20/2021   Recent Labs  Lab 11/20/21 1056  APTT 32  INR 1.4*    Assessment/Plan:  71 y/o AA male with a PMH of HTN, dementia, BPH, Hx of prostate cancer s/p radiation therapy, chronic anemia, and compensated HCV cirrhosis presented to the [facility] ED via EMS from home for chief complaint of altered mental status x 3 days found to have profound anemia  concerning for GI bleed  Profound anemia with severe iron-deficiency - labs showed hemoglobin 3.7, ferritin 4, iron 9, iron sat 2%, and TIBC 469. Hemoglobin one month ago was 7.4 and was 10 eighteen months ago.   Melena/Hematemesis - concern for UGIB. Differential includes peptic ulcer disease, gastritis, esophageal varices, AVMs, erosive esophagitis, occult GI neoplasm, and less likely a LGI source.   Compensated HCV cirrhosis - MELD-Na 10, Child's Class B (7 pts)  Progressive Alzheimer's and vascular dementia   Chronic HCV - reportedly treatment-naive   Hypokalemia   Recommendations:  - Maintain 2 large bore IVs for access - Transfuse 3 units pRBCs as scheduled. Continue to monitor serial H&H to maintain hemoglobin >7.0.  - Continue Protonix gtt, Octreotide gtt, and broad-spectrum antibiotics  - Continue supportive care with IV fluid hydration with replacement of electrolytes - Discussed endoscopic evaluation for diagnostic purposes as well as potential therapeutic interventions (variceal banding, bipolar cautery, epinephrine, clipping, etc) - Will plan for EGD tomorrow with Dr. Virgina Jock. If EGD is nondiagnostic, will consider potential colonoscopy after discussion with family about goals of care - NPO status - Will check HCV RNA with reflux genotype  - Further recommendations after procedure tomorrow  - Following along with you    I reviewed the risks (including bleeding, perforation, infection, anesthesia complications, cardiac/respiratory complications), benefits and alternatives of EGD. Patient's family consent to proceed.     Thank you for the consult. Please call with questions or concerns.  Geanie Kenning, PA-C [facility] [phone]

## 2021-11-20 NOTE — Progress Notes (Signed)
Admission profile updated. ?

## 2021-11-20 NOTE — Assessment & Plan Note (Addendum)
Patient has a history of hepatitis C with liver cirrhosis and was brought in by EMS for evaluation of weakness and lethargy. Patient was found to have a hemoglobin of 3.7g/dl compared to baseline 7.4g/dl a month ago.  Status post 4 PRBC transfusion Per his sister she found evidence of hematemesis in his apartment S/p EGD by Dr. 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 He will need transfer for BRTO at [place] protonix and octreotide drips as well as the abx. Dr. Virgina Jock has updated I will reach out to the patient's sister.  I have placed him on transfer waitlist at [facility] for BRTO procedure IR Dr. Earleen Newport and GI Dr. Carlean Purl has graciously excepted for intervention.  He will be admitted under hospitalist/TRH service Getting CT angio abdomen and pelvis per GI/IR request while waiting for the bed

## 2021-11-20 NOTE — ED Provider Notes (Signed)
And showed  [facility] Provider Note    Event Date/Time   First MD Initiated Contact with Patient 11/20/21 [phone]     (approximate)   History   Altered Mental Status   Daniel Knapp is a 71 y.o. male past medical history of prostate cancer hypertension, hep C, cirrhosis who presents with altered mental status.  Patient accompanied by his sister and caretaker.  Starting on Friday 3 days ago patient has been less responsive than normal.  Typically does not talk much but is able to ambulate and follow commands.  Has been less responsive and having difficulty going to the bathroom.  They did notice some bright red blood per rectum but says that this is a fairly stable thing for the patient he has a history of hemorrhoids.  Caretaker said that several months ago he had several days of tarry stools but this resolved.  No vomiting or hematemesis noted.  Patient's other sister who I spoke with on the phone said that on Sunday she found blood clots on the floor that were isolated she took him to the bathroom did not notice any blood in his underwear and there was no vomitus mixed in with this.  She is not sure where it came from.  Patient was found today on the floor.  He has not had any complaints.  Patient not really able to provide much history but denies abdominal pain or chest pain when questioned.     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-has seen Dr Delaine Lame   Prostate cancer [facility]) 02/2016   Prostate enlargement    Urinary catheter insertion/adjustment/removal    Urinary retention    UTI (lower urinary tract infection)    UTI symptoms    Weight loss     Patient Active Problem List   Diagnosis Date Noted   Prostate cancer ([place]) 01/23/2016   BPH (benign prostatic  hyperplasia) 08/17/2015   Urinary retention 11/10/2014   BPH (benign prostatic hypertrophy) with urinary retention 11/10/2014     Physical Exam  Triage Vital Signs: ED Triage Vitals  Enc Vitals Group     BP 11/20/21 0916 (!) 98/47     Pulse Rate 11/20/21 0916 92     Resp 11/20/21 0916 16     Temp 11/20/21 0916 98.4 F (36.9 C)     Temp Source 11/20/21 0916 Oral     SpO2 11/20/21 0916 95 %     Weight 11/20/21 0917 199 lb 15.3 oz (90.7 kg)     Height 11/20/21 0917 '6\' 2"'$  (1.88 m)     Head Circumference --      Peak Flow --      Pain Score 11/20/21 0917 0     Pain Loc --      Pain Edu? --      Excl. in [place]? --     Most recent vital signs: Vitals:   11/20/21 1030 11/20/21 1100  BP: 120/63 (!) 108/58  Pulse: 83 80  Resp: 18 13  Temp:    SpO2: 100% 100%     General: Awake, no distress.  CV:  Good peripheral perfusion.  Resp:  Normal effort.  No increased work of breathing Abd:  No  distention.  Neuro:             Awake, eyes open, does follow some minimal commands moves all extremities spontaneously pupils are pinpoint bilaterally Other:  Black melanotic stool on rectal exam   ED Results / Procedures / Treatments  Labs (all labs ordered are listed, but only abnormal results are displayed) Labs Reviewed  COMPREHENSIVE METABOLIC PANEL - Abnormal; Notable for the following components:      Result Value   Potassium 3.2 (*)    Chloride 116 (*)    Glucose, Bld 113 (*)    BUN 25 (*)    Calcium 8.3 (*)    Albumin 3.1 (*)    All other components within normal limits  CBC - Abnormal; Notable for the following components:   RBC 1.65 (*)    Hemoglobin 3.7 (*)    HCT 13.4 (*)    MCH 22.4 (*)    MCHC 27.6 (*)    RDW 18.5 (*)    Platelets 131 (*)    All other components within normal limits  PROTIME-INR - Abnormal; Notable for the following components:   Prothrombin Time 17.0 (*)    INR 1.4 (*)    All other components within normal limits  MAGNESIUM  APTT   URINALYSIS, COMPLETE (UACMP) WITH MICROSCOPIC  AMMONIA  CBG MONITORING, ED  TYPE AND SCREEN  PREPARE RBC (CROSSMATCH)  ABO/RH     EKG  EKG reviewed by myself shows sinus tachycardia normal axis normal intervals no acute ischemic changes   RADIOLOGY I reviewed and interpreted the CT scan of the brain which does not show any acute intracranial process    PROCEDURES:  Critical Care performed: Yes, see critical care procedure note(s)  .1-3 Lead EKG Interpretation  Performed by: Rada Hay, MD Authorized by: Rada Hay, MD     Interpretation: normal     ECG rate assessment: normal     Ectopy: none     Conduction: normal     The patient is on the cardiac monitor to evaluate for evidence of arrhythmia and/or significant heart rate changes.   MEDICATIONS ORDERED IN ED: Medications  pantoprozole (PROTONIX) 80 mg /NS 100 mL infusion (8 mg/hr Intravenous New Bag/Given 11/20/21 1057)  pantoprazole (PROTONIX) injection 40 mg (has no administration in time range)  cefTRIAXone (ROCEPHIN) 1 g in sodium chloride 0.9 % 100 mL IVPB (has no administration in time range)  octreotide (SANDOSTATIN) 2 mcg/mL load via infusion 50 mcg (has no administration in time range)    And  octreotide (SANDOSTATIN) 500 mcg in sodium chloride 0.9 % 250 mL (2 mcg/mL) infusion (has no administration in time range)  potassium chloride 10 mEq in 100 mL IVPB (has no administration in time range)  0.9 %  sodium chloride infusion (10 mL/hr Intravenous New Bag/Given 11/20/21 1033)  pantoprazole (PROTONIX) 80 mg /NS 100 mL IVPB (0 mg Intravenous Stopped 11/20/21 1057)     IMPRESSION / MDM / ASSESSMENT AND PLAN / ED COURSE  I reviewed the triage vital signs and the nursing notes.                              Patient's presentation is most consistent with acute presentation with potential threat to life or bodily function.  Differential diagnosis includes, but is not limited to, acute ankle  intracranial bleed including subdural, metabolic abnormality, anemia, ACS, hepatic encephalopathy   Patient is a 71 year old  male with dementia presenting with altered mental status.  Found on the floor today has been somewhat more somnolent over the last 3 days.  There was also noted blood clots on the floor on Sunday unclear where that came from.  Patient's hemoglobin is 3.7.  In care everywhere was 7.4 on 5/12 prior to that in January had been 12.  T's are normal.  Type and screen sent.  Platelets mildly low at 130.  Mildly hypokalemic with potassium 3.2.  Renal function is normal.  Mag is normal.  On exam he is awake does not really follow much in the way of commands but is able to move all extremities spontaneously exam appears to be nonfocal.  He does have black melanotic stool on rectal exam.  Reviewed his history there is documented cirrhosis based on ultrasound imaging in 2017.  Has a history of hep C.  Will treat as potential variceal bleeding.  We will give Protonix Rocephin octreotide and transfuse 3 units.  Will discuss with GI.  I have paged and message Dr. Virgina Jock with GI.  Will discuss Hospitalist for admission.  Discussed with Dr. Virgina Jock who agrees with management.  Discussed with hospitalist for admission.   FINAL CLINICAL IMPRESSION(S) / ED DIAGNOSES   Final diagnoses:  Anemia, unspecified type  UGIB (upper gastrointestinal bleed)     Rx / DC Orders   ED Discharge Orders     None        Note:  This document was prepared using Dragon voice recognition software and may include unintentional dictation errors.   Rada Hay, MD 11/20/21 1131

## 2021-11-20 NOTE — Assessment & Plan Note (Addendum)
Continue Aricept and Namenda Patient will need increased nursing care due to his underlying history of dementia.

## 2021-11-20 NOTE — Assessment & Plan Note (Addendum)
Status post fall from weakness related to anemia Maintain fall precautions CK 369

## 2021-11-20 NOTE — Assessment & Plan Note (Addendum)
Hold amlodipine for now since patient is normotensive.

## 2021-11-20 NOTE — ED Notes (Signed)
Dr. Starleen Blue at bedside at this time for evaluation.

## 2021-11-21 ENCOUNTER — Encounter
Admission: EM | Disposition: A | Discharge status: Other Acute Inpatient Hospital | Payer: Self-pay | Source: Home / Self Care | Attending: Internal Medicine

## 2021-11-21 ENCOUNTER — Encounter: Payer: Self-pay | Admitting: Internal Medicine

## 2021-11-21 ENCOUNTER — Inpatient Hospital Stay: Payer: Medicare HMO

## 2021-11-21 ENCOUNTER — Inpatient Hospital Stay: Payer: Medicare HMO | Admitting: Anesthesiology

## 2021-11-21 DIAGNOSIS — F02818 Dementia in other diseases classified elsewhere, unspecified severity, with other behavioral disturbance: Secondary | ICD-10-CM

## 2021-11-21 DIAGNOSIS — I10 Essential (primary) hypertension: Secondary | ICD-10-CM | POA: Diagnosis not present

## 2021-11-21 DIAGNOSIS — E876 Hypokalemia: Secondary | ICD-10-CM

## 2021-11-21 DIAGNOSIS — G309 Alzheimer's disease, unspecified: Secondary | ICD-10-CM | POA: Diagnosis not present

## 2021-11-21 DIAGNOSIS — D649 Anemia, unspecified: Secondary | ICD-10-CM | POA: Diagnosis not present

## 2021-11-21 HISTORY — PX: ESOPHAGOGASTRODUODENOSCOPY: SHX5428

## 2021-11-21 LAB — PROTIME-INR
INR: 1.3 — ABNORMAL HIGH (ref 0.8–1.2)
Prothrombin Time: 15.8 seconds — ABNORMAL HIGH (ref 11.4–15.2)

## 2021-11-21 LAB — COMPREHENSIVE METABOLIC PANEL
ALT: 15 U/L (ref 0–44)
AST: 28 U/L (ref 15–41)
Albumin: 3.3 g/dL — ABNORMAL LOW (ref 3.5–5.0)
Alkaline Phosphatase: 54 U/L (ref 38–126)
Anion gap: 6 (ref 5–15)
BUN: 16 mg/dL (ref 8–23)
CO2: 28 mmol/L (ref 22–32)
Calcium: 8.7 mg/dL — ABNORMAL LOW (ref 8.9–10.3)
Chloride: 110 mmol/L (ref 98–111)
Creatinine, Ser: 0.93 mg/dL (ref 0.61–1.24)
GFR, Estimated: >60 mL/min (ref >60)
Glucose, Bld: 113 mg/dL — ABNORMAL HIGH (ref 70–99)
Potassium: 3.5 mmol/L (ref 3.5–5.1)
Sodium: 144 mmol/L (ref 135–145)
Total Bilirubin: 1.3 mg/dL — ABNORMAL HIGH (ref 0.3–1.2)
Total Protein: 7.1 g/dL (ref 6.5–8.1)

## 2021-11-21 LAB — HEMOGLOBIN AND HEMATOCRIT, BLOOD
HCT: 20.9 % — ABNORMAL LOW (ref 39.0–52.0)
HCT: 26.2 % — ABNORMAL LOW (ref 39.0–52.0)
HCT: 24.4 % — ABNORMAL LOW (ref 39.0–52.0)
Hemoglobin: 6.5 g/dL — ABNORMAL LOW (ref 13.0–17.0)
Hemoglobin: 8.3 g/dL — ABNORMAL LOW (ref 13.0–17.0)
Hemoglobin: 7.8 g/dL — ABNORMAL LOW (ref 13.0–17.0)

## 2021-11-21 LAB — PREPARE RBC (CROSSMATCH)

## 2021-11-21 SURGERY — EGD (ESOPHAGOGASTRODUODENOSCOPY)
Anesthesia: General

## 2021-11-21 MED ORDER — SODIUM CHLORIDE 0.9% IV SOLUTION: Freq: Once | INTRAVENOUS | Status: AC

## 2021-11-21 MED ORDER — PROPOFOL 10 MG/ML IV BOLUS
INTRAVENOUS | Status: AC
  Filled 2021-11-21: qty 20

## 2021-11-21 MED ORDER — PANTOPRAZOLE INFUSION (NEW) - SIMPLE MED: 8.0000 mg/h | INTRAVENOUS | Status: DC

## 2021-11-21 MED ORDER — PROPOFOL 10 MG/ML IV BOLUS
INTRAVENOUS | Status: DC | PRN
  Administered 2021-11-21 (×3): 20 mg via INTRAVENOUS
  Administered 2021-11-21: 50 mg via INTRAVENOUS
  Administered 2021-11-21: 30 mg via INTRAVENOUS

## 2021-11-21 MED ORDER — DEXMEDETOMIDINE HCL IN NACL 200 MCG/50ML IV SOLN
INTRAVENOUS | Status: DC | PRN
  Administered 2021-11-21: 12 ug via INTRAVENOUS

## 2021-11-21 MED ORDER — OXYMETAZOLINE HCL 0.05 % NA SOLN
NASAL | Status: AC
  Filled 2021-11-21: qty 30

## 2021-11-21 MED ORDER — IOHEXOL 350 MG/ML SOLN
100.0000 mL | Freq: Once | INTRAVENOUS | Status: AC | PRN
  Administered 2021-11-21: 100 mL via INTRAVENOUS

## 2021-11-21 MED ORDER — SODIUM CHLORIDE 0.9 % IV SOLN: 50.0000 ug/h | INTRAVENOUS | Status: DC

## 2021-11-21 NOTE — Progress Notes (Signed)
       CROSS COVER NOTE  NAME: Daniel Knapp MRN: [id number] DOB : 1951/01/18   HGB 6.5 this morning. 1U PRBC ordered. This will the the patient's fourth unit of blood this hospitalization.  Neomia Glass DNP, MHA, FNP-BC Nurse Practitioner [facility] Pager [phone]

## 2021-11-21 NOTE — Interval H&P Note (Signed)
History and Physical Interval Note: Progress Note from 11/21/21  was reviewed and patient has completed 1 u prbc transfusion with hgb improvement to 8.3. There were no other interval changes after seeing and examining the patient.  Written consent was obtained from the patient after discussion of risks, benefits, and alternatives. Patient has consented to proceed with Esophagogastroduodenoscopy with possible intervention   11/21/2021 12:04 PM  Daniel Knapp  has presented today for surgery, with the diagnosis of Melena, symptomatic anemia.  The various methods of treatment have been discussed with the patient and family. After consideration of risks, benefits and other options for treatment, the patient has consented to  Procedure(s): ESOPHAGOGASTRODUODENOSCOPY (EGD) (N/A) as a surgical intervention.  The patient's history has been reviewed, patient examined, no change in status, stable for surgery.  I have reviewed the patient's chart and labs.  Questions were answered to the patient's satisfaction.     Annamaria Helling

## 2021-11-21 NOTE — Progress Notes (Signed)
  Progress Note   Patient: Daniel Knapp ZOX:[id number] DOB: 1950/11/22 DOA: 11/20/2021     1 DOS: the patient was seen and examined on 11/21/2021   Brief hospital course: 71 y.o. male with hypertension, rectal bleeding Alzheimer's dementia, history of hepatitis C, compensated liver cirrhosis admitted for severe symptomatic anemia concerning for GI bleed with a hemoglobin of 3.7 status post 4 PRBC transfusion  6/13: EGD showed fairly large gastric varix that looks like it has recently bled.  Will need BRTO procedure under IR.  Waiting for transfer at [facility].    Assessment and Plan: * Symptomatic anemia Patient has a history of hepatitis C with liver cirrhosis and was brought in by EMS for evaluation of weakness and lethargy. Patient was found to have a hemoglobin of 3.7g/dl compared to baseline 7.4g/dl a month ago.  Status post 4 PRBC transfusion Per his sister she found evidence of hematemesis in his apartment S/p EGD by Dr. 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 He will need transfer for BRTO at [place] protonix and octreotide drips as well as the abx. Dr. Virgina Jock has updated I will reach out to the patient's sister.  I have placed him on transfer waitlist at [facility] for BRTO procedure IR Dr. Earleen Newport and GI Dr. Carlean Purl has graciously excepted for intervention.  He will be admitted under hospitalist/TRH service Getting CT angio abdomen and pelvis per GI/IR request while waiting for the bed  HTN (hypertension) Hold amlodipine for now since patient is normotensive.  Dementia ([place]) Continue Aricept and Namenda Patient will need increased nursing care due to his underlying history of dementia.  Fall Status post fall from weakness related to anemia Maintain fall precautions CK 369  Hypokalemia Repleted and resolved  Hepatitis Treatment as outlined in 1        Subjective: Laying in bed not able to provide any history  Physical  Exam: Vitals:   11/21/21 1302 11/21/21 1322 11/21/21 1332 11/21/21 1404  BP: 103/79 109/73 117/81 125/75  Pulse: 82 67 70 64  Resp: 17 15 (!) 22 18  Temp:    98 F (36.7 C)  TempSrc:    Oral  SpO2: 100% 100% 100% 98%  Weight:      Height:       71 year old male lying in the bed without any acute distress Lungs clear to auscultation bilaterally Cardiovascular regular rate and rhythm Eyes: Conjunctival pallor present Abdomen soft, benign Neuro: Nonfocal, awake.  Not oriented likely at baseline Skin no rash or lesion Psychiatry unable to assess  Data Reviewed:  Hemoglobin 8.3, EGD showed esophageal varices  Family Communication: Sister was updated by GI after EGD  Disposition: Status is: Inpatient Remains inpatient appropriate because: Treatment of GI bleed and potential transfer waiting for [place] for BRTO procedure under IR   Planned Discharge Destination: [place] when bed available    DVT prophylaxis-SCD Time spent: 35 minutes  Author: Max Sane, MD 11/21/2021 3:17 PM  For on call review [email or web address].

## 2021-11-21 NOTE — Hospital Course (Signed)
71 y.o. male with hypertension, rectal bleeding Alzheimer's dementia, history of hepatitis C, compensated liver cirrhosis admitted for severe symptomatic anemia concerning for GI bleed with a hemoglobin of 3.7 status post 4 PRBC transfusion  6/13: EGD showed fairly large gastric varix that looks like it has recently bled.  Will need BRTO procedure under IR.  Waiting for transfer at [facility].

## 2021-11-21 NOTE — Anesthesia Preprocedure Evaluation (Addendum)
Anesthesia Evaluation  Patient identified by MRN, date of birth, ID band Patient awake and Patient confused    Reviewed: Allergy & Precautions, NPO status , Patient's Chart, lab work & pertinent test results, Unable to perform ROS - Chart review only  Airway Mallampati: II  TM Distance: >3 FB Neck ROM: full    Dental  (+) Edentulous Upper, Partial Lower   Pulmonary neg pulmonary ROS,    Pulmonary exam normal  + decreased breath sounds      Cardiovascular Exercise Tolerance: Poor hypertension, Pt. on medications negative cardio ROS Normal cardiovascular exam Rhythm:Regular     Neuro/Psych Dementia HX of dementia... negative neurological ROS  negative psych ROS   GI/Hepatic negative GI ROS, Neg liver ROS, (+) Hepatitis -  Endo/Other  negative endocrine ROS  Renal/GU negative Renal ROS  negative genitourinary   Musculoskeletal   Abdominal Normal abdominal exam  (+)   Peds negative pediatric ROS (+)  Hematology negative hematology ROS (+) Blood dyscrasia, anemia ,   Anesthesia Other Findings Past Medical History: No date: Acute cystitis with hematuria No date: Anemia No date: Dementia ([place]) No date: Hepatitis     Comment:  C+ -questionable in pcp note No date: HTN (hypertension) No date: Positive RPR test     Comment:  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-has seen Dr Delaine Lame 02/2016: Prostate cancer ([place]) No date: Prostate enlargement No date: Urinary catheter insertion/adjustment/removal No date: Urinary retention No date: UTI (lower urinary tract infection) No date: UTI symptoms No date: Weight loss  Past Surgical History: 08/17/2015: TRANSURETHRAL RESECTION OF BLADDER TUMOR; N/A     Comment:  Procedure: TRANSURETHRAL RESECTION OF BLADDER TUMOR               (TURBT);  Surgeon: [name], MD;  Location:               [facility] ORS;  Service: Urology;  Laterality: N/A; 08/17/2015: TRANSURETHRAL RESECTION OF  PROSTATE; N/A     Comment:  Procedure: TRANSURETHRAL RESECTION OF THE PROSTATE               (TURP);  Surgeon: [name], MD;  Location: [facility]              ORS;  Service: Urology;  Laterality: N/A; 09/30/2020: XI ROBOTIC ASSISTED VENTRAL HERNIA; N/A     Comment:  Procedure: XI ROBOTIC ASSISTED VENTRAL HERNIA;  Surgeon:              Herbert Pun, MD;  Location: [facility] ORS;  Service:              General;  Laterality: N/A;  BMI    Body Mass Index: 25.47 kg/m      Reproductive/Obstetrics negative OB ROS                             Anesthesia Physical Anesthesia Plan  ASA: 3  Anesthesia Plan: General   Post-op Pain Management:    Induction:   PONV Risk Score and Plan: Propofol infusion and TIVA  Airway Management Planned: Natural Airway  Additional Equipment:   Intra-op Plan:   Post-operative Plan:   Informed Consent: I have reviewed the patients History and Physical, chart, labs and discussed the procedure including the risks, benefits and alternatives for the proposed anesthesia with the patient or authorized representative who has indicated his/her understanding and acceptance.     Dental Advisory Given  Plan Discussed with: CRNA  and Surgeon  Anesthesia Plan Comments:         Anesthesia Quick Evaluation

## 2021-11-21 NOTE — H&P (View-Only) (Signed)
Inpatient Follow-up/Progress Note   Patient ID: Daniel Knapp is a 71 y.o. male.  Overnight Events / Subjective Findings Pt required 1 additional u prbc this morning to help get him >7g. No o/n signs of gib- nursing denies emesis or further bowel movements. Vital signs have remained stable. Patient denies abdominal pain. More interactive today. NPO since midnight. Plan for EGD today.  Spoke with patient's sister/POA, Daniel Knapp, and she would like the patient to undergo colonoscopy while here. She is agreeable to placing the NGT during the procedure today for prep administration.  Review of Systems  Unable to perform ROS: Dementia  Gastrointestinal:  Negative for abdominal pain.     Medications  Current Facility-Administered Medications:    0.9 %  sodium chloride infusion (Manually program via Guardrails IV Fluids), , Intravenous, Once, Foust, Katy L, NP   0.9 %  sodium chloride infusion, , Intravenous, Continuous, Croley, Granville M, PA-C   acetaminophen (TYLENOL) tablet 650 mg, 650 mg, Oral, Q6H PRN **OR** acetaminophen (TYLENOL) suppository 650 mg, 650 mg, Rectal, Q6H PRN, Agbata, Tochukwu, MD   cefTRIAXone (ROCEPHIN) 2 g in sodium chloride 0.9 % 100 mL IVPB, 2 g, Intravenous, Q24H, Agbata, Tochukwu, MD   divalproex (DEPAKOTE ER) 24 hr tablet 250 mg, 250 mg, Oral, Q1500, Agbata, Tochukwu, MD   donepezil (ARICEPT) tablet 10 mg, 10 mg, Oral, QHS, Agbata, Tochukwu, MD, 10 mg at 11/20/21 2152   finasteride (PROSCAR) tablet 5 mg, 5 mg, Oral, Q1500, Agbata, Tochukwu, MD   memantine (NAMENDA) tablet 5 mg, 5 mg, Oral, BID, Agbata, Tochukwu, MD, 5 mg at 11/20/21 2152   multivitamin with minerals tablet 1 tablet, 1 tablet, Oral, Q1500, Agbata, Tochukwu, MD   [COMPLETED] octreotide (SANDOSTATIN) 2 mcg/mL load via infusion 50 mcg, 50 mcg, Intravenous, Once, 50 mcg at 11/20/21 1136 **AND** octreotide (SANDOSTATIN) 500 mcg in sodium chloride 0.9 % 250 mL (2 mcg/mL) infusion, 50 mcg/hr,  Intravenous, Continuous, Agbata, Tochukwu, MD, Last Rate: 25 mL/hr at 11/21/21 0651, 50 mcg/hr at 11/21/21 0651   ondansetron (ZOFRAN) tablet 4 mg, 4 mg, Oral, Q6H PRN **OR** ondansetron (ZOFRAN) injection 4 mg, 4 mg, Intravenous, Q6H PRN, Agbata, Tochukwu, MD   [START ON 11/23/2021] pantoprazole (PROTONIX) injection 40 mg, 40 mg, Intravenous, Q12H, Agbata, Tochukwu, MD   pantoprozole (PROTONIX) 80 mg /NS 100 mL infusion, 8 mg/hr, Intravenous, Continuous, Agbata, Tochukwu, MD, Last Rate: 10 mL/hr at 11/21/21 0651, 8 mg/hr at 11/21/21 0651   tamsulosin (FLOMAX) capsule 0.4 mg, 0.4 mg, Oral, QPM, Agbata, Tochukwu, MD, 0.4 mg at 11/20/21 2152   vitamin B-12 (CYANOCOBALAMIN) tablet 1,000 mcg, 1,000 mcg, Oral, Q1500, Agbata, Tochukwu, MD  sodium chloride     cefTRIAXone (ROCEPHIN)  IV     octreotide (SANDOSTATIN) 500 mcg in sodium chloride 0.9 % 250 mL (2 mcg/mL) infusion 50 mcg/hr (11/21/21 0651)   pantoprazole 8 mg/hr (11/21/21 0651)    acetaminophen **OR** acetaminophen, ondansetron **OR** ondansetron (ZOFRAN) IV   Objective    Vitals:   11/21/21 0246 11/21/21 0434 11/21/21 0621 11/21/21 0653  BP: 109/69 115/65 120/63 114/78  Pulse: 75 66 64 72  Resp: '16 18 16 14  '$ Temp: 98.4 F (36.9 C) 98 F (36.7 C) 98.3 F (36.8 C) 98 F (36.7 C)  TempSrc: Oral  Oral Oral  SpO2: 99% 100% 100% 100%  Weight:      Height:         Physical Exam Vitals and nursing note reviewed.  Constitutional:  General: He is not in acute distress.    Appearance: He is ill-appearing (chronically). He is not toxic-appearing or diaphoretic.  HENT:     Head: Normocephalic and atraumatic.     Nose: Nose normal.     Mouth/Throat:     Mouth: Mucous membranes are moist.     Pharynx: Oropharynx is clear.     Comments: Poor dentition Eyes:     General: No scleral icterus.    Extraocular Movements: Extraocular movements intact.  Cardiovascular:     Rate and Rhythm: Normal rate and regular rhythm.     Heart  sounds: Normal heart sounds. No murmur heard.    No friction rub. No gallop.  Pulmonary:     Effort: Pulmonary effort is normal. No respiratory distress.     Breath sounds: Normal breath sounds. No wheezing, rhonchi or rales.  Abdominal:     General: Bowel sounds are normal. There is no distension.     Palpations: Abdomen is soft.     Tenderness: There is no abdominal tenderness. There is no guarding or rebound.  Musculoskeletal:     Cervical back: Neck supple.     Right lower leg: Edema present.     Left lower leg: Edema present.  Skin:    General: Skin is warm and dry.     Coloration: Skin is not jaundiced.  Neurological:     Mental Status: He is alert.     Comments: Seems to be improving towards baseline. Mostly non-communicative     Laboratory Data Recent Labs  Lab 11/20/21 0922 11/20/21 1700 11/21/21 0502  WBC 9.4  --   --   HGB 3.7* 5.2* 6.5*  HCT 13.4* 18.1* 20.9*  PLT 131*  --   --    Recent Labs  Lab 11/20/21 0922 11/21/21 0502  NA 145 144  K 3.2* 3.5  CL 116* 110  CO2 22 28  BUN 25* 16  CREATININE 0.94 0.93  CALCIUM 8.3* 8.7*  PROT 6.8 7.1  BILITOT 0.5 1.3*  ALKPHOS 43 54  ALT 13 15  AST 22 28  GLUCOSE 113* 113*   Recent Labs  Lab 11/20/21 1056 11/21/21 0502  INR 1.4* 1.3*      Imaging Studies: US Abdomen Limited  Result Date: 11/20/2021 CLINICAL DATA:  Ascites, cirrhosis. EXAM: LIMITED ABDOMEN ULTRASOUND FOR ASCITES TECHNIQUE: Limited ultrasound survey for ascites was performed in all four abdominal quadrants. COMPARISON:  None Available. FINDINGS: Four-quadrant ultrasound demonstrates no significant abdominal ascites. IMPRESSION: No significant abdominal ascites. Electronically Signed   By: San Morelle M.D.   On: 11/20/2021 14:34   CT Cervical Spine Wo Contrast  Result Date: 11/20/2021 CLINICAL DATA:  Trauma EXAM: CT CERVICAL SPINE WITHOUT CONTRAST TECHNIQUE: Multidetector CT imaging of the cervical spine was performed without  intravenous contrast. Multiplanar CT image reconstructions were also generated. RADIATION DOSE REDUCTION: This exam was performed according to the departmental dose-optimization program which includes automated exposure control, adjustment of the mA and/or kV according to patient size and/or use of iterative reconstruction technique. COMPARISON:  None Available. FINDINGS: Alignment: Degenerative reversal of the normal cervical lordosis. Skull base and vertebrae: No acute fracture. No primary bone lesion or focal pathologic process. Soft tissues and spinal canal: No prevertebral fluid or swelling. No visible canal hematoma. Disc levels: Moderate to severe disc space height loss and anterior osteophytosis of C5 through T1, with otherwise mild disc space height loss and osteophytosis of the upper cervical levels. Upper chest: Negative. Other: None.  IMPRESSION: 1. No acute fracture or static subluxation of the cervical spine. 2. Moderate to severe disc space height loss and anterior osteophytosis of C5 through T1, with associated degenerative reversal of the normal cervical lordosis. Electronically Signed   By: Delanna Ahmadi M.D.   On: 11/20/2021 10:35   CT HEAD WO CONTRAST (5MM)  Result Date: 11/20/2021 CLINICAL DATA:  Altered mental status, nontraumatic (Ped 0-17y) EXAM: CT HEAD WITHOUT CONTRAST TECHNIQUE: Contiguous axial images were obtained from the base of the skull through the vertex without intravenous contrast. RADIATION DOSE REDUCTION: This exam was performed according to the departmental dose-optimization program which includes automated exposure control, adjustment of the mA and/or kV according to patient size and/or use of iterative reconstruction technique. COMPARISON:  MRI head April 16, 2018. FINDINGS: Brain: No evidence of acute infarction, hemorrhage, hydrocephalus, extra-axial collection or mass lesion/mass effect. Cerebral atrophy. Vascular: No hyperdense vessel identified. Calcific  intracranial atherosclerosis. Skull: No acute fracture. Sinuses/Orbits: Largely clear sinuses.  No acute orbital findings. Other: No mastoid effusions. IMPRESSION: 1. No evidence of acute intracranial abnormality. 2.  Cerebral atrophy (ICD10-G31.9). Electronically Signed   By: Margaretha Sheffield M.D.   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 No further signs of bleeding o/n per nursing NPO currently Morning labs reviewed Plan for egd today Protonix 40 mg iv q12 h - currently on gtt per primary Hold dvt ppx Octreotide bolus and gtt for 72 hours total (currently on) Ceftriaxone 1 g iv q24 for 7 days total for sbp ppx Monitor H&H.  Transfusion and resuscitation as per primary team Avoid frequent lab draws to prevent lab induced anemia Supportive care and antiemetics as per primary team Maintain two sites IV access Avoid nsaids Monitor for GIB.  No ascites noted on [place] Recommend iv iron infusions while in the hospital HCV testing pending  Spoke with sister/poa, Daniel Knapp, on the phone this morning. She is agreeable to NGT being placed during endoscopy today to allow for colonoscopy prep administration. Potential colonoscopy tomorrow pending patient prep.  Further  recommendations pending EGD. See op report for further details  Esophagogastroduodenoscopy with possible biopsy, control of bleeding, polypectomy, and interventions as necessary has been discussed with the patient/patient representative. Informed consent was obtained from the patient/patient representative after explaining the indication, nature, and risks of the procedure including but not limited to death, bleeding, perforation, missed neoplasm/lesions, cardiorespiratory compromise, and reaction to medications. Opportunity for questions was given and appropriate answers were provided. Patient/patient representative has verbalized understanding is amenable to undergoing the procedure.   I personally performed the service.  Management of other medical comorbidities as per primary team  Thank you for allowing [place] to participate in this patient's care. Please  don't hesitate to call if any questions or concerns arise.   Annamaria Helling, DO [facility]  Portions of the record may have been created with voice recognition software. Occasional wrong-word or 'sound-a-like' substitutions may have occurred due to the inherent limitations of voice recognition software.  Read the chart carefully and recognize, using context, where substitutions may have occurred.

## 2021-11-21 NOTE — Discharge Summary (Addendum)
Physician Discharge Summary   Patient: Daniel Knapp MRN: [id number] DOB: August 29, 1950  Admit date:     11/20/2021  Discharge date: 11/23/2021  Discharge Physician: Ezekiel Slocumb   PCP: Derinda Late, MD   Recommendations at discharge:   [facility]  Discharge Diagnoses: Principal Problem:   Symptomatic anemia Active Problems:   HTN (hypertension)   Dementia ([place])   Hepatitis   Hypokalemia   Fall   Iron deficiency anemia   Hospital Course: 71 y.o. male with hypertension, rectal bleeding Alzheimer's dementia, history of hepatitis C, compensated liver cirrhosis admitted for severe symptomatic anemia concerning for GI bleed with a hemoglobin of 3.7 status post 4 PRBC transfusion  6/13: EGD showed fairly large gastric varix that looks like it has recently bled.  Will need BRTO procedure under IR.  Waiting for transfer at [facility].  6/14: Case discussed with GI.  They have been in talks with specialists including multiple radiologist at [facility] regarding best plan of care for patient, plan is yet to be determined but all options would still require transfer.  Awaiting bed.  6/15 pm: pt remains medically stable for transfer to [facility], bed available and expect transport this evening.    Assessment and Plan: * Symptomatic anemia Patient has a history of hepatitis C with liver cirrhosis and was brought in by EMS for evaluation of weakness and lethargy. Patient was found to have a hemoglobin of 3.7g/dl compared to baseline 7.4g/dl a month ago.   Status post 4 PRBC transfusion. Per his sister she found evidence of hematemesis in his apartment. S/p EGD by Dr. 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 He will need transfer for BRTO at [facility] Dr. Virgina Jock has updated patient's sister.   --Continue protonix and octreotide drips as well as the abx.  Patient on transfer waitlist since 6/13 for [facility] for BRTO procedure IR Dr. Earleen Newport and GI Dr.  Carlean Purl has graciously excepted for intervention.  GI has been in discussion with specialist at [facility], patient may need splenectomy.    To be admitted under hospitalist/TRH service after transfer.    HTN (hypertension) Hold amlodipine for now since patient is normotensive.  Dementia ([place]) Continue Aricept and Namenda Patient will need increased nursing care due to his underlying history of dementia.  Iron deficiency anemia Acute anemia due to acute blood loss.  Iron studies however show severe iron deficiency. -- IV iron infusion today, likely further infusions this admission -- Follow CBC -- Transfuse if hemoglobin less than 7 or actively bleeding  Fall Status post fall from weakness related to anemia Maintain fall precautions CK 369  Hypokalemia Repleted and resolved  Hepatitis Treatment as outlined in 1         Consultants: GI Procedures performed: EGD Disposition:  [facility] Diet recommendation:  Discharge Diet Orders (From admission, onward)     Start     Ordered   11/21/21 0000  Diet - low sodium heart healthy        11/21/21 1421            DISCHARGE MEDICATION: Allergies as of 11/23/2021   No Known Allergies      Medication List     STOP taking these medications    ferrous sulfate 325 (65 FE) MG tablet   QUEtiapine 25 MG tablet Commonly known as: SEROQUEL       TAKE these medications    amLODipine 10 MG tablet  Commonly known as: NORVASC Take 10 mg by mouth daily in the afternoon.   atenolol 25 MG tablet Commonly known as: TENORMIN Take 25 mg by mouth daily.   divalproex 250 MG 24 hr tablet Commonly known as: DEPAKOTE ER Take 250 mg by mouth daily in the afternoon.   donepezil 10 MG tablet Commonly known as: ARICEPT Take 10 mg by mouth daily in the afternoon.   finasteride 5 MG tablet Commonly known as: PROSCAR Take 5 mg by mouth daily in the afternoon. Reported on 08/17/2015   melatonin 1 MG Tabs  tablet Take 12 mg by mouth at bedtime.   memantine 5 MG tablet Commonly known as: NAMENDA Take 5 mg by mouth 2 (two) times daily.   mouth rinse Liqd solution 15 mLs by Mouth Rinse route in the morning, at noon, in the evening, and at bedtime.   multivitamin with minerals Tabs tablet Take 1 tablet by mouth daily in the afternoon.   octreotide 500 mcg in sodium chloride 0.9 % 250 mL Inject 50 mcg/hr into the vein continuous.   pantoprazole 80 mg/100 mL (0.8 mg/mL) Soln Commonly known as: PROTONIX Inject 8 mg/hr into the vein continuous.   tamsulosin 0.4 MG Caps capsule Commonly known as: FLOMAX Take 0.4 mg by mouth daily in the afternoon. Reported on 09/01/2015   vitamin B-12 1000 MCG tablet Commonly known as: CYANOCOBALAMIN Take 1,000 mcg by mouth daily in the afternoon.         Discharge Exam: Filed Weights   11/20/21 0917 11/20/21 2000  Weight: 90.7 kg 1 kg   71 year old male lying in the bed without any acute distress Lungs clear to auscultation bilaterally Cardiovascular regular rate and rhythm Eyes: Conjunctival pallor present Abdomen soft, benign Neuro: Nonfocal, awake.  Not oriented likely at baseline Skin no rash or lesion Psychiatry unable to assess  Condition at discharge: fair  The results of significant diagnostics from this hospitalization (including imaging, microbiology, ancillary and laboratory) are listed below for reference.   Imaging Studies: CT Angio Abd/Pel w/ and/or w/o  Result Date: 11/21/2021 CLINICAL DATA:  Severe symptomatic anemia, compensated cirrhosis EXAM: CTA ABDOMEN AND PELVIS WITHOUT AND WITH CONTRAST TECHNIQUE: Multidetector CT imaging of the abdomen and pelvis was performed using the standard protocol during bolus administration of intravenous contrast. Multiplanar reconstructed images and MIPs were obtained and reviewed to evaluate the vascular anatomy. RADIATION DOSE REDUCTION: This exam was performed according to the  departmental dose-optimization program which includes automated exposure control, adjustment of the mA and/or kV according to patient size and/or use of iterative reconstruction technique. CONTRAST:  111m OMNIPAQUE IOHEXOL 350 MG/ML SOLN COMPARISON:  Prior CT abdomen/pelvis 02/21/2016 FINDINGS: VASCULAR Aorta: Normal caliber aorta without aneurysm, dissection, vasculitis or significant stenosis. Celiac: Patent without evidence of aneurysm, dissection, vasculitis or significant stenosis. Lateral segmental branch of the left hepatic artery is replaced to the left gastric artery. SMA: Patent without evidence of aneurysm, dissection, vasculitis or significant stenosis. Renals: Both main renal arteries are patent without evidence of aneurysm, dissection, vasculitis, fibromuscular dysplasia or significant stenosis. Accessory left lower pole renal artery. IMA: Patent without evidence of aneurysm, dissection, vasculitis or significant stenosis. Inflow: Patent without evidence of aneurysm, dissection, vasculitis or significant stenosis. Proximal Outflow: Bilateral common femoral and visualized portions of the superficial and profunda femoral arteries are patent without evidence of aneurysm, dissection, vasculitis or significant stenosis. Veins: No focal venous abnormality. Review of the MIP images confirms the above findings. NON-VASCULAR Lower chest: Trace atherosclerotic calcifications  along the coronary arteries. Dependent atelectasis in both lower lobes. Additionally, there are is mild focal ground-glass attenuation airspace opacity in the dependent aspect of the left lower lobe. No pneumothorax. No suspicious nodule. Hepatobiliary: No focal liver abnormality is seen. No gallstones, gallbladder wall thickening, or biliary dilatation. Pancreas: Abnormal appearance of the pancreatic tail which demonstrates inflammatory stranding which is now contiguous with the splenic parenchyma. Additionally, there is focal wedge-shaped  hypoattenuation within the spleen extending from the pancreatic tail. Trace fluid identified in the peripancreatic soft tissues and extending into the left pericolic gutter. Spleen: Wedge-shaped hypoattenuation extending from the splenic hilum where it abuts the pancreatic tail to the spleen surface consistent with a small region of infarct. Adrenals/Urinary Tract: Normal adrenal glands. Punctate nonobstructing stone in the interpolar right kidney measures up to 4 mm. Punctate nonobstructing stone in the lower pole of the left kidney measures 3 mm. Ureters are unremarkable. Similar appearance of prostatomegaly with nodular extension into the bladder trigone. Stomach/Bowel: Colonic diverticular disease without CT evidence of active inflammation. No evidence of obstruction or focal bowel wall thickening. Normal appendix in the right lower quadrant. The terminal ileum is unremarkable. Lymphatic: Right para-aortic lymphadenopathy. A cluster of nodes measuring 1.3, 1.71.5 cm in short axis are visible in the right para aortic space (image 120 series 5). Left internal iliac station lymph node is enlarged at approximately 1.4 cm in short axis (image 176 series 5). Reproductive: Similar appearance of prostatomegaly with nodular extension into the right bladder trigone. Other: Repaired fat containing umbilical hernia. Trace ascites in the region of the pancreatic tail extending into the left pericolic gutter. Trace free fluid visible in the pelvis is well. Musculoskeletal: No acute or significant osseous findings. Advanced lumbar degenerative disc disease and facet arthropathy. IMPRESSION: VASCULAR 1. No evidence of active bleeding. 2. No evidence of aneurysm, dissection or other acute vascular abnormality. NON-VASCULAR 1. Abnormal appearance of the pancreatic tail which is now inseparable from the adjacent and abutting splenic parenchyma. There is inflammatory stranding in the region as well as slight interval enlargement of  a dystrophic calcification compared to prior imaging from 2017. Further, there is new wedge-shaped infarct in the same region. Differential considerations include focal pancreatitis of the pancreatic tail with involvement of the spleen including a small splenic infarct versus pancreatic tail neoplasm with direct splenic invasion. Small volume reactive fluid in the peripancreatic soft tissues extending along the left pericolic gutter and low within the anatomic pelvis. 2. Abnormal retroperitoneal lymphadenopathy predominantly in the right para aortic space. Differential considerations include lymphoma versus metastatic prostate or bladder cancer. 3. Similar appearance of nodular extension of the prostate gland into the right bladder trigone compared to prior imaging from 02/21/2016. If anything, findings are slightly less conspicuous than previously seen. 4. Colonic diverticular disease without CT evidence of active inflammation. 5. Bilateral nonobstructing nephrolithiasis. 6. Additional ancillary findings as above. Electronically Signed   By: Jacqulynn Cadet M.D.   On: 11/21/2021 16:52   US Abdomen Limited  Result Date: 11/20/2021 CLINICAL DATA:  Ascites, cirrhosis. EXAM: LIMITED ABDOMEN ULTRASOUND FOR ASCITES TECHNIQUE: Limited ultrasound survey for ascites was performed in all four abdominal quadrants. COMPARISON:  None Available. FINDINGS: Four-quadrant ultrasound demonstrates no significant abdominal ascites. IMPRESSION: No significant abdominal ascites. Electronically Signed   By: San Morelle M.D.   On: 11/20/2021 14:34   CT Cervical Spine Wo Contrast  Result Date: 11/20/2021 CLINICAL DATA:  Trauma EXAM: CT CERVICAL SPINE WITHOUT CONTRAST TECHNIQUE: Multidetector CT imaging of  the cervical spine was performed without intravenous contrast. Multiplanar CT image reconstructions were also generated. RADIATION DOSE REDUCTION: This exam was performed according to the departmental dose-optimization  program which includes automated exposure control, adjustment of the mA and/or kV according to patient size and/or use of iterative reconstruction technique. COMPARISON:  None Available. FINDINGS: Alignment: Degenerative reversal of the normal cervical lordosis. Skull base and vertebrae: No acute fracture. No primary bone lesion or focal pathologic process. Soft tissues and spinal canal: No prevertebral fluid or swelling. No visible canal hematoma. Disc levels: Moderate to severe disc space height loss and anterior osteophytosis of C5 through T1, with otherwise mild disc space height loss and osteophytosis of the upper cervical levels. Upper chest: Negative. Other: None. IMPRESSION: 1. No acute fracture or static subluxation of the cervical spine. 2. Moderate to severe disc space height loss and anterior osteophytosis of C5 through T1, with associated degenerative reversal of the normal cervical lordosis. Electronically Signed   By: Delanna Ahmadi M.D.   On: 11/20/2021 10:35   CT HEAD WO CONTRAST (5MM)  Result Date: 11/20/2021 CLINICAL DATA:  Altered mental status, nontraumatic (Ped 0-17y) EXAM: CT HEAD WITHOUT CONTRAST TECHNIQUE: Contiguous axial images were obtained from the base of the skull through the vertex without intravenous contrast. RADIATION DOSE REDUCTION: This exam was performed according to the departmental dose-optimization program which includes automated exposure control, adjustment of the mA and/or kV according to patient size and/or use of iterative reconstruction technique. COMPARISON:  MRI head April 16, 2018. FINDINGS: Brain: No evidence of acute infarction, hemorrhage, hydrocephalus, extra-axial collection or mass lesion/mass effect. Cerebral atrophy. Vascular: No hyperdense vessel identified. Calcific intracranial atherosclerosis. Skull: No acute fracture. Sinuses/Orbits: Largely clear sinuses.  No acute orbital findings. Other: No mastoid effusions. IMPRESSION: 1. No evidence of acute  intracranial abnormality. 2.  Cerebral atrophy (ICD10-G31.9). Electronically Signed   By: Margaretha Sheffield M.D.   On: 11/20/2021 10:31    Microbiology: Results for orders placed or performed in visit on 09/01/15  Microscopic Examination     Status: Abnormal   Collection Time: 09/01/15 10:15 AM   URINE  Result Value Ref Range Status   WBC, UA >30 (H) 0 - 5 /hpf Final   RBC, UA >30 (H) 0 - 2 /hpf Final   Epithelial Cells (non renal) 0-10 0 - 10 /hpf Final   Bacteria, UA Many (A) None seen/Few Final  CULTURE, URINE COMPREHENSIVE     Status: Abnormal   Collection Time: 09/01/15 10:30 AM   Specimen: Urine  Result Value Ref Range Status   Urine Culture, Comprehensive Final report (A)  Final   Organism ID, Bacteria Serratia marcescens (A)  Final    Comment: Greater than 100,000 colony forming units per mL   ANTIMICROBIAL SUSCEPTIBILITY Comment  Final    Comment:       ** S = Susceptible; I = Intermediate; R = Resistant **                    P = Positive; N = Negative             MICS are expressed in micrograms per mL    Antibiotic                 RSLT#1    RSLT#2    RSLT#3    RSLT#4 Amoxicillin/Clavulanic Acid    R Cefazolin  R Cefepime                       S Ceftriaxone                    S Cefuroxime                     R Cephalothin                    R Ciprofloxacin                  S Ertapenem                      S Gentamicin                     S Imipenem                       S Levofloxacin                   S Nitrofurantoin                 R Piperacillin                   S Tetracycline                   R Tobramycin                     I Trimethoprim/Sulfa             S     Labs: CBC: Recent Labs  Lab 11/20/21 0922 11/20/21 1700 11/22/21 0541 11/22/21 1002 11/22/21 2156 11/23/21 0144 11/23/21 0853  WBC 9.4  --  5.5  --   --   --   --   HGB 3.7*   < > 7.7* 7.5* 7.3* 7.5* 7.3*  HCT 13.4*   < > 24.3* 23.4* 24.0* 24.7* 23.6*  MCV 81.2  --   83.5  --   --   --   --   PLT 131*  --  126*  --   --   --   --    < > = values in this interval not displayed.   Basic Metabolic Panel: Recent Labs  Lab 11/20/21 0922 11/21/21 0502 11/22/21 0541 11/23/21 0144  NA 145 144 141 144  K 3.2* 3.5 3.3* 3.5  CL 116* 110 108 111  CO2 '22 28 28 29  '$ GLUCOSE 113* 113* 110* 107*  BUN 25* '16 18 21  '$ CREATININE 0.94 0.93 1.14 1.15  CALCIUM 8.3* 8.7* 8.6* 8.5*  MG 1.9  --   --   --    Liver Function Tests: Recent Labs  Lab 11/20/21 0922 11/21/21 0502  AST 22 28  ALT 13 15  ALKPHOS 43 54  BILITOT 0.5 1.3*  PROT 6.8 7.1  ALBUMIN 3.1* 3.3*   CBG: No results for input(s): "GLUCAP" in the last 168 hours.  Discharge time spent: greater than 30 minutes.  Signed: Ezekiel Slocumb, DO [facility] 11/23/2021

## 2021-11-21 NOTE — Plan of Care (Signed)

## 2021-11-21 NOTE — Progress Notes (Signed)
IVT consulted for PIV placement.  RN notified IVT pt in CT.  Asked RN to place new consult when pt back on unit.

## 2021-11-21 NOTE — Care Plan (Signed)
Brief GI Post op note See procedure note for further recs Keep protonix and octreotide gtt going Keep ceftriaxone Maintain npo status  Pt with gastric varix with signs of recent bleeding Discussed with hospitalist, outside facility GI and IR for possible BRTO CT[facility] to be performed either at [facility] or [facility] pending how long it will take to transfer Sister/POA updated by phone Nursing updated  Annamaria Helling, [facility]

## 2021-11-21 NOTE — Progress Notes (Signed)
Inpatient Follow-up/Progress Note   Patient ID: Daniel Knapp is a 71 y.o. male.  Overnight Events / Subjective Findings Pt required 1 additional u prbc this morning to help get him >7g. No o/n signs of gib- nursing denies emesis or further bowel movements. Vital signs have remained stable. Patient denies abdominal pain. More interactive today. NPO since midnight. Plan for EGD today.  Spoke with patient's sister/POA, Daniel Knapp, and she would like the patient to undergo colonoscopy while here. She is agreeable to placing the NGT during the procedure today for prep administration.  Review of Systems  Unable to perform ROS: Dementia  Gastrointestinal:  Negative for abdominal pain.     Medications  Current Facility-Administered Medications:    0.9 %  sodium chloride infusion (Manually program via Guardrails IV Fluids), , Intravenous, Once, Foust, Katy L, NP   0.9 %  sodium chloride infusion, , Intravenous, Continuous, Croley, Granville M, PA-C   acetaminophen (TYLENOL) tablet 650 mg, 650 mg, Oral, Q6H PRN **OR** acetaminophen (TYLENOL) suppository 650 mg, 650 mg, Rectal, Q6H PRN, Agbata, Tochukwu, MD   cefTRIAXone (ROCEPHIN) 2 g in sodium chloride 0.9 % 100 mL IVPB, 2 g, Intravenous, Q24H, Agbata, Tochukwu, MD   divalproex (DEPAKOTE ER) 24 hr tablet 250 mg, 250 mg, Oral, Q1500, Agbata, Tochukwu, MD   donepezil (ARICEPT) tablet 10 mg, 10 mg, Oral, QHS, Agbata, Tochukwu, MD, 10 mg at 11/20/21 2152   finasteride (PROSCAR) tablet 5 mg, 5 mg, Oral, Q1500, Agbata, Tochukwu, MD   memantine (NAMENDA) tablet 5 mg, 5 mg, Oral, BID, Agbata, Tochukwu, MD, 5 mg at 11/20/21 2152   multivitamin with minerals tablet 1 tablet, 1 tablet, Oral, Q1500, Agbata, Tochukwu, MD   [COMPLETED] octreotide (SANDOSTATIN) 2 mcg/mL load via infusion 50 mcg, 50 mcg, Intravenous, Once, 50 mcg at 11/20/21 1136 **AND** octreotide (SANDOSTATIN) 500 mcg in sodium chloride 0.9 % 250 mL (2 mcg/mL) infusion, 50 mcg/hr,  Intravenous, Continuous, Agbata, Tochukwu, MD, Last Rate: 25 mL/hr at 11/21/21 0651, 50 mcg/hr at 11/21/21 0651   ondansetron (ZOFRAN) tablet 4 mg, 4 mg, Oral, Q6H PRN **OR** ondansetron (ZOFRAN) injection 4 mg, 4 mg, Intravenous, Q6H PRN, Agbata, Tochukwu, MD   [START ON 11/23/2021] pantoprazole (PROTONIX) injection 40 mg, 40 mg, Intravenous, Q12H, Agbata, Tochukwu, MD   pantoprozole (PROTONIX) 80 mg /NS 100 mL infusion, 8 mg/hr, Intravenous, Continuous, Agbata, Tochukwu, MD, Last Rate: 10 mL/hr at 11/21/21 0651, 8 mg/hr at 11/21/21 0651   tamsulosin (FLOMAX) capsule 0.4 mg, 0.4 mg, Oral, QPM, Agbata, Tochukwu, MD, 0.4 mg at 11/20/21 2152   vitamin B-12 (CYANOCOBALAMIN) tablet 1,000 mcg, 1,000 mcg, Oral, Q1500, Agbata, Tochukwu, MD  sodium chloride     cefTRIAXone (ROCEPHIN)  IV     octreotide (SANDOSTATIN) 500 mcg in sodium chloride 0.9 % 250 mL (2 mcg/mL) infusion 50 mcg/hr (11/21/21 0651)   pantoprazole 8 mg/hr (11/21/21 0651)    acetaminophen **OR** acetaminophen, ondansetron **OR** ondansetron (ZOFRAN) IV   Objective    Vitals:   11/21/21 0246 11/21/21 0434 11/21/21 0621 11/21/21 0653  BP: 109/69 115/65 120/63 114/78  Pulse: 75 66 64 72  Resp: '16 18 16 14  '$ Temp: 98.4 F (36.9 C) 98 F (36.7 C) 98.3 F (36.8 C) 98 F (36.7 C)  TempSrc: Oral  Oral Oral  SpO2: 99% 100% 100% 100%  Weight:      Height:         Physical Exam Vitals and nursing note reviewed.  Constitutional:  General: He is not in acute distress.    Appearance: He is ill-appearing (chronically). He is not toxic-appearing or diaphoretic.  HENT:     Head: Normocephalic and atraumatic.     Nose: Nose normal.     Mouth/Throat:     Mouth: Mucous membranes are moist.     Pharynx: Oropharynx is clear.     Comments: Poor dentition Eyes:     General: No scleral icterus.    Extraocular Movements: Extraocular movements intact.  Cardiovascular:     Rate and Rhythm: Normal rate and regular rhythm.     Heart  sounds: Normal heart sounds. No murmur heard.    No friction rub. No gallop.  Pulmonary:     Effort: Pulmonary effort is normal. No respiratory distress.     Breath sounds: Normal breath sounds. No wheezing, rhonchi or rales.  Abdominal:     General: Bowel sounds are normal. There is no distension.     Palpations: Abdomen is soft.     Tenderness: There is no abdominal tenderness. There is no guarding or rebound.  Musculoskeletal:     Cervical back: Neck supple.     Right lower leg: Edema present.     Left lower leg: Edema present.  Skin:    General: Skin is warm and dry.     Coloration: Skin is not jaundiced.  Neurological:     Mental Status: He is alert.     Comments: Seems to be improving towards baseline. Mostly non-communicative     Laboratory Data Recent Labs  Lab 11/20/21 0922 11/20/21 1700 11/21/21 0502  WBC 9.4  --   --   HGB 3.7* 5.2* 6.5*  HCT 13.4* 18.1* 20.9*  PLT 131*  --   --    Recent Labs  Lab 11/20/21 0922 11/21/21 0502  NA 145 144  K 3.2* 3.5  CL 116* 110  CO2 22 28  BUN 25* 16  CREATININE 0.94 0.93  CALCIUM 8.3* 8.7*  PROT 6.8 7.1  BILITOT 0.5 1.3*  ALKPHOS 43 54  ALT 13 15  AST 22 28  GLUCOSE 113* 113*   Recent Labs  Lab 11/20/21 1056 11/21/21 0502  INR 1.4* 1.3*      Imaging Studies: US Abdomen Limited  Result Date: 11/20/2021 CLINICAL DATA:  Ascites, cirrhosis. EXAM: LIMITED ABDOMEN ULTRASOUND FOR ASCITES TECHNIQUE: Limited ultrasound survey for ascites was performed in all four abdominal quadrants. COMPARISON:  None Available. FINDINGS: Four-quadrant ultrasound demonstrates no significant abdominal ascites. IMPRESSION: No significant abdominal ascites. Electronically Signed   By: San Morelle M.D.   On: 11/20/2021 14:34   CT Cervical Spine Wo Contrast  Result Date: 11/20/2021 CLINICAL DATA:  Trauma EXAM: CT CERVICAL SPINE WITHOUT CONTRAST TECHNIQUE: Multidetector CT imaging of the cervical spine was performed without  intravenous contrast. Multiplanar CT image reconstructions were also generated. RADIATION DOSE REDUCTION: This exam was performed according to the departmental dose-optimization program which includes automated exposure control, adjustment of the mA and/or kV according to patient size and/or use of iterative reconstruction technique. COMPARISON:  None Available. FINDINGS: Alignment: Degenerative reversal of the normal cervical lordosis. Skull base and vertebrae: No acute fracture. No primary bone lesion or focal pathologic process. Soft tissues and spinal canal: No prevertebral fluid or swelling. No visible canal hematoma. Disc levels: Moderate to severe disc space height loss and anterior osteophytosis of C5 through T1, with otherwise mild disc space height loss and osteophytosis of the upper cervical levels. Upper chest: Negative. Other: None.  IMPRESSION: 1. No acute fracture or static subluxation of the cervical spine. 2. Moderate to severe disc space height loss and anterior osteophytosis of C5 through T1, with associated degenerative reversal of the normal cervical lordosis. Electronically Signed   By: Delanna Ahmadi M.D.   On: 11/20/2021 10:35   CT HEAD WO CONTRAST (5MM)  Result Date: 11/20/2021 CLINICAL DATA:  Altered mental status, nontraumatic (Ped 0-17y) EXAM: CT HEAD WITHOUT CONTRAST TECHNIQUE: Contiguous axial images were obtained from the base of the skull through the vertex without intravenous contrast. RADIATION DOSE REDUCTION: This exam was performed according to the departmental dose-optimization program which includes automated exposure control, adjustment of the mA and/or kV according to patient size and/or use of iterative reconstruction technique. COMPARISON:  MRI head April 16, 2018. FINDINGS: Brain: No evidence of acute infarction, hemorrhage, hydrocephalus, extra-axial collection or mass lesion/mass effect. Cerebral atrophy. Vascular: No hyperdense vessel identified. Calcific  intracranial atherosclerosis. Skull: No acute fracture. Sinuses/Orbits: Largely clear sinuses.  No acute orbital findings. Other: No mastoid effusions. IMPRESSION: 1. No evidence of acute intracranial abnormality. 2.  Cerebral atrophy (ICD10-G31.9). Electronically Signed   By: Margaretha Sheffield M.D.   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 No further signs of bleeding o/n per nursing NPO currently Morning labs reviewed Plan for egd today Protonix 40 mg iv q12 h - currently on gtt per primary Hold dvt ppx Octreotide bolus and gtt for 72 hours total (currently on) Ceftriaxone 1 g iv q24 for 7 days total for sbp ppx Monitor H&H.  Transfusion and resuscitation as per primary team Avoid frequent lab draws to prevent lab induced anemia Supportive care and antiemetics as per primary team Maintain two sites IV access Avoid nsaids Monitor for GIB.  No ascites noted on [place] Recommend iv iron infusions while in the hospital HCV testing pending  Spoke with sister/poa, Daniel Knapp, on the phone this morning. She is agreeable to NGT being placed during endoscopy today to allow for colonoscopy prep administration. Potential colonoscopy tomorrow pending patient prep.  Further  recommendations pending EGD. See op report for further details  Esophagogastroduodenoscopy with possible biopsy, control of bleeding, polypectomy, and interventions as necessary has been discussed with the patient/patient representative. Informed consent was obtained from the patient/patient representative after explaining the indication, nature, and risks of the procedure including but not limited to death, bleeding, perforation, missed neoplasm/lesions, cardiorespiratory compromise, and reaction to medications. Opportunity for questions was given and appropriate answers were provided. Patient/patient representative has verbalized understanding is amenable to undergoing the procedure.   I personally performed the service.  Management of other medical comorbidities as per primary team  Thank you for allowing [place] to participate in this patient's care. Please  don't hesitate to call if any questions or concerns arise.   Annamaria Helling, DO [facility]  Portions of the record may have been created with voice recognition software. Occasional wrong-word or 'sound-a-like' substitutions may have occurred due to the inherent limitations of voice recognition software.  Read the chart carefully and recognize, using context, where substitutions may have occurred.

## 2021-11-21 NOTE — Op Note (Signed)
[facility] Patient Name: Daniel Knapp Procedure Date: 11/21/2021 12:31 PM MRN: [id number] Account #: [id number] Date of Birth: 1951-02-22 Admit Type: Inpatient Age: 71 Room: [facility] ENDO ROOM 2 Gender: Male Note Status: Finalized Instrument Name: Upper Endoscope [id number] Procedure:             Upper GI endoscopy Indications:           Gastrointestinal bleeding Providers:             Annamaria Helling DO, DO Referring MD:          Caprice Renshaw MD (Referring MD) Medicines:             Monitored Anesthesia Care Complications:         No immediate complications. Estimated blood loss: None. Procedure:             Pre-Anesthesia Assessment:                        - Prior to the procedure, a History and Physical was                         performed, and patient medications and allergies were                         reviewed. The patient is unable to give consent                         secondary to the patient being legally incompetent to                         consent. The risks and benefits of the procedure and                         the sedation options and risks were discussed with the                         patient's sister. All questions were answered and                         informed consent was obtained. Patient identification                         and proposed procedure were verified by the physician,                         the nurse, the anesthetist and the technician in the                         endoscopy suite. Mental Status Examination: alert but                         confused. Airway Examination: normal oropharyngeal                         airway and neck mobility. Respiratory Examination:                         clear to auscultation. CV Examination: regular rate  and rhythm. Prophylactic Antibiotics: The patient                         requires prophylactic antibiotics due to a prior                          history of acute GI bleeding and cirrhosis. Prior                         Anticoagulants: The patient has taken no previous                         anticoagulant or antiplatelet agents. ASA Grade                         Assessment: III - A patient with severe systemic                         disease. After reviewing the risks and benefits, the                         patient was deemed in satisfactory condition to                         undergo the procedure. The anesthesia plan was to use                         monitored anesthesia care (MAC). Immediately prior to                         administration of medications, the patient was                         re-assessed for adequacy to receive sedatives. The                         heart rate, respiratory rate, oxygen saturations,                         blood pressure, adequacy of pulmonary ventilation, and                         response to care were monitored throughout the                         procedure. The physical status of the patient was                         re-assessed after the procedure.                        After obtaining informed consent, the endoscope was                         passed under direct vision. Throughout the procedure,                         the patient's blood pressure, pulse, and oxygen  saturations were monitored continuously. The Endoscope                         was introduced through the mouth, and advanced to the                         second part of duodenum. The upper GI endoscopy was                         accomplished without difficulty. The patient tolerated                         the procedure well. Findings:      The duodenal bulb, first portion of the duodenum and second portion of       the duodenum were normal. Estimated blood loss: none.      Type 1 isolated gastric varices (IGV1, varices located in the fundus)       with no bleeding were  found in the cardia. There were stigmata of recent       bleeding- red wale sign. no nipple sign. They were medium in largest       diameter. Estimated blood loss: none.      The Z-line was regular. Estimated blood loss: none.      Esophagogastric landmarks were identified: the gastroesophageal junction       was found at 40 cm from the incisors.      The exam of the esophagus was otherwise normal.      No sign of esophageal varices Impression:            - Normal duodenal bulb, first portion of the duodenum                         and second portion of the duodenum.                        - Type 1 isolated gastric varices (IGV1, varices                         located in the fundus), without bleeding.                        - Z-line regular.                        - Esophagogastric landmarks identified.                        - No specimens collected. Recommendation:        - Return patient to hospital ward for ongoing care.                        - Transfer patient to another hospital.                        - hospital with BRTO capabilities                        - NPO.                        -  Continue present medications.                        - Continue protonix and octreotide gtt as well as                         antibiotics                        - Refer to an interventional radiologist.                        - The findings and recommendations were discussed with                         the referring physician.                        - The findings and recommendations were discussed with                         the patient's family- [name] over the                         phone. Procedure Code(s):     --- Professional ---                        [phone], Esophagogastroduodenoscopy, flexible,                         transoral; diagnostic, including collection of                         specimen(s) by brushing or washing, when performed                          (separate procedure) Diagnosis Code(s):     --- Professional ---                        I86.4, Gastric varices                        K92.2, Gastrointestinal hemorrhage, unspecified CPT copyright 2019 [employer]. All rights reserved. The codes documented in this report are preliminary and upon coder review may  be revised to meet current compliance requirements. Attending Participation:      I personally performed the entire procedure. Volney American, DO Annamaria Helling DO, DO 11/21/2021 1:10:24 PM This report has been signed electronically. Number of Addenda: 0 Note Initiated On: 11/21/2021 12:31 PM Estimated Blood Loss:  Estimated blood loss: none.      [facility]

## 2021-11-21 NOTE — Transfer of Care (Signed)
Immediate Anesthesia Transfer of Care Note  Patient: Daniel Knapp  Procedure(s) Performed: ESOPHAGOGASTRODUODENOSCOPY (EGD)  Patient Location: PACU and Endoscopy Unit  Anesthesia Type:General  Level of Consciousness: drowsy and patient cooperative  Airway & Oxygen Therapy: Patient Spontanous Breathing  Post-op Assessment: Report given to RN and Post -op Vital signs reviewed and stable  Post vital signs: Reviewed and stable  Last Vitals:  Vitals Value Taken Time  BP 86/62 11/21/21 1252  Temp 36.1 C 11/21/21 1252  Pulse 84 11/21/21 1252  Resp 13 11/21/21 1252  SpO2 99 % 11/21/21 1252    Last Pain:  Vitals:   11/21/21 1252  TempSrc: Temporal  PainSc: Asleep         Complications: No notable events documented.

## 2021-11-22 ENCOUNTER — Encounter: Payer: Self-pay | Admitting: Gastroenterology

## 2021-11-22 DIAGNOSIS — D649 Anemia, unspecified: Secondary | ICD-10-CM | POA: Diagnosis not present

## 2021-11-22 DIAGNOSIS — Z789 Other specified health status: Secondary | ICD-10-CM | POA: Diagnosis not present

## 2021-11-22 DIAGNOSIS — G309 Alzheimer's disease, unspecified: Secondary | ICD-10-CM | POA: Diagnosis not present

## 2021-11-22 DIAGNOSIS — Z515 Encounter for palliative care: Secondary | ICD-10-CM

## 2021-11-22 LAB — TYPE AND SCREEN
ABO/RH(D): O POS
Antibody Screen: NEGATIVE
Unit division: 0
Unit division: 0
Unit division: 0
Unit division: 0

## 2021-11-22 LAB — CBC
HCT: 24.3 % — ABNORMAL LOW (ref 39.0–52.0)
Hemoglobin: 7.7 g/dL — ABNORMAL LOW (ref 13.0–17.0)
MCH: 26.5 pg (ref 26.0–34.0)
MCHC: 31.7 g/dL (ref 30.0–36.0)
MCV: 83.5 fL (ref 80.0–100.0)
Platelets: 126 10*3/uL — ABNORMAL LOW (ref 150–400)
RBC: 2.91 MIL/uL — ABNORMAL LOW (ref 4.22–5.81)
RDW: 17.3 % — ABNORMAL HIGH (ref 11.5–15.5)
WBC: 5.5 10*3/uL (ref 4.0–10.5)
nRBC: 0 % (ref 0.0–0.2)

## 2021-11-22 LAB — BASIC METABOLIC PANEL
Anion gap: 5 (ref 5–15)
BUN: 18 mg/dL (ref 8–23)
CO2: 28 mmol/L (ref 22–32)
Calcium: 8.6 mg/dL — ABNORMAL LOW (ref 8.9–10.3)
Chloride: 108 mmol/L (ref 98–111)
Creatinine, Ser: 1.14 mg/dL (ref 0.61–1.24)
GFR, Estimated: >60 mL/min (ref >60)
Glucose, Bld: 110 mg/dL — ABNORMAL HIGH (ref 70–99)
Potassium: 3.3 mmol/L — ABNORMAL LOW (ref 3.5–5.1)
Sodium: 141 mmol/L (ref 135–145)

## 2021-11-22 LAB — HEMOGLOBIN AND HEMATOCRIT, BLOOD
HCT: 23.6 % — ABNORMAL LOW (ref 39.0–52.0)
HCT: 23.4 % — ABNORMAL LOW (ref 39.0–52.0)
HCT: 24 % — ABNORMAL LOW (ref 39.0–52.0)
Hemoglobin: 7.5 g/dL — ABNORMAL LOW (ref 13.0–17.0)
Hemoglobin: 7.5 g/dL — ABNORMAL LOW (ref 13.0–17.0)
Hemoglobin: 7.3 g/dL — ABNORMAL LOW (ref 13.0–17.0)

## 2021-11-22 LAB — BPAM RBC
Blood Product Expiration Date: 202306132359
Blood Product Expiration Date: 202306282359
Blood Product Expiration Date: 202306282359
Blood Product Expiration Date: 202306282359
ISSUE DATE / TIME: 202306121219
ISSUE DATE / TIME: 202306121829
ISSUE DATE / TIME: 202306130007
ISSUE DATE / TIME: 202306130633
Unit Type and Rh: 5100
Unit Type and Rh: 5100
Unit Type and Rh: 5100
Unit Type and Rh: 9500

## 2021-11-22 MED ORDER — POTASSIUM CHLORIDE CRYS ER 20 MEQ PO TBCR
40.0000 meq | EXTENDED_RELEASE_TABLET | Freq: Once | ORAL | Status: AC
  Administered 2021-11-22: 40 meq via ORAL
  Filled 2021-11-22: qty 2

## 2021-11-22 NOTE — Progress Notes (Signed)
Inpatient Follow-up/Progress Note   Patient ID: Daniel Knapp is a 71 y.o. male.  Overnight Events / Subjective Findings NAEON. No further emesis- hematemesis or coffee ground emesis. Per nursing has not had bowel movements. Pt is more alert today. Hgb stable. VSS Currently awaiting transfer  Review of Systems  Unable to perform ROS: Dementia  Gastrointestinal:  Negative for abdominal pain.     Medications  Current Facility-Administered Medications:    acetaminophen (TYLENOL) tablet 650 mg, 650 mg, Oral, Q6H PRN **OR** acetaminophen (TYLENOL) suppository 650 mg, 650 mg, Rectal, Q6H PRN, Agbata, Tochukwu, MD   cefTRIAXone (ROCEPHIN) 2 g in sodium chloride 0.9 % 100 mL IVPB, 2 g, Intravenous, Q24H, Agbata, Tochukwu, MD, Last Rate: 200 mL/hr at 11/22/21 1050, 2 g at 11/22/21 1050   divalproex (DEPAKOTE ER) 24 hr tablet 250 mg, 250 mg, Oral, Q1500, Agbata, Tochukwu, MD, 250 mg at 11/21/21 1543   donepezil (ARICEPT) tablet 10 mg, 10 mg, Oral, QHS, Agbata, Tochukwu, MD, 10 mg at 11/20/21 2152   finasteride (PROSCAR) tablet 5 mg, 5 mg, Oral, Q1500, Agbata, Tochukwu, MD   memantine (NAMENDA) tablet 5 mg, 5 mg, Oral, BID, Agbata, Tochukwu, MD, 5 mg at 11/20/21 2152   multivitamin with minerals tablet 1 tablet, 1 tablet, Oral, Q1500, Agbata, Tochukwu, MD   [COMPLETED] octreotide (SANDOSTATIN) 2 mcg/mL load via infusion 50 mcg, 50 mcg, Intravenous, Once, 50 mcg at 11/20/21 1136 **AND** octreotide (SANDOSTATIN) 500 mcg in sodium chloride 0.9 % 250 mL (2 mcg/mL) infusion, 50 mcg/hr, Intravenous, Continuous, Agbata, Tochukwu, MD, Last Rate: 25 mL/hr at 11/22/21 1041, 50 mcg/hr at 11/22/21 1041   ondansetron (ZOFRAN) tablet 4 mg, 4 mg, Oral, Q6H PRN **OR** ondansetron (ZOFRAN) injection 4 mg, 4 mg, Intravenous, Q6H PRN, Agbata, Tochukwu, MD   [START ON 11/23/2021] pantoprazole (PROTONIX) injection 40 mg, 40 mg, Intravenous, Q12H, Agbata, Tochukwu, MD   pantoprozole (PROTONIX) 80 mg /NS 100 mL  infusion, 8 mg/hr, Intravenous, Continuous, Agbata, Tochukwu, MD, Last Rate: 10 mL/hr at 11/22/21 1039, 8 mg/hr at 11/22/21 1039   tamsulosin (FLOMAX) capsule 0.4 mg, 0.4 mg, Oral, QPM, Agbata, Tochukwu, MD, 0.4 mg at 11/20/21 2152   vitamin B-12 (CYANOCOBALAMIN) tablet 1,000 mcg, 1,000 mcg, Oral, Q1500, Agbata, Tochukwu, MD, 1,000 mcg at 11/21/21 1543  cefTRIAXone (ROCEPHIN)  IV 2 g (11/22/21 1050)   octreotide (SANDOSTATIN) 500 mcg in sodium chloride 0.9 % 250 mL (2 mcg/mL) infusion 50 mcg/hr (11/22/21 1041)   pantoprazole 8 mg/hr (11/22/21 1039)    acetaminophen **OR** acetaminophen, ondansetron **OR** ondansetron (ZOFRAN) IV   Objective    Vitals:   11/22/21 0016 11/22/21 0457 11/22/21 0725 11/22/21 1120  BP: 113/68 112/67 107/66 112/64  Pulse: 74 60 63 64  Resp: '18 16 20 18  '$ Temp: 98.1 F (36.7 C) 97.7 F (36.5 C) 98.1 F (36.7 C) 98.5 F (36.9 C)  TempSrc:   Oral Oral  SpO2: 100% 97% 99% 97%  Weight:      Height:         Physical Exam Vitals and nursing note reviewed.  Constitutional:      General: He is not in acute distress.    Appearance: He is ill-appearing (chronically). He is not toxic-appearing or diaphoretic.  HENT:     Head: Normocephalic and atraumatic.     Nose: Nose normal.     Mouth/Throat:     Mouth: Mucous membranes are moist.     Pharynx: Oropharynx is clear.     Comments: Poor dentition  Eyes:     General: No scleral icterus.    Extraocular Movements: Extraocular movements intact.  Cardiovascular:     Rate and Rhythm: Normal rate and regular rhythm.     Heart sounds: Normal heart sounds. No murmur heard.    No friction rub. No gallop.  Pulmonary:     Effort: Pulmonary effort is normal. No respiratory distress.     Breath sounds: Normal breath sounds. No wheezing, rhonchi or rales.  Abdominal:     General: Bowel sounds are normal. There is no distension.     Palpations: Abdomen is soft.     Tenderness: There is no abdominal tenderness. There  is no guarding or rebound.  Musculoskeletal:     Cervical back: Neck supple.     Right lower leg: Edema present.     Left lower leg: Edema present.  Skin:    General: Skin is warm and dry.     Coloration: Skin is not jaundiced.  Neurological:     Mental Status: He is alert.     Comments: More alert today.     Laboratory Data Recent Labs  Lab 11/20/21 0922 11/20/21 1700 11/22/21 0045 11/22/21 0541 11/22/21 1002  WBC 9.4  --   --  5.5  --   HGB 3.7*   < > 7.5* 7.7* 7.5*  HCT 13.4*   < > 23.6* 24.3* 23.4*  PLT 131*  --   --  126*  --    < > = values in this interval not displayed.    Recent Labs  Lab 11/20/21 0922 11/21/21 0502 11/22/21 0541  NA 145 144 141  K 3.2* 3.5 3.3*  CL 116* 110 108  CO2 '22 28 28  '$ BUN 25* 16 18  CREATININE 0.94 0.93 1.14  CALCIUM 8.3* 8.7* 8.6*  PROT 6.8 7.1  --   BILITOT 0.5 1.3*  --   ALKPHOS 43 54  --   ALT 13 15  --   AST 22 28  --   GLUCOSE 113* 113* 110*    Recent Labs  Lab 11/20/21 1056 11/21/21 0502  INR 1.4* 1.3*       Imaging Studies: CT Angio Abd/Pel w/ and/or w/o  Result Date: 11/21/2021 CLINICAL DATA:  Severe symptomatic anemia, compensated cirrhosis EXAM: CTA ABDOMEN AND PELVIS WITHOUT AND WITH CONTRAST TECHNIQUE: Multidetector CT imaging of the abdomen and pelvis was performed using the standard protocol during bolus administration of intravenous contrast. Multiplanar reconstructed images and MIPs were obtained and reviewed to evaluate the vascular anatomy. RADIATION DOSE REDUCTION: This exam was performed according to the departmental dose-optimization program which includes automated exposure control, adjustment of the mA and/or kV according to patient size and/or use of iterative reconstruction technique. CONTRAST:  167m OMNIPAQUE IOHEXOL 350 MG/ML SOLN COMPARISON:  Prior CT abdomen/pelvis 02/21/2016 FINDINGS: VASCULAR Aorta: Normal caliber aorta without aneurysm, dissection, vasculitis or significant stenosis.  Celiac: Patent without evidence of aneurysm, dissection, vasculitis or significant stenosis. Lateral segmental branch of the left hepatic artery is replaced to the left gastric artery. SMA: Patent without evidence of aneurysm, dissection, vasculitis or significant stenosis. Renals: Both main renal arteries are patent without evidence of aneurysm, dissection, vasculitis, fibromuscular dysplasia or significant stenosis. Accessory left lower pole renal artery. IMA: Patent without evidence of aneurysm, dissection, vasculitis or significant stenosis. Inflow: Patent without evidence of aneurysm, dissection, vasculitis or significant stenosis. Proximal Outflow: Bilateral common femoral and visualized portions of the superficial and profunda femoral arteries are patent without evidence  of aneurysm, dissection, vasculitis or significant stenosis. Veins: No focal venous abnormality. Review of the MIP images confirms the above findings. NON-VASCULAR Lower chest: Trace atherosclerotic calcifications along the coronary arteries. Dependent atelectasis in both lower lobes. Additionally, there are is mild focal ground-glass attenuation airspace opacity in the dependent aspect of the left lower lobe. No pneumothorax. No suspicious nodule. Hepatobiliary: No focal liver abnormality is seen. No gallstones, gallbladder wall thickening, or biliary dilatation. Pancreas: Abnormal appearance of the pancreatic tail which demonstrates inflammatory stranding which is now contiguous with the splenic parenchyma. Additionally, there is focal wedge-shaped hypoattenuation within the spleen extending from the pancreatic tail. Trace fluid identified in the peripancreatic soft tissues and extending into the left pericolic gutter. Spleen: Wedge-shaped hypoattenuation extending from the splenic hilum where it abuts the pancreatic tail to the spleen surface consistent with a small region of infarct. Adrenals/Urinary Tract: Normal adrenal glands. Punctate  nonobstructing stone in the interpolar right kidney measures up to 4 mm. Punctate nonobstructing stone in the lower pole of the left kidney measures 3 mm. Ureters are unremarkable. Similar appearance of prostatomegaly with nodular extension into the bladder trigone. Stomach/Bowel: Colonic diverticular disease without CT evidence of active inflammation. No evidence of obstruction or focal bowel wall thickening. Normal appendix in the right lower quadrant. The terminal ileum is unremarkable. Lymphatic: Right para-aortic lymphadenopathy. A cluster of nodes measuring 1.3, 1.71.5 cm in short axis are visible in the right para aortic space (image 120 series 5). Left internal iliac station lymph node is enlarged at approximately 1.4 cm in short axis (image 176 series 5). Reproductive: Similar appearance of prostatomegaly with nodular extension into the right bladder trigone. Other: Repaired fat containing umbilical hernia. Trace ascites in the region of the pancreatic tail extending into the left pericolic gutter. Trace free fluid visible in the pelvis is well. Musculoskeletal: No acute or significant osseous findings. Advanced lumbar degenerative disc disease and facet arthropathy. IMPRESSION: VASCULAR 1. No evidence of active bleeding. 2. No evidence of aneurysm, dissection or other acute vascular abnormality. NON-VASCULAR 1. Abnormal appearance of the pancreatic tail which is now inseparable from the adjacent and abutting splenic parenchyma. There is inflammatory stranding in the region as well as slight interval enlargement of a dystrophic calcification compared to prior imaging from 2017. Further, there is new wedge-shaped infarct in the same region. Differential considerations include focal pancreatitis of the pancreatic tail with involvement of the spleen including a small splenic infarct versus pancreatic tail neoplasm with direct splenic invasion. Small volume reactive fluid in the peripancreatic soft tissues  extending along the left pericolic gutter and low within the anatomic pelvis. 2. Abnormal retroperitoneal lymphadenopathy predominantly in the right para aortic space. Differential considerations include lymphoma versus metastatic prostate or bladder cancer. 3. Similar appearance of nodular extension of the prostate gland into the right bladder trigone compared to prior imaging from 02/21/2016. If anything, findings are slightly less conspicuous than previously seen. 4. Colonic diverticular disease without CT evidence of active inflammation. 5. Bilateral nonobstructing nephrolithiasis. 6. Additional ancillary findings as above. Electronically Signed   By: Jacqulynn Cadet M.D.   On: 11/21/2021 16:52   US Abdomen Limited  Result Date: 11/20/2021 CLINICAL DATA:  Ascites, cirrhosis. EXAM: LIMITED ABDOMEN ULTRASOUND FOR ASCITES TECHNIQUE: Limited ultrasound survey for ascites was performed in all four abdominal quadrants. COMPARISON:  None Available. FINDINGS: Four-quadrant ultrasound demonstrates no significant abdominal ascites. IMPRESSION: No significant abdominal ascites. Electronically Signed   By: San Morelle M.D.   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 which would be probable etiology of isolated gastric varix - splenectomy would be treatment of choice, but may not feasible in patient with dementia and cirrhosis - endoscopic treatment with glue may be option (since there were stigmata of recent bleeding) which is not available at this facility  # severe symptomatic anemia with severe iron deficiency - p/w hgb 3.7- up to 7.5 after 4th unit - ferritin 4 and sat 2% - Hemoglobin one month ago was 7.4 and was 10 eighteen months ago  # GI Bleeding- 2/2 gastric varix - egd on 11/21/21-  redf wale signs noted on gastric varix; no other signs of ugib - pt with melena/hematemesis and increased bun as well as clotted emesis - he did have some BRBPR but had hemorrhoids and radiation for prostate CA  # Abnormal CT imaging -concerning Abnormal retroperitoneal lymphadenopathy and pancreatic tail abnormality - pt does have h/o prostate CA  # 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  Transfusion and resuscitation as per primary team Avoid frequent lab draws to prevent lab induced anemia Supportive care and antiemetics as per primary team Maintain two sites IV access Avoid nsaids Monitor for GIB.  No ascites noted on [place] Recommend iv iron infusions while in the hospital HCV testing pending  Given advanced dementia, defer oncologic w/u to primary team and family desires  No plan for colonoscopy  Would recommend palliative care involvement at this time for further goals of care discussion with family members  I personally performed the service.  Management of other medical comorbidities as per primary team  Thank you for allowing [place] to participate in this patient's care. Please don't hesitate to call if any questions or concerns arise.   Annamaria Helling, DO [facility]  Portions of the record may have been created with voice recognition software. Occasional wrong-word or  'sound-a-like' substitutions may have occurred due to the inherent limitations of voice recognition software.  Read the chart carefully and recognize, using context, where substitutions may have occurred.

## 2021-11-22 NOTE — Progress Notes (Signed)
Progress Note   Patient: Daniel Knapp FBP:[id number] DOB: Nov 17, 1950 DOA: 11/20/2021     2 DOS: the patient was seen and examined on 11/22/2021   Brief hospital course: 71 y.o. male with hypertension, rectal bleeding Alzheimer's dementia, history of hepatitis C, compensated liver cirrhosis admitted for severe symptomatic anemia concerning for GI bleed with a hemoglobin of 3.7 status post 4 PRBC transfusion  6/13: EGD showed fairly large gastric varix that looks like it has recently bled.  Will need BRTO procedure under IR.  Waiting for transfer at [facility]  6/14: Case discussed with GI.  They have been in talks with specialists including multiple radiologist at [facility] regarding best plan of care for patient, plan is yet to be determined but all options would still require transfer.  Awaiting bed.   Assessment and Plan: * Symptomatic anemia Patient has a history of hepatitis C with liver cirrhosis and was brought in by EMS for evaluation of weakness and lethargy. Patient was found to have a hemoglobin of 3.7g/dl compared to baseline 7.4g/dl a month ago.  Status post 4 PRBC transfusion Per his sister she found evidence of hematemesis in his apartment S/p EGD by Dr. 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 He will need transfer for BRTO at [facility] protonix and octreotide drips as well as the abx. Dr. Virgina Jock has updated I will reach out to the patient's sister.  I have placed him on transfer waitlist at [facility] for BRTO procedure IR Dr. Earleen Newport and GI Dr. Carlean Purl has graciously excepted for intervention.  He will be admitted under hospitalist/TRH service Getting CT angio abdomen and pelvis per GI/IR request while waiting for the bed  HTN (hypertension) Hold amlodipine for now since patient is normotensive.  Dementia ([place]) Continue Aricept and Namenda Patient will need increased nursing care due to his underlying history of  dementia.  Fall Status post fall from weakness related to anemia Maintain fall precautions CK 369  Hypokalemia Repleted and resolved  Hepatitis Treatment as outlined in 1        Subjective: Patient was attempting to sit up and get out of bed when seen on rounds, nurse was entering to encourage him to lay back down.  He appears confused and is nonverbal during the encounter but appears in no distress.  No acute rash reported.  Discussed case with GI and transfer to [facility] remains the plan  Physical Exam: Vitals:   11/22/21 0016 11/22/21 0457 11/22/21 0725 11/22/21 1120  BP: 113/68 112/67 107/66 112/64  Pulse: 74 60 63 64  Resp: '18 16 20 18  '$ Temp: 98.1 F (36.7 C) 97.7 F (36.5 C) 98.1 F (36.7 C) 98.5 F (36.9 C)  TempSrc:   Oral Oral  SpO2: 100% 97% 99% 97%  Weight:      Height:       71 year old male lying in the bed without any acute distress, restless attempting to get out of bed nonverbal Respiratory: CTA B, no wheezes or rhonchi, normal respiratory effort Cardiovascular RRR, no peripheral edema Eyes: Conjunctival pallor noted Abdomen soft, nontender nondistended Neuro: Nonverbal, otherwise grossly nonfocal neuro exam, moves all extremities nonfocal, awake. Skin no rash or lesion seen on visualized skin Psychiatry unable to assess patient nonverbal, appears restless attempting to get a bed  Data Reviewed:  Notable labs: Hemoglobin 7.5 from 7.7 overall stable, platelets 126, potassium 3.3, glucose 110, calcium 8.6  Family Communication: None at bedside on rounds.  Sister was updated after EGD yesterday  Disposition: Status is: Inpatient Remains inpatient appropriate because: Treatment of GI bleed and potential transfer waiting for [facility] for possible BRTO procedure under IR   Planned Discharge Destination:  [place] when bed available     Time spent: 35 minutes  Author: Ezekiel Slocumb, DO 11/22/2021 1:52 PM  For on call  review [email or web address].

## 2021-11-22 NOTE — Plan of Care (Signed)

## 2021-11-22 NOTE — Consult Note (Signed)
Consultation Note Date: 11/22/2021   Patient Name: Daniel Knapp  DOB: 1950-12-27  MRN: [id number]  Age / Sex: 71 y.o., male  PCP: Derinda Late, MD Referring Physician: Ezekiel Slocumb, DO  Reason for Consultation: Establishing goals of care  HPI/Patient Profile: 71 y.o. male  with past medical history of HTN, rectal bleeding, Alzheimer's dementia, hep C, and compensated liver cirrhosis admitted on 11/20/2021 with severe symptomatic anemia with concern for GI bleed.  Hemoglobin on arrival was 3.7.  Patient received 4 PRBC transfusions.  On 6/13, patient had EGD which showed fairly large gastric varix.  BRTO procedure under IR is recommended and unable to be performed at this facility.  Patient is awaiting transfer to [facility].  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  Splenectomy is being considered but may not be appropriate given patient's dementia and cirrhosis.  Endoscopic treatment with glue is also being discussed as form of treatment.  Neither of these options are available so transfer to [facility] is needed.  PMT was consulted to discuss goals of care.   Clinical Assessment and Goals of Care: I have reviewed medical records including EPIC notes, labs and imaging, assessed the patient and then met with patient and his Sister [name] at bedside to discuss diagnosis prognosis, [place], EOL wishes, disposition and options.  I introduced Palliative Medicine as specialized medical care for people living with serious illness. It focuses on providing relief from the symptoms and stress of a serious illness. The goal is to improve quality of life for both the patient and the family.  We discussed a brief life review of the patient.  Patient worked as a Development worker, international aid.  Patient was never married and has no children.  Patient is close with his Sister [name] and brother Pilar Plate.  As far as  functional and nutritional status [name] endorses a significant decline in patient's cognitive and functional abilities over the past 3 to 4 months.  [name] says she had to take his keys as it is no longer safe for him to drive. Patient no longer cooks for himself.  However, [name], caregiver, and other family were frequently checking on patient he was only alone during the evening hours while he slept.  We discussed patient's current illness and what it means in the larger context of patient's on-going co-morbidities.  I attempted to elicit values and goals of care important to the patient.  [name] shares that she is the patient's HCPOA.  She believes he would want to accept all offered and available, appropriate medical interventions to sustain his life.  She also believes he would want to remain a full code.  [name] shares that should he be placed on a ventilator he would not want to live on it long-term.  We discussed that ventilatory support is used as a bridge to support ones lungs.  Reviewed this does not reverse or improve patient's underlying chronic, progressive Alzheimer's dementia.  [name] verbalized understanding that she wants everything to be done for  him but appreciates that nothing is going to change the natural disease trajectory of his dementia.  Full code remains with understanding that patient would not want to live long-term with tracheostomy or ventilatory support.  Reviewed that patient will be transferred to [facility] for further evaluation of procedure to address bleeding.  Discussed patient's history of alcohol abuse contributing to his liver cirrhosis is being a limiting factor for some procedures.    Discussed with patient/family the importance of continued conversation with family and the medical providers regarding overall plan of care and treatment options, ensuring decisions are within the context of the patient's values and GOCs.    Questions and concerns were addressed. The  family was encouraged to call with questions or concerns.   Primary Decision Maker NEXT OF KIN  Code Status/Advance Care Planning: Full code  Prognosis:   Unable to determine  Discharge Planning: awaiting transfer to [facility]  Primary Diagnoses: Present on Admission:  Symptomatic anemia  HTN (hypertension)  Dementia (HCC)  Hepatitis  Hypokalemia  Fall   Physical Exam Vitals and nursing note reviewed.  Constitutional:      General: He is not in acute distress.    Appearance: Normal appearance. He is normal weight. He is not ill-appearing.  HENT:     Mouth/Throat:     Mouth: Mucous membranes are moist.  Eyes:     Pupils: Pupils are equal, round, and reactive to light.  Cardiovascular:     Rate and Rhythm: Normal rate.     Pulses: Normal pulses.  Pulmonary:     Effort: Pulmonary effort is normal.  Abdominal:     Palpations: Abdomen is soft.  Musculoskeletal:     Comments: Generalized weakness  Skin:    General: Skin is warm and dry.  Neurological:     Mental Status: He is alert.     Comments: Minimally interactive, responds to name and verbalized yes and no, but stares off frequently during interaction  Psychiatric:        Behavior: Behavior normal.     Palliative Assessment/Data: 50%     I discussed this patient's plan of care with patient, patient's sister [name].  Thank you for this consult. Palliative medicine will continue to follow and assist holistically.   Time Total: 75 minutes Greater than 50%  of this time was spent counseling and coordinating care related to the above assessment and plan.  Signed by:  , DNP, FNP-BC Palliative Medicine    Please contact Palliative Medicine Team phone at [phone] for questions and concerns.  For individual provider: See Amion               

## 2021-11-23 ENCOUNTER — Inpatient Hospital Stay (HOSPITAL_COMMUNITY)
Admission: AD | Admit: 2021-11-23 | Discharge: 2021-11-29 | DRG: 300 | Disposition: A | Discharge status: Skilled Nursing Facility | Payer: Medicare HMO | Source: Other Acute Inpatient Hospital | Attending: Internal Medicine | Admitting: Internal Medicine

## 2021-11-23 ENCOUNTER — Encounter (HOSPITAL_COMMUNITY): Payer: Self-pay | Admitting: Family Medicine

## 2021-11-23 DIAGNOSIS — K920 Hematemesis: Secondary | ICD-10-CM | POA: Diagnosis not present

## 2021-11-23 DIAGNOSIS — N201 Calculus of ureter: Secondary | ICD-10-CM | POA: Diagnosis not present

## 2021-11-23 DIAGNOSIS — N400 Enlarged prostate without lower urinary tract symptoms: Secondary | ICD-10-CM | POA: Diagnosis not present

## 2021-11-23 DIAGNOSIS — Z8249 Family history of ischemic heart disease and other diseases of the circulatory system: Secondary | ICD-10-CM | POA: Diagnosis not present

## 2021-11-23 DIAGNOSIS — D684 Acquired coagulation factor deficiency: Secondary | ICD-10-CM | POA: Diagnosis present

## 2021-11-23 DIAGNOSIS — R948 Abnormal results of function studies of other organs and systems: Secondary | ICD-10-CM | POA: Diagnosis not present

## 2021-11-23 DIAGNOSIS — K7469 Other cirrhosis of liver: Secondary | ICD-10-CM

## 2021-11-23 DIAGNOSIS — D735 Infarction of spleen: Secondary | ICD-10-CM | POA: Diagnosis not present

## 2021-11-23 DIAGNOSIS — R4182 Altered mental status, unspecified: Secondary | ICD-10-CM | POA: Diagnosis not present

## 2021-11-23 DIAGNOSIS — I10 Essential (primary) hypertension: Secondary | ICD-10-CM | POA: Diagnosis not present

## 2021-11-23 DIAGNOSIS — D61818 Other pancytopenia: Secondary | ICD-10-CM | POA: Diagnosis present

## 2021-11-23 DIAGNOSIS — K922 Gastrointestinal hemorrhage, unspecified: Secondary | ICD-10-CM | POA: Diagnosis not present

## 2021-11-23 DIAGNOSIS — R1310 Dysphagia, unspecified: Secondary | ICD-10-CM | POA: Diagnosis not present

## 2021-11-23 DIAGNOSIS — Z79899 Other long term (current) drug therapy: Secondary | ICD-10-CM | POA: Diagnosis not present

## 2021-11-23 DIAGNOSIS — D509 Iron deficiency anemia, unspecified: Secondary | ICD-10-CM | POA: Diagnosis present

## 2021-11-23 DIAGNOSIS — F02C0 Dementia in other diseases classified elsewhere, severe, without behavioral disturbance, psychotic disturbance, mood disturbance, and anxiety: Secondary | ICD-10-CM | POA: Diagnosis present

## 2021-11-23 DIAGNOSIS — C61 Malignant neoplasm of prostate: Secondary | ICD-10-CM | POA: Diagnosis not present

## 2021-11-23 DIAGNOSIS — K921 Melena: Secondary | ICD-10-CM | POA: Diagnosis present

## 2021-11-23 DIAGNOSIS — I864 Gastric varices: Secondary | ICD-10-CM | POA: Diagnosis not present

## 2021-11-23 DIAGNOSIS — Z8546 Personal history of malignant neoplasm of prostate: Secondary | ICD-10-CM

## 2021-11-23 DIAGNOSIS — F02818 Dementia in other diseases classified elsewhere, unspecified severity, with other behavioral disturbance: Secondary | ICD-10-CM

## 2021-11-23 DIAGNOSIS — D696 Thrombocytopenia, unspecified: Secondary | ICD-10-CM | POA: Diagnosis not present

## 2021-11-23 DIAGNOSIS — F0390 Unspecified dementia without behavioral disturbance: Secondary | ICD-10-CM | POA: Diagnosis not present

## 2021-11-23 DIAGNOSIS — R933 Abnormal findings on diagnostic imaging of other parts of digestive tract: Secondary | ICD-10-CM | POA: Diagnosis not present

## 2021-11-23 DIAGNOSIS — B182 Chronic viral hepatitis C: Secondary | ICD-10-CM | POA: Diagnosis present

## 2021-11-23 DIAGNOSIS — G9341 Metabolic encephalopathy: Secondary | ICD-10-CM | POA: Diagnosis not present

## 2021-11-23 DIAGNOSIS — K7460 Unspecified cirrhosis of liver: Secondary | ICD-10-CM | POA: Diagnosis not present

## 2021-11-23 DIAGNOSIS — G309 Alzheimer's disease, unspecified: Secondary | ICD-10-CM | POA: Diagnosis not present

## 2021-11-23 DIAGNOSIS — D649 Anemia, unspecified: Secondary | ICD-10-CM | POA: Diagnosis not present

## 2021-11-23 DIAGNOSIS — M6281 Muscle weakness (generalized): Secondary | ICD-10-CM | POA: Diagnosis not present

## 2021-11-23 DIAGNOSIS — Z809 Family history of malignant neoplasm, unspecified: Secondary | ICD-10-CM

## 2021-11-23 DIAGNOSIS — Z7401 Bed confinement status: Secondary | ICD-10-CM | POA: Diagnosis not present

## 2021-11-23 LAB — BASIC METABOLIC PANEL
Anion gap: 4 — ABNORMAL LOW (ref 5–15)
BUN: 21 mg/dL (ref 8–23)
CO2: 29 mmol/L (ref 22–32)
Calcium: 8.5 mg/dL — ABNORMAL LOW (ref 8.9–10.3)
Chloride: 111 mmol/L (ref 98–111)
Creatinine, Ser: 1.15 mg/dL (ref 0.61–1.24)
GFR, Estimated: >60 mL/min (ref >60)
Glucose, Bld: 107 mg/dL — ABNORMAL HIGH (ref 70–99)
Potassium: 3.5 mmol/L (ref 3.5–5.1)
Sodium: 144 mmol/L (ref 135–145)

## 2021-11-23 LAB — HEMOGLOBIN AND HEMATOCRIT, BLOOD
HCT: 24.7 % — ABNORMAL LOW (ref 39.0–52.0)
HCT: 23.6 % — ABNORMAL LOW (ref 39.0–52.0)
Hemoglobin: 7.5 g/dL — ABNORMAL LOW (ref 13.0–17.0)
Hemoglobin: 7.3 g/dL — ABNORMAL LOW (ref 13.0–17.0)

## 2021-11-23 LAB — HCV RNA BY PCR, QN RFX GENO
HCV RNA Qnt(log copy/mL): UNDETERMINED log10 IU/mL
HepC Qn: NOT DETECTED IU/mL

## 2021-11-23 MED ORDER — SODIUM CHLORIDE 0.9 % IV SOLN: INTRAVENOUS | Status: DC | PRN

## 2021-11-23 MED ORDER — SODIUM CHLORIDE 0.9% FLUSH
3.0000 mL | Freq: Two times a day (BID) | INTRAVENOUS | Status: DC
Start: 2021-11-24 — End: 2021-11-29
  Administered 2021-11-24 – 2021-11-29 (×11): 3 mL via INTRAVENOUS

## 2021-11-23 MED ORDER — SODIUM CHLORIDE 0.9 % IV SOLN
200.0000 mg | Freq: Once | INTRAVENOUS | Status: AC
  Administered 2021-11-23: 200 mg via INTRAVENOUS
  Filled 2021-11-23: qty 200

## 2021-11-23 MED ORDER — SODIUM CHLORIDE 0.9 % IV SOLN
2.0000 g | INTRAVENOUS | Status: DC
  Administered 2021-11-24 – 2021-11-25 (×2): 2 g via INTRAVENOUS
  Filled 2021-11-23 (×2): qty 20

## 2021-11-23 MED ORDER — PANTOPRAZOLE SODIUM 40 MG IV SOLR
40.0000 mg | Freq: Two times a day (BID) | INTRAVENOUS | Status: DC
  Administered 2021-11-24 – 2021-11-25 (×5): 40 mg via INTRAVENOUS
  Filled 2021-11-23 (×5): qty 10

## 2021-11-23 MED ORDER — ORAL CARE MOUTH RINSE: 15.0000 mL | OROMUCOSAL | Status: DC

## 2021-11-23 MED ORDER — ORAL CARE MOUTH RINSE
15.0000 mL | OROMUCOSAL | Status: DC | PRN
Start: 2021-11-23 — End: 2021-11-23

## 2021-11-23 MED ORDER — ORAL CARE MOUTH RINSE
15.0000 mL | Freq: Four times a day (QID) | OROMUCOSAL | 0 refills | Status: DC
Start: 2021-11-23 — End: 2021-11-27

## 2021-11-23 MED ORDER — KCL IN DEXTROSE-NACL 10-5-0.45 MEQ/L-%-% IV SOLN
INTRAVENOUS | Status: AC
  Filled 2021-11-23 (×2): qty 1000

## 2021-11-23 MED ORDER — DIVALPROEX SODIUM 125 MG PO CSDR
250.0000 mg | DELAYED_RELEASE_CAPSULE | Freq: Every day | ORAL | Status: DC
  Administered 2021-11-23: 250 mg via ORAL
  Filled 2021-11-23: qty 2

## 2021-11-23 MED ORDER — SODIUM CHLORIDE 0.9 % IV SOLN
50.0000 ug/h | INTRAVENOUS | Status: DC
  Administered 2021-11-24 – 2021-11-26 (×5): 50 ug/h via INTRAVENOUS
  Filled 2021-11-23 (×5): qty 1

## 2021-11-23 NOTE — Progress Notes (Signed)
Inpatient Follow-up/Progress Note   Patient ID: Daniel Knapp is a 71 y.o. male.  Overnight Events / Subjective Findings NAEON. Status unchanged. Awaiting transfer. No further emesis- hematemesis or coffee ground emesis. Hgb stable. VSS  Review of Systems  Unable to perform ROS: Dementia  Gastrointestinal:  Negative for abdominal pain.     Medications  Current Facility-Administered Medications:    0.9 %  sodium chloride infusion, , Intravenous, PRN, Ezekiel Slocumb, DO, Stopped at 11/23/21 1041   acetaminophen (TYLENOL) tablet 650 mg, 650 mg, Oral, Q6H PRN **OR** acetaminophen (TYLENOL) suppository 650 mg, 650 mg, Rectal, Q6H PRN, Agbata, Tochukwu, MD   cefTRIAXone (ROCEPHIN) 2 g in sodium chloride 0.9 % 100 mL IVPB, 2 g, Intravenous, Q24H, Agbata, Tochukwu, MD, Stopped at 11/23/21 1112   divalproex (DEPAKOTE ER) 24 hr tablet 250 mg, 250 mg, Oral, Q1500, Agbata, Tochukwu, MD, 250 mg at 11/22/21 1514   donepezil (ARICEPT) tablet 10 mg, 10 mg, Oral, QHS, Agbata, Tochukwu, MD, 10 mg at 11/22/21 2251   finasteride (PROSCAR) tablet 5 mg, 5 mg, Oral, Q1500, Agbata, Tochukwu, MD, 5 mg at 11/23/21 1421   iron sucrose (VENOFER) 200 mg in sodium chloride 0.9 % 100 mL IVPB, 200 mg, Intravenous, Once, Nicole Kindred A, DO   memantine (NAMENDA) tablet 5 mg, 5 mg, Oral, BID, Agbata, Tochukwu, MD, 5 mg at 11/23/21 0957   multivitamin with minerals tablet 1 tablet, 1 tablet, Oral, Q1500, Agbata, Tochukwu, MD, 1 tablet at 11/23/21 1421   [COMPLETED] octreotide (SANDOSTATIN) 2 mcg/mL load via infusion 50 mcg, 50 mcg, Intravenous, Once, 50 mcg at 11/20/21 1136 **AND** octreotide (SANDOSTATIN) 500 mcg in sodium chloride 0.9 % 250 mL (2 mcg/mL) infusion, 50 mcg/hr, Intravenous, Continuous, Agbata, Tochukwu, MD, Last Rate: 25 mL/hr at 11/23/21 1422, 50 mcg/hr at 11/23/21 1422   ondansetron (ZOFRAN) tablet 4 mg, 4 mg, Oral, Q6H PRN **OR** ondansetron (ZOFRAN) injection 4 mg, 4 mg, Intravenous, Q6H PRN,  Agbata, Tochukwu, MD   pantoprazole (PROTONIX) injection 40 mg, 40 mg, Intravenous, Q12H, Agbata, Tochukwu, MD   tamsulosin (FLOMAX) capsule 0.4 mg, 0.4 mg, Oral, QPM, Agbata, Tochukwu, MD, 0.4 mg at 11/22/21 1726   vitamin B-12 (CYANOCOBALAMIN) tablet 1,000 mcg, 1,000 mcg, Oral, Q1500, Agbata, Tochukwu, MD, 1,000 mcg at 11/23/21 1421  sodium chloride Stopped (11/23/21 1041)   cefTRIAXone (ROCEPHIN)  IV Stopped (11/23/21 1112)   iron sucrose     octreotide (SANDOSTATIN) 500 mcg in sodium chloride 0.9 % 250 mL (2 mcg/mL) infusion 50 mcg/hr (11/23/21 1422)    sodium chloride, acetaminophen **OR** acetaminophen, ondansetron **OR** ondansetron (ZOFRAN) IV   Objective    Vitals:   11/23/21 0019 11/23/21 0400 11/23/21 0729 11/23/21 1135  BP: 113/73 109/77 104/63 114/66  Pulse: 67 (!) 53 (!) 54 (!) 55  Resp: '17 16 17 16  '$ Temp: 98.6 F (37 C) 97.8 F (36.6 C) 98.9 F (37.2 C) 98.7 F (37.1 C)  TempSrc: Oral Oral Axillary Axillary  SpO2: 100% 96% 99% 99%  Weight:      Height:         Physical Exam Vitals and nursing note reviewed.  Constitutional:      General: He is not in acute distress.    Appearance: He is ill-appearing (chronically). He is not toxic-appearing or diaphoretic.  HENT:     Head: Normocephalic and atraumatic.     Nose: Nose normal.     Mouth/Throat:     Mouth: Mucous membranes are moist.  Pharynx: Oropharynx is clear.     Comments: Poor dentition Eyes:     General: No scleral icterus.    Extraocular Movements: Extraocular movements intact.  Cardiovascular:     Rate and Rhythm: Normal rate and regular rhythm.     Heart sounds: Normal heart sounds. No murmur heard.    No friction rub. No gallop.  Pulmonary:     Effort: Pulmonary effort is normal. No respiratory distress.     Breath sounds: Normal breath sounds. No wheezing, rhonchi or rales.  Abdominal:     General: Bowel sounds are normal. There is no distension.     Palpations: Abdomen is soft.      Tenderness: There is no abdominal tenderness. There is no guarding or rebound.  Musculoskeletal:     Cervical back: Neck supple.     Right lower leg: Edema present.     Left lower leg: Edema present.  Skin:    General: Skin is warm and dry.     Coloration: Skin is not jaundiced.  Neurological:     Mental Status: He is alert.     Laboratory Data Recent Labs  Lab 11/20/21 0922 11/20/21 1700 11/22/21 0541 11/22/21 1002 11/22/21 2156 11/23/21 0144 11/23/21 0853  WBC 9.4  --  5.5  --   --   --   --   HGB 3.7*   < > 7.7*   < > 7.3* 7.5* 7.3*  HCT 13.4*   < > 24.3*   < > 24.0* 24.7* 23.6*  PLT 131*  --  126*  --   --   --   --    < > = values in this interval not displayed.    Recent Labs  Lab 11/20/21 0922 11/21/21 0502 11/22/21 0541 11/23/21 0144  NA 145 144 141 144  K 3.2* 3.5 3.3* 3.5  CL 116* 110 108 111  CO2 '22 28 28 29  '$ BUN 25* '16 18 21  '$ CREATININE 0.94 0.93 1.14 1.15  CALCIUM 8.3* 8.7* 8.6* 8.5*  PROT 6.8 7.1  --   --   BILITOT 0.5 1.3*  --   --   ALKPHOS 43 54  --   --   ALT 13 15  --   --   AST 22 28  --   --   GLUCOSE 113* 113* 110* 107*    Recent Labs  Lab 11/20/21 1056 11/21/21 0502  INR 1.4* 1.3*       Imaging Studies: CT Angio Abd/Pel w/ and/or w/o  Result Date: 11/21/2021 CLINICAL DATA:  Severe symptomatic anemia, compensated cirrhosis EXAM: CTA ABDOMEN AND PELVIS WITHOUT AND WITH CONTRAST TECHNIQUE: Multidetector CT imaging of the abdomen and pelvis was performed using the standard protocol during bolus administration of intravenous contrast. Multiplanar reconstructed images and MIPs were obtained and reviewed to evaluate the vascular anatomy. RADIATION DOSE REDUCTION: This exam was performed according to the departmental dose-optimization program which includes automated exposure control, adjustment of the mA and/or kV according to patient size and/or use of iterative reconstruction technique. CONTRAST:  155m OMNIPAQUE IOHEXOL 350 MG/ML SOLN  COMPARISON:  Prior CT abdomen/pelvis 02/21/2016 FINDINGS: VASCULAR Aorta: Normal caliber aorta without aneurysm, dissection, vasculitis or significant stenosis. Celiac: Patent without evidence of aneurysm, dissection, vasculitis or significant stenosis. Lateral segmental branch of the left hepatic artery is replaced to the left gastric artery. SMA: Patent without evidence of aneurysm, dissection, vasculitis or significant stenosis. Renals: Both main renal arteries are patent without evidence of aneurysm,  dissection, vasculitis, fibromuscular dysplasia or significant stenosis. Accessory left lower pole renal artery. IMA: Patent without evidence of aneurysm, dissection, vasculitis or significant stenosis. Inflow: Patent without evidence of aneurysm, dissection, vasculitis or significant stenosis. Proximal Outflow: Bilateral common femoral and visualized portions of the superficial and profunda femoral arteries are patent without evidence of aneurysm, dissection, vasculitis or significant stenosis. Veins: No focal venous abnormality. Review of the MIP images confirms the above findings. NON-VASCULAR Lower chest: Trace atherosclerotic calcifications along the coronary arteries. Dependent atelectasis in both lower lobes. Additionally, there are is mild focal ground-glass attenuation airspace opacity in the dependent aspect of the left lower lobe. No pneumothorax. No suspicious nodule. Hepatobiliary: No focal liver abnormality is seen. No gallstones, gallbladder wall thickening, or biliary dilatation. Pancreas: Abnormal appearance of the pancreatic tail which demonstrates inflammatory stranding which is now contiguous with the splenic parenchyma. Additionally, there is focal wedge-shaped hypoattenuation within the spleen extending from the pancreatic tail. Trace fluid identified in the peripancreatic soft tissues and extending into the left pericolic gutter. Spleen: Wedge-shaped hypoattenuation extending from the splenic  hilum where it abuts the pancreatic tail to the spleen surface consistent with a small region of infarct. Adrenals/Urinary Tract: Normal adrenal glands. Punctate nonobstructing stone in the interpolar right kidney measures up to 4 mm. Punctate nonobstructing stone in the lower pole of the left kidney measures 3 mm. Ureters are unremarkable. Similar appearance of prostatomegaly with nodular extension into the bladder trigone. Stomach/Bowel: Colonic diverticular disease without CT evidence of active inflammation. No evidence of obstruction or focal bowel wall thickening. Normal appendix in the right lower quadrant. The terminal ileum is unremarkable. Lymphatic: Right para-aortic lymphadenopathy. A cluster of nodes measuring 1.3, 1.71.5 cm in short axis are visible in the right para aortic space (image 120 series 5). Left internal iliac station lymph node is enlarged at approximately 1.4 cm in short axis (image 176 series 5). Reproductive: Similar appearance of prostatomegaly with nodular extension into the right bladder trigone. Other: Repaired fat containing umbilical hernia. Trace ascites in the region of the pancreatic tail extending into the left pericolic gutter. Trace free fluid visible in the pelvis is well. Musculoskeletal: No acute or significant osseous findings. Advanced lumbar degenerative disc disease and facet arthropathy. IMPRESSION: VASCULAR 1. No evidence of active bleeding. 2. No evidence of aneurysm, dissection or other acute vascular abnormality. NON-VASCULAR 1. Abnormal appearance of the pancreatic tail which is now inseparable from the adjacent and abutting splenic parenchyma. There is inflammatory stranding in the region as well as slight interval enlargement of a dystrophic calcification compared to prior imaging from 2017. Further, there is new wedge-shaped infarct in the same region. Differential considerations include focal pancreatitis of the pancreatic tail with involvement of the spleen  including a small splenic infarct versus pancreatic tail neoplasm with direct splenic invasion. Small volume reactive fluid in the peripancreatic soft tissues extending along the left pericolic gutter and low within the anatomic pelvis. 2. Abnormal retroperitoneal lymphadenopathy predominantly in the right para aortic space. Differential considerations include lymphoma versus metastatic prostate or bladder cancer. 3. Similar appearance of nodular extension of the prostate gland into the right bladder trigone compared to prior imaging from 02/21/2016. If anything, findings are slightly less conspicuous than previously seen. 4. Colonic diverticular disease without CT evidence of active inflammation. 5. Bilateral nonobstructing nephrolithiasis. 6. Additional ancillary findings as above. Electronically Signed   By: Jacqulynn Cadet M.D.   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 which would be probable etiology of isolated gastric varix - splenectomy would be treatment of choice, but may not feasible in patient with dementia and cirrhosis - endoscopic treatment with glue may be option (since there were stigmata of recent bleeding) which is not available at this facility  # severe symptomatic anemia with severe iron deficiency - p/w hgb 3.7- up to 7.5 after 4th unit - ferritin 4 and sat 2% - Hemoglobin one month ago was 7.4 and was 10 eighteen months ago  # GI Bleeding- 2/2 gastric varix - egd on 11/21/21- redf wale signs noted on gastric varix; no other signs of ugib - pt with melena/hematemesis and increased bun as well as clotted emesis - he did have some BRBPR but had hemorrhoids and radiation for prostate CA  # Abnormal CT imaging -concerning Abnormal retroperitoneal lymphadenopathy and  pancreatic tail abnormality - pt does have h/o prostate CA  # 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 EtOH abuse (sober for at least 5 years per POA) - MELD-Na 10; CP- A  #HCV (treatment naive)  # Hypokalemia- improving # Progressive Alzheimer's and vascular dementia  Plan:  Awaiting transfer to facility with endoscopic glue and IR capabilities 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  Transfusion and resuscitation as per primary team Avoid frequent lab draws to prevent lab induced anemia Supportive care and antiemetics as per primary team Maintain two sites IV access Avoid nsaids Monitor for GIB.  No ascites noted on [place] Recommend iv iron infusions while in the hospital HCV testing pending  Given advanced dementia, defer oncologic w/u to primary team and family desires  No plan for colonoscopy  Palliative care following- pt's poa wants all interventions at this time. Appreciate Palliative Care team assistance  I personally performed the service.  Management of other medical comorbidities as per primary team  Thank you for allowing [place] to participate in this patient's care. Please don't hesitate to call if any questions or concerns arise.   Annamaria Helling, DO [facility]  Portions of the record may have been created with voice recognition software. Occasional wrong-word or 'sound-a-like' substitutions may have occurred due to the inherent limitations of voice recognition software.  Read the chart carefully and recognize, using context, where substitutions may have occurred.

## 2021-11-23 NOTE — Progress Notes (Signed)
Pt arrived on the unit, bed alarm on, skin assessed with April, RN. V/S obtained, CHG given and safety ensured.

## 2021-11-23 NOTE — Progress Notes (Signed)
Attempted to call report to [facility] unsuccessfully.

## 2021-11-23 NOTE — Assessment & Plan Note (Signed)
Hold amlodipine for now since patient is normotensive.

## 2021-11-23 NOTE — Progress Notes (Signed)
                                                     Palliative Care Progress Note   Patient Name: Daniel Knapp       Date: 11/23/2021 DOB: 1951-03-15  Age: 71 y.o. MRN#: [id number] Attending Physician: Ezekiel Slocumb, DO Primary Care Physician: Derinda Late, MD Admit Date: 11/20/2021  After reviewing the patient's chart and assessing the patient at bedside, spoke with patient's dayshift nurse Beth.  No acute issues or concerns overnight.  Patient remains stable and is awaiting transfer to [facility] for further GI work-up and procedures.  No family at bedside.  PMT will continue to follow the patient throughout his hospitalization.  Family has PMT contact info and was encouraged to call with any palliative questions or concerns.  Thank you for allowing the Palliative Medicine Team to assist in the care of Daniel Knapp.  Casas Adobes Ilsa Iha, FNP-BC Palliative Medicine Team Team Phone # [phone]  NO CHARGE

## 2021-11-23 NOTE — Plan of Care (Signed)
  Problem: Clinical Measurements: Goal: Diagnostic test results will improve Outcome: Progressing   Problem: Elimination: Goal: Will not experience complications related to urinary retention Outcome: Progressing   Problem: Pain Managment: Goal: General experience of comfort will improve Outcome: Progressing   Problem: Safety: Goal: Ability to remain free from injury will improve Outcome: Progressing

## 2021-11-23 NOTE — Assessment & Plan Note (Signed)
Treatment as outlined in 1 

## 2021-11-23 NOTE — Progress Notes (Addendum)
Progress Note   Patient: Daniel Knapp VHQ:[id number] DOB: [birth year] DOA: 11/20/2021     3 DOS: the patient was seen and examined on 11/23/2021   Brief hospital course: 71 y.o. male with hypertension, rectal bleeding Alzheimer's dementia, history of hepatitis C, compensated liver cirrhosis admitted for severe symptomatic anemia concerning for GI bleed with a hemoglobin of 3.7 status post 4 PRBC transfusion  6/13: EGD showed fairly large gastric varix that looks like it has recently bled.  Will need BRTO procedure under IR.  Waiting for transfer at [facility].  6/14: Case discussed with GI.  They have been in talks with specialists including multiple radiologist at [facility] regarding best plan of care for patient, plan is yet to be determined but all options would still require transfer.  Awaiting bed.   Assessment and Plan: * Symptomatic anemia Patient has a history of hepatitis C with liver cirrhosis and was brought in by EMS for evaluation of weakness and lethargy. Patient was found to have a hemoglobin of 3.7g/dl compared to baseline 7.4g/dl a month ago.   Status post 4 PRBC transfusion. Per his sister she found evidence of hematemesis in his apartment. S/p EGD by Dr. 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 He will need transfer for BRTO at [facility] Dr. Virgina Jock has updated patient's sister.   --Continue protonix and octreotide drips as well as the abx.  Patient on transfer waitlist since 6/13 for [facility] for BRTO procedure IR Dr. Earleen Newport and GI Dr. Carlean Purl has graciously excepted for intervention.  GI has been in discussion with specialist at [facility] , patient may need splenectomy.    To be admitted under hospitalist/TRH service after transfer.    HTN (hypertension) Hold amlodipine for now since patient is normotensive.  Dementia ([place]) Continue Aricept and Namenda Patient will need increased nursing care due to his underlying history of  dementia.  Iron deficiency anemia Acute anemia due to acute blood loss.  Iron studies however show severe iron deficiency. -- IV iron infusion today, likely further infusions this admission -- Follow CBC -- Transfuse if hemoglobin less than 7 or actively bleeding  Fall Status post fall from weakness related to anemia Maintain fall precautions CK 369  Hypokalemia Repleted and resolved  Hepatitis Treatment as outlined in 1        Subjective: Patient was awake sitting up in bed when seen on rounds today.  He was more calm and did not appear restless.  He actually did speak to me today, yesterday he was nonverbal.  He denies having any pain or feeling sick at all.  No acute events reported.  Mittens remain on both hands indicating some restlessness or agitation related to his dementia in the hospital environment  Physical Exam: Vitals:   11/23/21 0019 11/23/21 0400 11/23/21 0729 11/23/21 1135  BP: 113/73 109/77 104/63 114/66  Pulse: 67 (!) 53 (!) 54 (!) 55  Resp: '17 16 17 16  '$ Temp: 98.6 F (37 C) 97.8 F (36.6 C) 98.9 F (37.2 C) 98.7 F (37.1 C)  TempSrc: Oral Oral Axillary Axillary  SpO2: 100% 96% 99% 99%  Weight:      Height:       Awake sitting up in bed, calm, no acute distress, confused Respiratory: Lungs clear bilaterally with no wheezes or rhonchi Cardiovascular regular rate and rhythm, no murmurs heard, no peripheral edema Abdomen soft, nontender nondistended Neuro: Awake, oriented to self, grossly nonfocal exam. Skin no rash  or lesion seen on visualized skin Psychiatry normal mood and affect, abnormal judgment and insight due to dementia  Data Reviewed:  Notable labs: Hemoglobin 7.3, glucose 107, calcium 8.5  Family Communication: None at bedside on rounds.    Disposition: Status is: Inpatient Remains inpatient appropriate because: Treatment of GI bleed and potential transfer waiting for [facility] for definitive management   Planned Discharge  Destination:  pending transfer to [facility] when bed available       Time spent: 35 minutes  Author: Ezekiel Slocumb, DO 11/23/2021 2:36 PM  For on call review [email or web address].

## 2021-11-23 NOTE — Progress Notes (Signed)
Notified by [facility] that patient has a bed at [facility] [phone]) and will be transferred this evening.  Patient's sister, [name], called and notified.

## 2021-11-23 NOTE — Assessment & Plan Note (Signed)
Repleted and resolved 

## 2021-11-23 NOTE — Assessment & Plan Note (Signed)
Acute anemia due to acute blood loss.  Iron studies however show severe iron deficiency. -- IV iron infusion today, likely further infusions this admission -- Follow CBC -- Transfuse if hemoglobin less than 7 or actively bleeding

## 2021-11-23 NOTE — Assessment & Plan Note (Signed)
Patient has a history of hepatitis C with liver cirrhosis and was brought in by EMS for evaluation of weakness and lethargy. Patient was found to have a hemoglobin of 3.7g/dl compared to baseline 7.4g/dl a month ago.   Status post 4 PRBC transfusion. Per his sister she found evidence of hematemesis in his apartment. S/p EGD by Dr. 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 He will need transfer for BRTO at [facility] Dr. Virgina Jock has updated patient's sister.   --Continue protonix and octreotide drips as well as the abx.  Patient on transfer waitlist since 6/13 for [facility] for BRTO procedure IR Dr. Earleen Newport and GI Dr. Carlean Purl has graciously excepted for intervention.  GI has been in discussion with specialist at [facility], patient may need splenectomy.    To be admitted under hospitalist/TRH service after transfer.

## 2021-11-23 NOTE — Assessment & Plan Note (Signed)
Status post fall from weakness related to anemia Maintain fall precautions CK 369

## 2021-11-23 NOTE — Assessment & Plan Note (Signed)
Continue Aricept and Namenda Patient will need increased nursing care due to his underlying history of dementia.

## 2021-11-23 NOTE — H&P (Signed)
History and Physical    Daniel Knapp PTW:[id number] DOB: February 08, 1951 DOA: 11/23/2021  PCP: Derinda Late, MD   Patient coming from: [facility]  Chief Complaint: GI bleeding   HPI: Daniel Knapp is a pleasant 71 y.o. male with medical history significant for dementia, untreated hepatitis C, cirrhosis, and hypertension who presented to [facility] on 11/20/2021 with after he was found down at home with alteration in his mental status.  Patient had been less responsive for the preceding 3 days, had some bright red blood per rectum noted by his caretaker who attributed this to hemorrhoids, and had not seen any vomiting or hematemesis.  Patient's sister indicated that she had seen blood clots on the floor the day before presentation.  [facility] Course: Upon arrival to the ED, patient is found to have systolic blood pressure in the 90s, heart rate in the 90s, was afebrile, and saturating mid 90s on room air.  Hemoglobin was found to be 3.7, he was started on IV Protonix and octreotide infusions, given a dose of Rocephin, 3 units of RBC ordered, GI consulted, and was admitted to the hospitalist service.  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  He received total 4 units RBC and also iron infusion.  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  Dr. Earleen Newport of IR and Dr. Carlean Purl of GI at [facility] agreed with transfer for consideration of BRTO, and the patient arrived to [facility] the night of 11/23/2021 in stable condition.  Review of Systems:  All other systems reviewed and apart from HPI, are negative.  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-has seen Dr Delaine Lame   Prostate cancer [facility]) 02/2016   Prostate enlargement    Urinary catheter insertion/adjustment/removal    Urinary retention    UTI (lower urinary tract infection)    UTI symptoms    Weight loss     Past Surgical History:  Procedure Laterality Date   ESOPHAGOGASTRODUODENOSCOPY N/A 11/21/2021   Procedure: ESOPHAGOGASTRODUODENOSCOPY (EGD);  Surgeon: Annamaria Helling, DO;  Location: [facility] ENDOSCOPY;  Service: Gastroenterology;  Laterality: N/A;   TRANSURETHRAL RESECTION OF BLADDER TUMOR N/A 08/17/2015   Procedure: TRANSURETHRAL RESECTION OF BLADDER TUMOR (TURBT);  Surgeon: [name], MD;  Location: [facility] ORS;  Service: Urology;  Laterality: N/A;   TRANSURETHRAL RESECTION OF PROSTATE N/A 08/17/2015   Procedure: TRANSURETHRAL RESECTION OF THE PROSTATE (TURP);  Surgeon: [name], MD;  Location: [facility] ORS;  Service: Urology;  Laterality: N/A;   XI ROBOTIC ASSISTED VENTRAL HERNIA N/A 09/30/2020   Procedure: XI ROBOTIC ASSISTED VENTRAL HERNIA;  Surgeon: Herbert Pun, MD;  Location: [facility] ORS;  Service: General;  Laterality: N/A;    Social History:   reports that he has never smoked. He has never used smokeless tobacco. He reports that he does not currently use alcohol. He reports that he does not currently use drugs after having used the following drugs: Marijuana.  No Known Allergies  Family History  Problem Relation Age of Onset   Heart disease Mother    Cancer Father  Kidney disease Neg Hx    Prostate cancer Neg Hx      Prior to Admission medications   Medication Sig Start Date End Date Taking? Authorizing Provider  amLODipine (NORVASC) 10 MG tablet Take 10 mg by mouth daily in the afternoon.    [provider]  atenolol (TENORMIN) 25 MG tablet Take 25 mg by mouth daily. 10/06/21   [provider]  divalproex (DEPAKOTE ER) 250 MG 24 hr tablet Take 250 mg by mouth daily in the afternoon. 07/25/20   [provider]  donepezil (ARICEPT) 10 MG tablet Take 10 mg by mouth daily in the afternoon. 07/09/20   [provider]  finasteride (PROSCAR) 5 MG tablet Take 5 mg by mouth daily in the afternoon. Reported on 08/17/2015    [provider]  melatonin 1 MG TABS tablet Take 12 mg by mouth at bedtime.    [provider]  memantine (NAMENDA) 5 MG tablet Take 5 mg by mouth 2 (two) times daily. 08/14/20   [provider]  Mouthwashes (MOUTH RINSE) LIQD solution 15 mLs by Mouth Rinse route in the morning, at noon, in the evening, and at bedtime. 11/23/21   Ezekiel Slocumb, DO  Multiple Vitamin (MULTIVITAMIN WITH MINERALS) TABS tablet Take 1 tablet by mouth daily in the afternoon.    [provider]  octreotide 500 mcg in sodium chloride 0.9 % 250 mL Inject 50 mcg/hr into the vein continuous. 11/21/21   Max Sane, MD  pantoprazole (PROTONIX) 80 mg/100 mL (0.8 mg/mL) SOLN Inject 8 mg/hr into the vein continuous. 11/21/21   Max Sane, MD  tamsulosin (FLOMAX) 0.4 MG CAPS capsule Take 0.4 mg by mouth daily in the afternoon. Reported on 09/01/2015    [provider]  vitamin B-12 (CYANOCOBALAMIN) 1000 MCG tablet Take 1,000 mcg by mouth daily in the afternoon.    [provider]    Physical Exam: Vitals:   11/23/21 2051 11/23/21 2334  BP: (!) 158/85 106/67  Pulse: 73 (!) 56  Resp: 19 12  Temp:  98 F (36.7 C)  TempSrc:  Axillary  SpO2: 98% 100%  Weight: 88.9 kg   Height: '6\' 2"'$  (1.88 m)     Constitutional: NAD, calm  Eyes: PERTLA, lids and conjunctivae normal ENMT: Mucous membranes are moist. Posterior pharynx clear of any exudate or lesions.   Neck: supple, no masses  Respiratory:  no wheezing, no crackles. No accessory muscle use.  Cardiovascular: S1 & S2 heard, regular rate and rhythm. No extremity edema.   Abdomen: No distension, no tenderness, soft. Bowel sounds active.  Musculoskeletal: no clubbing / cyanosis. No joint deformity  upper and lower extremities.   Skin: no significant rashes, lesions, ulcers. Warm, dry, well-perfused. Neurologic: CN 2-12 grossly intact. Moving all extremities. Alert and oriented to person only.  Psychiatric: Pleasant. Cooperative.    Labs and Imaging on Admission: I have personally reviewed following labs and imaging studies  CBC: Recent Labs  Lab 11/20/21 0922 11/20/21 1700 11/22/21 0541 11/22/21 1002 11/22/21 2156 11/23/21 0144 11/23/21 0853  WBC 9.4  --  5.5  --   --   --   --   HGB 3.7*   < > 7.7* 7.5* 7.3* 7.5* 7.3*  HCT 13.4*   < > 24.3* 23.4* 24.0* 24.7* 23.6*  MCV 81.2  --  83.5  --   --   --   --   PLT 131*  --  126*  --   --   --   --    < > =  values in this interval not displayed.   Basic Metabolic Panel: Recent Labs  Lab 11/20/21 0922 11/21/21 0502 11/22/21 0541 11/23/21 0144  NA 145 144 141 144  K 3.2* 3.5 3.3* 3.5  CL 116* 110 108 111  CO2 '22 28 28 29  '$ GLUCOSE 113* 113* 110* 107*  BUN 25* '16 18 21  '$ CREATININE 0.94 0.93 1.14 1.15  CALCIUM 8.3* 8.7* 8.6* 8.5*  MG 1.9  --   --   --    GFR: Estimated Creatinine Clearance: 68.5 mL/min (by C-G formula based on SCr of 1.15 mg/dL). Liver Function Tests: Recent Labs  Lab 11/20/21 0922 11/21/21 0502  AST 22 28  ALT 13 15  ALKPHOS 43 54  BILITOT 0.5 1.3*  PROT 6.8 7.1  ALBUMIN 3.1* 3.3*   No results for input(s): "LIPASE", "AMYLASE" in the last 168 hours. Recent Labs  Lab 11/20/21 1056  AMMONIA <10   Coagulation Profile: Recent Labs  Lab 11/20/21 1056 11/21/21 0502  INR 1.4* 1.3*   Cardiac Enzymes: Recent Labs  Lab 11/20/21 1719  CKTOTAL 369   BNP (last 3 results) No results for input(s): "PROBNP" in the last 8760 hours. HbA1C: No results for input(s): "HGBA1C" in the last 72 hours. CBG: No results for input(s): "GLUCAP" in the last 168 hours. Lipid Profile: No results for input(s): "CHOL", "HDL", "LDLCALC", "TRIG", "CHOLHDL", "LDLDIRECT" in the last 72 hours. Thyroid Function  Tests: No results for input(s): "TSH", "T4TOTAL", "FREET4", "T3FREE", "THYROIDAB" in the last 72 hours. Anemia Panel: No results for input(s): "VITAMINB12", "FOLATE", "FERRITIN", "TIBC", "IRON", "RETICCTPCT" in the last 72 hours. Urine analysis:    Component Value Date/Time   COLORURINE STRAW (A) 11/20/2021 1027   APPEARANCEUR CLEAR (A) 11/20/2021 1027   APPEARANCEUR Cloudy (A) 09/01/2015 1015   LABSPEC 1.014 11/20/2021 1027   LABSPEC 1.014 09/17/2014 2237   PHURINE 6.0 11/20/2021 1027   GLUCOSEU NEGATIVE 11/20/2021 1027   GLUCOSEU Negative 09/17/2014 2237   HGBUR NEGATIVE 11/20/2021 1027   BILIRUBINUR NEGATIVE 11/20/2021 1027   BILIRUBINUR Negative 09/01/2015 1015   BILIRUBINUR Negative 09/17/2014 2237   KETONESUR NEGATIVE 11/20/2021 1027   PROTEINUR NEGATIVE 11/20/2021 1027   NITRITE NEGATIVE 11/20/2021 1027   LEUKOCYTESUR NEGATIVE 11/20/2021 1027   LEUKOCYTESUR Negative 09/17/2014 2237   Sepsis Labs: '@LABRCNTIP'$ (procalcitonin:4,lacticidven:4) )No results found for this or any previous visit (from the past 240 hour(s)).   Radiological Exams on Admission: No results found.   Assessment/Plan   1. 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 Earleen Newport) and GI (Dr. Carlean Purl) at [facility] who agreed to transfer for consideration of BRTO  - He received 4 units RBC and IV iron at [facility] has remained stable  - Type and screen, monitor H&H, continue IV PPI and octreotide infusion, continue [place], follow-up GI and IR recommendations   2. Cirrhosis  - Suspected secondary to untreated hepatitis C  - MELD score is 12  - Appears compensated   3. Hypertension  - Treat as-needed only for now   4. Dementia  - Delirium precautions   5. IDA  - Received iron infusion  at [facility]    DVT prophylaxis: SCDs  Code Status: Full  Level of Care: Level of care: Progressive Family Communication: none present  Disposition  Plan:  Patient is from: home  Anticipated d/c is to: TBD Anticipated d/c date is: 11/27/21  Patient currently: pending IR consultation, treatment of gastric varix Consults called: GI and IR consulted by physician at [facility]  Admission status:  Inpatient   Vianne Bulls, MD [facility]  11/24/2021, 12:10 AM

## 2021-11-23 NOTE — TOC Initial Note (Signed)
Transition of Care [facility]) - Initial/Assessment Note    Patient Details  Name: Daniel Knapp MRN: [id number] Date of Birth: 05-01-1951  Transition of Care [facility]) CM/SW Contact:    Laurena Slimmer, RN Phone Number: 11/23/2021, 9:54 AM  Clinical Narrative:                  Transition of Care [facility]) Screening Note   Patient Details  Name: Daniel Knapp Date of Birth: 16-Jul-1950   Transition of Care [facility]) CM/SW Contact:    Laurena Slimmer, RN Phone Number: 11/23/2021, 9:54 AM    Transition of Care Department [facility]) has reviewed patient and no TOC needs have been identified at this time. We will continue to monitor patient advancement through interdisciplinary progression rounds. If new patient transition needs arise, please place a TOC consult.          Patient Goals and CMS Choice        Expected Discharge Plan and Services           Expected Discharge Date: 11/21/21                                    Prior Living Arrangements/Services                       Activities of Daily Living   ADL Screening (condition at time of admission) Patient's cognitive ability adequate to safely complete daily activities?: No Is the patient deaf or have difficulty hearing?: No Does the patient have difficulty seeing, even when wearing glasses/contacts?: No Does the patient have difficulty concentrating, remembering, or making decisions?: Yes Patient able to express need for assistance with ADLs?: No Does the patient have difficulty dressing or bathing?: Yes Independently performs ADLs?: No Does the patient have difficulty walking or climbing stairs?: Yes Weakness of Legs: None Weakness of Arms/Hands: Both  Permission Sought/Granted                  Emotional Assessment              Admission diagnosis:  Other ascites [R18.8] UGIB (upper gastrointestinal bleed) [K92.2] Symptomatic anemia [D64.9] Anemia, unspecified type [D64.9] Patient  Active Problem List   Diagnosis Date Noted   Symptomatic anemia 11/20/2021   HTN (hypertension)    Dementia ([place])    Hepatitis    Hypokalemia    Fall    Prostate cancer ([place]) 01/23/2016   BPH (benign prostatic hyperplasia) 08/17/2015   Urinary retention 11/10/2014   BPH (benign prostatic hypertrophy) with urinary retention 11/10/2014   PCP:  Derinda Late, MD Pharmacy:   [place], [place] [address] Phone: [phone] Fax: [phone]  [id number] [place] [place] [id number]: [phone]: [phone]    Social Determinants of Health (SDOH) Interventions    Readmission Risk Interventions     No data to display

## 2021-11-23 NOTE — Care Management Important Message (Signed)
Important Message  Patient Details  Name: Daniel Knapp MRN: [id number] Date of Birth: 1950-09-01   Medicare Important Message Given:  Other (see comment)  Awaiting transfer to [place].  Medicare IM withheld at this time due to pending acute to acute transfer.   [name] 11/23/2021, 12:51 PM

## 2021-11-24 DIAGNOSIS — K922 Gastrointestinal hemorrhage, unspecified: Secondary | ICD-10-CM | POA: Diagnosis not present

## 2021-11-24 DIAGNOSIS — R933 Abnormal findings on diagnostic imaging of other parts of digestive tract: Secondary | ICD-10-CM

## 2021-11-24 DIAGNOSIS — I864 Gastric varices: Secondary | ICD-10-CM | POA: Diagnosis not present

## 2021-11-24 LAB — CBC
HCT: 25.4 % — ABNORMAL LOW (ref 39.0–52.0)
HCT: 25.5 % — ABNORMAL LOW (ref 39.0–52.0)
HCT: 28.4 % — ABNORMAL LOW (ref 39.0–52.0)
HCT: 28.9 % — ABNORMAL LOW (ref 39.0–52.0)
Hemoglobin: 7.7 g/dL — ABNORMAL LOW (ref 13.0–17.0)
Hemoglobin: 7.8 g/dL — ABNORMAL LOW (ref 13.0–17.0)
Hemoglobin: 8.6 g/dL — ABNORMAL LOW (ref 13.0–17.0)
Hemoglobin: 9 g/dL — ABNORMAL LOW (ref 13.0–17.0)
MCH: 26.6 pg (ref 26.0–34.0)
MCH: 26.7 pg (ref 26.0–34.0)
MCH: 26.7 pg (ref 26.0–34.0)
MCH: 27.1 pg (ref 26.0–34.0)
MCHC: 30.3 g/dL (ref 30.0–36.0)
MCHC: 30.3 g/dL (ref 30.0–36.0)
MCHC: 30.6 g/dL (ref 30.0–36.0)
MCHC: 31.1 g/dL (ref 30.0–36.0)
MCV: 87 fL (ref 80.0–100.0)
MCV: 87.3 fL (ref 80.0–100.0)
MCV: 87.9 fL (ref 80.0–100.0)
MCV: 88.2 fL (ref 80.0–100.0)
Platelets: 102 10*3/uL — ABNORMAL LOW (ref 150–400)
Platelets: 132 10*3/uL — ABNORMAL LOW (ref 150–400)
Platelets: 134 10*3/uL — ABNORMAL LOW (ref 150–400)
Platelets: 145 10*3/uL — ABNORMAL LOW (ref 150–400)
RBC: 2.88 MIL/uL — ABNORMAL LOW (ref 4.22–5.81)
RBC: 2.92 MIL/uL — ABNORMAL LOW (ref 4.22–5.81)
RBC: 3.23 MIL/uL — ABNORMAL LOW (ref 4.22–5.81)
RBC: 3.32 MIL/uL — ABNORMAL LOW (ref 4.22–5.81)
RDW: 18.2 % — ABNORMAL HIGH (ref 11.5–15.5)
RDW: 18.4 % — ABNORMAL HIGH (ref 11.5–15.5)
RDW: 18.6 % — ABNORMAL HIGH (ref 11.5–15.5)
RDW: 18.8 % — ABNORMAL HIGH (ref 11.5–15.5)
WBC: 4.7 10*3/uL (ref 4.0–10.5)
WBC: 5.3 10*3/uL (ref 4.0–10.5)
WBC: 6.2 10*3/uL (ref 4.0–10.5)
WBC: 6.6 10*3/uL (ref 4.0–10.5)
nRBC: 0 % (ref 0.0–0.2)
nRBC: 0 % (ref 0.0–0.2)
nRBC: 0 % (ref 0.0–0.2)
nRBC: 0.4 % — ABNORMAL HIGH (ref 0.0–0.2)

## 2021-11-24 LAB — LIPASE, BLOOD: Lipase: 160 U/L — ABNORMAL HIGH (ref 11–51)

## 2021-11-24 LAB — TYPE AND SCREEN
ABO/RH(D): O POS
Antibody Screen: NEGATIVE

## 2021-11-24 MED ORDER — POTASSIUM CHLORIDE 20 MEQ PO PACK: 40.0000 meq | PACK | Freq: Once | ORAL | Status: DC

## 2021-11-24 NOTE — TOC Initial Note (Signed)
Transition of Care [facility]) - Initial/Assessment Note    Patient Details  Name: Daniel Knapp MRN: [id number] Date of Birth: February 13, 1951  Transition of Care [facility]) CM/SW Contact:    Benard Halsted, LCSW Phone Number: 11/24/2021, 6:26 PM  Clinical Narrative:                 CSW received consult for possible SNF placement at time of discharge. CSW spoke with patient's sister, [name], at bedside. She reported that patient has a private caregiver that comes daily from 9am-5pm. She expressed understanding of PT recommendation and is agreeable to SNF placement at time of discharge. She reports preference for [place] with first choice of [place] if possible. CSW discussed insurance authorization process and will provide Medicare SNF ratings list. Patient has received COVID vaccines. CSW will send out referrals for review.    Skilled Nursing Rehab Facilities-   [email or web address]   Ratings out of 5 possible   Name Address  Phone # Water[employer] Inspection Overall  [facility] [address], [place] '4 5 2 3  '$ [facility]  [address], [place] [phone] '3 2 5 5  '$ [facility] [place], [place] '3 1 1 1  '$ [place], [place] '3 2 4 4  '$ [facility] [address], [place] '1 1 2 1  '$ [place]. [place] '2 1 4 3  '$ [facility] [address] [place] '5 2 3 4  '$ [facility] [address] [place] '5 2 2 3  '$ [address] ([place]) [place], [address] [phone] '5 1 2 2  '$ [facility] Nursing [phone], [address] [phone] '4 1 2 1  '$ [facility] [address] [facility] [phone] '4 1 2 1  '$ [facility] ([place]) [place]. [address], [place] [phone] '4 1 1 1  '$ [place] [address] '3 2 4 4          '$ [place] [address] [place] '4 2 3 3  '$ [facility] [place] [address], [place] '4 1 5 4  '$ [facility], [place] [address], [place] [phone] '2 1 1 1  '$ [facility] [address] [facility] [phone] '2 1 3 2          '$ [address] (no [facility]) [place] [facility], [place] [phone] '4 5 5 5  '$ [facility] (No Humana) [address], [place] '3 1 4 3  '$ [facility] (No UHC) [place], [place] [phone] '5 5 5 5  '$ [facility] [address] [facility] [phone] '3 2 4 4  '$ [facility] [address] [place] '1 1 2 1  '$ [facility] [address] '2 1 1 1  '$ [place], [place] '5 2 4 5  '$ [facility] [address] [place] '3 1 1 1  '$ [facility] [place], [place] '2 1 2 1          '$ [facility] [address] [place] [phone] '1 1 1 1  '$ [place] [address], [phone]  [phone] '2 4 2 2  '$ [facility] [place] [address], [phone] '5 2 3 4  '$ [facility] [place] [address], [place] '2 1 1 1  '$ [place] (No Humana) [address] [place], [place] [phone] '2 1 3 2  '$ [facility] [address], [place] [phone] '3 1 1 1          '$ [facility] [place], [place] '5 4 5 5  '$ [facility])  [address], [place] '2 2 3 3  '$ [facility]) [place] [address], [place] '3 2 4 4  '$ [place] [address], [place] '4 3 4 4  '$ [address] [place], [place] '3 3 1 1  '$ [place] [facility]) [address] [place] [phone] '2  2 4 4     '$ Expected Discharge Plan: [place] Barriers to Discharge: Continued Medical Work up, SNF Pending bed offer, Insurance Authorization   Patient Goals and CMS Choice Patient states their goals for this hospitalization and ongoing recovery are:: Rehab CMS Medicare.gov Compare Post Acute Care list provided to:: Patient Represenative (must comment) Choice offered to / list presented to : Sibling  Expected Discharge Plan and Services Expected Discharge Plan: [place] In-house Referral: Clinical Social Work   Post Acute Care Choice: [place] Living arrangements for the past 2 months: [place]                                      Prior Living Arrangements/Services Living arrangements for the past 2 months: Single Family Home Lives with:: Self Patient language and need for interpreter reviewed:: Yes Do you feel safe going back to the place where you live?: Yes      Need for Family Participation in Patient Care: Yes (Comment) Care giver support system in place?: Yes (comment) Current home services: DME, Homehealth aide Gilford Rile) Criminal Activity/Legal Involvement Pertinent to Current Situation/Hospitalization: No - Comment as needed  Activities of Daily Living Home Assistive Devices/Equipment: None ADL Screening (condition at time of admission) Patient's cognitive ability adequate to safely complete daily activities?: No Is the patient deaf or have difficulty hearing?:  (unknown) Does the patient have difficulty seeing, even when wearing glasses/contacts?:  (unknown) Does the patient have difficulty concentrating, remembering, or making decisions?: Yes Patient able to express need for assistance with ADLs?: No Does the patient have difficulty dressing or bathing?: Yes Independently performs ADLs?: No Does the patient have difficulty walking or climbing stairs?: Yes Weakness of Legs: Both Weakness of  Arms/Hands: Both  Permission Sought/Granted Permission sought to share information with : Facility Sport and exercise psychologist, Family Supports Permission granted to share information with : No  Share Information with NAME: [name]  Permission granted to share info w AGENCY: SNFs  Permission granted to share info w Relationship: Sister/POA  Permission granted to share info w Contact Information: [phone]  Emotional Assessment Appearance:: Appears stated age Attitude/Demeanor/Rapport: Unable to Assess Affect (typically observed): Unable to Assess Orientation: : Oriented to Self Alcohol / Substance Use: Not Applicable Psych Involvement: No (comment)  Admission diagnosis:  Upper GI bleed [K92.2] Patient Active Problem List   Diagnosis Date Noted   Abnormal CT scan, gastrointestinal tract    Iron deficiency anemia 11/23/2021   Gastric varix 11/23/2021   Cirrhosis ([place]) 11/23/2021   Upper GI bleed 11/23/2021   Symptomatic anemia 11/20/2021   HTN (hypertension)    Dementia (  [place])    Hepatitis    Hypokalemia    Fall    Prostate cancer ([place]) 01/23/2016   BPH (benign prostatic hyperplasia) 08/17/2015   Urinary retention 11/10/2014   BPH (benign prostatic hypertrophy) with urinary retention 11/10/2014   PCP:  Derinda Late, MD Pharmacy:   [place], [place] [address] [place] [address] Phone: [phone] Fax: [phone]  [id number] [place], [place] [id number]: [phone]: [phone]    Social Determinants of Health (SDOH) Interventions    Readmission Risk Interventions     No data to display

## 2021-11-24 NOTE — Anesthesia Postprocedure Evaluation (Signed)
Anesthesia Post Note  Patient: Daniel Knapp  Procedure(s) Performed: ESOPHAGOGASTRODUODENOSCOPY (EGD)  Patient location during evaluation: PACU Anesthesia Type: General Level of consciousness: sedated Pain management: satisfactory to patient Vital Signs Assessment: post-procedure vital signs reviewed and stable Respiratory status: respiratory function stable and nonlabored ventilation Cardiovascular status: stable Anesthetic complications: no   No notable events documented.   Last Vitals:  Vitals:   11/23/21 1931 11/23/21 1946  BP: 118/67 118/67  Pulse: (!) 55 (!) 55  Resp:  18  Temp: 37.1 C 37.1 C  SpO2: 98% 98%    Last Pain:  Vitals:   11/23/21 1946  TempSrc: Oral  PainSc:                  VAN STAVEREN,Presli Fanguy

## 2021-11-24 NOTE — Evaluation (Signed)
Physical Therapy Evaluation Patient Details Name: Daniel Knapp MRN: [id number] DOB: 1951/01/26 Today's Date: 11/24/2021  History of Present Illness  71 y.o. male who presented to [facility] on 11/20/2021 after he was found down at home with alteration in his mental status. Hgb 3.7 on admission with improvement to 7.8 after 4 units of blood. Admitted with dx of upper GI bleed. Underwent EGD 6/13. Transferred to [facility] 6/15 for consideration of BRTO. PMH: dementia, untreated hepatitis C, cirrhosis, and hypertension   Clinical Impression  Pt admitted with above diagnosis. PTA pt lived at home alone, independent mobility, with family providing ADL assist/supervision during the day. Pt currently with functional limitations due to the deficits listed below (see PT Problem List). On eval, pt required mod assist bed mobility, mod assist sit to stand, and mod assist SPT. Increased assist required primarily due to cognitive status. Requiring increased time to accept physical assist with mobility initiation. Easily distracted with difficulty staying on task. Poor initiation and poor motor planning. Per chart, dementia at baseline. No family present to confirm, if current cognitive status is the same or worse. Pt will benefit from skilled PT to increase their independence and safety with mobility to allow discharge to the venue listed below.          Recommendations for follow up therapy are one component of a multi-disciplinary discharge planning process, led by the attending physician.  Recommendations may be updated based on patient status, additional functional criteria and insurance authorization.  Follow Up Recommendations Skilled nursing-short term rehab (<3 hours/day)    Assistance Recommended at Discharge Frequent or constant Supervision/Assistance  Patient can return home with the following  A lot of help with bathing/dressing/bathroom;Assistance with cooking/housework;Direct supervision/assist  for medications management;Assist for transportation;A lot of help with walking and/or transfers;Direct supervision/assist for financial management;Help with stairs or ramp for entrance    Equipment Recommendations Other (comment) (TBD)  Recommendations for Other Services       Functional Status Assessment Patient has had a recent decline in their functional status and demonstrates the ability to make significant improvements in function in a reasonable and predictable amount of time.     Precautions / Restrictions Precautions Precautions: Fall      Mobility  Bed Mobility Overal bed mobility: Needs Assistance Bed Mobility: Supine to Sit     Supine to sit: Mod assist, HOB elevated     General bed mobility comments: increased assist due to poor initiation, continual multi modal cues    Transfers Overall transfer level: Needs assistance Equipment used: 1 person hand held assist Transfers: Sit to/from Stand, Bed to chair/wheelchair/BSC Sit to Stand: Mod assist     Squat pivot transfers: Mod assist     General transfer comment: assist to power up. Difficult with motor planning, unable to take steps. Returned to sitting EOB. Re-initiated stance and then squat pivot bed to recliner. Continual multi modal cues.    Ambulation/Gait               General Gait Details: unable  Stairs            Wheelchair Mobility    Modified Rankin (Stroke Patients Only)       Balance Overall balance assessment: Needs assistance Sitting-balance support: No upper extremity supported, Feet supported Sitting balance-Leahy Scale: Good     Standing balance support: Bilateral upper extremity supported, During functional activity Standing balance-Leahy Scale: Poor Standing balance comment: reliant on external support  Pertinent Vitals/Pain Pain Assessment Pain Assessment: Faces Faces Pain Scale: No hurt    Home Living Family/patient  expects to be discharged to:: Private residence Living Arrangements: Alone Available Help at Discharge: Family;Available PRN/intermittently (Lives alone but family has been staying with pt during the day. He is only alone in the evening and at night when he is sleeping.) Type of Home: House             Additional Comments: Pt unable to provide home set up information. No family present.    Prior Function Prior Level of Function : Needs assist  Cognitive Assist : Mobility (cognitive);ADLs (cognitive)           Mobility Comments: Per chart, pt ambulated independently without AD. ADLs Comments: Per chart, family providing assist for ADLs, primarily pt's sister, Daniel Knapp. Steady decline over last several months. Daniel Knapp took Daniel Knapp's car keys away approx 4 months ago.     Hand Dominance        Extremity/Trunk Assessment   Upper Extremity Assessment Upper Extremity Assessment: Defer to OT evaluation    Lower Extremity Assessment Lower Extremity Assessment: Generalized weakness    Cervical / Trunk Assessment Cervical / Trunk Assessment: Normal  Communication   Communication: Expressive difficulties;Receptive difficulties (unsure if [facility])  Cognition Arousal/Alertness: Awake/alert Behavior During Therapy: Flat affect Overall Cognitive Status: No family/caregiver present to determine baseline cognitive functioning                                 General Comments: Dementia at baseline. Very distracted by lines. Also distracted by itching? Pt continually scratching head, ears, shoulders, and face. Little to no eye contact. Minimal verbalizations. Slow to respond. Unable to answer any orientation questions. Continual visual scanning of environment.        General Comments General comments (skin integrity, edema, etc.): Pt had pulled off external catheter upon therapist's arrival. RN in room at end of session to change bed linens with pt in recliner. Mittens placed on  pt.    Exercises     Assessment/Plan    PT Assessment Patient needs continued PT services  PT Problem List Decreased strength;Decreased mobility;Decreased safety awareness;Decreased activity tolerance;Decreased knowledge of precautions;Decreased balance;Decreased knowledge of use of DME;Decreased cognition       PT Treatment Interventions DME instruction;Therapeutic activities;Gait training;Cognitive remediation;Therapeutic exercise;Patient/family education;Balance training;Functional mobility training    PT Goals (Current goals can be found in the Care Plan section)  Acute Rehab PT Goals Patient Stated Goal: unable to state PT Goal Formulation: Patient unable to participate in goal setting Time For Goal Achievement: 12/08/21 Potential to Achieve Goals: Fair    Frequency Min 2X/week     Co-evaluation               AM-PAC PT "6 Clicks" Mobility  Outcome Measure Help needed turning from your back to your side while in a flat bed without using bedrails?: A Little Help needed moving from lying on your back to sitting on the side of a flat bed without using bedrails?: A Lot Help needed moving to and from a bed to a chair (including a wheelchair)?: A Lot Help needed standing up from a chair using your arms (e.g., wheelchair or bedside chair)?: A Lot Help needed to walk in hospital room?: Total Help needed climbing 3-5 steps with a railing? : Total 6 Click Score: 11    End of Session Equipment Utilized During Treatment:  Gait belt Activity Tolerance: Other (comment) (cognition limiting participation) Patient left: in chair;with call bell/phone within reach;with chair alarm set Nurse Communication: Mobility status PT Visit Diagnosis: Muscle weakness (generalized) (M62.81);Other abnormalities of gait and mobility (R26.89)    Time: 8546-2703 PT Time Calculation (min) (ACUTE ONLY): 29 min   Charges:   PT Evaluation $PT Eval Moderate Complexity: 1 Mod PT  Treatments $Therapeutic Activity: 8-22 mins        Lorrin Goodell, PT  Office # [phone] Pager [phone]   Lorriane Shire 11/24/2021, 9:05 AM

## 2021-11-24 NOTE — NC FL2 (Signed)
[place] LEVEL OF CARE SCREENING TOOL     IDENTIFICATION  Patient Name: Daniel Knapp Birthdate: 10/09/1950 Sex: male Admission Date (Current Location): 11/23/2021  [facility] and [place] Number:  Engineering geologist and Address:  [facility] [place], [place] 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   Cirrhosis ([place]) 11/23/2021   Upper GI bleed 11/23/2021   Symptomatic anemia 11/20/2021   HTN (hypertension)    Dementia ([place])    Hepatitis    Hypokalemia    Fall    Prostate cancer ([place]) 01/23/2016   BPH (benign prostatic hyperplasia) 08/17/2015   Urinary retention 11/10/2014   BPH (benign prostatic hypertrophy) with urinary retention 11/10/2014    Orientation RESPIRATION BLADDER Height & Weight     Self  Normal Incontinent Weight: 195 lb 15.8 oz (88.9 kg) Height:  '6\' 2"'$  (188 cm)  BEHAVIORAL SYMPTOMS/MOOD NEUROLOGICAL BOWEL NUTRITION STATUS      Continent Diet (See dc summary)  AMBULATORY STATUS COMMUNICATION OF NEEDS Skin   Extensive Assist Verbally Normal                       Personal Care Assistance Level of Assistance  Bathing, Feeding, Dressing Bathing Assistance: Maximum assistance Feeding assistance: Limited assistance Dressing Assistance: Limited assistance     Functional Limitations Info             SPECIAL CARE FACTORS FREQUENCY  PT (By licensed PT), OT (By licensed OT)     PT Frequency: 5x/week OT Frequency:  5x/week            Contractures Contractures Info: Not present    Additional Factors Info  Code Status, Allergies Code Status Info: Full Allergies Info: NKA           Current Medications (11/24/2021):  This is the current hospital active medication list Current Facility-Administered Medications  Medication Dose Route Frequency Provider Last Rate Last Admin   cefTRIAXone (ROCEPHIN) 2 g in sodium chloride 0.9 % 100 mL IVPB  2 g Intravenous Q24H Opyd, Ilene Qua, MD 200 mL/hr at 11/24/21 1021 2 g at 11/24/21 1021   octreotide (SANDOSTATIN) 500 mcg in sodium chloride 0.9 % 250 mL (2 mcg/mL) infusion  50 mcg/hr Intravenous Continuous Opyd, Ilene Qua, MD 25 mL/hr at 11/24/21 1243 50 mcg/hr at 11/24/21 1243   pantoprazole (PROTONIX) injection 40 mg  40 mg Intravenous Q12H Opyd, Ilene Qua, MD   40 mg at 11/24/21 1013   potassium chloride (KLOR-CON) packet 40 mEq  40 mEq Oral Once Thurnell Lose, MD       sodium chloride flush (NS) 0.9 % injection 3 mL  3 mL Intravenous Q12H Opyd, Ilene Qua, MD   3 mL at 11/24/21 1013     Discharge Medications: Please see discharge summary for a list of discharge medications.  Relevant Imaging Results:  Relevant Lab Results:   Additional Information SSN: [id number].  Moderna COVID-19 Vaccine 04/20/2020 , 09/05/2019 , 08/08/2019  Benard Halsted, LCSW

## 2021-11-24 NOTE — Evaluation (Signed)
Occupational Therapy Evaluation Patient Details Name: Daniel Knapp MRN: [id number] DOB: April 20, 1951 Today's Date: 11/24/2021   History of Present Illness 71 y.o. male who presented to [facility] on 11/20/2021 after he was found down at home with alteration in his mental status. Hgb 3.7 on admission with improvement to 7.8 after 4 units of blood. Admitted with dx of upper GI bleed. Underwent EGD 6/13. Transferred to [facility] 6/15 for consideration of BRTO. PMH: dementia, untreated hepatitis C, cirrhosis, and hypertension   Clinical Impression   Pt with decreased ability to consistently follow one step commands or answer any of therapist's questions.  He continued to pick at his IV once mitts were removed.  He only successfully washed his face when cued to and complete partial sit to stand at mod assist.  No other commands were followed.  Called his sister to discuss PLOF and he needed assist with all ADLs and had hired caregiver from 9-5 5 days/wk and her and her other sister came in the evening to help.  Pt was alone overnight.  She feels that he will likely need further supervision at discharge at [facility].  Feel he will benefit from acute care OT to help with progression of basic ADL tasks at this time and increasing ability to follow one step commands.        Recommendations for follow up therapy are one component of a multi-disciplinary discharge planning process, led by the attending physician.  Recommendations may be updated based on patient status, additional functional criteria and insurance authorization.   Follow Up Recommendations  Skilled nursing-short term rehab (<3 hours/day)    Assistance Recommended at Discharge Frequent or constant Supervision/Assistance  Patient can return home with the following A little help with walking and/or transfers;A lot of help with bathing/dressing/bathroom;Assistance with feeding;Direct supervision/assist for medications management;Help with stairs or ramp  for entrance;Assistance with cooking/housework;Direct supervision/assist for financial management    Functional Status Assessment  Patient has had a recent decline in their functional status and demonstrates the ability to make significant improvements in function in a reasonable and predictable amount of time.  Equipment Recommendations  Other (comment) (TBD next venue of care)    Recommendations for Other Services       Precautions / Restrictions Precautions Precautions: Fall Precaution Comments: hx of dementia, decreased ability to follow commands Restrictions Weight Bearing Restrictions: No      Mobility Bed Mobility                    Transfers Overall transfer level: Needs assistance Equipment used: None Transfers: Sit to/from Stand Sit to Stand: Mod assist (for partial stand)                  Balance Overall balance assessment: Needs assistance Sitting-balance support: No upper extremity supported, Feet supported Sitting balance-Leahy Scale: Good Sitting balance - Comments: Able to sit unsupported EOC once foot rest was lowered.   Standing balance support: Bilateral upper extremity supported Standing balance-Leahy Scale: Poor Standing balance comment: Pt needed assist with partial sit to stand and then could not maintain.                           ADL either performed or assessed with clinical judgement   ADL Overall ADL's : Needs assistance/impaired     Grooming: Wash/dry face;Minimal assistance;Sitting  General ADL Comments: Limited eval secondary to limitations in cognition with likely baseline dementia.  Pt only able to follow two, 1 step commands to wash his face as well as a command to stand up.  When he stood up, he only came part of the way up and then sat back down.  therapist was not able to get him to stand up again.     Vision Baseline Vision/History: 0 No visual  deficits Patient Visual Report: No change from baseline (unable to determine secondary to limited verbal output) Vision Assessment?: No apparent visual deficits     Perception Perception Perception: Not tested   Praxis Praxis Praxis: Not tested    Pertinent Vitals/Pain Pain Assessment Pain Assessment: Faces Faces Pain Scale: No hurt     Hand Dominance  (unable to determine)   Extremity/Trunk Assessment Upper Extremity Assessment Upper Extremity Assessment: Generalized weakness   Lower Extremity Assessment Lower Extremity Assessment: Defer to PT evaluation   Cervical / Trunk Assessment Cervical / Trunk Assessment: Normal   Communication Communication Communication: Expressive difficulties;Receptive difficulties (unsure if [facility])   Cognition Arousal/Alertness: Awake/alert Behavior During Therapy: Flat affect Overall Cognitive Status: History of cognitive impairments - at baseline                                 General Comments: Dementia at baseline, needed assist with all selfcare tasks and mobility recently.  Unable to answer any questions asked by therapist except his first name.     General Comments  Pt had pulled off external catheter upon therapist's arrival. RN in room at end of session to change bed linens with pt in recliner. Mittens placed on pt.            Home Living Family/patient expects to be discharged to:: Private residence Living Arrangements: Alone Available Help at Discharge: Family;Available PRN/intermittently;Skilled Nursing Facility (Lives alone but family has been staying with pt during the day. He is only alone in the evening and at night when he is sleeping. Has caregiver 9-5 5 days/wk) Type of Home: House             Bathroom Shower/Tub: Tub/shower unit     Bathroom Accessibility: Yes       Additional Comments: Pt unable to provide home set up information. No family present.      Prior Functioning/Environment  Prior Level of Function : Needs assist  Cognitive Assist : Mobility (cognitive);ADLs (cognitive)           Mobility Comments: Per chart, pt ambulated independently without AD. ADLs Comments: Per chart, family providing assist for ADLs, primarily pt's sister, [name]. Steady decline over last several months. [name] took Mohammad's car keys away approx 4 months ago.        OT Problem List: Decreased strength;Decreased cognition;Decreased activity tolerance;Impaired balance (sitting and/or standing);Decreased safety awareness;Decreased knowledge of use of DME or AE      OT Treatment/Interventions: Self-care/ADL training;Balance training;Therapeutic activities;Cognitive remediation/compensation;DME and/or AE instruction;Therapeutic exercise;Patient/family education    OT Goals(Current goals can be found in the care plan section) Acute Rehab OT Goals Patient Stated Goal: Pt did not state OT Goal Formulation: Patient unable to participate in goal setting Time For Goal Achievement: 12/08/21 Potential to Achieve Goals: Fair  OT Frequency: Min 2X/week       AM-PAC OT "6 Clicks" Daily Activity     Outcome Measure Help from another person eating meals?: A Lot  Help from another person taking care of personal grooming?: A Lot Help from another person toileting, which includes using toliet, bedpan, or urinal?: A Lot Help from another person bathing (including washing, rinsing, drying)?: A Lot Help from another person to put on and taking off regular upper body clothing?: A Lot Help from another person to put on and taking off regular lower body clothing?: A Lot 6 Click Score: 12   End of Session Equipment Utilized During Treatment: Gait belt Nurse Communication: Mobility status  Activity Tolerance: Other (comment) (Limited by cognition) Patient left: in chair;with call bell/phone within reach;with chair alarm set  OT Visit Diagnosis: Other symptoms and signs involving cognitive function;Muscle  weakness (generalized) (M62.81);Repeated falls (R29.6);Unsteadiness on feet (R26.81)                Time: 4196-2229 OT Time Calculation (min): 28 min Charges:  OT General Charges $OT Visit: 1 Visit OT Evaluation $OT Eval Moderate Complexity: 1 Mod OT Treatments $Self Care/Home Management : 8-22 mins Dandria Griego OTR/L 11/24/2021, 9:39 AM

## 2021-11-24 NOTE — Consult Note (Signed)
Chief Complaint: Patient was seen in consultation today for gastric varix treatment  Referring Physician(s): Corrie Mckusick  Supervising Physician: Dr. Serafina Royals   Patient Status: [facility] - [facility]  History of Present Illness: Daniel Knapp is a 71 y.o. male with a medical history significant for prostate cancer, HTN, hepatitis C, cirrhosis and dementia. He presented to the [facility] ED 11/20/21  after being found down on the floor with altered mental status. His family stated he had been experiencing bright red blood per rectum but the patient has a history of hemorrhoids and they weren't overly concerned by this.   His hemoglobin level in the ED was 3.7 and a rectal exam revealed black melanotic stool. With his history of cirrhosis there was concern for variceal bleeding. He was admitted for further treatment/work up.   CTA Abdomen/pelvis 11/21/21 IMPRESSION: VASCULAR  1. No evidence of active bleeding. 2. No evidence of aneurysm, dissection or other acute vascular abnormality.  NON-VASCULAR 1. Abnormal appearance of the pancreatic tail which is now inseparable from the adjacent and abutting splenic parenchyma. There is inflammatory stranding in the region as well as slight interval enlargement of a dystrophic calcification compared to prior imaging from 2017. Further, there is new wedge-shaped infarct in the same region. Differential considerations include focal pancreatitis of the pancreatic tail with involvement of the spleen including a small splenic infarct versus pancreatic tail neoplasm with direct splenic invasion. Small volume reactive fluid in the peripancreatic soft tissues extending along the left pericolic gutter and low within the anatomic pelvis. 2. Abnormal retroperitoneal lymphadenopathy predominantly in the right para aortic space. Differential considerations include lymphoma versus metastatic prostate or bladder cancer. 3. Similar appearance of  nodular extension of the prostate gland into the right bladder trigone compared to prior imaging from 02/21/2016. If anything, findings are slightly less conspicuous than previously seen. 4. Colonic diverticular disease without CT evidence of active inflammation. 5. Bilateral nonobstructing nephrolithiasis. 6. Additional ancillary findings as above.  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 He was transferred to [facility] and Interventional Radiology has been asked to evaluate this patient for possible treatment options.   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-has seen Dr Delaine Lame   Prostate cancer [facility]) 02/2016   Prostate enlargement    Urinary catheter insertion/adjustment/removal    Urinary retention    UTI (lower urinary tract infection)    UTI symptoms    Weight loss     Past Surgical History:  Procedure Laterality Date   ESOPHAGOGASTRODUODENOSCOPY N/A 11/21/2021   Procedure: ESOPHAGOGASTRODUODENOSCOPY (EGD);  Surgeon: Annamaria Helling, DO;  Location: [facility] ENDOSCOPY;  Service: Gastroenterology;  Laterality: N/A;   TRANSURETHRAL RESECTION OF BLADDER TUMOR N/A 08/17/2015   Procedure: TRANSURETHRAL RESECTION OF BLADDER TUMOR (TURBT);  Surgeon: [name], MD;  Location: [facility] ORS;  Service: Urology;  Laterality: N/A;   TRANSURETHRAL RESECTION OF PROSTATE N/A 08/17/2015   Procedure: TRANSURETHRAL RESECTION OF THE PROSTATE (TURP);  Surgeon: [name], MD;  Location: [facility] ORS;  Service: Urology;  Laterality: N/A;   XI ROBOTIC ASSISTED VENTRAL HERNIA N/A 09/30/2020   Procedure: XI ROBOTIC ASSISTED VENTRAL HERNIA;  Surgeon: Herbert Pun, MD;  Location: [facility] ORS;  Service: General;  Laterality: N/A;    Allergies: Patient has  no known allergies.  Medications: Prior to Admission medications   Medication Sig Start Date End Date Taking? Authorizing Provider  amLODipine (NORVASC) 10 MG tablet Take 10 mg by mouth daily in the afternoon.    [provider]  atenolol (TENORMIN) 25 MG tablet Take 25 mg by mouth daily. 10/06/21   [provider]  divalproex (DEPAKOTE ER) 250 MG 24 hr tablet Take 250 mg by mouth daily in the afternoon. 07/25/20   [provider]  donepezil (ARICEPT) 10 MG tablet Take 10 mg by mouth daily in the afternoon. 07/09/20   [provider]  finasteride (PROSCAR) 5 MG tablet Take 5 mg by mouth daily in the afternoon. Reported on 08/17/2015    [provider]  melatonin 1 MG TABS tablet Take 12 mg by mouth at bedtime.    [provider]  memantine (NAMENDA) 5 MG tablet Take 5 mg by mouth 2 (two) times daily. 08/14/20   [provider]  Mouthwashes (MOUTH RINSE) LIQD solution 15 mLs by Mouth Rinse route in the morning, at noon, in the evening, and at bedtime. 11/23/21   Ezekiel Slocumb, DO  Multiple Vitamin (MULTIVITAMIN WITH MINERALS) TABS tablet Take 1 tablet by mouth daily in the afternoon.    [provider]  octreotide 500 mcg in sodium chloride 0.9 % 250 mL Inject 50 mcg/hr into the vein continuous. 11/21/21   Max Sane, MD  pantoprazole (PROTONIX) 80 mg/100 mL (0.8 mg/mL) SOLN Inject 8 mg/hr into the vein continuous. 11/21/21   Max Sane, MD  tamsulosin (FLOMAX) 0.4 MG CAPS capsule Take 0.4 mg by mouth daily in the afternoon. Reported on 09/01/2015    [provider]  vitamin B-12 (CYANOCOBALAMIN) 1000 MCG tablet Take 1,000 mcg by mouth daily in the afternoon.    [provider]     Family History  Problem Relation Age of Onset   Heart disease Mother    Cancer Father    Knapp disease Neg Hx    Prostate cancer Neg Hx     Social History   Socioeconomic History   Marital status: Single    Spouse name: Not  on file   Number of children: Not on file   Years of education: Not on file   Highest education level: Not on file  Occupational History   Not on file  Tobacco Use   Smoking status: Never   Smokeless tobacco: Never  Vaping Use   Vaping Use: Never used  Substance and Sexual Activity   Alcohol use: Not Currently    Alcohol/week: 0.0 standard drinks of alcohol   Drug use: Not Currently    Types: Marijuana    Comment: years ago   Sexual activity: Not on file  Other Topics Concern   Not on file  Social History Narrative   Not on file   Social Determinants of Health   Financial Resource Strain: Not on file  Food Insecurity: Not on file  Transportation Needs: Not on file  Physical Activity: Not on file  Stress: Not on file  Social Connections: Not on file    Review of Systems: A 12 point ROS discussed and pertinent positives are indicated in the HPI above.  All other systems are negative.  Review of Systems  Unable to perform ROS: Mental status change    Vital Signs: BP 113/70 (BP Location: Left Arm)   Pulse 60  Temp 98.3 F (36.8 C) (Axillary)   Resp 16   Ht '6\' 2"'$  (1.88 m)   Wt 195 lb 15.8 oz (88.9 kg)   SpO2 100%   BMI 25.16 kg/m   Physical Exam Constitutional:      General: He is not in acute distress.    Appearance: He is not ill-appearing.  HENT:     Mouth/Throat:     Mouth: Mucous membranes are moist.     Pharynx: Oropharynx is clear.  Cardiovascular:     Rate and Rhythm: Normal rate and regular rhythm.     Pulses: Normal pulses.     Heart sounds: Normal heart sounds.  Pulmonary:     Effort: Pulmonary effort is normal.     Breath sounds: Normal breath sounds.  Abdominal:     General: Bowel sounds are normal.     Palpations: Abdomen is soft.  Musculoskeletal:     Right lower leg: No edema.     Left lower leg: No edema.  Skin:    General: Skin is warm and dry.  Neurological:     Mental Status: He is alert.     Comments: Safety mitts in place.  Patient appears pleasantly confused. He is non-verbal and unable to follow any commands. He was sitting quietly in the chair in the room, rocking slightly back and forth and gently reaching his hands out to touch various objects within his reach.      Imaging: CT Angio Abd/Pel w/ and/or w/o  Result Date: 11/21/2021 CLINICAL DATA:  Severe symptomatic anemia, compensated cirrhosis EXAM: CTA ABDOMEN AND PELVIS WITHOUT AND WITH CONTRAST TECHNIQUE: Multidetector CT imaging of the abdomen and pelvis was performed using the standard protocol during bolus administration of intravenous contrast. Multiplanar reconstructed images and MIPs were obtained and reviewed to evaluate the vascular anatomy. RADIATION DOSE REDUCTION: This exam was performed according to the departmental dose-optimization program which includes automated exposure control, adjustment of the mA and/or kV according to patient size and/or use of iterative reconstruction technique. CONTRAST:  133m OMNIPAQUE IOHEXOL 350 MG/ML SOLN COMPARISON:  Prior CT abdomen/pelvis 02/21/2016 FINDINGS: VASCULAR Aorta: Normal caliber aorta without aneurysm, dissection, vasculitis or significant stenosis. Celiac: Patent without evidence of aneurysm, dissection, vasculitis or significant stenosis. Lateral segmental branch of the left hepatic artery is replaced to the left gastric artery. SMA: Patent without evidence of aneurysm, dissection, vasculitis or significant stenosis. Renals: Both main renal arteries are patent without evidence of aneurysm, dissection, vasculitis, fibromuscular dysplasia or significant stenosis. Accessory left lower pole renal artery. IMA: Patent without evidence of aneurysm, dissection, vasculitis or significant stenosis. Inflow: Patent without evidence of aneurysm, dissection, vasculitis or significant stenosis. Proximal Outflow: Bilateral common femoral and visualized portions of the superficial and profunda femoral arteries are patent  without evidence of aneurysm, dissection, vasculitis or significant stenosis. Veins: No focal venous abnormality. Review of the MIP images confirms the above findings. NON-VASCULAR Lower chest: Trace atherosclerotic calcifications along the coronary arteries. Dependent atelectasis in both lower lobes. Additionally, there are is mild focal ground-glass attenuation airspace opacity in the dependent aspect of the left lower lobe. No pneumothorax. No suspicious nodule. Hepatobiliary: No focal liver abnormality is seen. No gallstones, gallbladder wall thickening, or biliary dilatation. Pancreas: Abnormal appearance of the pancreatic tail which demonstrates inflammatory stranding which is now contiguous with the splenic parenchyma. Additionally, there is focal wedge-shaped hypoattenuation within the spleen extending from the pancreatic tail. Trace fluid identified in the peripancreatic soft tissues and extending into the left  pericolic gutter. Spleen: Wedge-shaped hypoattenuation extending from the splenic hilum where it abuts the pancreatic tail to the spleen surface consistent with a small region of infarct. Adrenals/Urinary Tract: Normal adrenal glands. Punctate nonobstructing stone in the interpolar right Knapp measures up to 4 mm. Punctate nonobstructing stone in the lower pole of the left Knapp measures 3 mm. Ureters are unremarkable. Similar appearance of prostatomegaly with nodular extension into the bladder trigone. Stomach/Bowel: Colonic diverticular disease without CT evidence of active inflammation. No evidence of obstruction or focal bowel wall thickening. Normal appendix in the right lower quadrant. The terminal ileum is unremarkable. Lymphatic: Right para-aortic lymphadenopathy. A cluster of nodes measuring 1.3, 1.71.5 cm in short axis are visible in the right para aortic space (image 120 series 5). Left internal iliac station lymph node is enlarged at approximately 1.4 cm in short axis (image 176 series  5). Reproductive: Similar appearance of prostatomegaly with nodular extension into the right bladder trigone. Other: Repaired fat containing umbilical hernia. Trace ascites in the region of the pancreatic tail extending into the left pericolic gutter. Trace free fluid visible in the pelvis is well. Musculoskeletal: No acute or significant osseous findings. Advanced lumbar degenerative disc disease and facet arthropathy. IMPRESSION: VASCULAR 1. No evidence of active bleeding. 2. No evidence of aneurysm, dissection or other acute vascular abnormality. NON-VASCULAR 1. Abnormal appearance of the pancreatic tail which is now inseparable from the adjacent and abutting splenic parenchyma. There is inflammatory stranding in the region as well as slight interval enlargement of a dystrophic calcification compared to prior imaging from 2017. Further, there is new wedge-shaped infarct in the same region. Differential considerations include focal pancreatitis of the pancreatic tail with involvement of the spleen including a small splenic infarct versus pancreatic tail neoplasm with direct splenic invasion. Small volume reactive fluid in the peripancreatic soft tissues extending along the left pericolic gutter and low within the anatomic pelvis. 2. Abnormal retroperitoneal lymphadenopathy predominantly in the right para aortic space. Differential considerations include lymphoma versus metastatic prostate or bladder cancer. 3. Similar appearance of nodular extension of the prostate gland into the right bladder trigone compared to prior imaging from 02/21/2016. If anything, findings are slightly less conspicuous than previously seen. 4. Colonic diverticular disease without CT evidence of active inflammation. 5. Bilateral nonobstructing nephrolithiasis. 6. Additional ancillary findings as above. Electronically Signed   By: Jacqulynn Cadet M.D.   On: 11/21/2021 16:52   US Abdomen Limited  Result Date: 11/20/2021 CLINICAL DATA:   Ascites, cirrhosis. EXAM: LIMITED ABDOMEN ULTRASOUND FOR ASCITES TECHNIQUE: Limited ultrasound survey for ascites was performed in all four abdominal quadrants. COMPARISON:  None Available. FINDINGS: Four-quadrant ultrasound demonstrates no significant abdominal ascites. IMPRESSION: No significant abdominal ascites. Electronically Signed   By: San Morelle M.D.   On: 11/20/2021 14:34   CT Cervical Spine Wo Contrast  Result Date: 11/20/2021 CLINICAL DATA:  Trauma EXAM: CT CERVICAL SPINE WITHOUT CONTRAST TECHNIQUE: Multidetector CT imaging of the cervical spine was performed without intravenous contrast. Multiplanar CT image reconstructions were also generated. RADIATION DOSE REDUCTION: This exam was performed according to the departmental dose-optimization program which includes automated exposure control, adjustment of the mA and/or kV according to patient size and/or use of iterative reconstruction technique. COMPARISON:  None Available. FINDINGS: Alignment: Degenerative reversal of the normal cervical lordosis. Skull base and vertebrae: No acute fracture. No primary bone lesion or focal pathologic process. Soft tissues and spinal canal: No prevertebral fluid or swelling. No visible canal hematoma. Disc levels: Moderate to  severe disc space height loss and anterior osteophytosis of C5 through T1, with otherwise mild disc space height loss and osteophytosis of the upper cervical levels. Upper chest: Negative. Other: None. IMPRESSION: 1. No acute fracture or static subluxation of the cervical spine. 2. Moderate to severe disc space height loss and anterior osteophytosis of C5 through T1, with associated degenerative reversal of the normal cervical lordosis. Electronically Signed   By: Delanna Ahmadi M.D.   On: 11/20/2021 10:35   CT HEAD WO CONTRAST (5MM)  Result Date: 11/20/2021 CLINICAL DATA:  Altered mental status, nontraumatic (Ped 0-17y) EXAM: CT HEAD WITHOUT CONTRAST TECHNIQUE: Contiguous axial  images were obtained from the base of the skull through the vertex without intravenous contrast. RADIATION DOSE REDUCTION: This exam was performed according to the departmental dose-optimization program which includes automated exposure control, adjustment of the mA and/or kV according to patient size and/or use of iterative reconstruction technique. COMPARISON:  MRI head April 16, 2018. FINDINGS: Brain: No evidence of acute infarction, hemorrhage, hydrocephalus, extra-axial collection or mass lesion/mass effect. Cerebral atrophy. Vascular: No hyperdense vessel identified. Calcific intracranial atherosclerosis. Skull: No acute fracture. Sinuses/Orbits: Largely clear sinuses.  No acute orbital findings. Other: No mastoid effusions. IMPRESSION: 1. No evidence of acute intracranial abnormality. 2.  Cerebral atrophy (ICD10-G31.9). Electronically Signed   By: Margaretha Sheffield M.D.   On: 11/20/2021 10:31    Labs:  CBC: Recent Labs    11/20/21 0922 11/20/21 1700 11/22/21 0541 11/22/21 1002 11/23/21 0144 11/23/21 0853 11/23/21 2340 11/24/21 0731  WBC 9.4  --  5.5  --   --   --  5.3 4.7  HGB 3.7*   < > 7.7*   < > 7.5* 7.3* 7.8* 7.7*  HCT 13.4*   < > 24.3*   < > 24.7* 23.6* 25.5* 25.4*  PLT 131*  --  126*  --   --   --  132* 102*   < > = values in this interval not displayed.    COAGS: Recent Labs    11/20/21 1056 11/21/21 0502  INR 1.4* 1.3*  APTT 32  --     BMP: Recent Labs    11/20/21 0922 11/21/21 0502 11/22/21 0541 11/23/21 0144  NA 145 144 141 144  K 3.2* 3.5 3.3* 3.5  CL 116* 110 108 111  CO2 '22 28 28 29  '$ GLUCOSE 113* 113* 110* 107*  BUN 25* '16 18 21  '$ CALCIUM 8.3* 8.7* 8.6* 8.5*  CREATININE 0.94 0.93 1.14 1.15  GFRNONAA >60 >60 >60 >60    LIVER FUNCTION TESTS: Recent Labs    11/20/21 0922 11/21/21 0502  BILITOT 0.5 1.3*  AST 22 28  ALT 13 15  ALKPHOS 43 54  PROT 6.8 7.1  ALBUMIN 3.1* 3.3*    TUMOR MARKERS: No results for input(s): "AFPTM", "CEA",  "CA199", "CHROMGRNA" in the last 8760 hours.  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 Possible treatment options include surgical intervention (splenectomy/distal pancreatectomy), endoscopic [place] injection of glue by GI or transhepatic venous obliteration by IR. IR worked closely with the consulting teams to formulate an appropriate treatment plan for this patient. After much discussion the teams arrived at a consensus for IR to attempt an image-guided transhepatic portal venogram with possible venous obliteration of the gastric varix.   The patient is tentatively scheduled  for this procedure 11/25/21. The procedure was discussed with the patient's [name], who was at the bedside. She is also the patient's healthcare power of attorney and the consent was signed by  her. Daniel Knapp understands the procedure will be attempted under moderate sedation but may require general anesthesia (which would be arranged at a later date) if the patient is unable to tolerate procedure/cooperate under moderate sedation.   Risks and benefits of this procedure were discussed with the patient and/or the patient's family including, but not limited to, infection, bleeding, damage to adjacent structures, worsening hepatic and/or cardiac function, worsening and/or the development of altered mental status, non-target embolization and death.   This interventional procedure involves the use of X-rays and because of the nature of the planned procedure, it is possible that we will have prolonged use of X-ray fluoroscopy.  Potential radiation risks to you include (but are not limited to) the following: - A slightly elevated risk for cancer  several years later in life. This risk is typically less than 0.5% percent. This risk is low in comparison to the normal incidence of  human cancer, which is 33% for women and 50% for men according to the [place]. - Radiation induced injury can include skin redness, resembling a rash, tissue breakdown / ulcers and hair loss (which can be temporary or permanent).   The likelihood of either of these occurring depends on the difficulty of the procedure and whether you are sensitive to radiation due to previous procedures, disease, or genetic conditions.   IF your procedure requires a prolonged use of radiation, you will be notified and given written instructions for further action.  It is your responsibility to monitor the irradiated area for the 2 weeks following the procedure and to notify your physician if you are concerned that you have suffered a radiation induced injury.    All of the patient's questions were answered, patient is agreeable to proceed. The patient will be NPO at midnight. CBC, BMP and PT/INR ordered for the morning  Consent signed and in IR.   Thank you for this interesting consult.  I greatly enjoyed meeting Daniel Knapp and look forward to participating in their care.  A copy of this report was sent to the requesting provider on this date.  Electronically Signed: Soyla Dryer, AGACNP-BC [phone] 11/24/2021, 3:30 PM   I spent a total of 40 Minutes    in face to face in clinical consultation, greater than 50% of which was counseling/coordinating care for gastric varix treatment

## 2021-11-24 NOTE — Consult Note (Addendum)
Referring Provider: Corrie Mckusick, DO Primary Care Physician:  Derinda Late, MD Primary Gastroenterologist:  Dr. Gerarda Gunther   Reason for Consultation: GI bleed  HPI: Daniel Knapp is a 71 y.o. 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   On Friday 11/17/2021, his sister found him sitting up on his sofa with red bloody emesis on the floor. He was not in any acute distress at that time. Monday 11/20/2021, his sister found him down on the kitchen floor incontinent of urine, darker red emesis was on the kitchen rug. He was unable to stand up. EMS was called and he was transported to [facility].  Labs in the ED showed a hemoglobin level of 3.7.  He was transfused 4 units of PRBCs.  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  He was transferred to [facility] for possible balloon occluded retrograde transvenous obliteration (BRTO procedure) to reduce the risk of further gastric variceal bleeding.   His sister [name] and [place] is at the bedside. She provides the patient's history as he has severe dementia with limited verbal communications. No prior history of GI bleed. Never had an EGD or colonoscopy. No history of GERD. His sisters have noticed intermittent bright and red dark blood with his bowel movements which occurs intermittently over the past year few years which was attributed to having hemorrhoids. A colonoscopy was ordered by a GI in [place] 04/2020 but this procedure was not scheduled as his sister did not assess he would tolerate the bowel prep.  Never had a screening colonoscopy. History of heavy alcohol use and crack cocaine use in the past, abstinent x 13+  years. Incarcerated intermittently in the mid 2000's. 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 Reported history of hepatitis C, never had treatment. No NSAID use. Not on any blood thinners. No known family history of esophageal, gastric or colon cancer.   EGD 11/21/2021 by Dr. Janalyn Harder: - Normal duodenal bulb, first portion of the duodenum and second portion of the duodenum. - Type 1 isolated gastric varices (IGV1, varices located in the fundus), without bleeding. - Z-line regular. - Esophagogastric landmarks identified. - No specimens collected.  - Return patient to hospital ward for ongoing care. - Transfer patient to another hospital. - hospital with BRTO capabilities - NPO. - Continue present medications. - Continue protonix and octreotide gtt as well as antibiotics - Refer to an interventional radiologist. - The findings and recommendations were discussed with the referring physician. - The findings and recommendations were  Abdominal/pelvic CTA 11/21/2021  IMPRESSION: VASCULAR   1. No evidence of active bleeding. 2. No evidence of aneurysm, dissection or other acute vascular abnormality.   NON-VASCULAR   1. Abnormal appearance of the pancreatic tail which is now inseparable from the adjacent and abutting splenic parenchyma. There is inflammatory stranding in the region as well as slight interval enlargement of a dystrophic calcification compared to prior imaging from 2017. Further, there is new wedge-shaped infarct in the same region. Differential considerations include focal pancreatitis of the pancreatic tail with involvement of the spleen including a small splenic infarct versus pancreatic tail neoplasm with direct splenic invasion. Small volume  reactive fluid in the peripancreatic soft tissues extending along the left pericolic gutter and low within the anatomic pelvis. 2. Abnormal  retroperitoneal lymphadenopathy predominantly in the right para aortic space. Differential considerations include lymphoma versus metastatic prostate or bladder cancer. 3. Similar appearance of nodular extension of the prostate gland into the right bladder trigone compared to prior imaging from 02/21/2016. If anything, findings are slightly less conspicuous than previously seen. 4. Colonic diverticular disease without CT evidence of active inflammation. 5. Bilateral nonobstructing nephrolithiasis. 6. Additional ancillary findings as above.   RUQ sonogram 11/20/2021: Limited ultrasound survey for ascites was performed in all four abdominal quadrants.   COMPARISON:  None Available.   FINDINGS: Four-quadrant ultrasound demonstrates no significant abdominal ascites.   IMPRESSION: No significant abdominal ascites.   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-has seen Dr Delaine Lame   Prostate cancer [facility]) 02/2016   Prostate enlargement    Urinary catheter insertion/adjustment/removal    Urinary retention    UTI (lower urinary tract infection)    UTI symptoms    Weight loss     Past Surgical History:  Procedure Laterality Date   ESOPHAGOGASTRODUODENOSCOPY N/A 11/21/2021   Procedure: ESOPHAGOGASTRODUODENOSCOPY (EGD);  Surgeon: Annamaria Helling, DO;  Location: [facility] ENDOSCOPY;  Service: Gastroenterology;  Laterality: N/A;   TRANSURETHRAL RESECTION OF BLADDER TUMOR N/A 08/17/2015   Procedure: TRANSURETHRAL RESECTION OF BLADDER TUMOR (TURBT);  Surgeon: [name], MD;  Location: [facility] ORS;  Service: Urology;  Laterality: N/A;   TRANSURETHRAL RESECTION OF PROSTATE N/A 08/17/2015   Procedure: TRANSURETHRAL RESECTION OF THE PROSTATE (TURP);  Surgeon: [name], MD;  Location: [facility] ORS;   Service: Urology;  Laterality: N/A;   XI ROBOTIC ASSISTED VENTRAL HERNIA N/A 09/30/2020   Procedure: XI ROBOTIC ASSISTED VENTRAL HERNIA;  Surgeon: Herbert Pun, MD;  Location: [facility] ORS;  Service: General;  Laterality: N/A;    Prior to Admission medications   Medication Sig Start Date End Date Taking? Authorizing Provider  amLODipine (NORVASC) 10 MG tablet Take 10 mg by mouth daily in the afternoon.    [provider]  atenolol (TENORMIN) 25 MG tablet Take 25 mg by mouth daily. 10/06/21   [provider]  divalproex (DEPAKOTE ER) 250 MG 24 hr tablet Take 250 mg by mouth daily in the afternoon. 07/25/20   [provider]  donepezil (ARICEPT) 10 MG tablet Take 10 mg by mouth daily in the afternoon. 07/09/20   [provider]  finasteride (PROSCAR) 5 MG tablet Take 5 mg by mouth daily in the afternoon. Reported on 08/17/2015    [provider]  melatonin 1 MG TABS tablet Take 12 mg by mouth at bedtime.    [provider]  memantine (NAMENDA) 5 MG tablet Take 5 mg by mouth 2 (two) times daily. 08/14/20   [provider]  Mouthwashes (MOUTH RINSE) LIQD solution 15 mLs by Mouth Rinse route in the morning, at noon, in the evening, and at bedtime. 11/23/21   Ezekiel Slocumb, DO  Multiple Vitamin (MULTIVITAMIN WITH MINERALS) TABS tablet Take 1 tablet by mouth daily in the afternoon.    [provider]  octreotide 500 mcg in sodium chloride 0.9 % 250 mL Inject 50 mcg/hr into the vein continuous.  11/21/21   Max Sane, MD  pantoprazole (PROTONIX) 80 mg/100 mL (0.8 mg/mL) SOLN Inject 8 mg/hr into the vein continuous. 11/21/21   Max Sane, MD  tamsulosin (FLOMAX) 0.4 MG CAPS capsule Take 0.4 mg by mouth daily in the afternoon. Reported on 09/01/2015    [provider]  vitamin B-12 (CYANOCOBALAMIN) 1000 MCG tablet Take 1,000 mcg by mouth daily in the afternoon.    [provider]    Current Facility-Administered  Medications  Medication Dose Route Frequency Provider Last Rate Last Admin   cefTRIAXone (ROCEPHIN) 2 g in sodium chloride 0.9 % 100 mL IVPB  2 g Intravenous Q24H Opyd, Ilene Qua, MD 200 mL/hr at 11/24/21 1021 2 g at 11/24/21 1021   dextrose 5 % and 0.45 % NaCl with KCl 10 mEq/L infusion   Intravenous Continuous Opyd, Ilene Qua, MD 75 mL/hr at 11/24/21 0016 New Bag at 11/24/21 0016   octreotide (SANDOSTATIN) 500 mcg in sodium chloride 0.9 % 250 mL (2 mcg/mL) infusion  50 mcg/hr Intravenous Continuous Opyd, Ilene Qua, MD 25 mL/hr at 11/24/21 1243 50 mcg/hr at 11/24/21 1243   pantoprazole (PROTONIX) injection 40 mg  40 mg Intravenous Q12H Opyd, Ilene Qua, MD   40 mg at 11/24/21 1013   potassium chloride (KLOR-CON) packet 40 mEq  40 mEq Oral Once Thurnell Lose, MD       sodium chloride flush (NS) 0.9 % injection 3 mL  3 mL Intravenous Q12H Opyd, Ilene Qua, MD   3 mL at 11/24/21 1013    Allergies as of 11/21/2021   (No Known Allergies)    Family History  Problem Relation Age of Onset   Heart disease Mother    Cancer Father    Kidney disease Neg Hx    Prostate cancer Neg Hx     Social History   Socioeconomic History   Marital status: Single    Spouse name: Not on file   Number of children: Not on file   Years of education: Not on file   Highest education level: Not on file  Occupational History   Not on file  Tobacco Use   Smoking status: Never   Smokeless tobacco: Never  Vaping Use   Vaping Use: Never used  Substance and Sexual Activity   Alcohol use: Not Currently    Alcohol/week: 0.0 standard drinks of alcohol   Drug use: Not Currently    Types: Marijuana    Comment: years ago   Sexual activity: Not on file  Other Topics Concern   Not on file  Social History Narrative   Not on file   Social Determinants of Health   Financial Resource Strain: Not on file  Food Insecurity: Not on file  Transportation Needs: Not on file  Physical Activity: Not on file  Stress:  Not on file  Social Connections: Not on file  Intimate Partner Violence: Not on file    Review of Systems: See HPI, limited ROS as patient has dementia, sister provided ROS history.  Physical Exam: Vital signs in last 24 hours: Temp:  [98 F (36.7 C)-98.7 F (37.1 C)] 98.3 F (36.8 C) (06/16 0336) Pulse Rate:  [55-73] 60 (06/16 0336) Resp:  [12-19] 16 (06/16 0336) BP: (106-158)/(65-85) 113/70 (06/16 0336) SpO2:  [98 %-100 %] 100 % (06/16 0336) Weight:  [88.9 kg] 88.9 kg (06/15 2051) Last BM Date :  (PTA) General:  Alert, no verbal communication at this time, follows simple commands, in NAD.  Head:  Normocephalic and atraumatic. Eyes:  No scleral icterus. Conjunctiva pink. Ears:  Normal auditory acuity. Nose:  No deformity, discharge or lesions. Mouth: Missing dentition. No ulcers or lesions.  Neck:  Supple. No lymphadenopathy or thyromegaly.  Lungs: Breath sounds clear throughout.  Heart:  RRR, no murmurs.  Abdomen:  Soft, nontender. Nondistended. No palpable mass. No HSM. Rectal: Deferred. Musculoskeletal:  Symmetrical without gross deformities.  Pulses:  Normal pulses noted. Extremities:  Without clubbing or edema. Neurologic:  Alert to name. Moves all extremities. Hands in protective mittens. Follows simple commands.  Skin:  Intact without significant lesions or rashes. Psych:  Alert and cooperative. Normal mood and affect.  Intake/Output from previous day: No intake/output data recorded. Intake/Output this shift: No intake/output data recorded.  Lab Results: Recent Labs    11/22/21 0541 11/22/21 1002 11/23/21 0853 11/23/21 2340 11/24/21 0731  WBC 5.5  --   --  5.3 4.7  HGB 7.7*   < > 7.3* 7.8* 7.7*  HCT 24.3*   < > 23.6* 25.5* 25.4*  PLT 126*  --   --  132* 102*   < > = values in this interval not displayed.   BMET Recent Labs    11/22/21 0541 11/23/21 0144  NA 141 144  K 3.3* 3.5  CL 108 111  CO2 28 29  GLUCOSE 110* 107*  BUN 18 21  CREATININE  1.14 1.15  CALCIUM 8.6* 8.5*   LFT No results for input(s): "PROT", "ALBUMIN", "AST", "ALT", "ALKPHOS", "BILITOT", "BILIDIR", "IBILI" in the last 72 hours. PT/INR No results for input(s): "LABPROT", "INR" in the last 72 hours. Hepatitis Panel No results for input(s): "HEPBSAG", "HCVAB", "HEPAIGM", "HEPBIGM" in the last 72 hours.    Studies/Results: No results found.  IMPRESSION/PLAN:  65) 71 year old male with a GI bleed and profound anemia.  Admission hemoglobin 3.7.  Transfused 4 units of PRBCs -> Hg 7.7. On octreotide infusion and PPI IV bid.  Received IV iron x1.  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  Patient transferred to [facility] today for possible BRTO. No further hematemesis. Hemodynamically stable.  -NPO -Continue Rocephin 2 g IV every 24 -Continue Octreotide 50 mcg/hour infusion -Continue Pantoprazole 40 mg IV every 12 hours -Await IR recommendations regarding BRTO  -CBC in am -Transfuse for Hg level < 7  2) Reported history of cirrhosis. Liver disease likely from past alcohol use disorder. Reported past history of chronic hepatitis C, however, Hep C RNA undetectable 11/20/2021.  He possibly had hepatitis C in the past which cleared spontaneously. Abd/pelvic CTA showed a normal liver, no evidence of nodular contour/cirrhosis or hepatoma.  -Follow up with [facility] after hospital discharge   3) Thrombocytopenia, secondary to liver disease. CTA showed evidence of a small region of splenic infarct without splenomegaly.  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  INR 1.3 on 6/13. -INR   5) Abd/pelvic CTA showed abnormal retroperitoneal lymphadenopathy and pancreatic tail abnormality  6) History of prostate cancer   7) Alzheimer's disease, advance dementia   Await further recommendation per Dr. Carmin Muskrat Dorathy Daft   11/24/2021, 3:57PM

## 2021-11-24 NOTE — Progress Notes (Addendum)
PROGRESS NOTE                                                                                                                                                                                                             Patient Demographics:    Daniel Knapp, is a 71 y.o. male, DOB - July 20, 1950, BJS:[id number]  Outpatient Primary MD for the patient is Daniel Late, MD    LOS - 1  Admit date - 11/23/2021    GI Bleed, from [facility]      Brief Narrative (HPI from H&P)  71 y.o. male with medical history significant for dementia, untreated hepatitis C, cirrhosis, and hypertension who presented to [facility] on 11/20/2021 with after he was found down at home with alteration in his mental status.  Patient had been less responsive for the preceding 3 days, had some bright red blood per rectum noted by his caretaker who attributed this to hemorrhoids, and had not seen any vomiting or hematemesis.     Subjective:    Daniel Knapp today has, No headache, No chest pain, No abdominal pain - No Nausea, No new weakness tingling or numbness, no SOB, but confused   Assessment  & Plan :   1. 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  H&H currently stable.  On IV PPI and octreotide. Abdominal ultrasound rules out ascites hence will stop Rocephin if no further signs of infection, GI and IR have been consulted continue to monitor H&H.    2. Cirrhosis  - Suspected secondary to untreated hepatitis C  - MELD score is 12  - Appears compensated abdominal ultrasound does not confirm any ascites.   3. Hypertension  - Treat as-needed only for now    4.  History of of underlying Alzheimer's dementia  - Delirium precautions     5. IDA  - Received iron infusion at [facility]          Condition - Extremely Guarded  Family Communication  :   Daniel Knapp [phone] on 11/24/2021  Code Status :  Full  Consults  :  GI, IR  PUD Prophylaxis : PPI   Procedures  :     CT -   VASCULAR 1. No evidence of active bleeding. 2. No evidence of aneurysm, dissection or other acute vascular abnormality.   NON-VASCULAR 1. Abnormal appearance of the pancreatic tail which is now inseparable from the adjacent and abutting splenic parenchyma. There is inflammatory stranding in the region as well as slight interval enlargement of a dystrophic calcification compared to prior imaging from 2017. Further, there is new wedge-shaped infarct in the same region. Differential considerations include focal pancreatitis of the pancreatic tail with involvement of the spleen including a small splenic infarct versus pancreatic tail neoplasm with direct splenic invasion. Small volume reactive fluid in the peripancreatic soft tissues extending along the left pericolic gutter and low within the anatomic pelvis. 2. Abnormal retroperitoneal lymphadenopathy predominantly in the right para aortic space. Differential considerations include lymphoma versus metastatic prostate or bladder cancer. 3. Similar appearance of nodular extension of the prostate gland into the right bladder trigone compared to prior imaging from 02/21/2016. If anything, findings are slightly less conspicuous than previously seen. 4. Colonic diverticular disease without CT evidence of active inflammation. 5. Bilateral nonobstructing nephrolithiasis. 6. Additional ancillary findings as above.      Disposition Plan  :    Status is: Inpatient  DVT Prophylaxis  :    SCDs Start: 11/23/21 2309    Lab Results  Component Value Date   PLT 102 (L) 11/24/2021    Diet :  Diet Order             Diet NPO time specified Except for: Ice Chips  Diet effective now                     Inpatient Medications  Scheduled Meds:  pantoprazole (PROTONIX) IV  40 mg Intravenous Q12H   sodium chloride flush  3 mL Intravenous Q12H   Continuous Infusions:  cefTRIAXone (ROCEPHIN)  IV 2 g (11/24/21 1021)   dextrose 5 % and 0.45 % NaCl with KCl 10 mEq/L 75 mL/hr at 11/24/21 0016   octreotide (SANDOSTATIN) 500 mcg in sodium chloride 0.9 % 250 mL (2 mcg/mL) infusion 50 mcg/hr (11/24/21 0017)   PRN Meds:.  Antibiotics  :    Anti-infectives (From admission, onward)    Start     Dose/Rate Route Frequency Ordered Stop   11/24/21 1000  cefTRIAXone (ROCEPHIN) 2 g in sodium chloride 0.9 % 100 mL IVPB        2 g 200 mL/hr over 30 Minutes Intravenous Every 24 hours 11/23/21 2314          Time Spent in minutes  30   Daniel Knapp M.D on 11/24/2021 at 12:14 PM  To page go to [email or web address]   [facility] -  Office  [phone]  See all Orders from today for further details    Objective:   Vitals:   11/23/21 2051 11/23/21 2334 11/24/21 0016 11/24/21 0336  BP: (!) 158/85 106/67  113/70  Pulse: 73 (!) 56 (!) 57 60  Resp: '19 12 12 16  '$ Temp: 98.1 F (36.7 C) 98 F (36.7 C)  98.3 F (36.8 C)  TempSrc: Oral Axillary  Axillary  SpO2: 98% 100% 100% 100%  Weight: 88.9 kg     Height: '6\' 2"'$  (1.88 m)       Wt Readings from Last 3 Encounters:  11/23/21 88.9 kg  11/20/21 90 kg  09/30/20 90.7 kg  No intake or output data in the 24 hours ending 11/24/21 1214   Physical Exam  Awake but confused No new F.N deficits,   [place].AT,PERRAL Supple Neck, No JVD,   Symmetrical Chest wall movement, Good air movement bilaterally, CTAB RRR,No Gallops,Rubs or new Murmurs,  +ve B.Sounds, Abd Soft, No tenderness,   No Cyanosis, Clubbing or edema       Data Review:    CBC Recent Labs  Lab 11/20/21 0922 11/20/21 1700 11/22/21 0541 11/22/21 1002 11/22/21 2156 11/23/21 0144 11/23/21 0853 11/23/21 2340 11/24/21 0731  WBC 9.4  --  5.5  --   --   --   --  5.3 4.7   HGB 3.7*   < > 7.7*   < > 7.3* 7.5* 7.3* 7.8* 7.7*  HCT 13.4*   < > 24.3*   < > 24.0* 24.7* 23.6* 25.5* 25.4*  PLT 131*  --  126*  --   --   --   --  132* 102*  MCV 81.2  --  83.5  --   --   --   --  87.3 88.2  MCH 22.4*  --  26.5  --   --   --   --  26.7 26.7  MCHC 27.6*  --  31.7  --   --   --   --  30.6 30.3  RDW 18.5*  --  17.3*  --   --   --   --  18.2* 18.4*   < > = values in this interval not displayed.    Electrolytes Recent Labs  Lab 11/20/21 0922 11/20/21 1056 11/21/21 0502 11/22/21 0541 11/23/21 0144  NA 145  --  144 141 144  K 3.2*  --  3.5 3.3* 3.5  CL 116*  --  110 108 111  CO2 22  --  '28 28 29  '$ GLUCOSE 113*  --  113* 110* 107*  BUN 25*  --  '16 18 21  '$ CREATININE 0.94  --  0.93 1.14 1.15  CALCIUM 8.3*  --  8.7* 8.6* 8.5*  AST 22  --  28  --   --   ALT 13  --  15  --   --   ALKPHOS 43  --  54  --   --   BILITOT 0.5  --  1.3*  --   --   ALBUMIN 3.1*  --  3.3*  --   --   MG 1.9  --   --   --   --   INR  --  1.4* 1.3*  --   --   AMMONIA  --  <10  --   --   --      Micro Results No results found for this or any previous visit (from the past 240 hour(s)).  Radiology Reports CT Angio Abd/Pel w/ and/or w/o  Result Date: 11/21/2021 CLINICAL DATA:  Severe symptomatic anemia, compensated cirrhosis EXAM: CTA ABDOMEN AND PELVIS WITHOUT AND WITH CONTRAST TECHNIQUE: Multidetector CT imaging of the abdomen and pelvis was performed using the standard protocol during bolus administration of intravenous contrast. Multiplanar reconstructed images and MIPs were obtained and reviewed to evaluate the vascular anatomy. RADIATION DOSE REDUCTION: This exam was performed according to the departmental dose-optimization program which includes automated exposure control, adjustment of the mA and/or kV according to patient size and/or use of iterative reconstruction technique. CONTRAST:  120m OMNIPAQUE IOHEXOL 350 MG/ML SOLN COMPARISON:  Prior CT abdomen/pelvis 02/21/2016 FINDINGS:  VASCULAR Aorta: Normal caliber aorta without aneurysm, dissection, vasculitis or significant stenosis. Celiac: Patent without evidence of aneurysm, dissection, vasculitis or significant stenosis. Lateral segmental branch of the left hepatic artery is replaced to the left gastric artery. SMA: Patent without evidence of aneurysm, dissection, vasculitis or significant stenosis. Renals: Both main renal arteries are patent without evidence of aneurysm, dissection, vasculitis, fibromuscular dysplasia or significant stenosis. Accessory left lower pole renal artery. IMA: Patent without evidence of aneurysm, dissection, vasculitis or significant stenosis. Inflow: Patent without evidence of aneurysm, dissection, vasculitis or significant stenosis. Proximal Outflow: Bilateral common femoral and visualized portions of the superficial and profunda femoral arteries are patent without evidence of aneurysm, dissection, vasculitis or significant stenosis. Veins: No focal venous abnormality. Review of the MIP images confirms the above findings. NON-VASCULAR Lower chest: Trace atherosclerotic calcifications along the coronary arteries. Dependent atelectasis in both lower lobes. Additionally, there are is mild focal ground-glass attenuation airspace opacity in the dependent aspect of the left lower lobe. No pneumothorax. No suspicious nodule. Hepatobiliary: No focal liver abnormality is seen. No gallstones, gallbladder wall thickening, or biliary dilatation. Pancreas: Abnormal appearance of the pancreatic tail which demonstrates inflammatory stranding which is now contiguous with the splenic parenchyma. Additionally, there is focal wedge-shaped hypoattenuation within the spleen extending from the pancreatic tail. Trace fluid identified in the peripancreatic soft tissues and extending into the left pericolic gutter. Spleen: Wedge-shaped hypoattenuation extending from the splenic hilum where it abuts the pancreatic tail to the spleen  surface consistent with a small region of infarct. Adrenals/Urinary Tract: Normal adrenal glands. Punctate nonobstructing stone in the interpolar right kidney measures up to 4 mm. Punctate nonobstructing stone in the lower pole of the left kidney measures 3 mm. Ureters are unremarkable. Similar appearance of prostatomegaly with nodular extension into the bladder trigone. Stomach/Bowel: Colonic diverticular disease without CT evidence of active inflammation. No evidence of obstruction or focal bowel wall thickening. Normal appendix in the right lower quadrant. The terminal ileum is unremarkable. Lymphatic: Right para-aortic lymphadenopathy. A cluster of nodes measuring 1.3, 1.71.5 cm in short axis are visible in the right para aortic space (image 120 series 5). Left internal iliac station lymph node is enlarged at approximately 1.4 cm in short axis (image 176 series 5). Reproductive: Similar appearance of prostatomegaly with nodular extension into the right bladder trigone. Other: Repaired fat containing umbilical hernia. Trace ascites in the region of the pancreatic tail extending into the left pericolic gutter. Trace free fluid visible in the pelvis is well. Musculoskeletal: No acute or significant osseous findings. Advanced lumbar degenerative disc disease and facet arthropathy. IMPRESSION: VASCULAR 1. No evidence of active bleeding. 2. No evidence of aneurysm, dissection or other acute vascular abnormality. NON-VASCULAR 1. Abnormal appearance of the pancreatic tail which is now inseparable from the adjacent and abutting splenic parenchyma. There is inflammatory stranding in the region as well as slight interval enlargement of a dystrophic calcification compared to prior imaging from 2017. Further, there is new wedge-shaped infarct in the same region. Differential considerations include focal pancreatitis of the pancreatic tail with involvement of the spleen including a small splenic infarct versus pancreatic tail  neoplasm with direct splenic invasion. Small volume reactive fluid in the peripancreatic soft tissues extending along the left pericolic gutter and low within the anatomic pelvis. 2. Abnormal retroperitoneal lymphadenopathy predominantly in the right para aortic space. Differential considerations include lymphoma versus metastatic prostate or bladder cancer. 3. Similar appearance of nodular extension of the prostate gland into the right bladder  trigone compared to prior imaging from 02/21/2016. If anything, findings are slightly less conspicuous than previously seen. 4. Colonic diverticular disease without CT evidence of active inflammation. 5. Bilateral nonobstructing nephrolithiasis. 6. Additional ancillary findings as above. Electronically Signed   By: Jacqulynn Cadet M.D.   On: 11/21/2021 16:52   US Abdomen Limited  Result Date: 11/20/2021 CLINICAL DATA:  Ascites, cirrhosis. EXAM: LIMITED ABDOMEN ULTRASOUND FOR ASCITES TECHNIQUE: Limited ultrasound survey for ascites was performed in all four abdominal quadrants. COMPARISON:  None Available. FINDINGS: Four-quadrant ultrasound demonstrates no significant abdominal ascites. IMPRESSION: No significant abdominal ascites. Electronically Signed   By: San Morelle M.D.   On: 11/20/2021 14:34

## 2021-11-25 ENCOUNTER — Inpatient Hospital Stay (HOSPITAL_COMMUNITY): Payer: Medicare HMO

## 2021-11-25 DIAGNOSIS — K922 Gastrointestinal hemorrhage, unspecified: Secondary | ICD-10-CM | POA: Diagnosis not present

## 2021-11-25 HISTORY — PX: IR US GUIDE VASC ACCESS RIGHT: IMG2390

## 2021-11-25 HISTORY — PX: IR ANGIOGRAM SELECTIVE EACH ADDITIONAL VESSEL: IMG667

## 2021-11-25 HISTORY — PX: IR EMBO ART  VEN HEMORR LYMPH EXTRAV  INC GUIDE ROADMAPPING: IMG5450

## 2021-11-25 HISTORY — PX: IR TRANSHEPATIC PORTOGRAM WO HEMO: IMG691

## 2021-11-25 LAB — CBC
HCT: 31 % — ABNORMAL LOW (ref 39.0–52.0)
Hemoglobin: 9.7 g/dL — ABNORMAL LOW (ref 13.0–17.0)
MCH: 27.2 pg (ref 26.0–34.0)
MCHC: 31.3 g/dL (ref 30.0–36.0)
MCV: 87.1 fL (ref 80.0–100.0)
Platelets: 154 10*3/uL (ref 150–400)
RBC: 3.56 MIL/uL — ABNORMAL LOW (ref 4.22–5.81)
RDW: 18.6 % — ABNORMAL HIGH (ref 11.5–15.5)
WBC: 6.6 10*3/uL (ref 4.0–10.5)
nRBC: 0 % (ref 0.0–0.2)

## 2021-11-25 LAB — LIPASE, BLOOD: Lipase: 125 U/L — ABNORMAL HIGH (ref 11–51)

## 2021-11-25 LAB — COMPREHENSIVE METABOLIC PANEL
ALT: 28 U/L (ref 0–44)
AST: 32 U/L (ref 15–41)
Albumin: 3.4 g/dL — ABNORMAL LOW (ref 3.5–5.0)
Alkaline Phosphatase: 85 U/L (ref 38–126)
Anion gap: 11 (ref 5–15)
BUN: 5 mg/dL — ABNORMAL LOW (ref 8–23)
CO2: 27 mmol/L (ref 22–32)
Calcium: 8.8 mg/dL — ABNORMAL LOW (ref 8.9–10.3)
Chloride: 100 mmol/L (ref 98–111)
Creatinine, Ser: 1.07 mg/dL (ref 0.61–1.24)
GFR, Estimated: >60 mL/min (ref >60)
Glucose, Bld: 116 mg/dL — ABNORMAL HIGH (ref 70–99)
Potassium: 3.3 mmol/L — ABNORMAL LOW (ref 3.5–5.1)
Sodium: 138 mmol/L (ref 135–145)
Total Bilirubin: 0.8 mg/dL (ref 0.3–1.2)
Total Protein: 7.9 g/dL (ref 6.5–8.1)

## 2021-11-25 LAB — PROTIME-INR
INR: 1.2 (ref 0.8–1.2)
Prothrombin Time: 15.1 seconds (ref 11.4–15.2)

## 2021-11-25 LAB — MAGNESIUM: Magnesium: 2.2 mg/dL (ref 1.7–2.4)

## 2021-11-25 MED ORDER — MIDAZOLAM HCL 2 MG/2ML IJ SOLN
INTRAMUSCULAR | Status: AC | PRN
  Administered 2021-11-25 (×3): 1 mg via INTRAVENOUS

## 2021-11-25 MED ORDER — LIDOCAINE HCL 1 % IJ SOLN
INTRAMUSCULAR | Status: AC
  Filled 2021-11-25: qty 20

## 2021-11-25 MED ORDER — MIDAZOLAM HCL 2 MG/2ML IJ SOLN
INTRAMUSCULAR | Status: AC
  Filled 2021-11-25: qty 4

## 2021-11-25 MED ORDER — POTASSIUM CHLORIDE CRYS ER 20 MEQ PO TBCR: 40.0000 meq | EXTENDED_RELEASE_TABLET | Freq: Once | ORAL | Status: DC

## 2021-11-25 MED ORDER — IOHEXOL 300 MG/ML  SOLN
100.0000 mL | Freq: Once | INTRAMUSCULAR | Status: AC | PRN
  Administered 2021-11-25: 75 mL via INTRAVENOUS

## 2021-11-25 MED ORDER — FENTANYL CITRATE (PF) 100 MCG/2ML IJ SOLN
INTRAMUSCULAR | Status: AC
  Filled 2021-11-25: qty 4

## 2021-11-25 MED ORDER — FENTANYL CITRATE (PF) 100 MCG/2ML IJ SOLN
INTRAMUSCULAR | Status: AC | PRN
  Administered 2021-11-25: 50 ug via INTRAVENOUS
  Administered 2021-11-25 (×2): 25 ug via INTRAVENOUS

## 2021-11-25 MED ORDER — POTASSIUM CHLORIDE 2 MEQ/ML IV SOLN
INTRAVENOUS | Status: AC
  Filled 2021-11-25: qty 1000

## 2021-11-25 MED ORDER — GELATIN ABSORBABLE 12-7 MM EX MISC
CUTANEOUS | Status: AC
  Filled 2021-11-25: qty 1

## 2021-11-25 NOTE — Progress Notes (Signed)
PROGRESS NOTE                                                                                                                                                                                                             Patient Demographics:    Daniel Knapp, is a 71 y.o. male, DOB - [birth year], ZJQ:[id number]  Outpatient Primary MD for the patient is Derinda Late, MD    LOS - 2  Admit date - 11/23/2021    GI Bleed, from [facility]      Brief Narrative (HPI from H&P)  71 y.o. male with medical history significant for dementia, untreated hepatitis C, cirrhosis, and hypertension who presented to [facility] on 11/20/2021 with after he was found down at home with alteration in his mental status.  Patient had been less responsive for the preceding 3 days, had some bright red blood per rectum noted by his caretaker who attributed this to hemorrhoids, and had not seen any vomiting or hematemesis.     Subjective:   Patient in bed, appears comfortable, denies any headache, no fever, no chest pain or pressure, no shortness of breath , no abdominal pain. No new focal weakness.   Assessment  & Plan :   1. 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  H&H currently stable.  On IV PPI and octreotide. Abdominal ultrasound rules out ascites, currently he is due for obliteration of the varices by IR on 11/25/2021 Case discussed with GI as well.  Will stop Rocephin 1 to 2 days after procedure if stable.  Continue to monitor H&H.  No signs of ongoing bleeding   2. Cirrhosis  - Suspected secondary to untreated hepatitis C  - MELD score is 12  - Appears compensated abdominal ultrasound does not confirm any ascites.  Outpatient  follow-up with GI postdischarge.   3. Hypertension  - Treat as-needed only for now    4.  History of of underlying Alzheimer's dementia  - Delirium precautions    5. IDA  - Received iron infusion at [facility]  Condition - Extremely Guarded  Family Communication  :   Agnes Teddrick [phone] on 11/24/2021  Code Status :  Full  Consults  :  GI, IR  PUD Prophylaxis : PPI   Procedures  :     CT -   VASCULAR 1. No evidence of active bleeding. 2. No evidence of aneurysm, dissection or other acute vascular abnormality.   NON-VASCULAR 1. Abnormal appearance of the pancreatic tail which is now inseparable from the adjacent and abutting splenic parenchyma. There is inflammatory stranding in the region as well as slight interval enlargement of a dystrophic calcification compared to prior imaging from 2017. Further, there is new wedge-shaped infarct in the same region. Differential considerations include focal pancreatitis of the pancreatic tail with involvement of the spleen including a small splenic infarct versus pancreatic tail neoplasm with direct splenic invasion. Small volume reactive fluid in the peripancreatic soft tissues extending along the left pericolic gutter and low within the anatomic pelvis. 2. Abnormal retroperitoneal lymphadenopathy predominantly in the right para aortic space. Differential considerations include lymphoma versus metastatic prostate or bladder cancer. 3. Similar appearance of nodular extension of the prostate gland into the right bladder trigone compared to prior imaging from 02/21/2016. If anything, findings are slightly less conspicuous than previously seen. 4. Colonic diverticular disease without CT evidence of active inflammation. 5. Bilateral nonobstructing nephrolithiasis. 6. Additional ancillary findings as above.      Disposition Plan  :    Status is: Inpatient  DVT Prophylaxis  :    SCDs Start: 11/23/21 2309    Lab Results  Component  Value Date   PLT 154 11/25/2021    Diet :  Diet Order             Diet NPO time specified Except for: Sips with Meds  Diet effective midnight                    Inpatient Medications  Scheduled Meds:  pantoprazole (PROTONIX) IV  40 mg Intravenous Q12H   sodium chloride flush  3 mL Intravenous Q12H   Continuous Infusions:  cefTRIAXone (ROCEPHIN)  IV 2 g (11/24/21 1021)   lactated ringers 1,000 mL with potassium chloride 40 mEq infusion 75 mL/hr at 11/25/21 0644   octreotide (SANDOSTATIN) 500 mcg in sodium chloride 0.9 % 250 mL (2 mcg/mL) infusion 50 mcg/hr (11/25/21 0013)   PRN Meds:.  Antibiotics  :    Anti-infectives (From admission, onward)    Start     Dose/Rate Route Frequency Ordered Stop   11/24/21 1000  cefTRIAXone (ROCEPHIN) 2 g in sodium chloride 0.9 % 100 mL IVPB        2 g 200 mL/hr over 30 Minutes Intravenous Every 24 hours 11/23/21 2314          Time Spent in minutes  30   Lala Lund M.D on 11/25/2021 at 9:54 AM  To page go to [email or web address]   [facility] -  Office  [phone]  See all Orders from today for further details    Objective:   Vitals:   11/24/21 2000 11/24/21 2339 11/25/21 0308 11/25/21 0800  BP: 103/62 (!) 145/72 140/78 136/74  Pulse: (!) 55 66 65 (!) 57  Resp: '11 17 15 10  '$ Temp: 98 F (36.7 C) 97.8 F (36.6 C) 97.8 F (36.6 C) (!) 97.5 F (36.4 C)  TempSrc: Axillary Oral Oral Axillary  SpO2: 100% 100%    Weight:      Height:  Wt Readings from Last 3 Encounters:  11/23/21 88.9 kg  11/20/21 90 kg  09/30/20 90.7 kg     Intake/Output Summary (Last 24 hours) at 11/25/2021 0954 Last data filed at 11/25/2021 0308 Gross per 24 hour  Intake 1458.05 ml  Output 1200 ml  Net 258.05 ml     Physical Exam  Awake pleasantly confused, No new F.N deficits, Normal affect [place].AT,PERRAL Supple Neck, No JVD,   Symmetrical Chest wall movement, Good air movement bilaterally, CTAB RRR,No Gallops, Rubs or new  Murmurs,  +ve B.Sounds, Abd Soft, No tenderness,   No Cyanosis, Clubbing or edema        Data Review:    CBC Recent Labs  Lab 11/23/21 2340 11/24/21 0731 11/24/21 1524 11/24/21 2335 11/25/21 0430  WBC 5.3 4.7 6.2 6.6 6.6  HGB 7.8* 7.7* 9.0* 8.6* 9.7*  HCT 25.5* 25.4* 28.9* 28.4* 31.0*  PLT 132* 102* 145* 134* 154  MCV 87.3 88.2 87.0 87.9 87.1  MCH 26.7 26.7 27.1 26.6 27.2  MCHC 30.6 30.3 31.1 30.3 31.3  RDW 18.2* 18.4* 18.6* 18.8* 18.6*    Electrolytes Recent Labs  Lab 11/20/21 0922 11/20/21 1056 11/21/21 0502 11/22/21 0541 11/23/21 0144 11/25/21 0430  NA 145  --  144 141 144 138  K 3.2*  --  3.5 3.3* 3.5 3.3*  CL 116*  --  110 108 111 100  CO2 22  --  '28 28 29 27  '$ GLUCOSE 113*  --  113* 110* 107* 116*  BUN 25*  --  '16 18 21 '$ 5*  CREATININE 0.94  --  0.93 1.14 1.15 1.07  CALCIUM 8.3*  --  8.7* 8.6* 8.5* 8.8*  AST 22  --  28  --   --  32  ALT 13  --  15  --   --  28  ALKPHOS 43  --  54  --   --  85  BILITOT 0.5  --  1.3*  --   --  0.8  ALBUMIN 3.1*  --  3.3*  --   --  3.4*  MG 1.9  --   --   --   --  2.2  INR  --  1.4* 1.3*  --   --  1.2  AMMONIA  --  <10  --   --   --   --      Micro Results No results found for this or any previous visit (from the past 240 hour(s)).  Radiology Reports CT Angio Abd/Pel w/ and/or w/o  Result Date: 11/21/2021 CLINICAL DATA:  Severe symptomatic anemia, compensated cirrhosis EXAM: CTA ABDOMEN AND PELVIS WITHOUT AND WITH CONTRAST TECHNIQUE: Multidetector CT imaging of the abdomen and pelvis was performed using the standard protocol during bolus administration of intravenous contrast. Multiplanar reconstructed images and MIPs were obtained and reviewed to evaluate the vascular anatomy. RADIATION DOSE REDUCTION: This exam was performed according to the departmental dose-optimization program which includes automated exposure control, adjustment of the mA and/or kV according to patient size and/or use of iterative reconstruction  technique. CONTRAST:  159m OMNIPAQUE IOHEXOL 350 MG/ML SOLN COMPARISON:  Prior CT abdomen/pelvis 02/21/2016 FINDINGS: VASCULAR Aorta: Normal caliber aorta without aneurysm, dissection, vasculitis or significant stenosis. Celiac: Patent without evidence of aneurysm, dissection, vasculitis or significant stenosis. Lateral segmental branch of the left hepatic artery is replaced to the left gastric artery. SMA: Patent without evidence of aneurysm, dissection, vasculitis or significant stenosis. Renals: Both main renal arteries are patent without evidence of  aneurysm, dissection, vasculitis, fibromuscular dysplasia or significant stenosis. Accessory left lower pole renal artery. IMA: Patent without evidence of aneurysm, dissection, vasculitis or significant stenosis. Inflow: Patent without evidence of aneurysm, dissection, vasculitis or significant stenosis. Proximal Outflow: Bilateral common femoral and visualized portions of the superficial and profunda femoral arteries are patent without evidence of aneurysm, dissection, vasculitis or significant stenosis. Veins: No focal venous abnormality. Review of the MIP images confirms the above findings. NON-VASCULAR Lower chest: Trace atherosclerotic calcifications along the coronary arteries. Dependent atelectasis in both lower lobes. Additionally, there are is mild focal ground-glass attenuation airspace opacity in the dependent aspect of the left lower lobe. No pneumothorax. No suspicious nodule. Hepatobiliary: No focal liver abnormality is seen. No gallstones, gallbladder wall thickening, or biliary dilatation. Pancreas: Abnormal appearance of the pancreatic tail which demonstrates inflammatory stranding which is now contiguous with the splenic parenchyma. Additionally, there is focal wedge-shaped hypoattenuation within the spleen extending from the pancreatic tail. Trace fluid identified in the peripancreatic soft tissues and extending into the left pericolic gutter.  Spleen: Wedge-shaped hypoattenuation extending from the splenic hilum where it abuts the pancreatic tail to the spleen surface consistent with a small region of infarct. Adrenals/Urinary Tract: Normal adrenal glands. Punctate nonobstructing stone in the interpolar right kidney measures up to 4 mm. Punctate nonobstructing stone in the lower pole of the left kidney measures 3 mm. Ureters are unremarkable. Similar appearance of prostatomegaly with nodular extension into the bladder trigone. Stomach/Bowel: Colonic diverticular disease without CT evidence of active inflammation. No evidence of obstruction or focal bowel wall thickening. Normal appendix in the right lower quadrant. The terminal ileum is unremarkable. Lymphatic: Right para-aortic lymphadenopathy. A cluster of nodes measuring 1.3, 1.71.5 cm in short axis are visible in the right para aortic space (image 120 series 5). Left internal iliac station lymph node is enlarged at approximately 1.4 cm in short axis (image 176 series 5). Reproductive: Similar appearance of prostatomegaly with nodular extension into the right bladder trigone. Other: Repaired fat containing umbilical hernia. Trace ascites in the region of the pancreatic tail extending into the left pericolic gutter. Trace free fluid visible in the pelvis is well. Musculoskeletal: No acute or significant osseous findings. Advanced lumbar degenerative disc disease and facet arthropathy. IMPRESSION: VASCULAR 1. No evidence of active bleeding. 2. No evidence of aneurysm, dissection or other acute vascular abnormality. NON-VASCULAR 1. Abnormal appearance of the pancreatic tail which is now inseparable from the adjacent and abutting splenic parenchyma. There is inflammatory stranding in the region as well as slight interval enlargement of a dystrophic calcification compared to prior imaging from 2017. Further, there is new wedge-shaped infarct in the same region. Differential considerations include focal  pancreatitis of the pancreatic tail with involvement of the spleen including a small splenic infarct versus pancreatic tail neoplasm with direct splenic invasion. Small volume reactive fluid in the peripancreatic soft tissues extending along the left pericolic gutter and low within the anatomic pelvis. 2. Abnormal retroperitoneal lymphadenopathy predominantly in the right para aortic space. Differential considerations include lymphoma versus metastatic prostate or bladder cancer. 3. Similar appearance of nodular extension of the prostate gland into the right bladder trigone compared to prior imaging from 02/21/2016. If anything, findings are slightly less conspicuous than previously seen. 4. Colonic diverticular disease without CT evidence of active inflammation. 5. Bilateral nonobstructing nephrolithiasis. 6. Additional ancillary findings as above. Electronically Signed   By: Jacqulynn Cadet M.D.   On: 11/21/2021 16:52

## 2021-11-25 NOTE — Procedures (Signed)
Interventional Radiology Procedure Note  Procedure:  1) Percutaneous, transhepatic portal venogram 2) Catheterization and venography of posterior gastric vein 3) Hepatic parenchymal track coil embolization  Findings: Please refer to procedural dictation for full description. Posterior gastric vein varicosities small with multifocal draining veins on balloon occlusion venogram.  Difficult angulation into PGV preventing introduction of tools for sclerotherapy.  Given these findings, procedure was aborted.  Complications: None immediate  Estimated Blood Loss: < 5 mL  Recommendations: Strict 3 hour bedrest.   Ruthann Cancer, MD Pager: [phone]

## 2021-11-25 NOTE — TOC Progression Note (Signed)
Transition of Care [facility]) - Progression Note    Patient Details  Name: PARTICK MUSSELMAN MRN: [id number] Date of Birth: September 27, 1950  Transition of Care [facility] CM/SW Contact  Emeterio Reeve, [place] Phone Number: 11/25/2021, 10:54 AM  Clinical Narrative:     Pt has no bed offers at this time.  Expected Discharge Plan: [place] Barriers to Discharge: Continued Medical Work up, SNF Pending bed offer, Ship broker  Expected Discharge Plan and Services Expected Discharge Plan: [place] In-house Referral: Clinical Social Work   Post Acute Care Choice: [place] Living arrangements for the past 2 months: [place] Determinants of Health (SDOH) Interventions    Readmission Risk Interventions     No data to display         Emeterio Reeve, [place] Social Worker

## 2021-11-26 DIAGNOSIS — K922 Gastrointestinal hemorrhage, unspecified: Secondary | ICD-10-CM | POA: Diagnosis not present

## 2021-11-26 DIAGNOSIS — I864 Gastric varices: Secondary | ICD-10-CM | POA: Diagnosis not present

## 2021-11-26 LAB — COMPREHENSIVE METABOLIC PANEL
ALT: 27 U/L (ref 0–44)
AST: 30 U/L (ref 15–41)
Albumin: 3 g/dL — ABNORMAL LOW (ref 3.5–5.0)
Alkaline Phosphatase: 83 U/L (ref 38–126)
Anion gap: 7 (ref 5–15)
BUN: 7 mg/dL — ABNORMAL LOW (ref 8–23)
CO2: 23 mmol/L (ref 22–32)
Calcium: 8.4 mg/dL — ABNORMAL LOW (ref 8.9–10.3)
Chloride: 107 mmol/L (ref 98–111)
Creatinine, Ser: 1.07 mg/dL (ref 0.61–1.24)
GFR, Estimated: >60 mL/min (ref >60)
Glucose, Bld: 99 mg/dL (ref 70–99)
Potassium: 3.8 mmol/L (ref 3.5–5.1)
Sodium: 137 mmol/L (ref 135–145)
Total Bilirubin: 0.5 mg/dL (ref 0.3–1.2)
Total Protein: 7.2 g/dL (ref 6.5–8.1)

## 2021-11-26 LAB — CBC
HCT: 32.1 % — ABNORMAL LOW (ref 39.0–52.0)
Hemoglobin: 9.7 g/dL — ABNORMAL LOW (ref 13.0–17.0)
MCH: 27.2 pg (ref 26.0–34.0)
MCHC: 30.2 g/dL (ref 30.0–36.0)
MCV: 90.2 fL (ref 80.0–100.0)
Platelets: 149 10*3/uL — ABNORMAL LOW (ref 150–400)
RBC: 3.56 MIL/uL — ABNORMAL LOW (ref 4.22–5.81)
RDW: 19.5 % — ABNORMAL HIGH (ref 11.5–15.5)
WBC: 9 10*3/uL (ref 4.0–10.5)
nRBC: 0 % (ref 0.0–0.2)

## 2021-11-26 LAB — CANCER ANTIGEN 19-9: CA 19-9: 22 U/mL (ref 0–35)

## 2021-11-26 LAB — MAGNESIUM: Magnesium: 2.1 mg/dL (ref 1.7–2.4)

## 2021-11-26 MED ORDER — PANTOPRAZOLE SODIUM 40 MG PO TBEC
40.0000 mg | DELAYED_RELEASE_TABLET | Freq: Every day | ORAL | Status: DC
  Administered 2021-11-26 – 2021-11-29 (×4): 40 mg via ORAL
  Filled 2021-11-26 (×4): qty 1

## 2021-11-26 NOTE — Evaluation (Signed)
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-has seen Dr Delaine Lame   Prostate cancer [facility]) 02/2016   Prostate enlargement    Urinary catheter insertion/adjustment/removal    Urinary retention    UTI (lower urinary tract infection)    UTI symptoms    Weight loss    Past Surgical History:  Past Surgical History:  Procedure Laterality Date   ESOPHAGOGASTRODUODENOSCOPY N/A 11/21/2021   Procedure: ESOPHAGOGASTRODUODENOSCOPY (EGD);  Surgeon: Annamaria Helling, DO;  Location: [facility] ENDOSCOPY;  Service: Gastroenterology;  Laterality: N/A;   IR ANGIOGRAM SELECTIVE EACH ADDITIONAL VESSEL  11/25/2021   IR EMBO ART  VEN HEMORR LYMPH EXTRAV  INC GUIDE ROADMAPPING  11/25/2021   IR TRANSHEPATIC PORTOGRAM WO HEMO  11/25/2021   IR US GUIDE VASC ACCESS RIGHT  11/25/2021   TRANSURETHRAL RESECTION OF BLADDER TUMOR N/A 08/17/2015   Procedure: TRANSURETHRAL RESECTION OF BLADDER TUMOR (TURBT);  Surgeon: [name], MD;  Location: [facility] ORS;  Service: Urology;  Laterality: N/A;   TRANSURETHRAL RESECTION OF PROSTATE N/A 08/17/2015   Procedure: TRANSURETHRAL RESECTION OF THE PROSTATE (TURP);  Surgeon: [name], MD;  Location: [facility] ORS;  Service: Urology;  Laterality: N/A;   XI ROBOTIC ASSISTED VENTRAL HERNIA N/A 09/30/2020   Procedure: XI ROBOTIC ASSISTED VENTRAL HERNIA;  Surgeon: Herbert Pun, MD;  Location: [facility] ORS;  Service: General;  Laterality: N/A;   HPI:  71 y.o. male presented to [facility] on 11/20/2021 after he was  found down at home with alteration in his mental status. Dx GI bleed with anemia. EGD 6/13 with isolated gastric varix.  Medical history significant for dementia, untreated hepatitis C, cirrhosis, and hypertension.    Assessment / Plan / Recommendation  Clinical Impression  Pt was pleasant, smiling and participatory, but unable to follow commands.  Soft mitt removed from right hand and he was able to feed himself crackers and drink water from a straw.  There was adequate oral attention, thorough mastication; no s/s of aspiration.  Assisted with feeding applesauce - there were no difficulties.  Despite cognitive deficits, his swallowing is quite functional. Continue current diet. No dysphagia identified. SLP will sign off. SLP Visit Diagnosis: Dysphagia, unspecified (R13.10)    Aspiration Risk  No limitations    Diet Recommendation   Advance to regular diet from soft when appropriate from GI perspective; thin liquids  Medication Administration: Whole meds with liquid    Other  Recommendations Oral Care Recommendations: Oral care BID    Recommendations for follow up therapy are one component of a multi-disciplinary discharge planning process, led by the attending physician.  Recommendations may be updated based on patient status, additional functional criteria and insurance authorization.  Follow up Recommendations No SLP follow up      Assistance Recommended at Discharge    Functional Status Assessment    Frequency and Duration            Prognosis        Swallow Study   General Date  of Onset: 11/21/21 HPI: 71 y.o. male presented to [facility] on 11/20/2021 after he was found down at home with alteration in his mental status. Dx GI bleed with anemia. EGD 6/13 with isolated gastric varix.  Medical history significant for dementia, untreated hepatitis C, cirrhosis, and hypertension. Type of Study: Bedside Swallow Evaluation Previous Swallow Assessment: no Diet Prior to this Study:  Dysphagia 3 (soft);Thin liquids Temperature Spikes Noted: No Respiratory Status: Room air History of Recent Intubation: No Behavior/Cognition: Alert;Confused Oral Cavity Assessment: Within Functional Limits Oral Care Completed by SLP: No Oral Cavity - Dentition: Missing dentition Vision: Functional for self-feeding Self-Feeding Abilities: Needs assist;Able to feed self Patient Positioning: Upright in bed Baseline Vocal Quality: Normal Volitional Cough: Cognitively unable to elicit Volitional Swallow: Unable to elicit    Oral/Motor/Sensory Function Overall Oral Motor/Sensory Function: Within functional limits   Ice Chips Ice chips: Within functional limits   Thin Liquid Thin Liquid: Within functional limits    Nectar Thick Nectar Thick Liquid: Not tested   Honey Thick Honey Thick Liquid: Not tested   Puree Puree: Within functional limits   Solid     Solid: Within functional limits      Daniel Knapp 11/26/2021,9:35 AM Daniel Knapp L. Tivis Ringer, MA CCC/SLP Clinical Specialist - Between Office number [phone]

## 2021-11-26 NOTE — Progress Notes (Addendum)
PROGRESS NOTE                                                                                                                                                                                                             Patient Demographics:    Daniel Knapp, is a 71 y.o. male, DOB - 08-12-1950, KKX:[id number]  Outpatient Primary MD for the patient is Derinda Late, MD    LOS - 3  Admit date - 11/23/2021    GI Bleed, from [facility]      Brief Narrative (HPI from H&P)  70 y.o. male with medical history significant for dementia, untreated hepatitis C, cirrhosis, and hypertension who presented to [facility] on 11/20/2021 with after he was found down at home with alteration in his mental status.  Patient had been less responsive for the preceding 3 days, had some bright red blood per rectum noted by his caretaker who attributed this to hemorrhoids, and had not seen any vomiting or hematemesis.     Subjective:   Patient in bed, appears comfortable, denies any headache, no fever, no chest pain or pressure, no shortness of breath , no abdominal pain. No new focal weakness.   Assessment  & Plan :   1. 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  H&H currently stable.  He underwent Hepatic parenchymal track coil embolization by IR on 11/25/2021 was unsuccessful, H&H remained stable, will switch to oral PPI and discontinue IV octreotide. Abdominal ultrasound rules out ascites, will stop Rocephin after total of 5 doses including the doses at [facility], no signs of ongoing GI bleed if stable discharge on 11/27/2021.   2. Cirrhosis   - Suspected secondary to untreated hepatitis C , MELD score was 12 , appears compensated abdominal  ultrasound does not confirm any ascites.  Outpatient follow-up with GI postdischarge.   3. Hypertension  - Treat as-needed only for now    4.  History of of underlying Alzheimer's dementia   - Delirium precautions, minimize narcotics and benzodiazepine use.   5. IDA  - Received iron infusion at [facility]   6. ?  Pancreatic change on CT - follow with PCP and Primary GI MD in 1-2 weeks. Updtaed sister [name] in detail.      Condition - Extremely Guarded  Family Communication  :   Agnes Jyren [phone] on 11/24/2021, she states that she would prefer her brother to come home  Code Status :  Full  Consults  :  GI, IR  PUD Prophylaxis : PPI   Procedures  :     IR - 11/25/21  Procedure:  1) Percutaneous, transhepatic portal venogram 2) Catheterization and venography of posterior gastric vein 3) Hepatic parenchymal track coil embolization - unsuccessful   CT -   VASCULAR 1. No evidence of active bleeding. 2. No evidence of aneurysm, dissection or other acute vascular abnormality.   NON-VASCULAR 1. Abnormal appearance of the pancreatic tail which is now inseparable from the adjacent and abutting splenic parenchyma. There is inflammatory stranding in the region as well as slight interval enlargement of a dystrophic calcification compared to prior imaging from 2017. Further, there is new wedge-shaped infarct in the same region. Differential considerations include focal pancreatitis of the pancreatic tail with involvement of the spleen including a small splenic infarct versus pancreatic tail neoplasm with direct splenic invasion. Small volume reactive fluid in the peripancreatic soft tissues extending along the left pericolic gutter and low within the anatomic pelvis. 2. Abnormal retroperitoneal lymphadenopathy predominantly in the right para aortic space. Differential considerations include lymphoma versus metastatic prostate or bladder cancer. 3. Similar appearance of nodular extension of the  prostate gland into the right bladder trigone compared to prior imaging from 02/21/2016. If anything, findings are slightly less conspicuous than previously seen. 4. Colonic diverticular disease without CT evidence of active inflammation. 5. Bilateral nonobstructing nephrolithiasis. 6. Additional ancillary findings as above.      Disposition Plan  :    Status is: Inpatient  DVT Prophylaxis  :    SCDs Start: 11/23/21 2309    Lab Results  Component Value Date   PLT 149 (L) 11/26/2021    Diet :  Diet Order             Diet NPO time specified Except for: Sips with Meds  Diet effective midnight                    Inpatient Medications  Scheduled Meds:  pantoprazole (PROTONIX) IV  40 mg Intravenous Q12H   sodium chloride flush  3 mL Intravenous Q12H   Continuous Infusions:  cefTRIAXone (ROCEPHIN)  IV 2 g (11/25/21 1027)   octreotide (SANDOSTATIN) 500 mcg in sodium chloride 0.9 % 250 mL (2 mcg/mL) infusion 50 mcg/hr (11/26/21 0630)   PRN Meds:.  Antibiotics  :    Anti-infectives (From admission, onward)    Start     Dose/Rate Route Frequency Ordered Stop   11/24/21 1000  cefTRIAXone (ROCEPHIN) 2 g in sodium chloride 0.9 % 100 mL IVPB        2 g 200 mL/hr over 30 Minutes Intravenous Every 24 hours 11/23/21 2314          Time Spent in minutes  30   Lala Lund M.D on 11/26/2021 at 8:43 AM  To page go to [email or web address]   [facility] -  Office  [phone]  See all Orders from today for further details    Objective:   Vitals:   11/25/21 2010 11/25/21 2316 11/26/21 0348 11/26/21 0742  BP: 111/77 124/74 121/71 126/71  Pulse: 63 66 67 65  Resp: '13 18 16 14  '$ Temp: 97.8 F (36.6 C) 97.8 F (36.6 C) 97.8 F (36.6 C) 98.9 F (37.2 C)  TempSrc: Oral Oral Oral Axillary  SpO2: 100% 97% 98% 98%  Weight:      Height:        Wt Readings from Last 3 Encounters:  11/23/21 88.9 kg  11/20/21 90 kg  09/30/20 90.7 kg     Intake/Output Summary  (Last 24 hours) at 11/26/2021 0843 Last data filed at 11/26/2021 0348 Gross per 24 hour  Intake 1364.57 ml  Output 1550 ml  Net -185.43 ml     Physical Exam  Awake pleasantly confused, No new F.N deficits, Normal affect [place].AT,PERRAL Supple Neck, No JVD,   Symmetrical Chest wall movement, Good air movement bilaterally, CTAB RRR,No Gallops, Rubs or new Murmurs,  +ve B.Sounds, Abd Soft, No tenderness,   No Cyanosis, Clubbing or edema       Data Review:    CBC Recent Labs  Lab 11/24/21 0731 11/24/21 1524 11/24/21 2335 11/25/21 0430 11/26/21 0619  WBC 4.7 6.2 6.6 6.6 9.0  HGB 7.7* 9.0* 8.6* 9.7* 9.7*  HCT 25.4* 28.9* 28.4* 31.0* 32.1*  PLT 102* 145* 134* 154 149*  MCV 88.2 87.0 87.9 87.1 90.2  MCH 26.7 27.1 26.6 27.2 27.2  MCHC 30.3 31.1 30.3 31.3 30.2  RDW 18.4* 18.6* 18.8* 18.6* 19.5*    Electrolytes Recent Labs  Lab 11/20/21 0922 11/20/21 1056 11/21/21 0502 11/22/21 0541 11/23/21 0144 11/25/21 0430 11/26/21 0251  NA 145  --  144 141 144 138 137  K 3.2*  --  3.5 3.3* 3.5 3.3* 3.8  CL 116*  --  110 108 111 100 107  CO2 22  --  '28 28 29 27 23  '$ GLUCOSE 113*  --  113* 110* 107* 116* 99  BUN 25*  --  '16 18 21 '$ 5* 7*  CREATININE 0.94  --  0.93 1.14 1.15 1.07 1.07  CALCIUM 8.3*  --  8.7* 8.6* 8.5* 8.8* 8.4*  AST 22  --  28  --   --  32 30  ALT 13  --  15  --   --  28 27  ALKPHOS 43  --  54  --   --  85 83  BILITOT 0.5  --  1.3*  --   --  0.8 0.5  ALBUMIN 3.1*  --  3.3*  --   --  3.4* 3.0*  MG 1.9  --   --   --   --  2.2 2.1  INR  --  1.4* 1.3*  --   --  1.2  --   AMMONIA  --  <10  --   --   --   --   --      Micro Results No results found for this or any previous visit (from the past 240 hour(s)).  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 Plan for percutaneous, transhepatic portal venogram and possible variceal obliteration. EXAM: 1. Ultrasound-guided transhepatic portal vein access 2. Portal venogram 3. Selective catheterization and venography of posterior gastric vein 4. Coil embolization of hepatic parenchymal catheter track MEDICATIONS: None. ANESTHESIA/SEDATION: Moderate (conscious) sedation was employed during this procedure. A total of Versed 3 mg and Fentanyl  100 mcg was administered intravenously. Moderate Sedation Time: 103 minutes. The patient's level of consciousness and vital signs were monitored continuously by radiology nursing throughout the procedure under my direct supervision. CONTRAST:  75 mL Omnipaque 300, intravenous FLUOROSCOPY: Radiation Exposure Index (as provided by the fluoroscopic device): 638 mGy Kerma COMPLICATIONS: None immediate. PROCEDURE: Informed written consent was obtained from the patient's sister and power of attorney, [name], after a thorough discussion of the procedural risks, benefits and alternatives. All questions were addressed. Maximal Sterile Barrier Technique was utilized including caps, mask, sterile gowns, sterile gloves, sterile drape, hand hygiene and skin antiseptic. A timeout was performed prior to the initiation of the procedure. The right mid axillary upper abdomen was prepped and draped in standard fashion. Ultrasound evaluation demonstrated patent portal vein in the peripheral aspect of the right lobe of the liver. Subdermal Local anesthesia was provided with 1% lidocaine. Small skin nick was made. Under direct ultrasound visualization, a radical branch of the right portal vein was accessed with a 21 gauge Chiba needle. A wire was advanced to the central, main portal vein. A 6 French Accustick set was then advanced to the right portal vein and the wire was removed. Hand injection of contrast demonstrated appropriate intraluminal position. A Wholey wire was  then inserted over which the 6 [place] sheath was exchanged for a 7 [place], braided vascular sheath. An angled tip, 5 French catheter was then directed to the splenic vein. Catheter was exchanged for a Omni Flush catheter and splenic venogram was performed. The splenic vein is widely patent with brisk antegrade flow. There is no significant reflux into gastric varices. The catheter was exchanged for a C2 catheter which was used to identify an cannulate the posterior gastric vein. The catheter was exchanged for a 7 mm x 2 cm mustang balloon. The balloon was insufflated in the central aspect of the posterior gastric vein for occlusive purposes. Venogram was then performed in multiple obliquities which demonstrated mildly prominent gastric varices about the fundus with multiple draining veins, no evidence of active hemorrhage. There was neck attempt at exchanging the indwelling sheath and balloon occlusion catheter, however tortuosity of the portal vein thwarted these attempts. Therefore, given the overall patient condition, mildly dilated varices, and technical difficulties due to tortuosity, attempts at variceal bleed aeration were aborted. The indwelling catheter was removed. The sheath was retracted to the peripheral aspect the right lobe of the liver. Under direct fluoroscopic visualization, a single 6 mm Nester coil was deployed in the peripheral sheath track and hemostasis was achieved with brief manual compression. Patient tolerated the procedure well was transferred back to the floor in stable condition. 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 The posterior gastric vein is widely patent with multiple draining outflow veins, and there is no evidence of splenic vein thrombosis as initially queried. Given these findings in addition to the patient's overall condition, attempts at variceal obliteration were aborted. Ruthann Cancer, MD [facility] Radiology Electronically Signed   By: Ruthann Cancer M.D.   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 Plan for percutaneous, transhepatic portal venogram and possible variceal obliteration. EXAM: 1. Ultrasound-guided transhepatic portal vein access 2. Portal venogram 3. Selective catheterization and venography of posterior gastric  vein 4. Coil embolization of hepatic parenchymal catheter track MEDICATIONS: None. ANESTHESIA/SEDATION: Moderate (conscious) sedation was employed during this procedure. A total of Versed 3 mg and Fentanyl 100 mcg was administered intravenously. Moderate Sedation Time: 103 minutes. The patient's level of consciousness and vital signs were monitored continuously by radiology nursing throughout the procedure under my direct supervision. CONTRAST:  75 mL Omnipaque 300, intravenous FLUOROSCOPY: Radiation Exposure Index (as provided by the fluoroscopic device): 563 mGy Kerma COMPLICATIONS: None immediate. PROCEDURE: Informed written consent was obtained from the patient's sister and power of attorney, [name], after a thorough discussion of the procedural risks, benefits and alternatives. All questions were addressed. Maximal Sterile Barrier Technique was utilized including caps, mask, sterile gowns, sterile gloves, sterile drape, hand hygiene and skin antiseptic. A timeout was performed prior to the initiation of the procedure. The right mid axillary upper abdomen was prepped and draped in standard fashion. Ultrasound evaluation demonstrated patent portal vein in the peripheral aspect of the right lobe of the liver. Subdermal Local anesthesia was provided with 1% lidocaine. Small skin nick was made. Under direct ultrasound visualization, a  radical branch of the right portal vein was accessed with a 21 gauge Chiba needle. A wire was advanced to the central, main portal vein. A 6 French Accustick set was then advanced to the right portal vein and the wire was removed. Hand injection of contrast demonstrated appropriate intraluminal position. A Wholey wire was then inserted over which the 6 [place] sheath was exchanged for a 7 [place], braided vascular sheath. An angled tip, 5 French catheter was then directed to the splenic vein. Catheter was exchanged for a Omni Flush catheter and splenic venogram was performed. The splenic vein is widely patent with brisk antegrade flow. There is no significant reflux into gastric varices. The catheter was exchanged for a C2 catheter which was used to identify an cannulate the posterior gastric vein. The catheter was exchanged for a 7 mm x 2 cm mustang balloon. The balloon was insufflated in the central aspect of the posterior gastric vein for occlusive purposes. Venogram was then performed in multiple obliquities which demonstrated mildly prominent gastric varices about the fundus with multiple draining veins, no evidence of active hemorrhage. There was neck attempt at exchanging the indwelling sheath and balloon occlusion catheter, however tortuosity of the portal vein thwarted these attempts. Therefore, given the overall patient condition, mildly dilated varices, and technical difficulties due to tortuosity, attempts at variceal bleed aeration were aborted. The indwelling catheter was removed. The sheath was retracted to the peripheral aspect the right lobe of the liver. Under direct fluoroscopic visualization, a single 6 mm Nester coil was deployed in the peripheral sheath track and hemostasis was achieved with brief manual compression. Patient tolerated the procedure well was transferred back to the floor in stable condition. 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  The posterior gastric vein is widely patent with multiple draining outflow veins, and there is no evidence of splenic vein thrombosis as initially queried. Given these findings in addition to the patient's overall condition, attempts at variceal obliteration were aborted. Ruthann Cancer, MD [facility] Radiology Electronically Signed   By: Ruthann Cancer M.D.   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 Plan  for percutaneous, transhepatic portal venogram and possible variceal obliteration. EXAM: 1. Ultrasound-guided transhepatic portal vein access 2. Portal venogram 3. Selective catheterization and venography of posterior gastric vein 4. Coil embolization of hepatic parenchymal catheter track MEDICATIONS: None. ANESTHESIA/SEDATION: Moderate (conscious) sedation was employed during this procedure. A total of Versed 3 mg and Fentanyl 100 mcg was administered intravenously. Moderate Sedation Time: 103 minutes. The patient's level of consciousness and vital signs were monitored continuously by radiology nursing throughout the procedure under my direct supervision. CONTRAST:  75 mL Omnipaque 300, intravenous FLUOROSCOPY: Radiation Exposure Index (as provided by the fluoroscopic device): 229 mGy Kerma COMPLICATIONS: None immediate. PROCEDURE: Informed written consent was obtained from the patient's sister and power of attorney, [name], after a thorough discussion of the procedural risks, benefits and alternatives. All questions were addressed. Maximal Sterile Barrier Technique was utilized including caps, mask, sterile gowns, sterile gloves, sterile drape, hand hygiene and skin antiseptic. A timeout was performed prior to the initiation of the  procedure. The right mid axillary upper abdomen was prepped and draped in standard fashion. Ultrasound evaluation demonstrated patent portal vein in the peripheral aspect of the right lobe of the liver. Subdermal Local anesthesia was provided with 1% lidocaine. Small skin nick was made. Under direct ultrasound visualization, a radical branch of the right portal vein was accessed with a 21 gauge Chiba needle. A wire was advanced to the central, main portal vein. A 6 French Accustick set was then advanced to the right portal vein and the wire was removed. Hand injection of contrast demonstrated appropriate intraluminal position. A Wholey wire was then inserted over which the 6 [place] sheath was exchanged for a 7 [place], braided vascular sheath. An angled tip, 5 French catheter was then directed to the splenic vein. Catheter was exchanged for a Omni Flush catheter and splenic venogram was performed. The splenic vein is widely patent with brisk antegrade flow. There is no significant reflux into gastric varices. The catheter was exchanged for a C2 catheter which was used to identify an cannulate the posterior gastric vein. The catheter was exchanged for a 7 mm x 2 cm mustang balloon. The balloon was insufflated in the central aspect of the posterior gastric vein for occlusive purposes. Venogram was then performed in multiple obliquities which demonstrated mildly prominent gastric varices about the fundus with multiple draining veins, no evidence of active hemorrhage. There was neck attempt at exchanging the indwelling sheath and balloon occlusion catheter, however tortuosity of the portal vein thwarted these attempts. Therefore, given the overall patient condition, mildly dilated varices, and technical difficulties due to tortuosity, attempts at variceal bleed aeration were aborted. The indwelling catheter was removed. The sheath was retracted to the peripheral aspect the right lobe of the liver. Under direct  fluoroscopic visualization, a single 6 mm Nester coil was deployed in the peripheral sheath track and hemostasis was achieved with brief manual compression. Patient tolerated the procedure well was transferred back to the floor in stable condition. 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 The posterior gastric vein is widely patent with multiple draining outflow veins, and there is no evidence of splenic vein thrombosis as initially queried. Given these findings in addition to the patient's overall condition, attempts at variceal obliteration were aborted. Ruthann Cancer, MD [facility] Radiology Electronically Signed   By: Ruthann Cancer M.D.   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 Plan for percutaneous, transhepatic portal venogram and possible variceal obliteration. EXAM: 1. Ultrasound-guided transhepatic portal vein access 2. Portal venogram 3. Selective catheterization and venography of posterior gastric vein 4. Coil embolization of hepatic parenchymal catheter track MEDICATIONS: None. ANESTHESIA/SEDATION: Moderate (conscious) sedation was employed during this procedure. A total of Versed 3 mg and Fentanyl 100 mcg was administered intravenously. Moderate Sedation Time: 103 minutes. The patient's level of consciousness and vital signs were monitored continuously by radiology nursing throughout the procedure under my direct supervision. CONTRAST:  75 mL Omnipaque 300, intravenous FLUOROSCOPY: Radiation Exposure Index (as provided by the fluoroscopic device): 016 mGy Kerma COMPLICATIONS: None immediate. PROCEDURE: Informed written consent was obtained from the patient's sister and  power of attorney, [name], after a thorough discussion of the procedural risks, benefits and alternatives. All questions were addressed. Maximal Sterile Barrier Technique was utilized including caps, mask, sterile gowns, sterile gloves, sterile drape, hand hygiene and skin antiseptic. A timeout was performed prior to the initiation of the procedure. The right mid axillary upper abdomen was prepped and draped in standard fashion. Ultrasound evaluation demonstrated patent portal vein in the peripheral aspect of the right lobe of the liver. Subdermal Local anesthesia was provided with 1% lidocaine. Small skin nick was made. Under direct ultrasound visualization, a radical branch of the right portal vein was accessed with a 21 gauge Chiba needle. A wire was advanced to the central, main portal vein. A 6 French Accustick set was then advanced to the right portal vein and the wire was removed. Hand injection of contrast demonstrated appropriate intraluminal position. A Wholey wire was then inserted over which the 6 [place] sheath was exchanged for a 7 [place], braided vascular sheath. An angled tip, 5 French catheter was then directed to the splenic vein. Catheter was exchanged for a Omni Flush catheter and splenic venogram was performed. The splenic vein is widely patent with brisk antegrade flow. There is no significant reflux into gastric varices. The catheter was exchanged for a C2 catheter which was used to identify an cannulate the posterior gastric vein. The catheter was exchanged for a 7 mm x 2 cm mustang balloon. The balloon was insufflated in the central aspect of the posterior gastric vein for occlusive purposes. Venogram was then performed in multiple obliquities which demonstrated mildly prominent gastric varices about the fundus with multiple draining veins, no evidence of active hemorrhage. There was neck attempt at exchanging the indwelling sheath and balloon occlusion catheter, however tortuosity of  the portal vein thwarted these attempts. Therefore, given the overall patient condition, mildly dilated varices, and technical difficulties due to tortuosity, attempts at variceal bleed aeration were aborted. The indwelling catheter was removed. The sheath was retracted to the peripheral aspect the right lobe of the liver. Under direct fluoroscopic visualization, a single 6 mm Nester coil was deployed in the peripheral sheath track and hemostasis was achieved with brief manual compression. Patient tolerated the procedure well was transferred back to the floor in stable condition. 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 The posterior gastric vein is widely patent with multiple draining outflow veins, and there is no evidence of splenic vein thrombosis as initially queried. Given these findings in addition to the patient's overall condition, attempts at variceal obliteration were aborted. Ruthann Cancer, MD [facility]  [facility] Electronically Signed   By: Ruthann Cancer M.D.   On: 11/25/2021 16:38

## 2021-11-26 NOTE — Progress Notes (Signed)
   Patient Name: Daniel Knapp Date of Encounter: 11/26/2021, 10:29 AM    Subjective  Patient resting in bed.   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 Cirrhosis. Severe dementia  Unfortunately IR despite their valiant efforts was unable to help.  GI does not have any options to treat the gastric varices, either.  Cyanoacrylate glue injection is a known option but we do not have the ability to do that here, now.  Recommend discharge and outpatient follow-up with his [place] gastroenterologist and they can consider other potential treatment options for the varices.  Signing off.  Gatha Mayer, MD, [facility] See Shea Evans on call - gastroenterology for best contact person 11/26/2021 10:29 AM

## 2021-11-27 ENCOUNTER — Other Ambulatory Visit (HOSPITAL_COMMUNITY): Payer: Self-pay

## 2021-11-27 DIAGNOSIS — K922 Gastrointestinal hemorrhage, unspecified: Secondary | ICD-10-CM | POA: Diagnosis not present

## 2021-11-27 LAB — CBC WITH DIFFERENTIAL/PLATELET
Abs Immature Granulocytes: 0.02 10*3/uL (ref 0.00–0.07)
Basophils Absolute: 0 10*3/uL (ref 0.0–0.1)
Basophils Relative: 1 %
Eosinophils Absolute: 0.1 10*3/uL (ref 0.0–0.5)
Eosinophils Relative: 2 %
HCT: 29.7 % — ABNORMAL LOW (ref 39.0–52.0)
Hemoglobin: 9.3 g/dL — ABNORMAL LOW (ref 13.0–17.0)
Immature Granulocytes: 0 %
Lymphocytes Relative: 16 %
Lymphs Abs: 1 10*3/uL (ref 0.7–4.0)
MCH: 27.4 pg (ref 26.0–34.0)
MCHC: 31.3 g/dL (ref 30.0–36.0)
MCV: 87.6 fL (ref 80.0–100.0)
Monocytes Absolute: 0.5 10*3/uL (ref 0.1–1.0)
Monocytes Relative: 8 %
Neutro Abs: 4.6 10*3/uL (ref 1.7–7.7)
Neutrophils Relative %: 73 %
Platelets: 136 10*3/uL — ABNORMAL LOW (ref 150–400)
RBC: 3.39 MIL/uL — ABNORMAL LOW (ref 4.22–5.81)
RDW: 19.3 % — ABNORMAL HIGH (ref 11.5–15.5)
WBC: 6.2 10*3/uL (ref 4.0–10.5)
nRBC: 0 % (ref 0.0–0.2)

## 2021-11-27 MED ORDER — AMLODIPINE BESYLATE 10 MG PO TABS
10.0000 mg | ORAL_TABLET | Freq: Every day | ORAL | Status: DC
  Administered 2021-11-27 – 2021-11-28 (×2): 10 mg via ORAL
  Filled 2021-11-27 (×2): qty 1

## 2021-11-27 MED ORDER — TAMSULOSIN HCL 0.4 MG PO CAPS
0.4000 mg | ORAL_CAPSULE | Freq: Every day | ORAL | Status: DC
  Administered 2021-11-27 – 2021-11-28 (×2): 0.4 mg via ORAL
  Filled 2021-11-27 (×2): qty 1

## 2021-11-27 MED ORDER — VITAMIN B-12 1000 MCG PO TABS
1000.0000 ug | ORAL_TABLET | Freq: Every day | ORAL | Status: DC
  Administered 2021-11-27 – 2021-11-28 (×2): 1000 ug via ORAL
  Filled 2021-11-27 (×2): qty 1

## 2021-11-27 MED ORDER — DIVALPROEX SODIUM ER 250 MG PO TB24
250.0000 mg | ORAL_TABLET | Freq: Every day | ORAL | Status: DC
  Administered 2021-11-27 – 2021-11-28 (×2): 250 mg via ORAL
  Filled 2021-11-27 (×3): qty 1

## 2021-11-27 MED ORDER — FINASTERIDE 5 MG PO TABS
5.0000 mg | ORAL_TABLET | Freq: Every day | ORAL | Status: DC
  Administered 2021-11-27 – 2021-11-28 (×2): 5 mg via ORAL
  Filled 2021-11-27 (×2): qty 1

## 2021-11-27 MED ORDER — DONEPEZIL HCL 10 MG PO TABS
10.0000 mg | ORAL_TABLET | Freq: Every day | ORAL | Status: DC
  Administered 2021-11-27 – 2021-11-28 (×2): 10 mg via ORAL
  Filled 2021-11-27 (×2): qty 1

## 2021-11-27 MED ORDER — PANTOPRAZOLE SODIUM 40 MG PO TBEC
40.0000 mg | DELAYED_RELEASE_TABLET | Freq: Two times a day (BID) | ORAL | 1 refills | Status: DC
  Filled 2021-11-27: qty 60, 30d supply, fill #0

## 2021-11-27 MED ORDER — MEMANTINE HCL 10 MG PO TABS
5.0000 mg | ORAL_TABLET | Freq: Two times a day (BID) | ORAL | Status: DC
  Administered 2021-11-27 – 2021-11-29 (×5): 5 mg via ORAL
  Filled 2021-11-27 (×5): qty 1

## 2021-11-27 NOTE — Discharge Summary (Addendum)
Daniel Knapp MLJ:[id number] DOB: 1950/12/30 DOA: 11/23/2021  PCP: Daniel Late, MD  Admit date: 11/23/2021  Discharge date: 11/27/2021  Admitted From: Home   Disposition: Home   Recommendations for Outpatient Follow-up:   Follow up with PCP in 1-2 weeks  PCP Please obtain BMP/CBC, 2 view CXR in 1week,  (see Discharge instructions)   PCP Please follow up on the following pending results: Needs close outpatient GI follow-up.   Home Health: PT, RN, speech, aide, social work if he qualifies Equipment/Devices: None  Consultations: GI, IR Discharge Condition: Fair   CODE STATUS: Full    Diet Recommendation: Dysphagia 3 diet   CC - blood in stool, transferred from [facility]    Brief history of present illness from the day of admission and additional interim summary    71 y.o. male with medical history significant for dementia, untreated hepatitis C, cirrhosis, and hypertension who presented to [facility] on 11/20/2021 with after he was found down at home with alteration in his mental status.  Patient had been less responsive for the preceding 3 days, had some bright red blood per rectum noted by his caretaker who attributed this to hemorrhoids, and had not seen any vomiting or hematemesis.                                                                   Hospital Course    1. 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  H&H currently stable.  He underwent noted hepatic parenchymal track coil embolization by IR on 11/25/2021 but this procedure was unsuccessful, H&H remained stable, now stable on oral  PPI. Abdominal ultrasound rules out ascites, Rocephin was stopped after total of 5 to, no signs of ongoing GI bleed, case discussed with GI physician Daniel Knapp in detail on 11/26/2021, discharged with outpatient primary gastroenterologist follow-up.   2. Cirrhosis   - Suspected secondary to untreated hepatitis C , MELD score was 12 , appears compensated abdominal ultrasound does not confirm any ascites.  Outpatient follow-up with GI postdischarge.  Case discussed with gastroenterologist Daniel Knapp in detail on 11/26/2021.   3. Hypertension  -labile Home medications continued.   4.  History of of underlying Alzheimer's dementia   - Delirium precautions, minimize narcotics and benzodiazepine use.  Continue home regimen.   5. IDA  - Received iron infusion at [facility] PCP tomorrow CBC and anemia panel.  6. ? Pancreatic change on CT - follow with PCP and Primary GI MD in 1-2 weeks. Updtaed [name] in detail.   Note I  had discussed with patient's Daniel Knapp on 11/24/2021 and she had reiterated that she wants patient to come home and that he has 2 sisters who take care of him at home and he gets good care.  I talked to her again on 11/27/2021 at 9:30 AM and she told me that she was going for a mammogram around 11 AM and after which she will come to [facility] and pick up Daniel Knapp for home, she also told me that she did not think he required an ambulance  Post discharge she has changed her mind and said that she was confused she never agreed for him to come home and that she now wants him to go to [employer] where case management has recommended that he should go.   Discharge diagnosis     Principal Problem:   Upper GI bleed Active Problems:   Symptomatic anemia   HTN (hypertension)   Dementia (HCC)   Gastric varix   Cirrhosis (HCC)   Abnormal CT scan, gastrointestinal tract    Discharge instructions    Discharge Instructions     Discharge instructions   Complete by: As directed     Follow with Primary MD Daniel Late, MD in 7 days, also follow-up with your gastroenterologist within 5 to 7 days.  Get CBC, CMP, 2 view Chest X ray -  checked next visit within 1 week by Primary MD or SNF MD    Activity: As tolerated with Full fall precautions use walker/cane & assistance as needed  Disposition Home   Diet: Dysphagia 3 diet with feeding assistance and aspiration precautions.  Special Instructions: If you have smoked or chewed Tobacco  in the last 2 yrs please stop smoking, stop any regular Alcohol  and or any Recreational drug use.  On your next visit with your primary care physician please Get Medicines reviewed and adjusted.  Please request your Prim.MD to go over all Hospital Tests and Procedure/Radiological results at the follow up, please get all Hospital records sent to your Prim MD by signing hospital release before you go home.  If you experience worsening of your admission symptoms, develop shortness of breath, life threatening emergency, suicidal or homicidal thoughts you must seek medical attention immediately by calling 911 or calling your MD immediately  if symptoms less severe.  You Must read complete instructions/literature along with all the possible adverse reactions/side effects for all the Medicines you take and that have been prescribed to you. Take any new Medicines after you have completely understood and accpet all the possible adverse reactions/side effects.   Increase activity slowly   Complete by: As directed        Discharge Medications   Allergies as of 11/27/2021   No Known Allergies      Medication List     TAKE these medications    amLODipine 10 MG tablet Commonly known as: NORVASC Take 10 mg by mouth daily in the afternoon.   atenolol 25 MG tablet Commonly known as: TENORMIN Take 25 mg by mouth daily.   cholecalciferol 25 MCG (1000 UNIT) tablet Commonly known as: VITAMIN D Take 1,000 Units by mouth daily.    divalproex 250 MG 24 hr tablet Commonly known as: DEPAKOTE ER Take 250 mg by mouth daily in the afternoon.   donepezil 10 MG tablet Commonly known as: ARICEPT Take 10 mg by mouth daily in the afternoon.   finasteride 5 MG tablet Commonly known as: PROSCAR Take 5 mg by mouth daily  in the afternoon. Reported on 08/17/2015   memantine 5 MG tablet Commonly known as: NAMENDA Take 5 mg by mouth 2 (two) times daily.   multivitamin with minerals Tabs tablet Take 1 tablet by mouth daily in the afternoon.   pantoprazole 40 MG tablet Commonly known as: Protonix Take 1 tablet (40 mg total) by mouth 2 (two) times daily before a meal.   tamsulosin 0.4 MG Caps capsule Commonly known as: FLOMAX Take 0.4 mg by mouth daily in the afternoon. Reported on 09/01/2015   vitamin B-12 1000 MCG tablet Commonly known as: CYANOCOBALAMIN Take 1,000 mcg by mouth daily in the afternoon.         Follow-up Information     Daniel Late, MD. Schedule an appointment as soon as possible for a visit in 1 week(s).   Specialty: Family Medicine Why: And your gastroenterologist within 5 to 7 days. Contact information: 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 Plan for percutaneous, transhepatic portal venogram and possible variceal obliteration. EXAM: 1. Ultrasound-guided transhepatic portal vein access 2. Portal venogram 3. Selective catheterization and venography of posterior gastric vein 4. Coil embolization of hepatic parenchymal catheter track  MEDICATIONS: None. ANESTHESIA/SEDATION: Moderate (conscious) sedation was employed during this procedure. A total of Versed 3 mg and Fentanyl 100 mcg was administered intravenously. Moderate Sedation Time: 103 minutes. The patient's level of consciousness and vital signs were monitored continuously by radiology nursing throughout the procedure under my direct supervision. CONTRAST:  75 mL Omnipaque 300, intravenous FLUOROSCOPY: Radiation Exposure Index (as provided by the fluoroscopic device): 601 mGy Kerma COMPLICATIONS: None immediate. PROCEDURE: Informed written consent was obtained from the patient's sister and power of attorney, [name], after a thorough discussion of the procedural risks, benefits and alternatives. All questions were addressed. Maximal Sterile Barrier Technique was utilized including caps, mask, sterile gowns, sterile gloves, sterile drape, hand hygiene and skin antiseptic. A timeout was performed prior to the initiation of the procedure. The right mid axillary upper abdomen was prepped and draped in standard fashion. Ultrasound evaluation demonstrated patent portal vein in the peripheral aspect of the right lobe of the liver. Subdermal Local anesthesia was provided with 1% lidocaine. Small skin nick was made. Under direct ultrasound visualization, a radical branch of the right portal vein was accessed with a 21 gauge Chiba needle. A wire was advanced to the central, main portal vein. A 6 French Accustick set was then advanced to the right portal vein and the wire was removed. Hand injection of contrast demonstrated appropriate intraluminal position. A Wholey wire was then inserted over which the 6 [place] sheath was exchanged for a 7 [place], braided vascular sheath. An angled tip, 5 French catheter was then directed to the splenic vein. Catheter was exchanged for a Omni Flush catheter and splenic venogram was performed. The splenic vein is widely patent with brisk antegrade flow. There is  no significant reflux into gastric varices. The catheter was exchanged for a C2 catheter which was  used to identify an cannulate the posterior gastric vein. The catheter was exchanged for a 7 mm x 2 cm mustang balloon. The balloon was insufflated in the central aspect of the posterior gastric vein for occlusive purposes. Venogram was then performed in multiple obliquities which demonstrated mildly prominent gastric varices about the fundus with multiple draining veins, no evidence of active hemorrhage. There was neck attempt at exchanging the indwelling sheath and balloon occlusion catheter, however tortuosity of the portal vein thwarted these attempts. Therefore, given the overall patient condition, mildly dilated varices, and technical difficulties due to tortuosity, attempts at variceal bleed aeration were aborted. The indwelling catheter was removed. The sheath was retracted to the peripheral aspect the right lobe of the liver. Under direct fluoroscopic visualization, a single 6 mm Nester coil was deployed in the peripheral sheath track and hemostasis was achieved with brief manual compression. Patient tolerated the procedure well was transferred back to the floor in stable condition. 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 The posterior gastric vein is widely patent with multiple draining outflow veins, and there is no evidence of splenic vein thrombosis as initially queried. Given these findings in addition to the patient's overall condition, attempts at variceal obliteration were aborted. Ruthann Cancer, MD [facility] Radiology Electronically Signed   By: Ruthann Cancer M.D.   On: 11/25/2021 16:38   IR US Guide Vasc Access Right  Result Date: 11/25/2021 CLINICAL DATA:  71 year old male with history of gastric hemorrhage and severe anemia with red whale sign on endoscopy with CT findings suggestive of  sinistral portal hypertension. Plan for percutaneous, transhepatic portal venogram and possible variceal obliteration. EXAM: 1. Ultrasound-guided transhepatic portal vein access 2. Portal venogram 3. Selective catheterization and venography of posterior gastric vein 4. Coil embolization of hepatic parenchymal catheter track MEDICATIONS: None. ANESTHESIA/SEDATION: Moderate (conscious) sedation was employed during this procedure. A total of Versed 3 mg and Fentanyl 100 mcg was administered intravenously. Moderate Sedation Time: 103 minutes. The patient's level of consciousness and vital signs were monitored continuously by radiology nursing throughout the procedure under my direct supervision. CONTRAST:  75 mL Omnipaque 300, intravenous FLUOROSCOPY: Radiation Exposure Index (as provided by the fluoroscopic device): 295 mGy Kerma COMPLICATIONS: None immediate. PROCEDURE: Informed written consent was obtained from the patient's sister and power of attorney, [name], after a thorough discussion of the procedural risks, benefits and alternatives. All questions were addressed. Maximal Sterile Barrier Technique was utilized including caps, mask, sterile gowns, sterile gloves, sterile drape, hand hygiene and skin antiseptic. A timeout was performed prior to the initiation of the procedure. The right mid axillary upper abdomen was prepped and draped in standard fashion. Ultrasound evaluation demonstrated patent portal vein in the peripheral aspect of the right lobe of the liver. Subdermal Local anesthesia was provided with 1% lidocaine. Small skin nick was made. Under direct ultrasound visualization, a radical branch of the right portal vein was accessed with a 21 gauge Chiba needle. A wire was advanced to the central, main portal vein. A 6 French Accustick set was then advanced to the right portal vein and the wire was removed. Hand injection of contrast demonstrated appropriate intraluminal position. A Wholey wire was  then inserted over which the 6 [place] sheath was exchanged for a 7 [place], braided vascular sheath. An angled tip, 5 French catheter was then directed to the splenic vein. Catheter was exchanged for a Omni Flush catheter and splenic venogram was performed.  The splenic vein is widely patent with brisk antegrade flow. There is no significant reflux into gastric varices. The catheter was exchanged for a C2 catheter which was used to identify an cannulate the posterior gastric vein. The catheter was exchanged for a 7 mm x 2 cm mustang balloon. The balloon was insufflated in the central aspect of the posterior gastric vein for occlusive purposes. Venogram was then performed in multiple obliquities which demonstrated mildly prominent gastric varices about the fundus with multiple draining veins, no evidence of active hemorrhage. There was neck attempt at exchanging the indwelling sheath and balloon occlusion catheter, however tortuosity of the portal vein thwarted these attempts. Therefore, given the overall patient condition, mildly dilated varices, and technical difficulties due to tortuosity, attempts at variceal bleed aeration were aborted. The indwelling catheter was removed. The sheath was retracted to the peripheral aspect the right lobe of the liver. Under direct fluoroscopic visualization, a single 6 mm Nester coil was deployed in the peripheral sheath track and hemostasis was achieved with brief manual compression. Patient tolerated the procedure well was transferred back to the floor in stable condition. 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 The posterior gastric vein is widely patent with multiple draining outflow veins, and there is no evidence of splenic vein thrombosis as initially queried. Given these findings in addition to the patient's overall condition, attempts at variceal obliteration were aborted. Ruthann Cancer, MD [facility] Inter[facility]  Radiology Special[facility] Radiology Electronically Signed   By: Ruthann Cancer M.D.   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 Plan for percutaneous, transhepatic portal venogram and possible variceal obliteration. EXAM: 1. Ultrasound-guided transhepatic portal vein access 2. Portal venogram 3. Selective catheterization and venography of posterior gastric vein 4. Coil embolization of hepatic parenchymal catheter track MEDICATIONS: None. ANESTHESIA/SEDATION: Moderate (conscious) sedation was employed during this procedure. A total of Versed 3 mg and Fentanyl 100 mcg was administered intravenously. Moderate Sedation Time: 103 minutes. The patient's level of consciousness and vital signs were monitored continuously by radiology nursing throughout the procedure under my direct supervision. CONTRAST:  75 mL Omnipaque 300, intravenous FLUOROSCOPY: Radiation Exposure Index (as provided by the fluoroscopic device): 259 mGy Kerma COMPLICATIONS: None immediate. PROCEDURE: Informed written consent was obtained from the patient's sister and power of attorney, [name], after a thorough discussion of the procedural risks, benefits and alternatives. All questions were addressed. Maximal Sterile Barrier Technique was utilized including caps, mask, sterile gowns, sterile gloves, sterile drape, hand hygiene and skin antiseptic. A timeout was performed prior to the initiation of the procedure. The right mid axillary upper abdomen was prepped and draped in standard fashion. Ultrasound evaluation demonstrated patent portal vein in the peripheral aspect of the right lobe of the liver. Subdermal Local anesthesia was provided with 1% lidocaine. Small skin nick was made. Under direct ultrasound visualization,  a radical branch of the right portal vein was accessed with a 21 gauge Chiba needle. A wire was advanced to the central, main portal vein. A 6 French Accustick set was then advanced to the right portal vein and the wire was removed. Hand injection of contrast demonstrated appropriate intraluminal position. A Wholey wire was then inserted over which the 6 [place] sheath was exchanged for a 7 [place], braided vascular  sheath. An angled tip, 5 French catheter was then directed to the splenic vein. Catheter was exchanged for a Omni Flush catheter and splenic venogram was performed. The splenic vein is widely patent with brisk antegrade flow. There is no significant reflux into gastric varices. The catheter was exchanged for a C2 catheter which was used to identify an cannulate the posterior gastric vein. The catheter was exchanged for a 7 mm x 2 cm mustang balloon. The balloon was insufflated in the central aspect of the posterior gastric vein for occlusive purposes. Venogram was then performed in multiple obliquities which demonstrated mildly prominent gastric varices about the fundus with multiple draining veins, no evidence of active hemorrhage. There was neck attempt at exchanging the indwelling sheath and balloon occlusion catheter, however tortuosity of the portal vein thwarted these attempts. Therefore, given the overall patient condition, mildly dilated varices, and technical difficulties due to tortuosity, attempts at variceal bleed aeration were aborted. The indwelling catheter was removed. The sheath was retracted to the peripheral aspect the right lobe of the liver. Under direct fluoroscopic visualization, a single 6 mm Nester coil was deployed in the peripheral sheath track and hemostasis was achieved with brief manual compression. Patient tolerated the procedure well was transferred back to the floor in stable condition. IMPRESSION: Mildly dilated fundal gastric varices arising from the posterior gastric  vein. The posterior gastric vein is widely patent with multiple draining outflow veins, and there is no evidence of splenic vein thrombosis as initially queried. Given these findings in addition to the patient's overall condition, attempts at variceal obliteration were aborted. Ruthann Cancer, MD [facility] Radiology Electronically Signed   By: Ruthann Cancer M.D.   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 Plan for percutaneous, transhepatic portal venogram and possible variceal obliteration. EXAM: 1. Ultrasound-guided transhepatic portal vein access 2. Portal venogram 3. Selective catheterization and venography of posterior gastric vein 4. Coil embolization of hepatic parenchymal catheter track MEDICATIONS: None. ANESTHESIA/SEDATION: Moderate (conscious) sedation was employed during this procedure. A total of Versed 3 mg and Fentanyl 100 mcg was administered intravenously. Moderate Sedation Time: 103 minutes. The patient's level of consciousness and vital signs were monitored continuously by radiology nursing throughout the procedure under my direct supervision. CONTRAST:  75 mL Omnipaque 300, intravenous FLUOROSCOPY: Radiation Exposure Index (as provided by the fluoroscopic device): 169 mGy Kerma COMPLICATIONS: None immediate. PROCEDURE: Informed written consent was obtained from the patient's sister and power of attorney, [name], after a thorough discussion of the procedural risks, benefits and alternatives. All questions were addressed. Maximal Sterile Barrier Technique was utilized including caps, mask, sterile gowns, sterile gloves, sterile drape, hand hygiene and skin antiseptic. A timeout was performed prior to the  initiation of the procedure. The right mid axillary upper abdomen was prepped and draped in standard fashion. Ultrasound evaluation demonstrated patent portal vein in the peripheral aspect of the right lobe of the liver. Subdermal Local anesthesia was provided with 1% lidocaine. Small skin nick was made. Under direct ultrasound visualization, a radical branch of the right portal vein was accessed with a 21 gauge Chiba needle. A wire was advanced to the central, main portal vein. A 6 French Accustick set was then advanced to the right portal vein and the wire was removed.  Hand injection of contrast demonstrated appropriate intraluminal position. A Wholey wire was then inserted over which the 6 [place] sheath was exchanged for a 7 [place], braided vascular sheath. An angled tip, 5 French catheter was then directed to the splenic vein. Catheter was exchanged for a Omni Flush catheter and splenic venogram was performed. The splenic vein is widely patent with brisk antegrade flow. There is no significant reflux into gastric varices. The catheter was exchanged for a C2 catheter which was used to identify an cannulate the posterior gastric vein. The catheter was exchanged for a 7 mm x 2 cm mustang balloon. The balloon was insufflated in the central aspect of the posterior gastric vein for occlusive purposes. Venogram was then performed in multiple obliquities which demonstrated mildly prominent gastric varices about the fundus with multiple draining veins, no evidence of active hemorrhage. There was neck attempt at exchanging the indwelling sheath and balloon occlusion catheter, however tortuosity of the portal vein thwarted these attempts. Therefore, given the overall patient condition, mildly dilated varices, and technical difficulties due to tortuosity, attempts at variceal bleed aeration were aborted. The indwelling catheter was removed. The sheath was retracted to the peripheral aspect the right lobe of the liver. Under  direct fluoroscopic visualization, a single 6 mm Nester coil was deployed in the peripheral sheath track and hemostasis was achieved with brief manual compression. Patient tolerated the procedure well was transferred back to the floor in stable condition. 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 The posterior gastric vein is widely patent with multiple draining outflow veins, and there is no evidence of splenic vein thrombosis as initially queried. Given these findings in addition to the patient's overall condition, attempts at variceal obliteration were aborted. Ruthann Cancer, MD Vascular and Interventional [facility] Radiology Electronically Signed   By: Ruthann Cancer M.D.   On: 11/25/2021 16:38   CT Angio Abd/Pel w/ and/or w/o  Result Date: 11/21/2021 CLINICAL DATA:  Severe symptomatic anemia, compensated cirrhosis EXAM: CTA ABDOMEN AND PELVIS WITHOUT AND WITH CONTRAST TECHNIQUE: Multidetector CT imaging of the abdomen and pelvis was performed using the standard protocol during bolus administration of intravenous contrast. Multiplanar reconstructed images and MIPs were obtained and reviewed to evaluate the vascular anatomy. RADIATION DOSE REDUCTION: This exam was performed according to the departmental dose-optimization program which includes automated exposure control, adjustment of the mA and/or kV according to patient size and/or use of iterative reconstruction technique. CONTRAST:  182m OMNIPAQUE IOHEXOL 350 MG/ML SOLN COMPARISON:  Prior CT abdomen/pelvis 02/21/2016 FINDINGS: VASCULAR Aorta: Normal caliber aorta without aneurysm, dissection, vasculitis or significant stenosis. Celiac: Patent without evidence of aneurysm, dissection, vasculitis or significant stenosis. Lateral segmental branch of the left hepatic artery is replaced to the left gastric artery. SMA: Patent without evidence of aneurysm, dissection, vasculitis or  significant stenosis. Renals: Both main renal arteries are patent without evidence of aneurysm, dissection, vasculitis, fibromuscular dysplasia or significant stenosis. Accessory left lower pole renal artery. IMA: Patent without evidence of aneurysm, dissection, vasculitis or significant stenosis. Inflow: Patent without evidence of aneurysm, dissection, vasculitis or significant stenosis. Proximal Outflow: Bilateral common femoral and visualized portions of the superficial and profunda femoral arteries are patent without evidence of aneurysm, dissection, vasculitis or significant stenosis. Veins: No focal venous abnormality. Review of the MIP images confirms the above findings. NON-VASCULAR Lower chest: Trace atherosclerotic calcifications along the coronary arteries. Dependent atelectasis in both lower lobes. Additionally, there are is mild focal ground-glass attenuation airspace opacity  in the dependent aspect of the left lower lobe. No pneumothorax. No suspicious nodule. Hepatobiliary: No focal liver abnormality is seen. No gallstones, gallbladder wall thickening, or biliary dilatation. Pancreas: Abnormal appearance of the pancreatic tail which demonstrates inflammatory stranding which is now contiguous with the splenic parenchyma. Additionally, there is focal wedge-shaped hypoattenuation within the spleen extending from the pancreatic tail. Trace fluid identified in the peripancreatic soft tissues and extending into the left pericolic gutter. Spleen: Wedge-shaped hypoattenuation extending from the splenic hilum where it abuts the pancreatic tail to the spleen surface consistent with a small region of infarct. Adrenals/Urinary Tract: Normal adrenal glands. Punctate nonobstructing stone in the interpolar right Knapp measures up to 4 mm. Punctate nonobstructing stone in the lower pole of the left Knapp measures 3 mm. Ureters are unremarkable. Similar appearance of prostatomegaly with nodular extension into the  bladder trigone. Stomach/Bowel: Colonic diverticular disease without CT evidence of active inflammation. No evidence of obstruction or focal bowel wall thickening. Normal appendix in the right lower quadrant. The terminal ileum is unremarkable. Lymphatic: Right para-aortic lymphadenopathy. A cluster of nodes measuring 1.3, 1.71.5 cm in short axis are visible in the right para aortic space (image 120 series 5). Left internal iliac station lymph node is enlarged at approximately 1.4 cm in short axis (image 176 series 5). Reproductive: Similar appearance of prostatomegaly with nodular extension into the right bladder trigone. Other: Repaired fat containing umbilical hernia. Trace ascites in the region of the pancreatic tail extending into the left pericolic gutter. Trace free fluid visible in the pelvis is well. Musculoskeletal: No acute or significant osseous findings. Advanced lumbar degenerative disc disease and facet arthropathy. IMPRESSION: VASCULAR 1. No evidence of active bleeding. 2. No evidence of aneurysm, dissection or other acute vascular abnormality. NON-VASCULAR 1. Abnormal appearance of the pancreatic tail which is now inseparable from the adjacent and abutting splenic parenchyma. There is inflammatory stranding in the region as well as slight interval enlargement of a dystrophic calcification compared to prior imaging from 2017. Further, there is new wedge-shaped infarct in the same region. Differential considerations include focal pancreatitis of the pancreatic tail with involvement of the spleen including a small splenic infarct versus pancreatic tail neoplasm with direct splenic invasion. Small volume reactive fluid in the peripancreatic soft tissues extending along the left pericolic gutter and low within the anatomic pelvis. 2. Abnormal retroperitoneal lymphadenopathy predominantly in the right para aortic space. Differential considerations include lymphoma versus metastatic prostate or bladder  cancer. 3. Similar appearance of nodular extension of the prostate gland into the right bladder trigone compared to prior imaging from 02/21/2016. If anything, findings are slightly less conspicuous than previously seen. 4. Colonic diverticular disease without CT evidence of active inflammation. 5. Bilateral nonobstructing nephrolithiasis. 6. Additional ancillary findings as above. Electronically Signed   By: Jacqulynn Cadet M.D.   On: 11/21/2021 16:52   US Abdomen Limited  Result Date: 11/20/2021 CLINICAL DATA:  Ascites, cirrhosis. EXAM: LIMITED ABDOMEN ULTRASOUND FOR ASCITES TECHNIQUE: Limited ultrasound survey for ascites was performed in all four abdominal quadrants. COMPARISON:  None Available. FINDINGS: Four-quadrant ultrasound demonstrates no significant abdominal ascites. IMPRESSION: No significant abdominal ascites. Electronically Signed   By: San Morelle M.D.   On: 11/20/2021 14:34   CT Cervical Spine Wo Contrast  Result Date: 11/20/2021 CLINICAL DATA:  Trauma EXAM: CT CERVICAL SPINE WITHOUT CONTRAST TECHNIQUE: Multidetector CT imaging of the cervical spine was performed without intravenous contrast. Multiplanar CT image reconstructions were also generated. RADIATION DOSE REDUCTION: This exam  was performed according to the departmental dose-optimization program which includes automated exposure control, adjustment of the mA and/or kV according to patient size and/or use of iterative reconstruction technique. COMPARISON:  None Available. FINDINGS: Alignment: Degenerative reversal of the normal cervical lordosis. Skull base and vertebrae: No acute fracture. No primary bone lesion or focal pathologic process. Soft tissues and spinal canal: No prevertebral fluid or swelling. No visible canal hematoma. Disc levels: Moderate to severe disc space height loss and anterior osteophytosis of C5 through T1, with otherwise mild disc space height loss and osteophytosis of the upper cervical levels.  Upper chest: Negative. Other: None. IMPRESSION: 1. No acute fracture or static subluxation of the cervical spine. 2. Moderate to severe disc space height loss and anterior osteophytosis of C5 through T1, with associated degenerative reversal of the normal cervical lordosis. Electronically Signed   By: Delanna Ahmadi M.D.   On: 11/20/2021 10:35   CT HEAD WO CONTRAST (5MM)  Result Date: 11/20/2021 CLINICAL DATA:  Altered mental status, nontraumatic (Ped 0-17y) EXAM: CT HEAD WITHOUT CONTRAST TECHNIQUE: Contiguous axial images were obtained from the base of the skull through the vertex without intravenous contrast. RADIATION DOSE REDUCTION: This exam was performed according to the departmental dose-optimization program which includes automated exposure control, adjustment of the mA and/or kV according to patient size and/or use of iterative reconstruction technique. COMPARISON:  MRI head April 16, 2018. FINDINGS: Brain: No evidence of acute infarction, hemorrhage, hydrocephalus, extra-axial collection or mass lesion/mass effect. Cerebral atrophy. Vascular: No hyperdense vessel identified. Calcific intracranial atherosclerosis. Skull: No acute fracture. Sinuses/Orbits: Largely clear sinuses.  No acute orbital findings. Other: No mastoid effusions. IMPRESSION: 1. No evidence of acute intracranial abnormality. 2.  Cerebral atrophy (ICD10-G31.9). Electronically Signed   By: Margaretha Sheffield M.D.   On: 11/20/2021 10:31       Today   Subjective    Daniel Knapp today has no headache,no chest abdominal pain,no new weakness tingling or numbness, feels much better wants to go home today.     Objective   Blood pressure 133/73, pulse (!) 57, temperature (!) 97.4 F (36.3 C), temperature source Oral, resp. rate 15, height '6\' 2"'$  (1.88 m), weight 88.9 kg, SpO2 100 %.  No intake or output data in the 24 hours ending 11/27/21 0923  Exam  Awake, pleasantly confused, No new F.N deficits,     [place].AT,PERRAL Supple Neck,   Symmetrical Chest wall movement, Good air movement bilaterally, CTAB RRR,No Gallops,   +ve B.Sounds, Abd Soft, Non tender,  No Cyanosis, Clubbing or edema    Data Review   Recent Labs  Lab 11/24/21 1524 11/24/21 2335 11/25/21 0430 11/26/21 0619 11/27/21 0239  WBC 6.2 6.6 6.6 9.0 6.2  HGB 9.0* 8.6* 9.7* 9.7* 9.3*  HCT 28.9* 28.4* 31.0* 32.1* 29.7*  PLT 145* 134* 154 149* 136*  MCV 87.0 87.9 87.1 90.2 87.6  MCH 27.1 26.6 27.2 27.2 27.4  MCHC 31.1 30.3 31.3 30.2 31.3  RDW 18.6* 18.8* 18.6* 19.5* 19.3*  LYMPHSABS  --   --   --   --  1.0  MONOABS  --   --   --   --  0.5  EOSABS  --   --   --   --  0.1  BASOSABS  --   --   --   --  0.0    Recent Labs  Lab 11/20/21 1056 11/21/21 0502 11/22/21 0541 11/23/21 0144 11/25/21 0430 11/26/21 0251  NA  --  144  141 144 138 137  K  --  3.5 3.3* 3.5 3.3* 3.8  CL  --  110 108 111 100 107  CO2  --  '28 28 29 27 23  '$ GLUCOSE  --  113* 110* 107* 116* 99  BUN  --  '16 18 21 '$ 5* 7*  CREATININE  --  0.93 1.14 1.15 1.07 1.07  CALCIUM  --  8.7* 8.6* 8.5* 8.8* 8.4*  AST  --  28  --   --  32 30  ALT  --  15  --   --  28 27  ALKPHOS  --  54  --   --  85 83  BILITOT  --  1.3*  --   --  0.8 0.5  ALBUMIN  --  3.3*  --   --  3.4* 3.0*  MG  --   --   --   --  2.2 2.1  INR 1.4* 1.3*  --   --  1.2  --   AMMONIA <10  --   --   --   --   --     Total Time in preparing paper work, data evaluation and todays exam - 50 minutes  Lala Lund M.D on 11/27/2021 at 9:23 AM  [facility]

## 2021-11-27 NOTE — TOC Progression Note (Signed)
Transition of Care [facility]) - Progression Note    Patient Details  Name: Daniel Knapp MRN: [id number] Date of Birth: 08-23-1950  Transition of Care [facility] [place]) CM/SW Contact  Bartholomew Crews, RN Phone Number: [phone] 11/27/2021, 11:14 AM  Clinical Narrative:     Spoke with patient's sister, [name], at [phone] to discuss post acute transition. [name] stated that patient lives alone. 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 [name] asked, "Can he walk?" Noted that patient did not ambulate with PT on Friday. [name] stated that she would like patient to go to SNF for rehab and that he needs to be able get up the stairs. CSW to continue SNF process - PT to see for updated notes. TOC following.   Expected Discharge Plan: [place] Barriers to Discharge: Continued Medical Work up, SNF Pending bed offer, Ship broker  Expected Discharge Plan and Services Expected Discharge Plan: [place] In-house Referral: Clinical Social Work   Post Acute Care Choice: [place] Living arrangements for the past 2 months: Single Family Home Expected Discharge Date: 11/27/21                                     Social Determinants of Health (SDOH) Interventions    Readmission Risk Interventions     No data to display

## 2021-11-27 NOTE — Care Management Important Message (Signed)
Important Message  Patient Details  Name: Daniel Knapp MRN: [id number] Date of Birth: 12/29/1950   Medicare Important Message Given:  Yes     Memory Argue 11/27/2021, 3:12 PM

## 2021-11-27 NOTE — TOC Progression Note (Addendum)
Transition of Care [facility]) - Progression Note    Patient Details  Name: Daniel Knapp MRN: [id number] Date of Birth: 1950/08/05  Transition of Care [facility]) CM/SW [place], LCSW Phone Number: 11/27/2021, 8:46 AM  Clinical Narrative:    8:46am-CSW requested [place] review referral. Patient not managed by [employer].   CSW spoke with patient's sister, [name]. She confirmed that she still wants patient to rehab at [facility]. CSW requesting PT see patient as soon as possible for insurance authorization.   9:34am-Per MD he has spoken with [name] and she is now in agreement to take patient home with home health today.    Expected Discharge Plan: [place] Barriers to Discharge: Continued Medical Work up, SNF Pending bed offer, Ship broker  Expected Discharge Plan and Services Expected Discharge Plan: [place] In-house Referral: Clinical Social Work   Post Acute Care Choice: [place] Living arrangements for the past 2 months: Single Family Home                                       Social Determinants of Health (SDOH) Interventions    Readmission Risk Interventions     No data to display

## 2021-11-27 NOTE — Discharge Instructions (Signed)
Follow with Primary MD Derinda Late, MD in 7 days, also follow-up with your gastroenterologist within 5 to 7 days.  Get CBC, CMP, 2 view Chest X ray -  checked next visit within 1 week by Primary MD or SNF MD    Activity: As tolerated with Full fall precautions use walker/cane & assistance as needed  Disposition Home   Diet: Dysphagia 3 diet with feeding assistance and aspiration precautions.  Special Instructions: If you have smoked or chewed Tobacco  in the last 2 yrs please stop smoking, stop any regular Alcohol  and or any Recreational drug use.  On your next visit with your primary care physician please Get Medicines reviewed and adjusted.  Please request your Prim.MD to go over all Hospital Tests and Procedure/Radiological results at the follow up, please get all Hospital records sent to your Prim MD by signing hospital release before you go home.  If you experience worsening of your admission symptoms, develop shortness of breath, life threatening emergency, suicidal or homicidal thoughts you must seek medical attention immediately by calling 911 or calling your MD immediately  if symptoms less severe.  You Must read complete instructions/literature along with all the possible adverse reactions/side effects for all the Medicines you take and that have been prescribed to you. Take any new Medicines after you have completely understood and accpet all the possible adverse reactions/side effects.

## 2021-11-27 NOTE — Progress Notes (Signed)
Physical Therapy Treatment Patient Details Name: Daniel Knapp MRN: [id number] DOB: November 11, 1950 Today's Date: 11/27/2021   History of Present Illness 71 y.o. male who presented to [facility] on 11/20/2021 after he was found down at home with alteration in his mental status. Hgb 3.7 on admission with improvement to 7.8 after 4 units of blood. Admitted with dx of upper GI bleed. Underwent EGD 6/13. Transferred to [facility] 6/15 for consideration of BRTO. PMH: dementia, untreated hepatitis C, cirrhosis, and hypertension    PT Comments    Patient seen for mobility training. Very internally distracted by itching (frequently stopping to scratch his legs). Required +2 mod assist for come to sit EOB and to come to stand with RW. Able to progress to ambulation with RW x 40 ft with min assist for balance and +2 for safety/equipment. Patient very pleasant and following gestural cues ~50% of the time (again, very distracted by his itching). Continue to feel pt will need SNF for continued therapies prior to discharge home with family's assist.     Recommendations for follow up therapy are one component of a multi-disciplinary discharge planning process, led by the attending physician.  Recommendations may be updated based on patient status, additional functional criteria and insurance authorization.  Follow Up Recommendations  Skilled nursing-short term rehab (<3 hours/day)     Assistance Recommended at Discharge Frequent or constant Supervision/Assistance  Patient can return home with the following A lot of help with bathing/dressing/bathroom;Assistance with cooking/housework;Direct supervision/assist for medications management;Assist for transportation;A lot of help with walking and/or transfers;Direct supervision/assist for financial management;Help with stairs or ramp for entrance   Equipment Recommendations  Rolling walker (2 wheels);BSC/3in1;Hospital bed    Recommendations for Other Services        Precautions / Restrictions Precautions Precautions: Fall Precaution Comments: hx of dementia, decreased ability to follow commands Restrictions Weight Bearing Restrictions: No     Mobility  Bed Mobility Overal bed mobility: Needs Assistance Bed Mobility: Rolling, Sidelying to Sit Rolling: Max assist Sidelying to sit: Max assist       General bed mobility comments: increased assist due to poor initiation, continual multi modal cues; pt with poor understanding of task    Transfers Overall transfer level: Needs assistance Equipment used: Rolling walker (2 wheels) Transfers: Sit to/from Stand Sit to Stand: Mod assist, +2 physical assistance           General transfer comment: assist to initiate and power up. Difficult with motor planning,  Continual multi modal cues due to dementia    Ambulation/Gait Ambulation/Gait assistance: Min assist, +2 safety/equipment Gait Distance (Feet): 40 Feet Assistive device: Rolling walker (2 wheels) Gait Pattern/deviations: Step-through pattern, Decreased stride length, Narrow base of support Gait velocity: decr Gait velocity interpretation: <1.8 ft/sec, indicate of risk for recurrent falls   General Gait Details: pt follows tech walking with his IV pole in front of him; slight difficulty getting him to turn around with imbalance x 2 requiring min assist to recover   Stairs             Wheelchair Mobility    Modified Rankin (Stroke Patients Only)       Balance Overall balance assessment: Needs assistance Sitting-balance support: No upper extremity supported, Feet supported Sitting balance-Leahy Scale: Good Sitting balance - Comments: Able to sit unsupported EOB once feet on the floor   Standing balance support: Bilateral upper extremity supported Standing balance-Leahy Scale: Poor Standing balance comment: 2person assist to come to stand; 1 person assist  with RW to maintain                             Cognition Arousal/Alertness: Awake/alert Behavior During Therapy: Flat affect Overall Cognitive Status: History of cognitive impairments - at baseline                                 General Comments: Dementia at baseline, needed assist with all selfcare tasks and mobility recently.  Unable to answer any questions asked by therapist except his first name.        Exercises      General Comments General comments (skin integrity, edema, etc.): pt internally distracted by itching (bil legs)      Pertinent Vitals/Pain Pain Assessment Pain Assessment: Faces Faces Pain Scale: No hurt    Home Living                          Prior Function            PT Goals (current goals can now be found in the care plan section) Acute Rehab PT Goals Patient Stated Goal: unable to state Time For Goal Achievement: 12/08/21 Potential to Achieve Goals: Fair Progress towards PT goals: Progressing toward goals    Frequency    Min 2X/week      PT Plan Current plan remains appropriate    Co-evaluation              AM-PAC PT "6 Clicks" Mobility   Outcome Measure  Help needed turning from your back to your side while in a flat bed without using bedrails?: A Lot Help needed moving from lying on your back to sitting on the side of a flat bed without using bedrails?: A Lot Help needed moving to and from a bed to a chair (including a wheelchair)?: Total Help needed standing up from a chair using your arms (e.g., wheelchair or bedside chair)?: Total Help needed to walk in hospital room?: Total Help needed climbing 3-5 steps with a railing? : Total 6 Click Score: 8    End of Session Equipment Utilized During Treatment: Gait belt Activity Tolerance: Treatment limited secondary to medical complications (Comment) (HR max 144 bpm) Patient left: in chair;with call bell/phone within reach;with chair alarm set;with restraints reapplied (bil mitts) Nurse Communication:  Mobility status;Other (comment) (continue to recommend SNF) PT Visit Diagnosis: Muscle weakness (generalized) (M62.81);Other abnormalities of gait and mobility (R26.89)     Time: 1194-1740 PT Time Calculation (min) (ACUTE ONLY): 24 min  Charges:  $Gait Training: 8-22 mins $Therapeutic Activity: 8-22 mins                      [place]  Office [phone]    Rexanne Mano 11/27/2021, 11:34 AM

## 2021-11-27 NOTE — TOC Progression Note (Addendum)
Transition of Care [facility]) - Progression Note    Patient Details  Name: Daniel Knapp MRN: [id number] Date of Birth: 1951-04-04  Transition of Care [facility]) CM/SW [place], LCSW Phone Number: 11/27/2021, 11:43 AM  Clinical Narrative:    11:43am-No beds available at [facility] Therapist, music.   CSW updated [name] that [facility] has also offered. She is requesting [facility]. CSW requested they begin insurance process as patient is not managed by [employer].    Expected Discharge Plan: [place] Barriers to Discharge: Continued Medical Work up, SNF Pending bed offer, Ship broker  Expected Discharge Plan and Services Expected Discharge Plan: [place] In-house Referral: Clinical Social Work   Post Acute Care Choice: [place] Living arrangements for the past 2 months: Single Family Home Expected Discharge Date: 11/27/21                                     Social Determinants of Health (SDOH) Interventions    Readmission Risk Interventions     No data to display

## 2021-11-27 NOTE — Consult Note (Signed)
   [facility] Inpatient Consult   11/27/2021  Daniel Knapp 1950-11-13 [id number]  [place] Organization [ACO] Patient: [employer]  Primary Care Provider:  Derinda Late, MD, [facility]  Patient screened for hospitalization with showing as a transfer [facility] to [facility].  Plan:  Continue to follow progress and disposition to assess for post hospital care management needs.   Patient is in a [employer] SNP program for care management needs. Currently, patient is being recommended for SNF.  Will sign off. For questions contact:   Natividad Brood, RN BSN [place] [facility] Liaison  [phone] business mobile phone Toll free office [phone]  Fax number: [phone] [email or web address]

## 2021-11-28 DIAGNOSIS — I864 Gastric varices: Principal | ICD-10-CM

## 2021-11-28 LAB — CBC WITH DIFFERENTIAL/PLATELET
Abs Immature Granulocytes: 0.03 10*3/uL (ref 0.00–0.07)
Basophils Absolute: 0 10*3/uL (ref 0.0–0.1)
Basophils Relative: 1 %
Eosinophils Absolute: 0.1 10*3/uL (ref 0.0–0.5)
Eosinophils Relative: 2 %
HCT: 28.5 % — ABNORMAL LOW (ref 39.0–52.0)
Hemoglobin: 8.9 g/dL — ABNORMAL LOW (ref 13.0–17.0)
Immature Granulocytes: 1 %
Lymphocytes Relative: 17 %
Lymphs Abs: 1 10*3/uL (ref 0.7–4.0)
MCH: 27 pg (ref 26.0–34.0)
MCHC: 31.2 g/dL (ref 30.0–36.0)
MCV: 86.4 fL (ref 80.0–100.0)
Monocytes Absolute: 0.5 10*3/uL (ref 0.1–1.0)
Monocytes Relative: 8 %
Neutro Abs: 4.4 10*3/uL (ref 1.7–7.7)
Neutrophils Relative %: 71 %
Platelets: 131 10*3/uL — ABNORMAL LOW (ref 150–400)
RBC: 3.3 MIL/uL — ABNORMAL LOW (ref 4.22–5.81)
RDW: 19 % — ABNORMAL HIGH (ref 11.5–15.5)
WBC: 6 10*3/uL (ref 4.0–10.5)
nRBC: 0 % (ref 0.0–0.2)

## 2021-11-28 NOTE — Consult Note (Signed)
   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 Medicare assigned team member in that external care management program.  Of note, [facility] Care Management services does not replace or interfere with any services that are arranged by inpatient case management or social work.  For additional questions or referrals please contact:    Plan: Will sign off.    Natividad Brood, RN BSN [facility] Liaison  [phone] business mobile phone Toll free office [phone]  [email or web address] number: [phone] [email or web address]

## 2021-11-28 NOTE — Progress Notes (Signed)
PROGRESS NOTE                                                                                                                                                                                                             Patient Demographics:    Daniel Knapp, is a 71 y.o. male, DOB - 11-24-1950, BZJ:[id number]  Outpatient Primary MD for the patient is Derinda Late, MD    LOS - 5  Admit date - 11/23/2021    GI Bleed, from [facility]      Brief Narrative (HPI from H&P)  71 y.o. male with medical history significant for dementia, untreated hepatitis C, cirrhosis, and hypertension who presented to [facility] on 11/20/2021 with after he was found down at home with alteration in his mental status.  Patient had been less responsive for the preceding 3 days, had some bright red blood per rectum noted by his caretaker who attributed this to hemorrhoids, and had not seen any vomiting or hematemesis.    Patient medically stable and discharged on 11/27/2021 however sister unsure about placement initially agreed to take him home but now wants SNF.  Kindly see documentation in the discharge summary.    Subjective:   Patient in bed pleasantly confused but in no distress denies any headache chest pain or abdominal pain.   Assessment  & Plan :   1. 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  H&H currently stable.  He underwent Hepatic parenchymal track coil embolization by IR on 11/25/2021 was unsuccessful, H&H remained stable, will switch to oral PPI and discontinue IV octreotide. Abdominal ultrasound rules out ascites, will stop Rocephin after total of 5 doses including the doses at [facility], no signs of ongoing GI bleed if stable  discharge on 11/27/2021.   2. Cirrhosis   - Suspected secondary to untreated hepatitis C , MELD score was 12 , appears compensated abdominal ultrasound does not confirm any ascites.  Outpatient follow-up with GI postdischarge.   3. Hypertension  - Treat as-needed only for now    4.  History of of underlying Alzheimer's dementia   -  Delirium precautions, minimize narcotics and benzodiazepine use.   5. IDA  - Received iron infusion at [facility]   6. ? Pancreatic change on CT - follow with PCP and Primary GI MD in 1-2 weeks. Updtaed sister [name] in detail.      Condition - Extremely Guarded  Family Communication  :   Agnes Rebecca [phone] on 11/24/2021, she states that she would prefer her brother to come home  Code Status :  Full  Consults  :  GI, IR  PUD Prophylaxis : PPI   Procedures  :     IR - 11/25/21  Procedure:  1) Percutaneous, transhepatic portal venogram 2) Catheterization and venography of posterior gastric vein 3) Hepatic parenchymal track coil embolization - unsuccessful   CT -   VASCULAR 1. No evidence of active bleeding. 2. No evidence of aneurysm, dissection or other acute vascular abnormality.   NON-VASCULAR 1. Abnormal appearance of the pancreatic tail which is now inseparable from the adjacent and abutting splenic parenchyma. There is inflammatory stranding in the region as well as slight interval enlargement of a dystrophic calcification compared to prior imaging from 2017. Further, there is new wedge-shaped infarct in the same region. Differential considerations include focal pancreatitis of the pancreatic tail with involvement of the spleen including a small splenic infarct versus pancreatic tail neoplasm with direct splenic invasion. Small volume reactive fluid in the peripancreatic soft tissues extending along the left pericolic gutter and low within the anatomic pelvis. 2. Abnormal retroperitoneal lymphadenopathy predominantly in the right para aortic space.  Differential considerations include lymphoma versus metastatic prostate or bladder cancer. 3. Similar appearance of nodular extension of the prostate gland into the right bladder trigone compared to prior imaging from 02/21/2016. If anything, findings are slightly less conspicuous than previously seen. 4. Colonic diverticular disease without CT evidence of active inflammation. 5. Bilateral nonobstructing nephrolithiasis. 6. Additional ancillary findings as above.      Disposition Plan  :    Status is: Inpatient  DVT Prophylaxis  :    SCDs Start: 11/23/21 2309    Lab Results  Component Value Date   PLT 131 (L) 11/28/2021    Diet :  Diet Order             DIET DYS 3 Room service appropriate? Yes; Fluid consistency: Thin  Diet effective now                    Inpatient Medications  Scheduled Meds:  amLODipine  10 mg Oral Q1500   divalproex  250 mg Oral Q1500   donepezil  10 mg Oral Q1500   finasteride  5 mg Oral Q1500   memantine  5 mg Oral BID   pantoprazole  40 mg Oral Daily   sodium chloride flush  3 mL Intravenous Q12H   tamsulosin  0.4 mg Oral Q1500   vitamin B-12  1,000 mcg Oral Q1500   Continuous Infusions:   PRN Meds:.  Antibiotics  :    Anti-infectives (From admission, onward)    Start     Dose/Rate Route Frequency Ordered Stop   11/24/21 1000  cefTRIAXone (ROCEPHIN) 2 g in sodium chloride 0.9 % 100 mL IVPB  Status:  Discontinued        2 g 200 mL/hr over 30 Minutes Intravenous Every 24 hours 11/23/21 2314 11/26/21 0900        Time Spent in minutes  30   Lala Lund M.D on 11/28/2021 at 9:04 AM  To page go to [email or web address]   [facility] -  Office  [phone]  See all Orders from today for further details    Objective:   Vitals:   11/27/21 1935 11/27/21 2302 11/28/21 0414 11/28/21 0827  BP: 96/66 107/69 114/81 106/70  Pulse: 80 80  76  Resp: '18 18 17 16  '$ Temp: 97.8 F (36.6 C) 97.8 F (36.6 C) 98.2 F (36.8 C) 97.6 F  (36.4 C)  TempSrc: Axillary Oral Oral Axillary  SpO2:    96%  Weight:      Height:        Wt Readings from Last 3 Encounters:  11/23/21 88.9 kg  11/20/21 90 kg  09/30/20 90.7 kg     Intake/Output Summary (Last 24 hours) at 11/28/2021 0904 Last data filed at 11/28/2021 0827 Gross per 24 hour  Intake --  Output 150 ml  Net -150 ml     Physical Exam  Awake pleasantly confused, No new F.N deficits, Normal affect Ru[place].AT,PERRAL Supple Neck, No JVD,   Symmetrical Chest wall movement, Good air movement bilaterally, CTAB RRR,No Gallops, Rubs or new Murmurs,  +ve B.Sounds, Abd Soft, No tenderness,   No Cyanosis, Clubbing or edema       Data Review:    CBC Recent Labs  Lab 11/24/21 2335 11/25/21 0430 11/26/21 0619 11/27/21 0239 11/28/21 0206  WBC 6.6 6.6 9.0 6.2 6.0  HGB 8.6* 9.7* 9.7* 9.3* 8.9*  HCT 28.4* 31.0* 32.1* 29.7* 28.5*  PLT 134* 154 149* 136* 131*  MCV 87.9 87.1 90.2 87.6 86.4  MCH 26.6 27.2 27.2 27.4 27.0  MCHC 30.3 31.3 30.2 31.3 31.2  RDW 18.8* 18.6* 19.5* 19.3* 19.0*  LYMPHSABS  --   --   --  1.0 1.0  MONOABS  --   --   --  0.5 0.5  EOSABS  --   --   --  0.1 0.1  BASOSABS  --   --   --  0.0 0.0    Electrolytes Recent Labs  Lab 11/22/21 0541 11/23/21 0144 11/25/21 0430 11/26/21 0251  NA 141 144 138 137  K 3.3* 3.5 3.3* 3.8  CL 108 111 100 107  CO2 '28 29 27 23  '$ GLUCOSE 110* 107* 116* 99  BUN 18 21 5* 7*  CREATININE 1.14 1.15 1.07 1.07  CALCIUM 8.6* 8.5* 8.8* 8.4*  AST  --   --  32 30  ALT  --   --  28 27  ALKPHOS  --   --  85 83  BILITOT  --   --  0.8 0.5  ALBUMIN  --   --  3.4* 3.0*  MG  --   --  2.2 2.1  INR  --   --  1.2  --      Micro Results No results found for this or any previous visit (from the past 240 hour(s)).  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 Plan for percutaneous, transhepatic portal venogram and possible variceal obliteration. EXAM: 1. Ultrasound-guided transhepatic portal vein access 2. Portal venogram 3. Selective catheterization and venography of posterior gastric vein 4. Coil embolization of hepatic parenchymal catheter track MEDICATIONS: None. ANESTHESIA/SEDATION: Moderate (conscious) sedation was employed during this procedure. A total of Versed 3 mg and Fentanyl  100 mcg was administered intravenously. Moderate Sedation Time: 103 minutes. The patient's level of consciousness and vital signs were monitored continuously by radiology nursing throughout the procedure under my direct supervision. CONTRAST:  75 mL Omnipaque 300, intravenous FLUOROSCOPY: Radiation Exposure Index (as provided by the fluoroscopic device): 096 mGy Kerma COMPLICATIONS: None immediate. PROCEDURE: Informed written consent was obtained from the patient's sister and power of attorney, [name], after a thorough discussion of the procedural risks, benefits and alternatives. All questions were addressed. Maximal Sterile Barrier Technique was utilized including caps, mask, sterile gowns, sterile gloves, sterile drape, hand hygiene and skin antiseptic. A timeout was performed prior to the initiation of the procedure. The right mid axillary upper abdomen was prepped and draped in standard fashion. Ultrasound evaluation demonstrated patent portal vein in the peripheral aspect of the right lobe of the liver. Subdermal Local anesthesia was provided with 1% lidocaine. Small skin nick was made. Under direct ultrasound visualization, a radical branch of the right portal vein was accessed with a 21 gauge Chiba needle. A wire was advanced to the central, main portal vein. A 6 French Accustick set was then advanced to the right portal vein and the wire was removed. Hand injection of contrast demonstrated  appropriate intraluminal position. A Wholey wire was then inserted over which the 6 [place] sheath was exchanged for a 7 [place], braided vascular sheath. An angled tip, 5 French catheter was then directed to the splenic vein. Catheter was exchanged for a Omni Flush catheter and splenic venogram was performed. The splenic vein is widely patent with brisk antegrade flow. There is no significant reflux into gastric varices. The catheter was exchanged for a C2 catheter which was used to identify an cannulate the posterior gastric vein. The catheter was exchanged for a 7 mm x 2 cm mustang balloon. The balloon was insufflated in the central aspect of the posterior gastric vein for occlusive purposes. Venogram was then performed in multiple obliquities which demonstrated mildly prominent gastric varices about the fundus with multiple draining veins, no evidence of active hemorrhage. There was neck attempt at exchanging the indwelling sheath and balloon occlusion catheter, however tortuosity of the portal vein thwarted these attempts. Therefore, given the overall patient condition, mildly dilated varices, and technical difficulties due to tortuosity, attempts at variceal bleed aeration were aborted. The indwelling catheter was removed. The sheath was retracted to the peripheral aspect the right lobe of the liver. Under direct fluoroscopic visualization, a single 6 mm Nester coil was deployed in the peripheral sheath track and hemostasis was achieved with brief manual compression. Patient tolerated the procedure well was transferred back to the floor in stable condition. 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 The posterior gastric vein is widely patent with multiple draining outflow veins, and there is no evidence of splenic vein thrombosis as initially queried. Given these findings in addition to the patient's overall condition, attempts at variceal obliteration were  aborted. Ruthann Cancer, MD [facility] Radiology Electronically Signed   By: Ruthann Cancer M.D.   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 Plan for percutaneous, transhepatic portal venogram and possible variceal obliteration. EXAM: 1. Ultrasound-guided transhepatic portal vein access 2. Portal venogram 3. Selective catheterization and venography of posterior gastric  vein 4. Coil embolization of hepatic parenchymal catheter track MEDICATIONS: None. ANESTHESIA/SEDATION: Moderate (conscious) sedation was employed during this procedure. A total of Versed 3 mg and Fentanyl 100 mcg was administered intravenously. Moderate Sedation Time: 103 minutes. The patient's level of consciousness and vital signs were monitored continuously by radiology nursing throughout the procedure under my direct supervision. CONTRAST:  75 mL Omnipaque 300, intravenous FLUOROSCOPY: Radiation Exposure Index (as provided by the fluoroscopic device): 076 mGy Kerma COMPLICATIONS: None immediate. PROCEDURE: Informed written consent was obtained from the patient's sister and power of attorney, [name], after a thorough discussion of the procedural risks, benefits and alternatives. All questions were addressed. Maximal Sterile Barrier Technique was utilized including caps, mask, sterile gowns, sterile gloves, sterile drape, hand hygiene and skin antiseptic. A timeout was performed prior to the initiation of the procedure. The right mid axillary upper abdomen was prepped and draped in standard fashion. Ultrasound evaluation demonstrated patent portal vein in the peripheral aspect of the right lobe of the liver. Subdermal Local anesthesia was provided with 1% lidocaine. Small skin  nick was made. Under direct ultrasound visualization, a radical branch of the right portal vein was accessed with a 21 gauge Chiba needle. A wire was advanced to the central, main portal vein. A 6 French Accustick set was then advanced to the right portal vein and the wire was removed. Hand injection of contrast demonstrated appropriate intraluminal position. A Wholey wire was then inserted over which the 6 [place] sheath was exchanged for a 7 [place], braided vascular sheath. An angled tip, 5 French catheter was then directed to the splenic vein. Catheter was exchanged for a Omni Flush catheter and splenic venogram was performed. The splenic vein is widely patent with brisk antegrade flow. There is no significant reflux into gastric varices. The catheter was exchanged for a C2 catheter which was used to identify an cannulate the posterior gastric vein. The catheter was exchanged for a 7 mm x 2 cm mustang balloon. The balloon was insufflated in the central aspect of the posterior gastric vein for occlusive purposes. Venogram was then performed in multiple obliquities which demonstrated mildly prominent gastric varices about the fundus with multiple draining veins, no evidence of active hemorrhage. There was neck attempt at exchanging the indwelling sheath and balloon occlusion catheter, however tortuosity of the portal vein thwarted these attempts. Therefore, given the overall patient condition, mildly dilated varices, and technical difficulties due to tortuosity, attempts at variceal bleed aeration were aborted. The indwelling catheter was removed. The sheath was retracted to the peripheral aspect the right lobe of the liver. Under direct fluoroscopic visualization, a single 6 mm Nester coil was deployed in the peripheral sheath track and hemostasis was achieved with brief manual compression. Patient tolerated the procedure well was transferred back to the floor in stable condition. IMPRESSION: Mildly dilated fundal  gastric varices arising from the posterior gastric vein. The posterior gastric vein is widely patent with multiple draining outflow veins, and there is no evidence of splenic vein thrombosis as initially queried. Given these findings in addition to the patient's overall condition, attempts at variceal obliteration were aborted. Ruthann Cancer, MD [facility] Radiology Electronically Signed   By: Ruthann Cancer M.D.   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 Plan  for percutaneous, transhepatic portal venogram and possible variceal obliteration. EXAM: 1. Ultrasound-guided transhepatic portal vein access 2. Portal venogram 3. Selective catheterization and venography of posterior gastric vein 4. Coil embolization of hepatic parenchymal catheter track MEDICATIONS: None. ANESTHESIA/SEDATION: Moderate (conscious) sedation was employed during this procedure. A total of Versed 3 mg and Fentanyl 100 mcg was administered intravenously. Moderate Sedation Time: 103 minutes. The patient's level of consciousness and vital signs were monitored continuously by radiology nursing throughout the procedure under my direct supervision. CONTRAST:  75 mL Omnipaque 300, intravenous FLUOROSCOPY: Radiation Exposure Index (as provided by the fluoroscopic device): 983 mGy Kerma COMPLICATIONS: None immediate. PROCEDURE: Informed written consent was obtained from the patient's sister and power of attorney, [name], after a thorough discussion of the procedural risks, benefits and alternatives. All questions were addressed. Maximal Sterile Barrier Technique was utilized including caps, mask, sterile gowns, sterile gloves, sterile drape, hand hygiene and skin antiseptic. A  timeout was performed prior to the initiation of the procedure. The right mid axillary upper abdomen was prepped and draped in standard fashion. Ultrasound evaluation demonstrated patent portal vein in the peripheral aspect of the right lobe of the liver. Subdermal Local anesthesia was provided with 1% lidocaine. Small skin nick was made. Under direct ultrasound visualization, a radical branch of the right portal vein was accessed with a 21 gauge Chiba needle. A wire was advanced to the central, main portal vein. A 6 French Accustick set was then advanced to the right portal vein and the wire was removed. Hand injection of contrast demonstrated appropriate intraluminal position. A Wholey wire was then inserted over which the 6 [place] sheath was exchanged for a 7 [place], braided vascular sheath. An angled tip, 5 French catheter was then directed to the splenic vein. Catheter was exchanged for a Omni Flush catheter and splenic venogram was performed. The splenic vein is widely patent with brisk antegrade flow. There is no significant reflux into gastric varices. The catheter was exchanged for a C2 catheter which was used to identify an cannulate the posterior gastric vein. The catheter was exchanged for a 7 mm x 2 cm mustang balloon. The balloon was insufflated in the central aspect of the posterior gastric vein for occlusive purposes. Venogram was then performed in multiple obliquities which demonstrated mildly prominent gastric varices about the fundus with multiple draining veins, no evidence of active hemorrhage. There was neck attempt at exchanging the indwelling sheath and balloon occlusion catheter, however tortuosity of the portal vein thwarted these attempts. Therefore, given the overall patient condition, mildly dilated varices, and technical difficulties due to tortuosity, attempts at variceal bleed aeration were aborted. The indwelling catheter was removed. The sheath was retracted to the peripheral aspect  the right lobe of the liver. Under direct fluoroscopic visualization, a single 6 mm Nester coil was deployed in the peripheral sheath track and hemostasis was achieved with brief manual compression. Patient tolerated the procedure well was transferred back to the floor in stable condition. 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 The posterior gastric vein is widely patent with multiple draining outflow veins, and there is no evidence of splenic vein thrombosis as initially queried. Given these findings in addition to the patient's overall condition, attempts at variceal obliteration were aborted. Ruthann Cancer, MD [facility] Radiology Electronically Signed   By: Ruthann Cancer M.D.   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 Plan for percutaneous, transhepatic portal venogram and possible variceal obliteration. EXAM: 1. Ultrasound-guided transhepatic portal vein access 2. Portal venogram 3. Selective catheterization and venography of posterior gastric vein 4. Coil embolization of hepatic parenchymal catheter track MEDICATIONS: None. ANESTHESIA/SEDATION: Moderate (conscious) sedation was employed during this procedure. A total of Versed 3 mg and Fentanyl 100 mcg was administered intravenously. Moderate Sedation Time: 103 minutes. The patient's level of consciousness and vital signs were monitored continuously by radiology nursing throughout the procedure under my direct supervision. CONTRAST:  75 mL Omnipaque 300, intravenous FLUOROSCOPY: Radiation Exposure Index (as provided by the fluoroscopic device): 952 mGy Kerma COMPLICATIONS: None immediate. PROCEDURE: Informed written consent was  obtained from the patient's sister and power of attorney, [name], after a thorough discussion of the procedural risks, benefits and alternatives. All questions were addressed. Maximal Sterile Barrier Technique was utilized including caps, mask, sterile gowns, sterile gloves, sterile drape, hand hygiene and skin antiseptic. A timeout was performed prior to the initiation of the procedure. The right mid axillary upper abdomen was prepped and draped in standard fashion. Ultrasound evaluation demonstrated patent portal vein in the peripheral aspect of the right lobe of the liver. Subdermal Local anesthesia was provided with 1% lidocaine. Small skin nick was made. Under direct ultrasound visualization, a radical branch of the right portal vein was accessed with a 21 gauge Chiba needle. A wire was advanced to the central, main portal vein. A 6 French Accustick set was then advanced to the right portal vein and the wire was removed. Hand injection of contrast demonstrated appropriate intraluminal position. A Wholey wire was then inserted over which the 6 [place] sheath was exchanged for a 7 [place], braided vascular sheath. An angled tip, 5 French catheter was then directed to the splenic vein. Catheter was exchanged for a Omni Flush catheter and splenic venogram was performed. The splenic vein is widely patent with brisk antegrade flow. There is no significant reflux into gastric varices. The catheter was exchanged for a C2 catheter which was used to identify an cannulate the posterior gastric vein. The catheter was exchanged for a 7 mm x 2 cm mustang balloon. The balloon was insufflated in the central aspect of the posterior gastric vein for occlusive purposes. Venogram was then performed in multiple obliquities which demonstrated mildly prominent gastric varices about the fundus with multiple draining veins, no evidence of active hemorrhage. There was neck attempt at exchanging the indwelling sheath and balloon  occlusion catheter, however tortuosity of the portal vein thwarted these attempts. Therefore, given the overall patient condition, mildly dilated varices, and technical difficulties due to tortuosity, attempts at variceal bleed aeration were aborted. The indwelling catheter was removed. The sheath was retracted to the peripheral aspect the right lobe of the liver. Under direct fluoroscopic visualization, a single 6 mm Nester coil was deployed in the peripheral sheath track and hemostasis was achieved with brief manual compression. Patient tolerated the procedure well was transferred back to the floor in stable condition. 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 The posterior gastric vein is widely patent with multiple draining outflow veins, and there is no evidence of splenic vein thrombosis as initially queried. Given these findings in addition to the patient's overall condition, attempts at variceal obliteration were aborted. Ruthann Cancer, MD [facility]  [facility] Electronically Signed   By: Ruthann Cancer M.D.   On: 11/25/2021 16:38

## 2021-11-28 NOTE — Clinical Note (Incomplete)
SATURATION QUALIFICATIONS: (This note is used to comply with regulatory documentation for home oxygen)  Patient Saturations on Room Air at Rest = ***%  Patient Saturations on Room Air while Ambulating = ***%  Patient Saturations on *** Liters of oxygen while Ambulating = ***%  Please briefly explain why patient needs home oxygen: 

## 2021-11-29 DIAGNOSIS — Z7189 Other specified counseling: Secondary | ICD-10-CM | POA: Diagnosis not present

## 2021-11-29 DIAGNOSIS — K7469 Other cirrhosis of liver: Secondary | ICD-10-CM | POA: Diagnosis not present

## 2021-11-29 DIAGNOSIS — Z9079 Acquired absence of other genital organ(s): Secondary | ICD-10-CM | POA: Diagnosis not present

## 2021-11-29 DIAGNOSIS — C61 Malignant neoplasm of prostate: Secondary | ICD-10-CM | POA: Diagnosis not present

## 2021-11-29 DIAGNOSIS — I864 Gastric varices: Secondary | ICD-10-CM | POA: Diagnosis not present

## 2021-11-29 DIAGNOSIS — N132 Hydronephrosis with renal and ureteral calculous obstruction: Secondary | ICD-10-CM | POA: Diagnosis not present

## 2021-11-29 DIAGNOSIS — D500 Iron deficiency anemia secondary to blood loss (chronic): Secondary | ICD-10-CM | POA: Diagnosis not present

## 2021-11-29 DIAGNOSIS — J9811 Atelectasis: Secondary | ICD-10-CM | POA: Diagnosis not present

## 2021-11-29 DIAGNOSIS — N179 Acute kidney failure, unspecified: Secondary | ICD-10-CM | POA: Diagnosis not present

## 2021-11-29 DIAGNOSIS — Z6824 Body mass index (BMI) 24.0-24.9, adult: Secondary | ICD-10-CM | POA: Diagnosis not present

## 2021-11-29 DIAGNOSIS — K5730 Diverticulosis of large intestine without perforation or abscess without bleeding: Secondary | ICD-10-CM | POA: Diagnosis not present

## 2021-11-29 DIAGNOSIS — R7611 Nonspecific reaction to tuberculin skin test without active tuberculosis: Secondary | ICD-10-CM | POA: Diagnosis not present

## 2021-11-29 DIAGNOSIS — G9341 Metabolic encephalopathy: Secondary | ICD-10-CM | POA: Diagnosis not present

## 2021-11-29 DIAGNOSIS — R58 Hemorrhage, not elsewhere classified: Secondary | ICD-10-CM | POA: Diagnosis not present

## 2021-11-29 DIAGNOSIS — N281 Cyst of kidney, acquired: Secondary | ICD-10-CM | POA: Diagnosis not present

## 2021-11-29 DIAGNOSIS — Z515 Encounter for palliative care: Secondary | ICD-10-CM | POA: Diagnosis not present

## 2021-11-29 DIAGNOSIS — N200 Calculus of kidney: Secondary | ICD-10-CM | POA: Diagnosis present

## 2021-11-29 DIAGNOSIS — Z79899 Other long term (current) drug therapy: Secondary | ICD-10-CM | POA: Diagnosis not present

## 2021-11-29 DIAGNOSIS — G309 Alzheimer's disease, unspecified: Secondary | ICD-10-CM | POA: Diagnosis not present

## 2021-11-29 DIAGNOSIS — B1920 Unspecified viral hepatitis C without hepatic coma: Secondary | ICD-10-CM | POA: Diagnosis not present

## 2021-11-29 DIAGNOSIS — N400 Enlarged prostate without lower urinary tract symptoms: Secondary | ICD-10-CM | POA: Diagnosis not present

## 2021-11-29 DIAGNOSIS — M6281 Muscle weakness (generalized): Secondary | ICD-10-CM | POA: Diagnosis not present

## 2021-11-29 DIAGNOSIS — Z8719 Personal history of other diseases of the digestive system: Secondary | ICD-10-CM | POA: Diagnosis not present

## 2021-11-29 DIAGNOSIS — R627 Adult failure to thrive: Secondary | ICD-10-CM | POA: Diagnosis not present

## 2021-11-29 DIAGNOSIS — F03C0 Unspecified dementia, severe, without behavioral disturbance, psychotic disturbance, mood disturbance, and anxiety: Secondary | ICD-10-CM | POA: Diagnosis not present

## 2021-11-29 DIAGNOSIS — Z8249 Family history of ischemic heart disease and other diseases of the circulatory system: Secondary | ICD-10-CM | POA: Diagnosis not present

## 2021-11-29 DIAGNOSIS — R1310 Dysphagia, unspecified: Secondary | ICD-10-CM | POA: Diagnosis not present

## 2021-11-29 DIAGNOSIS — F02818 Dementia in other diseases classified elsewhere, unspecified severity, with other behavioral disturbance: Secondary | ICD-10-CM | POA: Diagnosis not present

## 2021-11-29 DIAGNOSIS — K7460 Unspecified cirrhosis of liver: Secondary | ICD-10-CM | POA: Diagnosis not present

## 2021-11-29 DIAGNOSIS — R5381 Other malaise: Secondary | ICD-10-CM | POA: Diagnosis not present

## 2021-11-29 DIAGNOSIS — E876 Hypokalemia: Secondary | ICD-10-CM | POA: Diagnosis not present

## 2021-11-29 DIAGNOSIS — I10 Essential (primary) hypertension: Secondary | ICD-10-CM | POA: Diagnosis not present

## 2021-11-29 DIAGNOSIS — K922 Gastrointestinal hemorrhage, unspecified: Secondary | ICD-10-CM | POA: Diagnosis not present

## 2021-11-29 DIAGNOSIS — F0390 Unspecified dementia without behavioral disturbance: Secondary | ICD-10-CM | POA: Diagnosis not present

## 2021-11-29 DIAGNOSIS — D509 Iron deficiency anemia, unspecified: Secondary | ICD-10-CM | POA: Diagnosis not present

## 2021-11-29 DIAGNOSIS — F0280 Dementia in other diseases classified elsewhere without behavioral disturbance: Secondary | ICD-10-CM | POA: Diagnosis not present

## 2021-11-29 DIAGNOSIS — D494 Neoplasm of unspecified behavior of bladder: Secondary | ICD-10-CM | POA: Diagnosis present

## 2021-11-29 DIAGNOSIS — D649 Anemia, unspecified: Secondary | ICD-10-CM | POA: Diagnosis not present

## 2021-11-29 DIAGNOSIS — Z66 Do not resuscitate: Secondary | ICD-10-CM | POA: Diagnosis not present

## 2021-11-29 DIAGNOSIS — R590 Localized enlarged lymph nodes: Secondary | ICD-10-CM | POA: Diagnosis present

## 2021-11-29 DIAGNOSIS — Z7401 Bed confinement status: Secondary | ICD-10-CM | POA: Diagnosis not present

## 2021-11-29 DIAGNOSIS — R4182 Altered mental status, unspecified: Secondary | ICD-10-CM | POA: Diagnosis not present

## 2021-11-29 LAB — CBC WITH DIFFERENTIAL/PLATELET
Abs Immature Granulocytes: 0.02 10*3/uL (ref 0.00–0.07)
Basophils Absolute: 0 10*3/uL (ref 0.0–0.1)
Basophils Relative: 1 %
Eosinophils Absolute: 0.1 10*3/uL (ref 0.0–0.5)
Eosinophils Relative: 2 %
HCT: 27.7 % — ABNORMAL LOW (ref 39.0–52.0)
Hemoglobin: 8.7 g/dL — ABNORMAL LOW (ref 13.0–17.0)
Immature Granulocytes: 0 %
Lymphocytes Relative: 17 %
Lymphs Abs: 1 10*3/uL (ref 0.7–4.0)
MCH: 26.7 pg (ref 26.0–34.0)
MCHC: 31.4 g/dL (ref 30.0–36.0)
MCV: 85 fL (ref 80.0–100.0)
Monocytes Absolute: 0.5 10*3/uL (ref 0.1–1.0)
Monocytes Relative: 8 %
Neutro Abs: 4.2 10*3/uL (ref 1.7–7.7)
Neutrophils Relative %: 72 %
Platelets: 136 10*3/uL — ABNORMAL LOW (ref 150–400)
RBC: 3.26 MIL/uL — ABNORMAL LOW (ref 4.22–5.81)
RDW: 19 % — ABNORMAL HIGH (ref 11.5–15.5)
WBC: 5.9 10*3/uL (ref 4.0–10.5)
nRBC: 0 % (ref 0.0–0.2)

## 2021-11-29 NOTE — TOC Progression Note (Addendum)
Transition of Care [facility]) - Progression Note    Patient Details  Name: Daniel Knapp MRN: [id number] Date of Birth: [birth year]  Transition of Care [facility]) CM/SW [place], LCSW Phone Number: 11/29/2021, 8:41 AM  Clinical Narrative:    8:41am-Still awaiting insurance approval for [facility].   11:30am-[facility] [employer]are[facility] Dresser has received insurance approval: #[id number], 11/29/21-12/04/21.    Expected Discharge Plan: [place] Barriers to Discharge: Continued Medical Work up, SNF Pending bed offer, Ship broker  Expected Discharge Plan and Services Expected Discharge Plan: [place] In-house Referral: Clinical Social Work   Post Acute Care Choice: [place] Living arrangements for the past 2 months: Single Family Home Expected Discharge Date: 11/27/21                                     Social Determinants of Health (SDOH) Interventions    Readmission Risk Interventions     No data to display

## 2021-11-29 NOTE — TOC Transition Note (Signed)
Transition of Care [facility]) - CM/SW Discharge Note   Patient Details  Name: Daniel Knapp MRN: [id number] Date of Birth: 1950/08/22  Transition of Care [facility]) CM/SW Contact:  Benard Halsted, LCSW Phone Number: 11/29/2021, 11:40 AM   Clinical Narrative:    Patient will DC to: [place] Anticipated DC date: 11/29/21 Family notified: Sister, [name] Transport by: Corey Harold   Per MD patient ready for DC to [facility]. RN to call report prior to discharge [phone]). RN, patient, patient's family, and facility notified of DC. Discharge Summary and FL2 sent to facility. DC packet on chart. Ambulance transport requested for patient.   CSW will sign off for now as social work intervention is no longer needed. Please consult [place] again if new needs arise.     Final next level of care: Skilled Nursing Facility Barriers to Discharge: Barriers Resolved   Patient Goals and CMS Choice Patient states their goals for this hospitalization and ongoing recovery are:: Rehab CMS Medicare.gov Compare Post Acute Care list provided to:: Patient Represenative (must comment) Choice offered to / list presented to : Sibling  Discharge Placement   Existing PASRR number confirmed : 11/29/21          Patient chooses bed at: [facility] Patient to be transferred to facility by: [place] Name of family member notified: [name] Patient and family notified of of transfer: 11/29/21  Discharge Plan and Services In-house Referral: Clinical Social Work   Post Acute Care Choice: [place]                               Social Determinants of Health (SDOH) Interventions     Readmission Risk Interventions     No data to display

## 2021-11-29 NOTE — Progress Notes (Signed)
PROGRESS NOTE                                                                                                                                                                                                             Patient Demographics:    Daniel Knapp, is a 71 y.o. male, DOB - Dec 08, 1950, QIO:[id number]  Outpatient Primary MD for the patient is Daniel Late, MD    LOS - 6  Admit date - 11/23/2021    GI Bleed, from [facility]      Brief Narrative (HPI from H&P)  71 y.o. male with medical history significant for dementia, untreated hepatitis C, cirrhosis, and hypertension who presented to [facility] on 11/20/2021 with after he was found down at home with alteration in his mental status.  Patient had been less responsive for the preceding 3 days, had some bright red blood per rectum noted by his caretaker who attributed this to hemorrhoids, and had not seen any vomiting or hematemesis.    Patient medically stable and discharged on 11/27/2021 however sister unsure about placement initially agreed to take him home but now wants SNF.  Kindly see documentation in the discharge summary.    Subjective:   Patient lying in bed, in no distress, pleasantly confused but denies any headache chest or abdominal pain.  No shortness of breath.  No blood in stool.   Assessment  & Plan :   1. 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  H&H currently stable.  He underwent Hepatic parenchymal track coil embolization by IR on 11/25/2021 was unsuccessful, H&H remained stable, will switch to oral PPI and discontinue IV octreotide. Abdominal ultrasound rules out ascites, will stop Rocephin after total of 5 doses including the doses  at [facility], no signs of ongoing GI bleed if stable discharge on 11/27/2021.   2. Cirrhosis   - Suspected secondary to untreated hepatitis C , MELD score was 12 , appears compensated abdominal ultrasound does not confirm any ascites.  Outpatient follow-up with GI postdischarge.   3. Hypertension  - Treat as-needed only for now    4.  History of of underlying Alzheimer's dementia   - Delirium precautions, minimize narcotics and benzodiazepine use.   5. IDA  - Received iron infusion at [facility]   6. ? Pancreatic change on CT - follow with PCP and Primary GI MD in 1-2 weeks. Updtaed [name] in detail.      Condition - Extremely Guarded  Family Communication  :   Daniel Knapp [phone] on 11/24/2021, she states that she would prefer her brother to come home  Code Status :  Full  Consults  :  GI, IR  PUD Prophylaxis : PPI   Procedures  :     IR - 11/25/21  Procedure:  1) Percutaneous, transhepatic portal venogram 2) Catheterization and venography of posterior gastric vein 3) Hepatic parenchymal track coil embolization - unsuccessful   CT -   VASCULAR 1. No evidence of active bleeding. 2. No evidence of aneurysm, dissection or other acute vascular abnormality.   NON-VASCULAR 1. Abnormal appearance of the pancreatic tail which is now inseparable from the adjacent and abutting splenic parenchyma. There is inflammatory stranding in the region as well as slight interval enlargement of a dystrophic calcification compared to prior imaging from 2017. Further, there is new wedge-shaped infarct in the same region. Differential considerations include focal pancreatitis of the pancreatic tail with involvement of the spleen including a small splenic infarct versus pancreatic tail neoplasm with direct splenic invasion. Small volume reactive fluid in the peripancreatic soft tissues extending along the left pericolic gutter and low within the anatomic pelvis. 2. Abnormal retroperitoneal lymphadenopathy  predominantly in the right para aortic space. Differential considerations include lymphoma versus metastatic prostate or bladder cancer. 3. Similar appearance of nodular extension of the prostate gland into the right bladder trigone compared to prior imaging from 02/21/2016. If anything, findings are slightly less conspicuous than previously seen. 4. Colonic diverticular disease without CT evidence of active inflammation. 5. Bilateral nonobstructing nephrolithiasis. 6. Additional ancillary findings as above.      Disposition Plan  :    Status is: Inpatient  DVT Prophylaxis  :    SCDs Start: 11/23/21 2309    Lab Results  Component Value Date   PLT 136 (L) 11/29/2021    Diet :  Diet Order             DIET DYS 3 Room service appropriate? Yes; Fluid consistency: Thin  Diet effective now                    Inpatient Medications  Scheduled Meds:  amLODipine  10 mg Oral Q1500   divalproex  250 mg Oral Q1500   donepezil  10 mg Oral Q1500   finasteride  5 mg Oral Q1500   memantine  5 mg Oral BID   pantoprazole  40 mg Oral Daily   sodium chloride flush  3 mL Intravenous Q12H   tamsulosin  0.4 mg Oral Q1500   vitamin B-12  1,000 mcg Oral Q1500   Continuous Infusions:   PRN Meds:.  Time Spent in minutes  30   Daniel Knapp M.D on 11/29/2021 at 10:42 AM  To page go to [email or web address]   [facility] -  Office  [phone]  See all Orders from today for further details    Objective:   Vitals:   11/28/21 2000 11/29/21 0000 11/29/21 0313 11/29/21 0800  BP: 95/60 104/66 111/67 115/61  Pulse: 78 77 69 73  Resp: '13 12 15 17  '$ Temp: 98 F (36.7 C)  84 F (36.7 C) 98.1 F (36.7 C) 98 F (36.7 C)  TempSrc: Axillary Axillary Axillary Oral  SpO2: 100% 97% 98%   Weight:      Height:        Wt Readings from Last 3 Encounters:  11/23/21 88.9 kg  11/20/21 90 kg  09/30/20 90.7 kg    No intake or output data in the 24 hours ending 11/29/21 1042    Physical  Exam  Awake pleasantly confused, No new F.N deficits, Normal affect [place].AT,PERRAL Supple Neck, No JVD,   Symmetrical Chest wall movement, Good air movement bilaterally, CTAB RRR,No Gallops, Rubs or new Murmurs,  +ve B.Sounds, Abd Soft, No tenderness,   No Cyanosis, Clubbing or edema        Data Review:    CBC Recent Labs  Lab 11/25/21 0430 11/26/21 0619 11/27/21 0239 11/28/21 0206 11/29/21 0340  WBC 6.6 9.0 6.2 6.0 5.9  HGB 9.7* 9.7* 9.3* 8.9* 8.7*  HCT 31.0* 32.1* 29.7* 28.5* 27.7*  PLT 154 149* 136* 131* 136*  MCV 87.1 90.2 87.6 86.4 85.0  MCH 27.2 27.2 27.4 27.0 26.7  MCHC 31.3 30.2 31.3 31.2 31.4  RDW 18.6* 19.5* 19.3* 19.0* 19.0*  LYMPHSABS  --   --  1.0 1.0 1.0  MONOABS  --   --  0.5 0.5 0.5  EOSABS  --   --  0.1 0.1 0.1  BASOSABS  --   --  0.0 0.0 0.0    Electrolytes Recent Labs  Lab 11/23/21 0144 11/25/21 0430 11/26/21 0251  NA 144 138 137  K 3.5 3.3* 3.8  CL 111 100 107  CO2 '29 27 23  '$ GLUCOSE 107* 116* 99  BUN 21 5* 7*  CREATININE 1.15 1.07 1.07  CALCIUM 8.5* 8.8* 8.4*  AST  --  32 30  ALT  --  28 27  ALKPHOS  --  85 83  BILITOT  --  0.8 0.5  ALBUMIN  --  3.4* 3.0*  MG  --  2.2 2.1  INR  --  1.2  --      Micro Results No results found for this or any previous visit (from the past 240 hour(s)).  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 Plan for percutaneous, transhepatic portal venogram and possible variceal obliteration. EXAM: 1. Ultrasound-guided transhepatic portal vein access 2. Portal venogram 3. Selective catheterization and venography of posterior gastric vein 4. Coil embolization of hepatic parenchymal catheter track MEDICATIONS: None. ANESTHESIA/SEDATION: Moderate (conscious) sedation was  employed during this procedure. A total of Versed 3 mg and Fentanyl 100 mcg was administered intravenously. Moderate Sedation Time: 103 minutes. The patient's level of consciousness and vital signs were monitored continuously by radiology nursing throughout the procedure under my direct supervision. CONTRAST:  75 mL Omnipaque 300, intravenous FLUOROSCOPY: Radiation Exposure Index (as provided by the fluoroscopic device): 867 mGy Kerma COMPLICATIONS: None immediate. PROCEDURE: Informed written consent was obtained from the patient's sister and power of attorney, [name], after a thorough discussion of the procedural risks, benefits and alternatives. All questions were addressed. Maximal Sterile Barrier Technique was utilized including caps, mask, sterile gowns, sterile gloves, sterile drape, hand hygiene and skin antiseptic. A timeout was performed prior to the initiation of the procedure. The right mid axillary upper  abdomen was prepped and draped in standard fashion. Ultrasound evaluation demonstrated patent portal vein in the peripheral aspect of the right lobe of the liver. Subdermal Local anesthesia was provided with 1% lidocaine. Small skin nick was made. Under direct ultrasound visualization, a radical branch of the right portal vein was accessed with a 21 gauge Chiba needle. A wire was advanced to the central, main portal vein. A 6 French Accustick set was then advanced to the right portal vein and the wire was removed. Hand injection of contrast demonstrated appropriate intraluminal position. A Wholey wire was then inserted over which the 6 [place] sheath was exchanged for a 7 [place], braided vascular sheath. An angled tip, 5 French catheter was then directed to the splenic vein. Catheter was exchanged for a Omni Flush catheter and splenic venogram was performed. The splenic vein is widely patent with brisk antegrade flow. There is no significant reflux into gastric varices. The catheter was exchanged for  a C2 catheter which was used to identify an cannulate the posterior gastric vein. The catheter was exchanged for a 7 mm x 2 cm mustang balloon. The balloon was insufflated in the central aspect of the posterior gastric vein for occlusive purposes. Venogram was then performed in multiple obliquities which demonstrated mildly prominent gastric varices about the fundus with multiple draining veins, no evidence of active hemorrhage. There was neck attempt at exchanging the indwelling sheath and balloon occlusion catheter, however tortuosity of the portal vein thwarted these attempts. Therefore, given the overall patient condition, mildly dilated varices, and technical difficulties due to tortuosity, attempts at variceal bleed aeration were aborted. The indwelling catheter was removed. The sheath was retracted to the peripheral aspect the right lobe of the liver. Under direct fluoroscopic visualization, a single 6 mm Nester coil was deployed in the peripheral sheath track and hemostasis was achieved with brief manual compression. Patient tolerated the procedure well was transferred back to the floor in stable condition. 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 The posterior gastric vein is widely patent with multiple draining outflow veins, and there is no evidence of splenic vein thrombosis as initially queried. Given these findings in addition to the patient's overall condition, attempts at variceal obliteration were aborted. Ruthann Cancer, MD [facility] Radiology Electronically Signed   By: Ruthann Cancer M.D.   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 Plan for percutaneous, transhepatic portal venogram  and possible variceal obliteration. EXAM: 1. Ultrasound-guided transhepatic portal vein access 2. Portal venogram 3. Selective catheterization and venography of posterior gastric vein 4. Coil embolization of hepatic parenchymal catheter track MEDICATIONS: None. ANESTHESIA/SEDATION: Moderate (conscious) sedation was employed during this procedure. A total of Versed 3 mg and Fentanyl 100 mcg was administered intravenously. Moderate Sedation Time: 103 minutes. The patient's level of consciousness and vital signs were monitored continuously by radiology nursing throughout the procedure under my direct supervision. CONTRAST:  75 mL Omnipaque 300, intravenous FLUOROSCOPY: Radiation Exposure Index (as provided by the fluoroscopic device): 790 mGy Kerma COMPLICATIONS: None immediate. PROCEDURE: Informed written consent was obtained from the patient's sister and power of attorney, [name], after a thorough discussion of the procedural risks, benefits and alternatives. All questions were addressed. Maximal Sterile Barrier Technique was utilized including caps,  mask, sterile gowns, sterile gloves, sterile drape, hand hygiene and skin antiseptic. A timeout was performed prior to the initiation of the procedure. The right mid axillary upper abdomen was prepped and draped in standard fashion. Ultrasound evaluation demonstrated patent portal vein in the peripheral aspect of the right lobe of the liver. Subdermal Local anesthesia was provided with 1% lidocaine. Small skin nick was made. Under direct ultrasound visualization, a radical branch of the right portal vein was accessed with a 21 gauge Chiba needle. A wire was advanced to the central, main portal vein. A 6 French Accustick set was then advanced to the right portal vein and the wire was removed. Hand injection of contrast demonstrated appropriate intraluminal position. A Wholey wire was then inserted over which the 6 [place] sheath was exchanged for a 7 [place], braided  vascular sheath. An angled tip, 5 French catheter was then directed to the splenic vein. Catheter was exchanged for a Omni Flush catheter and splenic venogram was performed. The splenic vein is widely patent with brisk antegrade flow. There is no significant reflux into gastric varices. The catheter was exchanged for a C2 catheter which was used to identify an cannulate the posterior gastric vein. The catheter was exchanged for a 7 mm x 2 cm mustang balloon. The balloon was insufflated in the central aspect of the posterior gastric vein for occlusive purposes. Venogram was then performed in multiple obliquities which demonstrated mildly prominent gastric varices about the fundus with multiple draining veins, no evidence of active hemorrhage. There was neck attempt at exchanging the indwelling sheath and balloon occlusion catheter, however tortuosity of the portal vein thwarted these attempts. Therefore, given the overall patient condition, mildly dilated varices, and technical difficulties due to tortuosity, attempts at variceal bleed aeration were aborted. The indwelling catheter was removed. The sheath was retracted to the peripheral aspect the right lobe of the liver. Under direct fluoroscopic visualization, a single 6 mm Nester coil was deployed in the peripheral sheath track and hemostasis was achieved with brief manual compression. Patient tolerated the procedure well was transferred back to the floor in stable condition. 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 The posterior gastric vein is widely patent with multiple draining outflow veins, and there is no evidence of splenic vein thrombosis as initially queried. Given these findings in addition to the patient's overall condition, attempts at variceal obliteration were aborted. Ruthann Cancer, MD [facility] Radiology Electronically Signed   By: Ruthann Cancer  M.D.   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 Plan for percutaneous, transhepatic portal venogram and possible variceal obliteration. EXAM: 1. Ultrasound-guided transhepatic portal vein access 2. Portal venogram 3. Selective catheterization and venography of posterior gastric vein 4. Coil embolization of hepatic parenchymal catheter track MEDICATIONS: None. ANESTHESIA/SEDATION: Moderate (conscious) sedation was employed during this procedure. A total of Versed 3 mg and Fentanyl 100 mcg was administered intravenously. Moderate Sedation Time: 103 minutes. The patient's level of consciousness and vital signs were monitored continuously by radiology nursing throughout the procedure under my direct supervision. CONTRAST:  75 mL Omnipaque 300, intravenous FLUOROSCOPY: Radiation Exposure Index (as provided by the fluoroscopic device): 606 mGy Kerma COMPLICATIONS: None immediate. PROCEDURE: Informed written consent was obtained from the patient's sister and power  of attorney, [name], after a thorough discussion of the procedural risks, benefits and alternatives. All questions were addressed. Maximal Sterile Barrier Technique was utilized including caps, mask, sterile gowns, sterile gloves, sterile drape, hand hygiene and skin antiseptic. A timeout was performed prior to the initiation of the procedure. The right mid axillary upper abdomen was prepped and draped in standard fashion. Ultrasound evaluation demonstrated patent portal vein in the peripheral aspect of the right lobe of the liver. Subdermal Local anesthesia was provided with 1% lidocaine. Small skin nick was made. Under direct ultrasound visualization, a radical branch of the right portal vein was accessed with a 21 gauge Chiba needle.  A wire was advanced to the central, main portal vein. A 6 French Accustick set was then advanced to the right portal vein and the wire was removed. Hand injection of contrast demonstrated appropriate intraluminal position. A Wholey wire was then inserted over which the 6 [place] sheath was exchanged for a 7 [place], braided vascular sheath. An angled tip, 5 French catheter was then directed to the splenic vein. Catheter was exchanged for a Omni Flush catheter and splenic venogram was performed. The splenic vein is widely patent with brisk antegrade flow. There is no significant reflux into gastric varices. The catheter was exchanged for a C2 catheter which was used to identify an cannulate the posterior gastric vein. The catheter was exchanged for a 7 mm x 2 cm mustang balloon. The balloon was insufflated in the central aspect of the posterior gastric vein for occlusive purposes. Venogram was then performed in multiple obliquities which demonstrated mildly prominent gastric varices about the fundus with multiple draining veins, no evidence of active hemorrhage. There was neck attempt at exchanging the indwelling sheath and balloon occlusion catheter, however tortuosity of the portal vein thwarted these attempts. Therefore, given the overall patient condition, mildly dilated varices, and technical difficulties due to tortuosity, attempts at variceal bleed aeration were aborted. The indwelling catheter was removed. The sheath was retracted to the peripheral aspect the right lobe of the liver. Under direct fluoroscopic visualization, a single 6 mm Nester coil was deployed in the peripheral sheath track and hemostasis was achieved with brief manual compression. Patient tolerated the procedure well was transferred back to the floor in stable condition. 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 The posterior gastric vein is widely patent with multiple draining outflow veins,  and there is no evidence of splenic vein thrombosis as initially queried. Given these findings in addition to the patient's overall condition, attempts at variceal obliteration were aborted. Ruthann Cancer, MD [facility] Radiology Electronically Signed   By: Ruthann Cancer M.D.   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 Plan for percutaneous, transhepatic portal venogram and possible variceal obliteration. EXAM: 1. Ultrasound-guided transhepatic portal vein access 2. Portal venogram 3. Selective catheterization and venography of posterior gastric vein 4. Coil embolization of hepatic parenchymal catheter track MEDICATIONS: None. ANESTHESIA/SEDATION: Moderate (conscious) sedation was employed during this procedure. A total of Versed 3 mg and Fentanyl 100 mcg was administered intravenously. Moderate Sedation Time: 103 minutes. The patient's level of consciousness and vital signs were monitored continuously by radiology nursing throughout the procedure under my direct supervision. CONTRAST:  75 mL Omnipaque 300, intravenous  FLUOROSCOPY: Radiation Exposure Index (as provided by the fluoroscopic device): 502 mGy Kerma COMPLICATIONS: None immediate. PROCEDURE: Informed written consent was obtained from the patient's sister and power of attorney, [name], after a thorough discussion of the procedural risks, benefits and alternatives. All questions were addressed. Maximal Sterile Barrier Technique was utilized including caps, mask, sterile gowns, sterile gloves, sterile drape, hand hygiene and skin antiseptic. A timeout was performed prior to the initiation of the procedure. The right mid axillary upper abdomen was prepped and draped in  standard fashion. Ultrasound evaluation demonstrated patent portal vein in the peripheral aspect of the right lobe of the liver. Subdermal Local anesthesia was provided with 1% lidocaine. Small skin nick was made. Under direct ultrasound visualization, a radical branch of the right portal vein was accessed with a 21 gauge Chiba needle. A wire was advanced to the central, main portal vein. A 6 French Accustick set was then advanced to the right portal vein and the wire was removed. Hand injection of contrast demonstrated appropriate intraluminal position. A Wholey wire was then inserted over which the 6 [place] sheath was exchanged for a 7 [place], braided vascular sheath. An angled tip, 5 French catheter was then directed to the splenic vein. Catheter was exchanged for a Omni Flush catheter and splenic venogram was performed. The splenic vein is widely patent with brisk antegrade flow. There is no significant reflux into gastric varices. The catheter was exchanged for a C2 catheter which was used to identify an cannulate the posterior gastric vein. The catheter was exchanged for a 7 mm x 2 cm mustang balloon. The balloon was insufflated in the central aspect of the posterior gastric vein for occlusive purposes. Venogram was then performed in multiple obliquities which demonstrated mildly prominent gastric varices about the fundus with multiple draining veins, no evidence of active hemorrhage. There was neck attempt at exchanging the indwelling sheath and balloon occlusion catheter, however tortuosity of the portal vein thwarted these attempts. Therefore, given the overall patient condition, mildly dilated varices, and technical difficulties due to tortuosity, attempts at variceal bleed aeration were aborted. The indwelling catheter was removed. The sheath was retracted to the peripheral aspect the right lobe of the liver. Under direct fluoroscopic visualization, a single 6 mm Nester coil was deployed in the peripheral  sheath track and hemostasis was achieved with brief manual compression. Patient tolerated the procedure well was transferred back to the floor in stable condition. 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 The posterior gastric vein is widely patent with multiple draining outflow veins, and there is no evidence of splenic vein thrombosis as initially queried. Given these findings in addition to the patient's overall condition, attempts at variceal obliteration were aborted. Ruthann Cancer, MD Vascular [facility] [facility] Radiology Electronically Signed   By: Ruthann Cancer M.D.   On: 11/25/2021 16:38

## 2021-11-30 DIAGNOSIS — F0280 Dementia in other diseases classified elsewhere without behavioral disturbance: Secondary | ICD-10-CM | POA: Diagnosis not present

## 2021-11-30 DIAGNOSIS — B1920 Unspecified viral hepatitis C without hepatic coma: Secondary | ICD-10-CM | POA: Diagnosis not present

## 2021-11-30 DIAGNOSIS — I864 Gastric varices: Secondary | ICD-10-CM | POA: Diagnosis not present

## 2021-11-30 DIAGNOSIS — I10 Essential (primary) hypertension: Secondary | ICD-10-CM | POA: Diagnosis not present

## 2021-11-30 DIAGNOSIS — Z8719 Personal history of other diseases of the digestive system: Secondary | ICD-10-CM | POA: Diagnosis not present

## 2021-11-30 DIAGNOSIS — D509 Iron deficiency anemia, unspecified: Secondary | ICD-10-CM | POA: Diagnosis not present

## 2021-11-30 DIAGNOSIS — K7460 Unspecified cirrhosis of liver: Secondary | ICD-10-CM | POA: Diagnosis not present

## 2021-11-30 DIAGNOSIS — G309 Alzheimer's disease, unspecified: Secondary | ICD-10-CM | POA: Diagnosis not present

## 2021-12-04 DIAGNOSIS — F0280 Dementia in other diseases classified elsewhere without behavioral disturbance: Secondary | ICD-10-CM | POA: Diagnosis not present

## 2021-12-04 DIAGNOSIS — D500 Iron deficiency anemia secondary to blood loss (chronic): Secondary | ICD-10-CM | POA: Diagnosis not present

## 2021-12-04 DIAGNOSIS — I10 Essential (primary) hypertension: Secondary | ICD-10-CM | POA: Diagnosis not present

## 2021-12-04 DIAGNOSIS — I864 Gastric varices: Secondary | ICD-10-CM | POA: Diagnosis not present

## 2021-12-04 DIAGNOSIS — K922 Gastrointestinal hemorrhage, unspecified: Secondary | ICD-10-CM | POA: Diagnosis not present

## 2021-12-04 DIAGNOSIS — K7460 Unspecified cirrhosis of liver: Secondary | ICD-10-CM | POA: Diagnosis not present

## 2021-12-04 DIAGNOSIS — B1920 Unspecified viral hepatitis C without hepatic coma: Secondary | ICD-10-CM | POA: Diagnosis not present

## 2021-12-11 ENCOUNTER — Inpatient Hospital Stay
Admission: EM | Admit: 2021-12-11 | Discharge: 2021-12-18 | DRG: 693 | Disposition: A | Discharge status: Skilled Nursing Facility | Payer: Medicare HMO | Attending: Internal Medicine | Admitting: Internal Medicine

## 2021-12-11 ENCOUNTER — Other Ambulatory Visit: Payer: Self-pay

## 2021-12-11 ENCOUNTER — Encounter: Payer: Self-pay | Admitting: Intensive Care

## 2021-12-11 ENCOUNTER — Emergency Department: Payer: Medicare HMO

## 2021-12-11 DIAGNOSIS — K922 Gastrointestinal hemorrhage, unspecified: Secondary | ICD-10-CM | POA: Diagnosis not present

## 2021-12-11 DIAGNOSIS — Z66 Do not resuscitate: Secondary | ICD-10-CM | POA: Diagnosis not present

## 2021-12-11 DIAGNOSIS — M6281 Muscle weakness (generalized): Secondary | ICD-10-CM | POA: Diagnosis not present

## 2021-12-11 DIAGNOSIS — F0390 Unspecified dementia without behavioral disturbance: Secondary | ICD-10-CM | POA: Diagnosis not present

## 2021-12-11 DIAGNOSIS — N3289 Other specified disorders of bladder: Secondary | ICD-10-CM

## 2021-12-11 DIAGNOSIS — Z6824 Body mass index (BMI) 24.0-24.9, adult: Secondary | ICD-10-CM | POA: Diagnosis not present

## 2021-12-11 DIAGNOSIS — N179 Acute kidney failure, unspecified: Secondary | ICD-10-CM

## 2021-12-11 DIAGNOSIS — B1920 Unspecified viral hepatitis C without hepatic coma: Secondary | ICD-10-CM | POA: Diagnosis present

## 2021-12-11 DIAGNOSIS — R5381 Other malaise: Secondary | ICD-10-CM | POA: Diagnosis not present

## 2021-12-11 DIAGNOSIS — Z8249 Family history of ischemic heart disease and other diseases of the circulatory system: Secondary | ICD-10-CM

## 2021-12-11 DIAGNOSIS — Z9079 Acquired absence of other genital organ(s): Secondary | ICD-10-CM

## 2021-12-11 DIAGNOSIS — N400 Enlarged prostate without lower urinary tract symptoms: Secondary | ICD-10-CM | POA: Diagnosis not present

## 2021-12-11 DIAGNOSIS — F02818 Dementia in other diseases classified elsewhere, unspecified severity, with other behavioral disturbance: Secondary | ICD-10-CM | POA: Diagnosis not present

## 2021-12-11 DIAGNOSIS — R1310 Dysphagia, unspecified: Secondary | ICD-10-CM | POA: Diagnosis not present

## 2021-12-11 DIAGNOSIS — G9341 Metabolic encephalopathy: Secondary | ICD-10-CM | POA: Diagnosis not present

## 2021-12-11 DIAGNOSIS — K7469 Other cirrhosis of liver: Secondary | ICD-10-CM | POA: Diagnosis not present

## 2021-12-11 DIAGNOSIS — Z7189 Other specified counseling: Secondary | ICD-10-CM | POA: Diagnosis not present

## 2021-12-11 DIAGNOSIS — R0902 Hypoxemia: Secondary | ICD-10-CM | POA: Diagnosis not present

## 2021-12-11 DIAGNOSIS — N200 Calculus of kidney: Secondary | ICD-10-CM | POA: Diagnosis present

## 2021-12-11 DIAGNOSIS — R590 Localized enlarged lymph nodes: Secondary | ICD-10-CM | POA: Diagnosis present

## 2021-12-11 DIAGNOSIS — I864 Gastric varices: Secondary | ICD-10-CM | POA: Diagnosis present

## 2021-12-11 DIAGNOSIS — C61 Malignant neoplasm of prostate: Secondary | ICD-10-CM | POA: Diagnosis present

## 2021-12-11 DIAGNOSIS — N132 Hydronephrosis with renal and ureteral calculous obstruction: Secondary | ICD-10-CM | POA: Diagnosis not present

## 2021-12-11 DIAGNOSIS — F03C0 Unspecified dementia, severe, without behavioral disturbance, psychotic disturbance, mood disturbance, and anxiety: Secondary | ICD-10-CM | POA: Diagnosis present

## 2021-12-11 DIAGNOSIS — Z79899 Other long term (current) drug therapy: Secondary | ICD-10-CM | POA: Diagnosis not present

## 2021-12-11 DIAGNOSIS — R627 Adult failure to thrive: Secondary | ICD-10-CM | POA: Diagnosis present

## 2021-12-11 DIAGNOSIS — I10 Essential (primary) hypertension: Secondary | ICD-10-CM | POA: Diagnosis present

## 2021-12-11 DIAGNOSIS — J9811 Atelectasis: Secondary | ICD-10-CM | POA: Diagnosis not present

## 2021-12-11 DIAGNOSIS — D494 Neoplasm of unspecified behavior of bladder: Secondary | ICD-10-CM | POA: Diagnosis present

## 2021-12-11 DIAGNOSIS — R4182 Altered mental status, unspecified: Principal | ICD-10-CM

## 2021-12-11 DIAGNOSIS — N281 Cyst of kidney, acquired: Secondary | ICD-10-CM | POA: Diagnosis not present

## 2021-12-11 DIAGNOSIS — E876 Hypokalemia: Secondary | ICD-10-CM | POA: Diagnosis not present

## 2021-12-11 DIAGNOSIS — Z7401 Bed confinement status: Secondary | ICD-10-CM | POA: Diagnosis not present

## 2021-12-11 DIAGNOSIS — Z515 Encounter for palliative care: Secondary | ICD-10-CM

## 2021-12-11 DIAGNOSIS — G309 Alzheimer's disease, unspecified: Secondary | ICD-10-CM | POA: Diagnosis not present

## 2021-12-11 DIAGNOSIS — K7460 Unspecified cirrhosis of liver: Secondary | ICD-10-CM | POA: Diagnosis present

## 2021-12-11 DIAGNOSIS — N201 Calculus of ureter: Secondary | ICD-10-CM | POA: Diagnosis not present

## 2021-12-11 DIAGNOSIS — G9340 Encephalopathy, unspecified: Secondary | ICD-10-CM | POA: Diagnosis not present

## 2021-12-11 DIAGNOSIS — K5730 Diverticulosis of large intestine without perforation or abscess without bleeding: Secondary | ICD-10-CM | POA: Diagnosis not present

## 2021-12-11 DIAGNOSIS — R58 Hemorrhage, not elsewhere classified: Secondary | ICD-10-CM | POA: Diagnosis not present

## 2021-12-11 LAB — COMPREHENSIVE METABOLIC PANEL
ALT: 32 U/L (ref 0–44)
AST: 54 U/L — ABNORMAL HIGH (ref 15–41)
Albumin: 3.7 g/dL (ref 3.5–5.0)
Alkaline Phosphatase: 104 U/L (ref 38–126)
Anion gap: 11 (ref 5–15)
BUN: 11 mg/dL (ref 8–23)
CO2: 21 mmol/L — ABNORMAL LOW (ref 22–32)
Calcium: 9.3 mg/dL (ref 8.9–10.3)
Chloride: 104 mmol/L (ref 98–111)
Creatinine, Ser: 1.43 mg/dL — ABNORMAL HIGH (ref 0.61–1.24)
GFR, Estimated: 52 mL/min — ABNORMAL LOW (ref >60)
Glucose, Bld: 96 mg/dL (ref 70–99)
Potassium: 3.7 mmol/L (ref 3.5–5.1)
Sodium: 136 mmol/L (ref 135–145)
Total Bilirubin: 0.7 mg/dL (ref 0.3–1.2)
Total Protein: 9 g/dL — ABNORMAL HIGH (ref 6.5–8.1)

## 2021-12-11 LAB — CBC WITH DIFFERENTIAL/PLATELET
Abs Immature Granulocytes: 0.03 10*3/uL (ref 0.00–0.07)
Basophils Absolute: 0.1 10*3/uL (ref 0.0–0.1)
Basophils Relative: 1 %
Eosinophils Absolute: 0.2 10*3/uL (ref 0.0–0.5)
Eosinophils Relative: 2 %
HCT: 35.2 % — ABNORMAL LOW (ref 39.0–52.0)
Hemoglobin: 10.1 g/dL — ABNORMAL LOW (ref 13.0–17.0)
Immature Granulocytes: 0 %
Lymphocytes Relative: 15 %
Lymphs Abs: 1.2 10*3/uL (ref 0.7–4.0)
MCH: 25 pg — ABNORMAL LOW (ref 26.0–34.0)
MCHC: 28.7 g/dL — ABNORMAL LOW (ref 30.0–36.0)
MCV: 87.1 fL (ref 80.0–100.0)
Monocytes Absolute: 0.7 10*3/uL (ref 0.1–1.0)
Monocytes Relative: 8 %
Neutro Abs: 5.9 10*3/uL (ref 1.7–7.7)
Neutrophils Relative %: 74 %
Platelets: 251 10*3/uL (ref 150–400)
RBC: 4.04 MIL/uL — ABNORMAL LOW (ref 4.22–5.81)
RDW: 18.3 % — ABNORMAL HIGH (ref 11.5–15.5)
WBC: 8 10*3/uL (ref 4.0–10.5)
nRBC: 0 % (ref 0.0–0.2)

## 2021-12-11 LAB — AMMONIA: Ammonia: 14 umol/L (ref 9–35)

## 2021-12-11 MED ORDER — ONDANSETRON HCL 4 MG/2ML IJ SOLN: 4.0000 mg | Freq: Four times a day (QID) | INTRAMUSCULAR | Status: DC | PRN

## 2021-12-11 MED ORDER — AMLODIPINE BESYLATE 10 MG PO TABS
10.0000 mg | ORAL_TABLET | Freq: Every day | ORAL | Status: DC
  Administered 2021-12-11 – 2021-12-13 (×3): 10 mg via ORAL
  Filled 2021-12-11 (×3): qty 1
  Filled 2021-12-11: qty 2

## 2021-12-11 MED ORDER — ACETAMINOPHEN 650 MG RE SUPP: 650.0000 mg | Freq: Four times a day (QID) | RECTAL | Status: DC | PRN

## 2021-12-11 MED ORDER — SODIUM CHLORIDE 0.9 % IV SOLN: INTRAVENOUS | Status: AC

## 2021-12-11 MED ORDER — TAMSULOSIN HCL 0.4 MG PO CAPS
0.4000 mg | ORAL_CAPSULE | Freq: Every day | ORAL | Status: DC
  Filled 2021-12-11: qty 1

## 2021-12-11 MED ORDER — MORPHINE SULFATE (PF) 2 MG/ML IV SOLN: 2.0000 mg | INTRAVENOUS | Status: DC | PRN

## 2021-12-11 MED ORDER — ACETAMINOPHEN 325 MG PO TABS
650.0000 mg | ORAL_TABLET | Freq: Four times a day (QID) | ORAL | Status: DC | PRN
  Filled 2021-12-11: qty 2

## 2021-12-11 MED ORDER — DIVALPROEX SODIUM ER 250 MG PO TB24
250.0000 mg | ORAL_TABLET | Freq: Every day | ORAL | Status: DC
  Administered 2021-12-12: 250 mg via ORAL
  Filled 2021-12-11 (×2): qty 1

## 2021-12-11 MED ORDER — FINASTERIDE 5 MG PO TABS
5.0000 mg | ORAL_TABLET | Freq: Every day | ORAL | Status: DC
  Administered 2021-12-12 – 2021-12-17 (×6): 5 mg via ORAL
  Filled 2021-12-11 (×8): qty 1

## 2021-12-11 MED ORDER — ADULT MULTIVITAMIN W/MINERALS CH
1.0000 | ORAL_TABLET | Freq: Every day | ORAL | Status: DC
  Administered 2021-12-12 – 2021-12-17 (×6): 1 via ORAL
  Filled 2021-12-11 (×6): qty 1

## 2021-12-11 MED ORDER — ATENOLOL 25 MG PO TABS
25.0000 mg | ORAL_TABLET | Freq: Every day | ORAL | Status: DC
  Administered 2021-12-12 – 2021-12-18 (×6): 25 mg via ORAL
  Filled 2021-12-11 (×7): qty 1

## 2021-12-11 MED ORDER — PANTOPRAZOLE SODIUM 40 MG PO TBEC
40.0000 mg | DELAYED_RELEASE_TABLET | Freq: Two times a day (BID) | ORAL | Status: DC
  Administered 2021-12-12 (×2): 40 mg via ORAL
  Filled 2021-12-11 (×2): qty 1

## 2021-12-11 MED ORDER — SODIUM CHLORIDE 0.9 % IV BOLUS
1000.0000 mL | Freq: Once | INTRAVENOUS | Status: AC
  Administered 2021-12-11: 1000 mL via INTRAVENOUS

## 2021-12-11 MED ORDER — ONDANSETRON HCL 4 MG PO TABS: 4.0000 mg | ORAL_TABLET | Freq: Four times a day (QID) | ORAL | Status: DC | PRN

## 2021-12-11 MED ORDER — IOHEXOL 300 MG/ML  SOLN
100.0000 mL | Freq: Once | INTRAMUSCULAR | Status: AC | PRN
  Administered 2021-12-11: 100 mL via INTRAVENOUS

## 2021-12-11 MED ORDER — DONEPEZIL HCL 5 MG PO TABS
10.0000 mg | ORAL_TABLET | Freq: Every day | ORAL | Status: DC
  Administered 2021-12-12 – 2021-12-17 (×6): 10 mg via ORAL
  Filled 2021-12-11 (×6): qty 2

## 2021-12-11 MED ORDER — VITAMIN D 25 MCG (1000 UNIT) PO TABS
1000.0000 [IU] | ORAL_TABLET | Freq: Every day | ORAL | Status: DC
  Administered 2021-12-12 – 2021-12-18 (×7): 1000 [IU] via ORAL
  Filled 2021-12-11 (×7): qty 1

## 2021-12-11 MED ORDER — MEMANTINE HCL 5 MG PO TABS
5.0000 mg | ORAL_TABLET | Freq: Two times a day (BID) | ORAL | Status: DC
  Administered 2021-12-11 – 2021-12-18 (×14): 5 mg via ORAL
  Filled 2021-12-11 (×15): qty 1

## 2021-12-11 NOTE — H&P (Signed)
History and Physical    Patient: Daniel Knapp DOB: [birth year] DOA: 12/11/2021 DOS: the patient was seen and examined on 12/11/2021 PCP: Derinda Late, MD  Patient coming from: SNF  Chief Complaint:  Chief Complaint  Patient presents with   low sats    HPI: Daniel Knapp is a 71 y.o. male with medical history significant for Dementia, untreated hepatitis C, cirrhosis, hypertension, recently hospitalized from 6/15 to 6/19 with acute blood loss anemia secondary to bleeding gastric varix, requiring 4 units PRBCs for hemoglobin 3.7, undergoing unsuccessful hepatic parenchymal tract coil embolization by IR on 6/17 who was brought to the ED by EMS for evaluation of low oxygen readings at the facility and altered mental status described as uncooperativeness.  Unable to obtain history from patient due to dementia. ED course and data review: Afebrile, pulse 68, respirations 20, BP 122/73 with O2 sat 98% on room air Labs: WBC 8, hemoglobin 10.1, up from 8.7 at discharge on 11/27/2021.  Creatinine 1.43 up from baseline of 1.07.  LFTs WNL.  Ammonia normal at 14. Urinalysis not yet obtained. Imaging: CT head nonacute CT abdomen and pelvis with the following findings: IMPRESSION: 1. 4 mm obstructing right ureteral calculus. 2. 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 Correlation with cystoscopy is recommended. 3. Sigmoid diverticulosis. 4. Postprocedural changes suspected within the right lobe of the liver.  Chest x-ray clear The ED provider: Spoke with urologist, Dr. Caprice Beaver who recommended keeping patient n.p.o. in case of procedure in the a.m. Urinalysis in the process of being obtained.   Hospitalist consulted for admission.    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-has seen Dr Delaine Lame   Prostate cancer [facility]) 02/2016   Prostate enlargement    Urinary catheter insertion/adjustment/removal    Urinary retention    UTI (lower urinary tract infection)    UTI symptoms    Weight loss    Past Surgical History:  Procedure Laterality Date   ESOPHAGOGASTRODUODENOSCOPY N/A 11/21/2021   Procedure: ESOPHAGOGASTRODUODENOSCOPY (EGD);  Surgeon: Annamaria Helling, DO;  Location: [facility] ENDOSCOPY;  Service: Gastroenterology;  Laterality: N/A;   IR ANGIOGRAM SELECTIVE EACH ADDITIONAL VESSEL  11/25/2021   IR EMBO ART  VEN HEMORR LYMPH EXTRAV  INC GUIDE ROADMAPPING  11/25/2021   IR TRANSHEPATIC PORTOGRAM WO HEMO  11/25/2021   IR US GUIDE VASC ACCESS RIGHT  11/25/2021   TRANSURETHRAL RESECTION OF BLADDER TUMOR N/A 08/17/2015   Procedure: TRANSURETHRAL RESECTION OF BLADDER TUMOR (TURBT);  Surgeon: [name], MD;  Location: [facility] ORS;  Service: Urology;  Laterality: N/A;   TRANSURETHRAL RESECTION OF PROSTATE N/A 08/17/2015   Procedure: TRANSURETHRAL RESECTION OF THE PROSTATE (TURP);  Surgeon: [name], MD;  Location: [facility] ORS;  Service: Urology;  Laterality: N/A;   XI ROBOTIC ASSISTED VENTRAL HERNIA N/A 09/30/2020   Procedure: XI ROBOTIC ASSISTED VENTRAL HERNIA;  Surgeon: Herbert Pun, MD;  Location: [facility] ORS;  Service: General;  Laterality: N/A;   Social History:  reports that he has never smoked. He has never used smokeless tobacco. He reports that he does not currently use alcohol. He reports that he does not currently use drugs after having used the  following drugs: Marijuana.  No Known Allergies  Family History  Problem Relation Age of Onset   Heart disease Mother    Cancer Father    Kidney disease Neg Hx    Prostate cancer Neg Hx     Prior to Admission medications   Medication Sig Start Date End Date Taking? Authorizing Provider  amLODipine (NORVASC) 10 MG tablet  Take 10 mg by mouth daily in the afternoon.   Yes [provider]  atenolol (TENORMIN) 25 MG tablet Take 25 mg by mouth daily. 10/06/21  Yes [provider]  cholecalciferol (VITAMIN D) 25 MCG (1000 UNIT) tablet Take 1,000 Units by mouth daily.   Yes [provider]  divalproex (DEPAKOTE ER) 250 MG 24 hr tablet Take 250 mg by mouth daily in the afternoon. 07/25/20  Yes [provider]  donepezil (ARICEPT) 10 MG tablet Take 10 mg by mouth daily in the afternoon. 07/09/20  Yes [provider]  finasteride (PROSCAR) 5 MG tablet Take 5 mg by mouth daily in the afternoon. Reported on 08/17/2015   Yes [provider]  memantine (NAMENDA) 5 MG tablet Take 5 mg by mouth 2 (two) times daily. 08/14/20  Yes [provider]  Multiple Vitamin (MULTIVITAMIN WITH MINERALS) TABS tablet Take 1 tablet by mouth daily in the afternoon.   Yes [provider]  pantoprazole (PROTONIX) 40 MG tablet Take 1 tablet (40 mg total) by mouth 2 (two) times daily before a meal. 11/27/21  Yes Thurnell Lose, MD  tamsulosin (FLOMAX) 0.4 MG CAPS capsule Take 0.4 mg by mouth daily in the afternoon. Reported on 09/01/2015   Yes [provider]  vitamin B-12 (CYANOCOBALAMIN) 1000 MCG tablet Take 1,000 mcg by mouth daily in the afternoon.   Yes [provider]    Physical Exam: Vitals:   12/11/21 2000 12/11/21 2030 12/11/21 2100 12/11/21 2200  BP: (!) 143/112 129/84  112/79  Pulse: 66 63  (!) 54  Resp:    17  Temp:   98.5 F (36.9 C) 98.2 F (36.8 C)  TempSrc:   Oral Oral  SpO2: 93% 100%  100%  Weight:      Height:       Physical Exam  Labs on Admission: I have personally reviewed following labs and imaging studies  CBC: Recent Labs  Lab 12/11/21 1623  WBC 8.0  NEUTROABS 5.9  HGB 10.1*  HCT 35.2*  MCV 87.1  PLT 585   Basic Metabolic Panel: Recent Labs  Lab 12/11/21 1623  NA 136  K 3.7  CL 104  CO2 21*  GLUCOSE 96  BUN 11   CREATININE 1.43*  CALCIUM 9.3   GFR: Estimated Creatinine Clearance: 55.1 mL/min (A) (by C-G formula based on SCr of 1.43 mg/dL (H)). Liver Function Tests: Recent Labs  Lab 12/11/21 1623  AST 54*  ALT 32  ALKPHOS 104  BILITOT 0.7  PROT 9.0*  ALBUMIN 3.7   No results for input(s): "LIPASE", "AMYLASE" in the last 168 hours. Recent Labs  Lab 12/11/21 2006  AMMONIA 14   Coagulation Profile: No results for input(s): "INR", "PROTIME" in the last 168 hours. Cardiac Enzymes: No results for input(s): "CKTOTAL", "CKMB", "CKMBINDEX", "TROPONINI" in the last 168 hours. BNP (last 3 results) No results for input(s): "PROBNP" in the last 8760 hours. HbA1C: No results for input(s): "HGBA1C" in the last 72 hours. CBG: No results for input(s): "GLUCAP" in the last 168 hours. Lipid Profile: No results for input(s): "CHOL", "  HDL", "LDLCALC", "TRIG", "CHOLHDL", "LDLDIRECT" in the last 72 hours. Thyroid Function Tests: No results for input(s): "TSH", "T4TOTAL", "FREET4", "T3FREE", "THYROIDAB" in the last 72 hours. Anemia Panel: No results for input(s): "VITAMINB12", "FOLATE", "FERRITIN", "TIBC", "IRON", "RETICCTPCT" in the last 72 hours. Urine analysis:    Component Value Date/Time   COLORURINE STRAW (A) 11/20/2021 1027   APPEARANCEUR CLEAR (A) 11/20/2021 1027   APPEARANCEUR Cloudy (A) 09/01/2015 1015   LABSPEC 1.014 11/20/2021 1027   LABSPEC 1.014 09/17/2014 2237   PHURINE 6.0 11/20/2021 1027   GLUCOSEU NEGATIVE 11/20/2021 1027   GLUCOSEU Negative 09/17/2014 2237   HGBUR NEGATIVE 11/20/2021 1027   BILIRUBINUR NEGATIVE 11/20/2021 1027   BILIRUBINUR Negative 09/01/2015 1015   BILIRUBINUR Negative 09/17/2014 2237   KETONESUR NEGATIVE 11/20/2021 1027   PROTEINUR NEGATIVE 11/20/2021 1027   NITRITE NEGATIVE 11/20/2021 1027   LEUKOCYTESUR NEGATIVE 11/20/2021 1027   LEUKOCYTESUR Negative 09/17/2014 2237    Radiological Exams on Admission: CT Head Wo Contrast  Result Date:  12/11/2021 CLINICAL DATA:  Altered mental status with history of GI bleed and anemia. EXAM: CT HEAD WITHOUT CONTRAST TECHNIQUE: Contiguous axial images were obtained from the base of the skull through the vertex without intravenous contrast. RADIATION DOSE REDUCTION: This exam was performed according to the departmental dose-optimization program which includes automated exposure control, adjustment of the mA and/or kV according to patient size and/or use of iterative reconstruction technique. COMPARISON:  December 20, 2021 FINDINGS: Brain: There is moderate severity cerebral atrophy with widening of the extra-axial spaces and ventricular dilatation. There are areas of decreased attenuation within the white matter tracts of the supratentorial brain, consistent with microvascular disease changes. Vascular: No hyperdense vessel or unexpected calcification. Skull: Normal. Negative for fracture or focal lesion. Sinuses/Orbits: No acute finding. 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 IMPRESSION: 1. No acute intracranial abnormality. 2. Generalized cerebral atrophy and microvascular disease changes of the supratentorial brain. Electronically Signed   By: Virgina Norfolk M.D.   On: 12/11/2021 21:34   CT ABDOMEN PELVIS W CONTRAST  Result Date: 12/11/2021 CLINICAL DATA:  History of GI bleed with low oxygen readings at facility. EXAM: CT ABDOMEN AND PELVIS WITH CONTRAST TECHNIQUE: Multidetector CT imaging of the abdomen and pelvis was performed using the standard protocol following bolus administration of intravenous contrast. RADIATION DOSE REDUCTION: This exam was performed according to the departmental dose-optimization program which includes automated exposure control, adjustment of the mA and/or kV according to patient size and/or use of iterative reconstruction technique. CONTRAST:  156m OMNIPAQUE IOHEXOL 300 MG/ML  SOLN COMPARISON:  November 21, 2021 FINDINGS: Lower chest: Mild atelectasis is seen  within the posterior aspects of the bilateral lung bases. Hepatobiliary: Metallic density foci are seen within the posterolateral aspect of the right lobe of the liver. Associated streak artifact is seen. This represents a new finding when compared to the prior study. The gallbladder is normal in appearance, without evidence of gallstones, gallbladder wall thickening or pericholecystic inflammation. No biliary dilatation is seen Pancreas: Unremarkable. No pancreatic ductal dilatation or surrounding inflammatory changes. Spleen: A predominant stable, 4.0 cm x 3.7 cm area of parenchymal low attenuation is seen within the anterior aspect of the spleen. Adrenals/Urinary Tract: Adrenal glands are unremarkable. Kidneys are normal in size. A stable subcentimeter simple cyst is seen within the posterior aspect of the upper pole of the left kidney. A 4 mm obstructing renal calculus is seen within the proximal right ureter. Mild right-sided hydronephrosis and hydroureter are seen.  A 4.1 cm x 3.0 cm x 2.7 cm area of heterogeneous soft tissue attenuation is seen within the anterolateral aspect of the base of the urinary bladder, to the right of midline. Stomach/Bowel: Stomach is within normal limits. Appendix appears normal. No evidence of bowel wall thickening, distention, or inflammatory changes. Noninflamed diverticula are seen throughout the sigmoid colon. Vascular/Lymphatic: Aortic atherosclerosis. There is mild, stable aortocaval lymphadenopathy. Reproductive: The prostate gland is surgically absent. Other: No abdominal wall hernia or abnormality. No abdominopelvic ascites. Musculoskeletal: Multilevel degenerative changes seen throughout the lumbar spine. IMPRESSION: 1. 4 mm obstructing right ureteral calculus. 2. 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 Correlation with cystoscopy is recommended. 3. Sigmoid diverticulosis. 4. Postprocedural  changes suspected within the right lobe of the liver. Aortic Atherosclerosis (ICD10-I70.0). Electronically Signed   By: Virgina Norfolk M.D.   On: 12/11/2021 21:32   DG Chest Portable 1 View  Result Date: 12/11/2021 CLINICAL DATA:  Weakness and altered mental status. EXAM: PORTABLE CHEST 1 VIEW COMPARISON:  09/16/2014 FINDINGS: Low lung volumes. Mild bibasilar atelectasis. No confluent consolidation. No pulmonary edema, pleural effusion, or pneumothorax. No acute osseous abnormalities are seen. IMPRESSION: Low lung volumes with mild bibasilar atelectasis. Electronically Signed   By: Keith Rake M.D.   On: 12/11/2021 20:36     Data Reviewed: Relevant notes from primary care and specialist visits, past discharge summaries as available in EHR, including Care Everywhere. Prior diagnostic testing as pertinent to current admission diagnoses Updated medications and problem lists for reconciliation ED course, including vitals, labs, imaging, treatment and response to treatment Triage notes, nursing and pharmacy notes and ED provider's notes Notable results as noted in HPI   Assessment and Plan: * Acute metabolic encephalopathy Unclear etiology Follow urinalysis to evaluate for infection Given obstructing ureteral calculus could be related to pain Pain control and delirium precautions  AKI (acute kidney injury) ([place]) Possibly obstructive IV hydration and monitor Continue Flomax  Hydronephrosis with urinary obstruction due to right ureteral calculus Patient does not appear to be in acute pain Continue Flomax Follow-up UA to evaluate for UTI Urology consulted Will leave n.p.o. tonight  Bladder mass As seen on CT abdomen and pelvis on admission Urology consulted with finding  HTN (hypertension) Resume home amlodipine and atenolol  Dementia ([place]) Continue Aricept, Namenda and Depakote ER  Cirrhosis ([place]) Secondary to history of untreated hepatitis C  Gastric varix s/p bleed  11/20/21 History of variceal bleed on 6/12 with hemoglobin of 3.7 requiring 4 units of packed RBC No evidence of bleeding at this time Continue to monitor hemoglobin  Prostate cancer ([place]) No acute disused suspected        DVT prophylaxis: Lovenox***  Consults: none***  Advance Care Planning:   Code Status: Prior ***  Family Communication: none***  Disposition Plan: Back to previous home environment  Severity of Illness: {Observation/Inpatient:21159}  Author: Athena Masse, MD 12/11/2021 11:02 PM  For on call review [email or web address].

## 2021-12-11 NOTE — ED Notes (Signed)
Pt family aware of need for ua

## 2021-12-11 NOTE — Assessment & Plan Note (Addendum)
As seen on CT abdomen and pelvis on admission Urology consulted with finding

## 2021-12-11 NOTE — Assessment & Plan Note (Signed)
Secondary to history of untreated hepatitis C

## 2021-12-11 NOTE — ED Provider Notes (Signed)
[facility] Provider Note    Event Date/Time   First MD Initiated Contact with Patient 12/11/21 1947     (approximate)   History   low sats   HPI  Daniel Knapp is a 71 y.o. male who comes from his nursing facility for low sats.  Reportedly his extremities were cold and they could not get a good oxygen saturation on him.  His sister is here with him though and reports that he is not acting like himself.  She saw him yesterday I believe and he was awake alert and talking right now he is just kind of staring off into space and playing with his pulse ox.  She said the last time he was like that he had had a GI bleed.  He was in the hospital here and got 4 planes transfused.  He then went to [facility].  Medical record bears this out.      Physical Exam   Triage Vital Signs: ED Triage Vitals [12/11/21 1625]  Enc Vitals Group     BP 122/73     Pulse Rate 68     Resp 20     Temp      Temp src      SpO2 98 %     Weight 195 lb 15.8 oz (88.9 kg)     Height '6\' 2"'$  (1.88 m)     Head Circumference      Peak Flow      Pain Score 0     Pain Loc      Pain Edu?      Excl. in [place]?     Most recent vital signs: Vitals:   12/11/21 2030 12/11/21 2100  BP: 129/84   Pulse: 63   Resp:    Temp:  98.5 F (36.9 C)  SpO2: 100%      General: Awake, no distress.  Mental status described above CV:  Good peripheral perfusion.  Heart regular rate and rhythm no audible murmurs Resp:  Normal effort.  Lungs are clear although the patient is not taking any deep breath Abd:  No distention.  Soft and nontender Extremities: No edema   ED Results / Procedures / Treatments   Labs (all labs ordered are listed, but only abnormal results are displayed) Labs Reviewed  CBC WITH DIFFERENTIAL/PLATELET - Abnormal; Notable for the following components:      Result Value   RBC 4.04 (*)    Hemoglobin 10.1 (*)    HCT 35.2 (*)    MCH 25.0 (*)    MCHC 28.7 (*)    RDW 18.3 (*)     All other components within normal limits  COMPREHENSIVE METABOLIC PANEL - Abnormal; Notable for the following components:   CO2 21 (*)    Creatinine, Ser 1.43 (*)    Total Protein 9.0 (*)    AST 54 (*)    GFR, Estimated 52 (*)    All other components within normal limits  AMMONIA  PROTIME-INR  URINALYSIS, ROUTINE W REFLEX MICROSCOPIC  TYPE AND SCREEN     EKG     RADIOLOGY Chest x-ray reviewed and interpreted by me appears to show some increased density in the left hilum.  I will wait to see what radiology thinks of this. CT scan of the abdomen shows a obstructing 4 mm renal stone and a new bladder mass. CT of the head shows atrophy only.  I reviewed both of these films and interpreted them  to. PROCEDURES:  Critical Care performed:   Procedures   MEDICATIONS ORDERED IN ED: Medications  sodium chloride 0.9 % bolus 1,000 mL (1,000 mLs Intravenous New Bag/Given 12/11/21 2051)  iohexol (OMNIPAQUE) 300 MG/ML solution 100 mL (100 mLs Intravenous Contrast Given 12/11/21 2114)     IMPRESSION / MDM / ASSESSMENT AND PLAN / ED COURSE  I reviewed the triage vital signs and the nursing notes. I reviewed the CT findings in detail with the patient's sister.  Also informed her that I do not see any sign of hemorrhage at this point.  She is very worried about the patient's altered mental status.  I agree this is very unusual.  I think it would be safest if we kept him in the hospital especially since I have been unable to get a urine at this point.  I cannot just catheterize him as he is very stubborn and resistant and difficult to control if he gets going.  Do not want to get nurse to get injured.    Patient's presentation is most consistent with acute complicated illness / injury requiring diagnostic workup. The patient is on the cardiac monitor to evaluate for evidence of arrhythmia and/or significant heart rate changes.  None have been seen.    FINAL CLINICAL IMPRESSION(S) / ED  DIAGNOSES   Final diagnoses:  Altered mental status, unspecified altered mental status type  Kidney stone  Bladder tumor     Rx / DC Orders   ED Discharge Orders     None        Note:  This document was prepared using Dragon voice recognition software and may include unintentional dictation errors.   Nena Polio, MD 12/11/21 2154

## 2021-12-11 NOTE — ED Provider Triage Note (Signed)
Emergency Medicine Provider Triage Evaluation Note  Daniel Knapp , a 71 y.o. male  was evaluated in triage.  Pt complains of "possible GI bleed." EMS reports that his extremities are cold and unable to get O2 read. Coming from [facility], and he was there for less than 1 week. H/o dementia. Unknown if blood in stool. Per EMS, at his mental baseline though non-contributory to history and exam. O2 sat 98% here.  Recent admission for UGIB, EGD reveals gastric varix, s/p 4 units pRBCs, unsuccessful hepatic embolization with IR. Cirrhosis thought to be secondary to untreated hep C.  Patient Active Problem List   Diagnosis Date Noted   Abnormal CT scan, gastrointestinal tract    Iron deficiency anemia 11/23/2021   Gastric varix 11/23/2021   Cirrhosis ([place]) 11/23/2021   Upper GI bleed 11/23/2021   Symptomatic anemia 11/20/2021   HTN (hypertension)    Dementia (HCC)    Hepatitis    Hypokalemia    Fall    Prostate cancer ([place]) 01/23/2016   BPH (benign prostatic hyperplasia) 08/17/2015   Urinary retention 11/10/2014   BPH (benign prostatic hypertrophy) with urinary retention 11/10/2014   .  Review of Systems  Positive: Cold extremities Negative: Abdominal pain  Physical Exam  There were no vitals taken for this visit. Gen:   Awake, no distress   Resp:  Normal effort  MSK:   Moves extremities without difficulty  Other:  Normal pulses, nods and shakes head to questions  Medical Decision Making  Medically screening exam initiated at 4:19 PM.  Appropriate orders placed.  [name] was informed that the remainder of the evaluation will be completed by another provider, this initial triage assessment does not replace that evaluation, and the importance of remaining in the ED until their evaluation is complete.     Marquette Old, PA-C 12/11/21 1636

## 2021-12-11 NOTE — Assessment & Plan Note (Addendum)
History of variceal bleed on 6/12 with hemoglobin of 3.7 requiring 4 units of packed RBC No evidence of bleeding at this time Continue to monitor hemoglobin

## 2021-12-11 NOTE — ED Triage Notes (Signed)
Patient arrived by EMS from [facility]. Staff called EMS due to low oxygen readings at facility. History GI bleeds and anemia. Has advanced dementia and uncooperative

## 2021-12-11 NOTE — ED Notes (Signed)
Spoke with blood bank and lab about the label printers not working. Sent pt chart labels

## 2021-12-11 NOTE — Assessment & Plan Note (Signed)
Possibly obstructive IV hydration and monitor Continue Flomax

## 2021-12-11 NOTE — Assessment & Plan Note (Signed)
Resume home amlodipine and atenolol

## 2021-12-11 NOTE — Assessment & Plan Note (Signed)
Unclear etiology Follow urinalysis to evaluate for infection Given obstructing ureteral calculus could be related to pain Pain control and delirium precautions

## 2021-12-11 NOTE — Assessment & Plan Note (Signed)
Continue Aricept, Namenda and Depakote ER

## 2021-12-11 NOTE — Assessment & Plan Note (Addendum)
Patient does not appear to be in acute pain Continue Flomax Follow-up UA to evaluate for UTI Urology consulted Will leave n.p.o. tonight

## 2021-12-11 NOTE — Assessment & Plan Note (Signed)
No acute disused suspected

## 2021-12-12 DIAGNOSIS — G9341 Metabolic encephalopathy: Secondary | ICD-10-CM | POA: Diagnosis not present

## 2021-12-12 LAB — BASIC METABOLIC PANEL
Anion gap: 6 (ref 5–15)
BUN: 9 mg/dL (ref 8–23)
CO2: 25 mmol/L (ref 22–32)
Calcium: 8.9 mg/dL (ref 8.9–10.3)
Chloride: 107 mmol/L (ref 98–111)
Creatinine, Ser: 1.03 mg/dL (ref 0.61–1.24)
GFR, Estimated: >60 mL/min (ref >60)
Glucose, Bld: 94 mg/dL (ref 70–99)
Potassium: 3.4 mmol/L — ABNORMAL LOW (ref 3.5–5.1)
Sodium: 138 mmol/L (ref 135–145)

## 2021-12-12 LAB — CBC
HCT: 31.2 % — ABNORMAL LOW (ref 39.0–52.0)
Hemoglobin: 9.3 g/dL — ABNORMAL LOW (ref 13.0–17.0)
MCH: 24.7 pg — ABNORMAL LOW (ref 26.0–34.0)
MCHC: 29.8 g/dL — ABNORMAL LOW (ref 30.0–36.0)
MCV: 82.8 fL (ref 80.0–100.0)
Platelets: 196 10*3/uL (ref 150–400)
RBC: 3.77 MIL/uL — ABNORMAL LOW (ref 4.22–5.81)
RDW: 18.5 % — ABNORMAL HIGH (ref 11.5–15.5)
WBC: 5.2 10*3/uL (ref 4.0–10.5)
nRBC: 0 % (ref 0.0–0.2)

## 2021-12-12 LAB — PROTIME-INR
INR: 1.2 (ref 0.8–1.2)
Prothrombin Time: 15.5 seconds — ABNORMAL HIGH (ref 11.4–15.2)

## 2021-12-12 NOTE — ED Notes (Signed)
ED TO INPATIENT HANDOFF REPORT  ED Nurse Name and Phone #: Darnelle Maffucci [phone]  K Name/Age/Gender [name] 71 y.o. male Room/Bed: ED17A/ED17A  Code Status   Code Status: Full Code  Home/SNF/Other Skilled nursing facility confused Is this baseline? Yes   Triage Complete: Triage complete  Chief Complaint Acute metabolic encephalopathy [K93.81]  Triage Note Patient arrived by EMS from [facility]. Staff called EMS due to low oxygen readings at facility. History GI bleeds and anemia. Has advanced dementia and uncooperative    Allergies No Known Allergies  Level of Care/Admitting Diagnosis ED Disposition     ED Disposition  Admit   Condition  --   Comment  Hospital Area: [facility] [[id number]]  Level of Care: Telemetry Medical [104]  Covid Evaluation: Asymptomatic - no recent exposure (last 10 days) testing not required  Diagnosis: Acute metabolic encephalopathy [8299371]  Admitting Physician: Athena Masse [[id number]]  Attending Physician: Athena Masse [[id number]]  Estimated length of stay: past midnight tomorrow  Certification:: I certify this patient will need inpatient services for at least 2 midnights          B Medical/Surgery History Past Medical History:  Diagnosis Date   Acute cystitis with hematuria    Anemia    Dementia ([place])    Hepatitis    C+ -questionable in pcp note   HTN (hypertension)    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-has seen Dr Delaine Lame   Prostate cancer [facility] 02/2016   Prostate enlargement    Urinary catheter insertion/adjustment/removal    Urinary retention    UTI (lower urinary tract infection)    UTI symptoms    Weight loss    Past Surgical History:  Procedure Laterality Date   ESOPHAGOGASTRODUODENOSCOPY N/A 11/21/2021   Procedure: ESOPHAGOGASTRODUODENOSCOPY (EGD);  Surgeon: Annamaria Helling, DO;  Location: [facility] ENDOSCOPY;  Service: Gastroenterology;  Laterality: N/A;   IR  ANGIOGRAM SELECTIVE EACH ADDITIONAL VESSEL  11/25/2021   IR EMBO ART  VEN HEMORR LYMPH EXTRAV  INC GUIDE ROADMAPPING  11/25/2021   IR TRANSHEPATIC PORTOGRAM WO HEMO  11/25/2021   IR US GUIDE VASC ACCESS RIGHT  11/25/2021   TRANSURETHRAL RESECTION OF BLADDER TUMOR N/A 08/17/2015   Procedure: TRANSURETHRAL RESECTION OF BLADDER TUMOR (TURBT);  Surgeon: [name], MD;  Location: [facility] ORS;  Service: Urology;  Laterality: N/A;   TRANSURETHRAL RESECTION OF PROSTATE N/A 08/17/2015   Procedure: TRANSURETHRAL RESECTION OF THE PROSTATE (TURP);  Surgeon: [name], MD;  Location: [facility] ORS;  Service: Urology;  Laterality: N/A;   XI ROBOTIC ASSISTED VENTRAL HERNIA N/A 09/30/2020   Procedure: XI ROBOTIC ASSISTED VENTRAL HERNIA;  Surgeon: Herbert Pun, MD;  Location: [facility] ORS;  Service: General;  Laterality: N/A;     A IV Location/Drains/Wounds Patient Lines/Drains/Airways Status     Active Line/Drains/Airways     Name Placement date Placement time Site Days   Peripheral IV 12/11/21 20 G Left Antecubital 12/11/21  2044  Antecubital  1   External Urinary Catheter 12/12/21  0054  --  less than 1            Intake/Output Last 24 hours  Intake/Output Summary (Last 24 hours) at 12/12/2021 1300 Last data filed at 12/11/2021 2321 Gross per 24 hour  Intake 1000 ml  Output --  Net 1000 ml    Labs/Imaging Results for orders placed or performed during the hospital encounter of 12/11/21 (from the past 48 hour(s))  CBC with  Differential     Status: Abnormal   Collection Time: 12/11/21  4:23 PM  Result Value Ref Range   WBC 8.0 4.0 - 10.5 K/uL   RBC 4.04 (L) 4.22 - 5.81 MIL/uL   Hemoglobin 10.1 (L) 13.0 - 17.0 g/dL   HCT 35.2 (L) 39.0 - 52.0 %   MCV 87.1 80.0 - 100.0 fL   MCH 25.0 (L) 26.0 - 34.0 pg   MCHC 28.7 (L) 30.0 - 36.0 g/dL   RDW 18.3 (H) 11.5 - 15.5 %   Platelets 251 150 - 400 K/uL   nRBC 0.0 0.0 - 0.2 %   Neutrophils Relative % 74 %   Neutro Abs 5.9 1.7 - 7.7 K/uL    Lymphocytes Relative 15 %   Lymphs Abs 1.2 0.7 - 4.0 K/uL   Monocytes Relative 8 %   Monocytes Absolute 0.7 0.1 - 1.0 K/uL   Eosinophils Relative 2 %   Eosinophils Absolute 0.2 0.0 - 0.5 K/uL   Basophils Relative 1 %   Basophils Absolute 0.1 0.0 - 0.1 K/uL   Immature Granulocytes 0 %   Abs Immature Granulocytes 0.03 0.00 - 0.07 K/uL    Comment: Performed at [facility], [address] [place], [place] [address]  Comprehensive metabolic panel     Status: Abnormal   Collection Time: 12/11/21  4:23 PM  Result Value Ref Range   Sodium 136 135 - 145 mmol/L   Potassium 3.7 3.5 - 5.1 mmol/L   Chloride 104 98 - 111 mmol/L   CO2 21 (L) 22 - 32 mmol/L   Glucose, Bld 96 70 - 99 mg/dL    Comment: Glucose reference range applies only to samples taken after fasting for at least 8 hours.   BUN 11 8 - 23 mg/dL   Creatinine, Ser 1.43 (H) 0.61 - 1.24 mg/dL   Calcium 9.3 8.9 - 10.3 mg/dL   Total Protein 9.0 (H) 6.5 - 8.1 g/dL   Albumin 3.7 3.5 - 5.0 g/dL   AST 54 (H) 15 - 41 U/L   ALT 32 0 - 44 U/L   Alkaline Phosphatase 104 38 - 126 U/L   Total Bilirubin 0.7 0.3 - 1.2 mg/dL   GFR, Estimated 52 (L) >60 mL/min    Comment: (NOTE) Calculated using the CKD-EPI Creatinine Equation (2021)    Anion gap 11 5 - 15    Comment: Performed at [facility], [address] [place], [place] [address]  Ammonia     Status: None   Collection Time: 12/11/21  8:06 PM  Result Value Ref Range   Ammonia 14 9 - 35 umol/L    Comment: Performed at [facility], [address] [place], [place] [address]  Type and screen [place]     Status: None   Collection Time: 12/11/21  8:46 PM  Result Value Ref Range   ABO/RH(D) O POS    Antibody Screen NEG    Sample Expiration      12/14/2021,2359 Performed at [facility], [address] [place], [place] [address]   CBC     Status: Abnormal   Collection Time: 12/12/21  5:25 AM  Result Value Ref Range    WBC 5.2 4.0 - 10.5 K/uL   RBC 3.77 (L) 4.22 - 5.81 MIL/uL   Hemoglobin 9.3 (L) 13.0 - 17.0 g/dL   HCT 31.2 (L) 39.0 - 52.0 %   MCV 82.8 80.0 - 100.0 fL   MCH 24.7 (L)  26.0 - 34.0 pg   MCHC 29.8 (L) 30.0 - 36.0 g/dL   RDW 18.5 (H) 11.5 - 15.5 %   Platelets 196 150 - 400 K/uL   nRBC 0.0 0.0 - 0.2 %    Comment: Performed at [facility], [address] [place], [place] [address]  Basic metabolic panel     Status: Abnormal   Collection Time: 12/12/21  5:25 AM  Result Value Ref Range   Sodium 138 135 - 145 mmol/L   Potassium 3.4 (L) 3.5 - 5.1 mmol/L   Chloride 107 98 - 111 mmol/L   CO2 25 22 - 32 mmol/L   Glucose, Bld 94 70 - 99 mg/dL    Comment: Glucose reference range applies only to samples taken after fasting for at least 8 hours.   BUN 9 8 - 23 mg/dL   Creatinine, Ser 1.03 0.61 - 1.24 mg/dL   Calcium 8.9 8.9 - 10.3 mg/dL   GFR, Estimated >60 >60 mL/min    Comment: (NOTE) Calculated using the CKD-EPI Creatinine Equation (2021)    Anion gap 6 5 - 15    Comment: Performed at [facility], [address] [place], [place] [address]   CT Head Wo Contrast  Result Date: 12/11/2021 CLINICAL DATA:  Altered mental status with history of GI bleed and anemia. EXAM: CT HEAD WITHOUT CONTRAST TECHNIQUE: Contiguous axial images were obtained from the base of the skull through the vertex without intravenous contrast. RADIATION DOSE REDUCTION: This exam was performed according to the departmental dose-optimization program which includes automated exposure control, adjustment of the mA and/or kV according to patient size and/or use of iterative reconstruction technique. COMPARISON:  December 20, 2021 FINDINGS: Brain: There is moderate severity cerebral atrophy with widening of the extra-axial spaces and ventricular dilatation. There are areas of decreased attenuation within the white matter tracts of the supratentorial brain, consistent with microvascular disease changes. Vascular: No  hyperdense vessel or unexpected calcification. Skull: Normal. Negative for fracture or focal lesion. Sinuses/Orbits: No acute finding. 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 IMPRESSION: 1. No acute intracranial abnormality. 2. Generalized cerebral atrophy and microvascular disease changes of the supratentorial brain. Electronically Signed   By: Virgina Norfolk M.D.   On: 12/11/2021 21:34   CT ABDOMEN PELVIS W CONTRAST  Result Date: 12/11/2021 CLINICAL DATA:  History of GI bleed with low oxygen readings at facility. EXAM: CT ABDOMEN AND PELVIS WITH CONTRAST TECHNIQUE: Multidetector CT imaging of the abdomen and pelvis was performed using the standard protocol following bolus administration of intravenous contrast. RADIATION DOSE REDUCTION: This exam was performed according to the departmental dose-optimization program which includes automated exposure control, adjustment of the mA and/or kV according to patient size and/or use of iterative reconstruction technique. CONTRAST:  129m OMNIPAQUE IOHEXOL 300 MG/ML  SOLN COMPARISON:  November 21, 2021 FINDINGS: Lower chest: Mild atelectasis is seen within the posterior aspects of the bilateral lung bases. Hepatobiliary: Metallic density foci are seen within the posterolateral aspect of the right lobe of the liver. Associated streak artifact is seen. This represents a new finding when compared to the prior study. The gallbladder is normal in appearance, without evidence of gallstones, gallbladder wall thickening or pericholecystic inflammation. No biliary dilatation is seen Pancreas: Unremarkable. No pancreatic ductal dilatation or surrounding inflammatory changes. Spleen: A predominant stable, 4.0 cm x 3.7 cm area of parenchymal low attenuation is seen within the anterior aspect of the spleen. Adrenals/Urinary Tract: Adrenal glands are unremarkable.  Kidneys are normal in size. A stable subcentimeter simple cyst is seen within the posterior aspect  of the upper pole of the left kidney. A 4 mm obstructing renal calculus is seen within the proximal right ureter. Mild right-sided hydronephrosis and hydroureter are seen. A 4.1 cm x 3.0 cm x 2.7 cm area of heterogeneous soft tissue attenuation is seen within the anterolateral aspect of the base of the urinary bladder, to the right of midline. Stomach/Bowel: Stomach is within normal limits. Appendix appears normal. No evidence of bowel wall thickening, distention, or inflammatory changes. Noninflamed diverticula are seen throughout the sigmoid colon. Vascular/Lymphatic: Aortic atherosclerosis. There is mild, stable aortocaval lymphadenopathy. Reproductive: The prostate gland is surgically absent. Other: No abdominal wall hernia or abnormality. No abdominopelvic ascites. Musculoskeletal: Multilevel degenerative changes seen throughout the lumbar spine. IMPRESSION: 1. 4 mm obstructing right ureteral calculus. 2. 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 Correlation with cystoscopy is recommended. 3. Sigmoid diverticulosis. 4. Postprocedural changes suspected within the right lobe of the liver. Aortic Atherosclerosis (ICD10-I70.0). Electronically Signed   By: Virgina Norfolk M.D.   On: 12/11/2021 21:32   DG Chest Portable 1 View  Result Date: 12/11/2021 CLINICAL DATA:  Weakness and altered mental status. EXAM: PORTABLE CHEST 1 VIEW COMPARISON:  09/16/2014 FINDINGS: Low lung volumes. Mild bibasilar atelectasis. No confluent consolidation. No pulmonary edema, pleural effusion, or pneumothorax. No acute osseous abnormalities are seen. IMPRESSION: Low lung volumes with mild bibasilar atelectasis. Electronically Signed   By: Keith Rake M.D.   On: 12/11/2021 20:36    Pending Labs Unresulted Labs (From admission, onward)     Start     Ordered   12/12/21 0020  Protime-INR  Once,   R        12/12/21 0020   12/11/21 2004  Urinalysis,  Routine w reflex microscopic Urine, Clean Catch  ONCE - URGENT,   URGENT        12/11/21 2003            Vitals/Pain Today's Vitals   12/12/21 1145 12/12/21 1150 12/12/21 1257 12/12/21 1258  BP:  100/71 116/74   Pulse:  61 (!) 52   Resp:  17 16   Temp:  98.5 F (36.9 C) 98 F (36.7 C)   TempSrc:  Oral Oral   SpO2:  100% 100%   Weight:      Height:      PainSc: Asleep Asleep 0-No pain Asleep    Isolation Precautions No active isolations  Medications Medications  amLODipine (NORVASC) tablet 10 mg (10 mg Oral Given 12/11/21 2342)  atenolol (TENORMIN) tablet 25 mg (25 mg Oral Given 12/12/21 1009)  donepezil (ARICEPT) tablet 10 mg (has no administration in time range)  memantine (NAMENDA) tablet 5 mg (5 mg Oral Given 12/12/21 1009)  pantoprazole (PROTONIX) EC tablet 40 mg (40 mg Oral Given 12/12/21 0742)  finasteride (PROSCAR) tablet 5 mg (has no administration in time range)  tamsulosin (FLOMAX) capsule 0.4 mg (has no administration in time range)  divalproex (DEPAKOTE ER) 24 hr tablet 250 mg (has no administration in time range)  cholecalciferol (VITAMIN D3) tablet 1,000 Units (1,000 Units Oral Given 12/12/21 1010)  multivitamin with minerals tablet 1 tablet (has no administration in time range)  acetaminophen (TYLENOL) tablet 650 mg (has no administration in time range)    Or  acetaminophen (TYLENOL) suppository 650 mg (has no administration in time range)  ondansetron (ZOFRAN)  tablet 4 mg (has no administration in time range)    Or  ondansetron (ZOFRAN) injection 4 mg (has no administration in time range)  0.9 %  sodium chloride infusion (0 mLs Intravenous Stopped 12/12/21 0959)  morphine (PF) 2 MG/ML injection 2 mg (has no administration in time range)  sodium chloride 0.9 % bolus 1,000 mL (0 mLs Intravenous Stopped 12/11/21 2321)  iohexol (OMNIPAQUE) 300 MG/ML solution 100 mL (100 mLs Intravenous Contrast Given 12/11/21 2114)    Mobility non-ambulatory High fall risk       R Recommendations: See Admitting Provider Note  Report given to:   Additional Notes: Pt from [facility], hx of advanced dementia, pt is not oriented and will occasionally say a word, pt doesn't follow directions, pt satting well on room air.

## 2021-12-12 NOTE — ED Notes (Signed)
RN in room to draw pts morning labs, pt laying in bed with eyes open. Pt spoke a few words and was cooperative during blood draw. Pt is in no acute distress at this time and is resting comfortably.

## 2021-12-12 NOTE — Progress Notes (Signed)
[place] [facility] [place] NAME: Daniel Knapp    MR#:  [id number]  DATE OF BIRTH:  [birth year]  SUBJECTIVE:  patient was recently discharged to [facility] after evaluation of G.I. bleed at [facility]. Comes in from facility with agitation and not his usual self. Has baseline dementia unable to give any history review of systems during my eval. Patient is sleepy no family at bedside.  In the ER was noted to have abnormal CT scan of the abdomen. Urology was consulted.  VITALS:  Blood pressure 116/74, pulse (!) 52, temperature 98 F (36.7 C), temperature source Oral, resp. rate 16, height '6\' 2"'$  (1.88 m), weight 86.2 kg, SpO2 100 %.  PHYSICAL EXAMINATION:  limited exam GENERAL:  71 y.o.-year-old patient lying in the bed with no acute distress.  LUNGS: Normal breath sounds bilaterally, no wheezing CARDIOVASCULAR: S1, S2 normal. No murmurs ABDOMEN: Soft, nontender, nondistended. Bowel sounds present.  EXTREMITIES: No  edema b/l.    NEUROLOGIC: patient is sleepy and has dementia at baseline SKIN: per RN LABORATORY PANEL:  CBC Recent Labs  Lab 12/12/21 0525  WBC 5.2  HGB 9.3*  HCT 31.2*  PLT 196    Chemistries  Recent Labs  Lab 12/11/21 1623 12/12/21 0525  NA 136 138  K 3.7 3.4*  CL 104 107  CO2 21* 25  GLUCOSE 96 94  BUN 11 9  CREATININE 1.43* 1.03  CALCIUM 9.3 8.9  AST 54*  --   ALT 32  --   ALKPHOS 104  --   BILITOT 0.7  --    Cardiac Enzymes No results for input(s): "TROPONINI" in the last 168 hours. RADIOLOGY:  CT Head Wo Contrast  Result Date: 12/11/2021 CLINICAL DATA:  Altered mental status with history of GI bleed and anemia. EXAM: CT HEAD WITHOUT CONTRAST TECHNIQUE: Contiguous axial images were obtained from the base of the skull through the vertex without intravenous contrast. RADIATION DOSE REDUCTION: This exam was performed according to the departmental dose-optimization program which includes automated exposure  control, adjustment of the mA and/or kV according to patient size and/or use of iterative reconstruction technique. COMPARISON:  December 20, 2021 FINDINGS: Brain: There is moderate severity cerebral atrophy with widening of the extra-axial spaces and ventricular dilatation. There are areas of decreased attenuation within the white matter tracts of the supratentorial brain, consistent with microvascular disease changes. Vascular: No hyperdense vessel or unexpected calcification. Skull: Normal. Negative for fracture or focal lesion. Sinuses/Orbits: No acute finding. 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 IMPRESSION: 1. No acute intracranial abnormality. 2. Generalized cerebral atrophy and microvascular disease changes of the supratentorial brain. Electronically Signed   By: Virgina Norfolk M.D.   On: 12/11/2021 21:34   CT ABDOMEN PELVIS W CONTRAST  Result Date: 12/11/2021 CLINICAL DATA:  History of GI bleed with low oxygen readings at facility. EXAM: CT ABDOMEN AND PELVIS WITH CONTRAST TECHNIQUE: Multidetector CT imaging of the abdomen and pelvis was performed using the standard protocol following bolus administration of intravenous contrast. RADIATION DOSE REDUCTION: This exam was performed according to the departmental dose-optimization program which includes automated exposure control, adjustment of the mA and/or kV according to patient size and/or use of iterative reconstruction technique. CONTRAST:  135m OMNIPAQUE IOHEXOL 300 MG/ML  SOLN COMPARISON:  November 21, 2021 FINDINGS: Lower chest: Mild atelectasis is seen within the posterior aspects of the bilateral lung bases. Hepatobiliary: Metallic density foci are seen within the  posterolateral aspect of the right lobe of the liver. Associated streak artifact is seen. This represents a new finding when compared to the prior study. The gallbladder is normal in appearance, without evidence of gallstones, gallbladder wall thickening or  pericholecystic inflammation. No biliary dilatation is seen Pancreas: Unremarkable. No pancreatic ductal dilatation or surrounding inflammatory changes. Spleen: A predominant stable, 4.0 cm x 3.7 cm area of parenchymal low attenuation is seen within the anterior aspect of the spleen. Adrenals/Urinary Tract: Adrenal glands are unremarkable. Kidneys are normal in size. A stable subcentimeter simple cyst is seen within the posterior aspect of the upper pole of the left kidney. A 4 mm obstructing renal calculus is seen within the proximal right ureter. Mild right-sided hydronephrosis and hydroureter are seen. A 4.1 cm x 3.0 cm x 2.7 cm area of heterogeneous soft tissue attenuation is seen within the anterolateral aspect of the base of the urinary bladder, to the right of midline. Stomach/Bowel: Stomach is within normal limits. Appendix appears normal. No evidence of bowel wall thickening, distention, or inflammatory changes. Noninflamed diverticula are seen throughout the sigmoid colon. Vascular/Lymphatic: Aortic atherosclerosis. There is mild, stable aortocaval lymphadenopathy. Reproductive: The prostate gland is surgically absent. Other: No abdominal wall hernia or abnormality. No abdominopelvic ascites. Musculoskeletal: Multilevel degenerative changes seen throughout the lumbar spine. IMPRESSION: 1. 4 mm obstructing right ureteral calculus. 2. 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 Correlation with cystoscopy is recommended. 3. Sigmoid diverticulosis. 4. Postprocedural changes suspected within the right lobe of the liver. Aortic Atherosclerosis (ICD10-I70.0). Electronically Signed   By: Virgina Norfolk M.D.   On: 12/11/2021 21:32   DG Chest Portable 1 View  Result Date: 12/11/2021 CLINICAL DATA:  Weakness and altered mental status. EXAM: PORTABLE CHEST 1 VIEW COMPARISON:  09/16/2014 FINDINGS: Low lung volumes. Mild bibasilar  atelectasis. No confluent consolidation. No pulmonary edema, pleural effusion, or pneumothorax. No acute osseous abnormalities are seen. IMPRESSION: Low lung volumes with mild bibasilar atelectasis. Electronically Signed   By: Keith Rake M.D.   On: 12/11/2021 20:36    Assessment and Plan  KEONDRICK DILKS is a 71 y.o. male with medical history significant for Dementia, untreated hepatitis C, cirrhosis, hypertension, recently hospitalized from 6/15 to 6/19 with acute blood loss anemia secondary to bleeding gastric varix, requiring 4 units PRBCs for hemoglobin 3.7, undergoing unsuccessful hepatic parenchymal tract coil embolization by IR on 6/17 who was brought to the ED by EMS for evaluation of altered mental status.   Acute metabolic encephalopathy Unclear etiology Follow urinalysis to evaluate for infection Given obstructing ureteral calculus could be related to pain Pain control and delirium precautions   AKI (acute kidney injury) ([place]) Possibly obstructive -- came in with creatinine of 1.43-- down to 1.03 --IV hydration and monitor --Continue Flomax   Hydronephrosis with urinary obstruction due to right ureteral calculus bladder mass noted on CT abdomen history of prostate cancer status post TURP --Patient does not appear to be in acute pain - -Continue Flomax --Follow-up UA to evaluate for UTI --Urology consulted--Dr sninsky does not recommend any intervention at present given patient is asymptomatic. Bladder mass as noted on CT scan appears to be recurrent prostate cancer according to urology. He will discuss with patient's sister regarding how much aggressive family wants to be and accordingly evaluation will be ordered. 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 Glori Luis Family communication : urology does discussed with sister CODE STATUS: full Level of care: Telemetry Medical Status is: Inpatient Remains inpatient appropriate because: acute metabolic encephalopathy, abnormal CT abdomen  TOC for discharge planning patient came from [place]: 35 minutes.  >50% time spent on counselling and coordination of care  Note: This dictation was prepared with Dragon dictation along with smaller phrase technology. Any transcriptional errors that result from this process are unintentional.  Fritzi Mandes M.D    [facility]   CC: Primary care physician; Derinda Late, MD

## 2021-12-12 NOTE — ED Notes (Signed)
Bed alarm on.

## 2021-12-12 NOTE — ED Notes (Signed)
Pt in bed, pt voided in bed, pt has pulled off male purewick, changed bedding, pt awake but doesn't answer questions, pt occasionally makes a word.  Resps even and unlabored,

## 2021-12-12 NOTE — Plan of Care (Signed)
?  Problem: Clinical Measurements: ?Goal: Will remain free from infection ?Outcome: Progressing ?Goal: Diagnostic test results will improve ?Outcome: Progressing ?Goal: Respiratory complications will improve ?Outcome: Progressing ?  ?

## 2021-12-12 NOTE — ED Notes (Signed)
Pt sister states he is at his baseline for mentation but is unable to het up and move around as he normally does

## 2021-12-12 NOTE — Consult Note (Incomplete)
Urology Consult   I have been asked to see the patient by Dr. Damita Dunnings for evaluation and management of right ureteral stone, possible bladder/prostate mass.  Chief Complaint: ***  HPI:  Daniel Knapp is a 71 y.o. 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-has seen Dr Delaine Lame   Prostate cancer [facility]) 02/2016   Prostate enlargement    Urinary catheter insertion/adjustment/removal    Urinary retention    UTI (lower urinary tract infection)    UTI symptoms    Weight loss     Surgical History: Past Surgical History:  Procedure Laterality Date   ESOPHAGOGASTRODUODENOSCOPY N/A 11/21/2021   Procedure: ESOPHAGOGASTRODUODENOSCOPY (EGD);  Surgeon: Annamaria Helling, DO;  Location: [facility] ENDOSCOPY;  Service: Gastroenterology;  Laterality: N/A;   IR ANGIOGRAM SELECTIVE EACH ADDITIONAL VESSEL  11/25/2021   IR EMBO ART  VEN HEMORR LYMPH EXTRAV  INC GUIDE ROADMAPPING  11/25/2021   IR TRANSHEPATIC PORTOGRAM WO HEMO  11/25/2021   IR US GUIDE VASC ACCESS RIGHT  11/25/2021   TRANSURETHRAL RESECTION OF BLADDER TUMOR N/A 08/17/2015   Procedure: TRANSURETHRAL RESECTION OF BLADDER TUMOR (TURBT);  Surgeon: [name], MD;  Location: [facility] ORS;  Service: Urology;  Laterality: N/A;   TRANSURETHRAL RESECTION OF PROSTATE N/A 08/17/2015   Procedure: TRANSURETHRAL RESECTION OF THE PROSTATE (TURP);  Surgeon: [name], MD;  Location: [facility] ORS;  Service: Urology;  Laterality: N/A;   XI ROBOTIC ASSISTED VENTRAL HERNIA N/A 09/30/2020   Procedure: XI ROBOTIC ASSISTED VENTRAL HERNIA;  Surgeon: Herbert Pun, MD;  Location: [facility] ORS;  Service: General;  Laterality: N/A;    Home Medications:  Allergies as of 12/12/2021   No Known Allergies   Med Rec must be completed prior to using this  [name]       Allergies: No Known Allergies  Family History: Family History  Problem Relation Age of Onset   Heart disease Mother    Cancer Father    Kidney disease Neg Hx    Prostate cancer Neg Hx     Social History:  reports that he has never smoked. He has never used smokeless tobacco. He reports that he does not currently use alcohol. He reports that he does not currently use drugs after having used the following drugs: Marijuana.  ROS: Negative aside from those stated in the HPI.  Physical Exam: BP 117/79   Pulse 65   Temp 98.4 F (36.9 C)   Resp 19   Ht '6\' 2"'$  (1.88 m)   Wt 86.2 kg   SpO2 100%   BMI 24.39 kg/m    Constitutional:  Alert and oriented, No acute distress. Cardiovascular: No clubbing, cyanosis, or edema. Respiratory: Normal respiratory effort, no increased work of breathing. GI: Abdomen is soft, nontender, nondistended, no abdominal masses GU: *** CVA tenderness, phallus without lesions, ***widely patent meatus DRE: *** Lymph: No cervical or inguinal lymphadenopathy. Skin: No rashes, bruises or suspicious lesions. Neurologic: Grossly intact, no focal deficits, moving all 4 extremities. Psychiatric: Normal mood and affect. Drains: ***  Laboratory Data: ***  Pertinent Imaging: I have personally reviewed the ***.  Assessment & Plan:   ***  Recommendations: ***  Billey Co, MD  Total time spent  on the floor was *** minutes, with greater than 50% spent in counseling and coordination of care with the patient regarding ***.  [place] [address], [place], [place] [address] [phone]

## 2021-12-13 DIAGNOSIS — F02818 Dementia in other diseases classified elsewhere, unspecified severity, with other behavioral disturbance: Secondary | ICD-10-CM

## 2021-12-13 DIAGNOSIS — G9341 Metabolic encephalopathy: Secondary | ICD-10-CM

## 2021-12-13 DIAGNOSIS — N201 Calculus of ureter: Secondary | ICD-10-CM

## 2021-12-13 DIAGNOSIS — G309 Alzheimer's disease, unspecified: Secondary | ICD-10-CM | POA: Diagnosis not present

## 2021-12-13 DIAGNOSIS — Z7189 Other specified counseling: Secondary | ICD-10-CM

## 2021-12-13 DIAGNOSIS — K7469 Other cirrhosis of liver: Secondary | ICD-10-CM | POA: Diagnosis not present

## 2021-12-13 DIAGNOSIS — Z515 Encounter for palliative care: Secondary | ICD-10-CM | POA: Diagnosis not present

## 2021-12-13 MED ORDER — ENOXAPARIN SODIUM 40 MG/0.4ML IJ SOSY
40.0000 mg | PREFILLED_SYRINGE | INTRAMUSCULAR | Status: DC
  Administered 2021-12-13 – 2021-12-17 (×5): 40 mg via SUBCUTANEOUS
  Filled 2021-12-13 (×5): qty 0.4

## 2021-12-13 MED ORDER — PANTOPRAZOLE 2 MG/ML SUSPENSION
40.0000 mg | Freq: Two times a day (BID) | ORAL | Status: DC
  Administered 2021-12-13 – 2021-12-18 (×10): 40 mg via ORAL
  Filled 2021-12-13 (×11): qty 20

## 2021-12-13 MED ORDER — DIVALPROEX SODIUM 125 MG PO CSDR
125.0000 mg | DELAYED_RELEASE_CAPSULE | Freq: Three times a day (TID) | ORAL | Status: DC
  Administered 2021-12-13 – 2021-12-18 (×15): 125 mg via ORAL
  Filled 2021-12-13 (×16): qty 1

## 2021-12-13 MED ORDER — POTASSIUM CHLORIDE 20 MEQ PO PACK
40.0000 meq | PACK | Freq: Once | ORAL | Status: AC
Start: 2021-12-13 — End: 2021-12-13
  Administered 2021-12-13: 40 meq via ORAL
  Filled 2021-12-13 (×2): qty 2

## 2021-12-13 NOTE — Consult Note (Signed)
Consultation Note Date: 12/13/2021   Patient Name: Daniel Knapp  DOB: September 12, 1950  MRN: [id number]  Age / Sex: 71 y.o., male  PCP: Derinda Late, MD Referring Physician: Barb Merino, MD  Reason for Consultation: Establishing goals of care  HPI/Patient Profile: 71 y.o. male   admitted on 12/11/2021 with past medical history significant for dementia, untreated hepatitis C, cirrhosis/ETOH, hypertension, recently hospitalized from 6/15 to 6/19 with acute blood loss anemia secondary to bleeding gastric varies  requiring 4 units PRBCs for hemoglobin 3.7, undergoing unsuccessful hepatic parenchymal tract coil embolization by IR on 6/17 who was brought to the ED by EMS for evaluation of altered mental status.   Patient is lethargic with poor p.o. intake  Family face treatment option decision,Is advanced directive decisions and anticipatory care needs.    Clinical Assessment and Goals of Care:  This NP Wadie Lessen reviewed medical records, received report from team, assessed the patient and then spoke to the patient's sister/Daniel Knapp by telephone to discuss diagnosis, prognosis, GOC, EOL wishes disposition and options.   Concept of Palliative Care was introduced as specialized medical care for people and their families living with serious illness.  If focuses on providing relief from the symptoms and stress of a serious illness.  The goal is to improve quality of life for both the patient and the family. Values and goals of care important to patient and family were attempted to be elicited.  Created space and opportunity for family to explore thoughts and feelings regarding current medical situation.  Patient's sister reports the patient has had significant continued visit, goal functional and cognitive decline over the past several years.  Family have been caring for him at home since 2018, they have some  out-of-pocket caregivers. Family verbalized that things are getting more difficult at home and conversation needs to be had around possible long-term placement.  Apparently patient has spent several weeks at [facility].     A  discussion was had today regarding advanced directives.  Concepts specific to code status, artifical feeding and hydration, continued IV antibiotics and rehospitalization was had.   Specific education offered on artificial feeding:   "Health care professionals commonly rely on feeding tubes to supply nutrition to these severely demented patients. However, various studies have not shown use of feeding tubes to be effective in preventing malnutrition. Furthermore, they have not been demonstrated to prevent the occurrence or increase the healing of pressure sores, prevent aspiration pneumonia, provide comfort, improve functional status, or extend life. High complication rates, increased use of restraints, and other adverse effects further increase the burden of feeding tubes in severely demented patients"   The difference between a aggressive medical intervention path  and a palliative comfort care path for this patient at this time was had.      Education offered regarding the MOST form    Natural trajectory and expectations at EOL were discussed.  Questions and concerns addressed.  Patient  encouraged to call with questions or concerns.  PMT will continue to support holistically.   No documentation noted regarding H POA or advanced care planning.  Sister says she will bring paperwork in tomorrow.    NEXT OF KIN    SUMMARY OF RECOMMENDATIONS    Code Status/Advance Care Planning: Full code        -Educated family to consider DNR/DNI status understanding evidenced based poor outcomes in similar hospitalized patient, as the cause of arrest is likely associated with advanced chronic illness rather than an easily reversible acute cardio-pulmonary event.     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 The following aspects are pertinent.  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-has seen Dr Delaine Lame   Prostate cancer [facility]) 02/2016   Prostate enlargement    Urinary catheter insertion/adjustment/removal    Urinary retention    UTI (lower urinary tract infection)    UTI symptoms    Weight loss    Social History   Socioeconomic History   Marital status: Single    Spouse name: Not on file   Number of children: Not on file   Years of education: Not on file   Highest education level: Not on file  Occupational History   Not on file  Tobacco Use   Smoking status: Never   Smokeless tobacco: Never  Vaping Use   Vaping Use: Never used  Substance and Sexual Activity   Alcohol use: Not Currently    Alcohol/week: 0.0 standard drinks of alcohol   Drug use: Not Currently    Types: Marijuana    Comment: years ago   Sexual activity: Not on file  Other Topics Concern   Not on file  Social History Narrative   Not on file   Social Determinants of Health   Financial Resource Strain: Not on file  Food Insecurity: Not on file  Transportation Needs: Not on file  Physical Activity: Not on file  Stress: Not on file   Social Connections: Not on file   Family History  Problem Relation Age of Onset   Heart disease Mother    Cancer Father    Kidney disease Neg Hx    Prostate cancer Neg Hx    Scheduled Meds:  amLODipine  10 mg Oral Q1500   atenolol  25 mg Oral Daily   cholecalciferol  1,000 Units Oral Daily   divalproex  125 mg Oral Q8H   donepezil  10 mg Oral Q1500   finasteride  5 mg Oral Q1500   memantine  5 mg Oral BID   multivitamin with minerals  1 tablet Oral Q1500   pantoprazole sodium  40 mg Oral BID   Continuous Infusions: PRN Meds:.acetaminophen **OR** acetaminophen, morphine injection, ondansetron **OR** ondansetron (ZOFRAN) IV Medications Prior to Admission:  Prior to Admission medications  Medication Sig Start Date End Date Taking? Authorizing Provider  amLODipine (NORVASC) 10 MG tablet Take 10 mg by mouth daily in the afternoon.   Yes [provider]  atenolol (TENORMIN) 25 MG tablet Take 25 mg by mouth daily. 10/06/21  Yes [provider]  cholecalciferol (VITAMIN D) 25 MCG (1000 UNIT) tablet Take 1,000 Units by mouth daily.   Yes [provider]  divalproex (DEPAKOTE ER) 250 MG 24 hr tablet Take 250 mg by mouth daily in the afternoon. 07/25/20  Yes [provider]  donepezil (ARICEPT) 10 MG tablet Take 10 mg by mouth daily in the afternoon. 07/09/20  Yes [provider]  finasteride (PROSCAR) 5 MG tablet Take 5 mg by mouth daily in the afternoon. Reported on 08/17/2015   Yes [provider]  memantine (NAMENDA) 5 MG tablet Take 5 mg by mouth 2 (two) times daily. 08/14/20  Yes [provider]  Multiple Vitamin (MULTIVITAMIN WITH MINERALS) TABS tablet Take 1 tablet by mouth daily in the afternoon.   Yes [provider]  pantoprazole (PROTONIX) 40 MG tablet Take 1 tablet (40 mg total) by mouth 2 (two) times daily before a meal. 11/27/21  Yes Thurnell Lose, MD  tamsulosin (FLOMAX) 0.4 MG CAPS capsule Take 0.4 mg by  mouth daily in the afternoon. Reported on 09/01/2015   Yes [provider]  vitamin B-12 (CYANOCOBALAMIN) 1000 MCG tablet Take 1,000 mcg by mouth daily in the afternoon.   Yes [provider]   No Known Allergies Review of Systems  Unable to perform ROS: Dementia    Physical Exam Constitutional:      Appearance: He is underweight. He is ill-appearing.  Cardiovascular:     Rate and Rhythm: Normal rate.  Pulmonary:     Effort: Pulmonary effort is normal.  Skin:    General: Skin is warm and dry.  Neurological:     Mental Status: He is lethargic.  Psychiatric:        Cognition and Memory: Cognition is impaired.     Vital Signs: BP 131/79 (BP Location: Left Arm)   Pulse 69   Temp 97.6 F (36.4 C) (Axillary)   Resp 16   Ht '6\' 2"'$  (1.88 m)   Wt 86.2 kg   SpO2 100%   BMI 24.39 kg/m  Pain Scale: PAINAD   Pain Score: Asleep   SpO2: SpO2: 100 % O2 Device:SpO2: 100 % O2 Flow Rate: .   IO: Intake/output summary:  Intake/Output Summary (Last 24 hours) at 12/13/2021 1011 Last data filed at 12/13/2021 0550 Gross per 24 hour  Intake --  Output 300 ml  Net -300 ml    LBM: Last BM Date :  (pta) Baseline Weight: Weight: 88.9 kg Most recent weight: Weight: 86.2 kg     Palliative Assessment/Data: 30% at best   Discussed with Dr Sloan Leiter   Signed by: Wadie Lessen, NP   Please contact Palliative Medicine Team phone at [phone] for questions and concerns.  For individual provider: See Shea Evans

## 2021-12-13 NOTE — Evaluation (Signed)
Physical Therapy Evaluation Patient Details Name: Daniel Knapp MRN: [id number] DOB: May 10, 1951 Today's Date: 12/13/2021  History of Present Illness  Daniel Knapp is a 71 y.o. male with medical history significant for Dementia, untreated hepatitis C, cirrhosis, hypertension, recently hospitalized from 6/15 to 6/19 with acute blood loss anemia secondary to bleeding gastric varix, requiring 4 units PRBCs for hemoglobin 3.7, undergoing unsuccessful hepatic parenchymal tract coil embolization by IR on 6/17 who was brought to the ED by EMS for evaluation of altered mental status.   Clinical Impression  Pt admitted with above diagnosis. Pt received supine in bed. Appears agreeable to PT eval smiling and saying yes if asked if I could come in and assess his mobility. Overall though pt mostly non-verbal requiring increased time and frequent re-direction for attempts to stay on task as pt has history of dementia at baseline. Pt overall poor historian with inability to answer orientation questions of PLOF thus relying on EMR. Pt admitted from [facility] but prior was living alone and independent relying on family PRN for ADL's/IADL's. Prior to STR pt requiring 2 person assist for safe mobility.   To date, pt demonstrating poor initiation to movement requiring max multimodal cuing for transfers and very resistive to movement but not because he was not agreeable but appears cognitively is the limiting factor In successful mobility as pt minimally attempting UE/LE sequencing to initiate movement. Pt required maxA to sit EOB and trialed x2 with bed elevated to stand to RW with maxA efforts. Pt demonstrating strong posterior bias leading to LE's sliding anteriorly outside pt's BOS making standing unsafe despite foot blocking efforts and LE placement prior to standing attempts. Pt maxA+2 to return to supine in bed in care of RN at bedside. Pt remains at a very high risk for falls and well below baseline per EMR. STR  remains appropriate to attempt improvement towards independence in functional mobility to reduce caregiver burden and falls risk. Pt currently with functional limitations due to the deficits listed below (see PT Problem List). Pt will benefit from skilled PT to increase their independence and safety with mobility to allow discharge to the venue listed below.      Recommendations for follow up therapy are one component of a multi-disciplinary discharge planning process, led by the attending physician.  Recommendations may be updated based on patient status, additional functional criteria and insurance authorization.  Follow Up Recommendations Skilled nursing-short term rehab (<3 hours/day) Can patient physically be transported by private vehicle: No    Assistance Recommended at Discharge Frequent or constant Supervision/Assistance  Patient can return home with the following  Assistance with cooking/housework;Direct supervision/assist for medications management;Assist for transportation;Direct supervision/assist for financial management;Help with stairs or ramp for entrance;Two people to help with walking and/or transfers;Two people to help with bathing/dressing/bathroom;Assistance with feeding    Equipment Recommendations Other (comment) (next venue of care)  Recommendations for Other Services       Functional Status Assessment Patient has had a recent decline in their functional status and demonstrates the ability to make significant improvements in function in a reasonable and predictable amount of time.     Precautions / Restrictions Precautions Precautions: Fall Precaution Comments: history of dementia Restrictions Weight Bearing Restrictions: No      Mobility  Bed Mobility Overal bed mobility: Needs Assistance Bed Mobility: Supine to Sit, Sit to Supine     Supine to sit: Max assist, HOB elevated Sit to supine: Max assist, +2 for physical assistance   General  bed mobility  comments: Pt unable to follow single step commands, inability to initiate transfer. max multimodal cues throughout. Patient Response: Cooperative, Flat affect  Transfers Overall transfer level: Needs assistance Equipment used: Rolling walker (2 wheels) Transfers: Sit to/from Stand Sit to Stand: Max assist, From elevated surface           General transfer comment: Pt resistive to standing with very heavy posterior bias leading to LE's sliding anteriorly on floor outside of pt's BOS. REquired max multimodal cues for set up with poor carryover.    Ambulation/Gait               General Gait Details: deferred due to inability to safely stand and follow commands  Stairs            Wheelchair Mobility    Modified Rankin (Stroke Patients Only)       Balance Overall balance assessment: Needs assistance Sitting-balance support: No upper extremity supported, Feet supported Sitting balance-Leahy Scale: Fair Sitting balance - Comments: Able to sit unsupported EOB once feet on the floor     Standing balance-Leahy Scale: Zero                               Pertinent Vitals/Pain      Home Living Family/patient expects to be discharged to:: Skilled nursing facility Living Arrangements: Alone                 Additional Comments: Per EMR, pt expected to d/c back to STR and begin process for long term placement. Pt unreliable historian due to baseline dementia.    Prior Function Prior Level of Function : Needs assist             Mobility Comments: pt reports ambulating with RW at STR       Hand Dominance        Extremity/Trunk Assessment   Upper Extremity Assessment Upper Extremity Assessment: Generalized weakness    Lower Extremity Assessment Lower Extremity Assessment: Generalized weakness    Cervical / Trunk Assessment Cervical / Trunk Assessment: Normal  Communication   Communication: Receptive difficulties;Expressive  difficulties  Cognition Arousal/Alertness: Awake/alert Behavior During Therapy: Flat affect Overall Cognitive Status: History of cognitive impairments - at baseline                                 General Comments: Dementia at baseline. Non verbal mostly throughout session. Difficulty following single step commands > 75% of the time. Agreeable to participate but globally resistive to PT assist for bed mobility and standing attempts.        General Comments      Exercises     Assessment/Plan    PT Assessment Patient needs continued PT services  PT Problem List Decreased strength;Decreased mobility;Decreased safety awareness;Decreased activity tolerance;Decreased knowledge of precautions;Decreased balance;Decreased knowledge of use of DME;Decreased cognition       PT Treatment Interventions DME instruction;Therapeutic activities;Gait training;Cognitive remediation;Therapeutic exercise;Patient/family education;Balance training;Functional mobility training;Neuromuscular re-education    PT Goals (Current goals can be found in the Care Plan section)  Acute Rehab PT Goals PT Goal Formulation: Patient unable to participate in goal setting    Frequency Min 2X/week     Co-evaluation               AM-PAC PT "6 Clicks" Mobility  Outcome Measure Help needed turning from  your back to your side while in a flat bed without using bedrails?: A Lot Help needed moving from lying on your back to sitting on the side of a flat bed without using bedrails?: A Lot Help needed moving to and from a bed to a chair (including a wheelchair)?: Total Help needed standing up from a chair using your arms (e.g., wheelchair or bedside chair)?: Total Help needed to walk in hospital room?: Total Help needed climbing 3-5 steps with a railing? : Total 6 Click Score: 8    End of Session Equipment Utilized During Treatment: Gait belt Activity Tolerance: Other (comment) (cognition) Patient  left: in bed;with call bell/phone within reach;with bed alarm set;with nursing/sitter in room Nurse Communication: Mobility status PT Visit Diagnosis: Muscle weakness (generalized) (M62.81);Other abnormalities of gait and mobility (R26.89)    Time: 1353-1411 PT Time Calculation (min) (ACUTE ONLY): 18 min   Charges:   PT Evaluation $PT Eval Moderate Complexity: [place] M. Fairly IV, PT, DPT Physical Therapist- [facility]  12/13/2021, 2:38 PM

## 2021-12-13 NOTE — Progress Notes (Signed)
PROGRESS NOTE    Daniel Knapp  WVP:[id number] DOB: Nov 05, 1950 DOA: 12/11/2021 PCP: Derinda Late, MD    Brief Narrative:  71 year old gentleman with history of dementia, untreated hepatitis C and cirrhosis, hypertension who was recently hospitalized at [facility] 6/15-6/19 with acute blood loss anemia secondary to bleeding gastric varices requiring 4 units of PRBC transfusions for hemoglobin 3.7, unsuccessful hepatic parenchymal tract coil embolization who was discharged to [facility] for short-term skilled rehab brought back to the emergency room with altered mental status.  Patient was found more sleepy and lethargic, agitated at times.  In the emergency room slightly elevated creatinine from baseline otherwise hemodynamically stable.  CT head was nonrevealing.  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  Chest x-ray was clear.   Assessment & Plan:   Acute metabolic encephalopathy in a patient with advanced dementia, suspect secondary to pain and discomfort from ureteric stone Acute kidney injury Hydronephrosis with urinary obstruction due to right renal calculi, bladder mass noted on CT scan with history of prostate cancer:  Mental status has more or less stabilized.  He has advanced dementia. Seen by urology, not recommending any intervention or biopsy.  This is likely local recurrence of prostate cancer. See goal of care discussion below.  Continue supportive care. Renal functions has improved.  Essential hypertension: Blood pressure stable on amlodipine and atenolol.  Dementia: On Aricept Namenda and Depakote.  Recent bleeding gastric varices: Hemoglobin is stable.  Goal of care: Case discussed with urology then palliative care.  Called and updated patient's Sister [name] on the phone.  Patient with advanced dementia, frailty and now with frequent hospitalization and  poor appetite. He will continue to work with PT OT with goal to transition to SNF. We will need goal of care discussion, will benefit with palliation or hospice involvement at the skilled nursing facility.  Currently remains full code. Palliative meeting is scheduled for known tomorrow.   DVT prophylaxis: enoxaparin (LOVENOX) injection 40 mg Start: 12/13/21 2200 SCDs Start: 12/11/21 2309   Code Status: Full code Family Communication: Sister [name] on the phone Disposition Plan: Status is: Inpatient Remains inpatient appropriate because: Unsafe discharge plan     Consultants:  Urology Palliative  Procedures:  None  Antimicrobials:  None   Subjective: Patient seen and examined.  No overnight events.  Pulls out IV lines.  Remains pleasant and confused.  Remains afebrile.  Objective: Vitals:   12/12/21 1552 12/12/21 2112 12/13/21 0550 12/13/21 0813  BP: (!) 142/81 119/75 104/81 131/79  Pulse: 61 63 76 69  Resp: '17 16 18 16  '$ Temp: 97.8 F (36.6 C) 98.6 F (37 C) 97.9 F (36.6 C) 97.6 F (36.4 C)  TempSrc: Axillary   Axillary  SpO2: 100% 100% 100% 100%  Weight:      Height:        Intake/Output Summary (Last 24 hours) at 12/13/2021 1313 Last data filed at 12/13/2021 1033 Gross per 24 hour  Intake 0 ml  Output 300 ml  Net -300 ml   Filed Weights   12/11/21 1625 12/11/21 1627  Weight: 88.9 kg 86.2 kg    Examination:  General: Looks fairly comfortable.  Pleasant but confused and occasionally impulsive.  Alert and awake but not oriented.  Unable to answer questions appropriately. Cardiovascular: S1-S2 normal.  Regular rate rhythm. Respiratory: Bilateral clear. Gastrointestinal: Soft and nontender. Ext: No deformities. Neuro: No focal  deficits.     Data Reviewed: I have personally reviewed following labs and imaging studies  CBC: Recent Labs  Lab 12/11/21 1623 12/12/21 0525  WBC 8.0 5.2  NEUTROABS 5.9  --   HGB 10.1* 9.3*  HCT 35.2* 31.2*  MCV 87.1  82.8  PLT 251 673   Basic Metabolic Panel: Recent Labs  Lab 12/11/21 1623 12/12/21 0525  NA 136 138  K 3.7 3.4*  CL 104 107  CO2 21* 25  GLUCOSE 96 94  BUN 11 9  CREATININE 1.43* 1.03  CALCIUM 9.3 8.9   GFR: Estimated Creatinine Clearance: 76.5 mL/min (by C-G formula based on SCr of 1.03 mg/dL). Liver Function Tests: Recent Labs  Lab 12/11/21 1623  AST 54*  ALT 32  ALKPHOS 104  BILITOT 0.7  PROT 9.0*  ALBUMIN 3.7   No results for input(s): "LIPASE", "AMYLASE" in the last 168 hours. Recent Labs  Lab 12/11/21 2006  AMMONIA 14   Coagulation Profile: Recent Labs  Lab 12/12/21 2243  INR 1.2   Cardiac Enzymes: No results for input(s): "CKTOTAL", "CKMB", "CKMBINDEX", "TROPONINI" in the last 168 hours. BNP (last 3 results) No results for input(s): "PROBNP" in the last 8760 hours. HbA1C: No results for input(s): "HGBA1C" in the last 72 hours. CBG: No results for input(s): "GLUCAP" in the last 168 hours. Lipid Profile: No results for input(s): "CHOL", "HDL", "LDLCALC", "TRIG", "CHOLHDL", "LDLDIRECT" in the last 72 hours. Thyroid Function Tests: No results for input(s): "TSH", "T4TOTAL", "FREET4", "T3FREE", "THYROIDAB" in the last 72 hours. Anemia Panel: No results for input(s): "VITAMINB12", "FOLATE", "FERRITIN", "TIBC", "IRON", "RETICCTPCT" in the last 72 hours. Sepsis Labs: No results for input(s): "PROCALCITON", "LATICACIDVEN" in the last 168 hours.  No results found for this or any previous visit (from the past 240 hour(s)).       Radiology Studies: CT Head Wo Contrast  Result Date: 12/11/2021 CLINICAL DATA:  Altered mental status with history of GI bleed and anemia. EXAM: CT HEAD WITHOUT CONTRAST TECHNIQUE: Contiguous axial images were obtained from the base of the skull through the vertex without intravenous contrast. RADIATION DOSE REDUCTION: This exam was performed according to the departmental dose-optimization program which includes automated  exposure control, adjustment of the mA and/or kV according to patient size and/or use of iterative reconstruction technique. COMPARISON:  December 20, 2021 FINDINGS: Brain: There is moderate severity cerebral atrophy with widening of the extra-axial spaces and ventricular dilatation. There are areas of decreased attenuation within the white matter tracts of the supratentorial brain, consistent with microvascular disease changes. Vascular: No hyperdense vessel or unexpected calcification. Skull: Normal. Negative for fracture or focal lesion. Sinuses/Orbits: No acute finding. 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 IMPRESSION: 1. No acute intracranial abnormality. 2. Generalized cerebral atrophy and microvascular disease changes of the supratentorial brain. Electronically Signed   By: Virgina Norfolk M.D.   On: 12/11/2021 21:34   CT ABDOMEN PELVIS W CONTRAST  Result Date: 12/11/2021 CLINICAL DATA:  History of GI bleed with low oxygen readings at facility. EXAM: CT ABDOMEN AND PELVIS WITH CONTRAST TECHNIQUE: Multidetector CT imaging of the abdomen and pelvis was performed using the standard protocol following bolus administration of intravenous contrast. RADIATION DOSE REDUCTION: This exam was performed according to the departmental dose-optimization program which includes automated exposure control, adjustment of the mA and/or kV according to patient size and/or use of iterative reconstruction technique. CONTRAST:  131m OMNIPAQUE IOHEXOL 300 MG/ML  SOLN COMPARISON:  November 21, 2021 FINDINGS:  Lower chest: Mild atelectasis is seen within the posterior aspects of the bilateral lung bases. Hepatobiliary: Metallic density foci are seen within the posterolateral aspect of the right lobe of the liver. Associated streak artifact is seen. This represents a new finding when compared to the prior study. The gallbladder is normal in appearance, without evidence of gallstones, gallbladder wall thickening or  pericholecystic inflammation. No biliary dilatation is seen Pancreas: Unremarkable. No pancreatic ductal dilatation or surrounding inflammatory changes. Spleen: A predominant stable, 4.0 cm x 3.7 cm area of parenchymal low attenuation is seen within the anterior aspect of the spleen. Adrenals/Urinary Tract: Adrenal glands are unremarkable. Kidneys are normal in size. A stable subcentimeter simple cyst is seen within the posterior aspect of the upper pole of the left kidney. A 4 mm obstructing renal calculus is seen within the proximal right ureter. Mild right-sided hydronephrosis and hydroureter are seen. A 4.1 cm x 3.0 cm x 2.7 cm area of heterogeneous soft tissue attenuation is seen within the anterolateral aspect of the base of the urinary bladder, to the right of midline. Stomach/Bowel: Stomach is within normal limits. Appendix appears normal. No evidence of bowel wall thickening, distention, or inflammatory changes. Noninflamed diverticula are seen throughout the sigmoid colon. Vascular/Lymphatic: Aortic atherosclerosis. There is mild, stable aortocaval lymphadenopathy. Reproductive: The prostate gland is surgically absent. Other: No abdominal wall hernia or abnormality. No abdominopelvic ascites. Musculoskeletal: Multilevel degenerative changes seen throughout the lumbar spine. IMPRESSION: 1. 4 mm obstructing right ureteral calculus. 2. 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 Correlation with cystoscopy is recommended. 3. Sigmoid diverticulosis. 4. Postprocedural changes suspected within the right lobe of the liver. Aortic Atherosclerosis (ICD10-I70.0). Electronically Signed   By: Virgina Norfolk M.D.   On: 12/11/2021 21:32   DG Chest Portable 1 View  Result Date: 12/11/2021 CLINICAL DATA:  Weakness and altered mental status. EXAM: PORTABLE CHEST 1 VIEW COMPARISON:  09/16/2014 FINDINGS: Low lung volumes. Mild bibasilar  atelectasis. No confluent consolidation. No pulmonary edema, pleural effusion, or pneumothorax. No acute osseous abnormalities are seen. IMPRESSION: Low lung volumes with mild bibasilar atelectasis. Electronically Signed   By: Keith Rake M.D.   On: 12/11/2021 20:36        Scheduled Meds:  amLODipine  10 mg Oral Q1500   atenolol  25 mg Oral Daily   cholecalciferol  1,000 Units Oral Daily   divalproex  125 mg Oral Q8H   donepezil  10 mg Oral Q1500   enoxaparin (LOVENOX) injection  40 mg Subcutaneous Q24H   finasteride  5 mg Oral Q1500   memantine  5 mg Oral BID   multivitamin with minerals  1 tablet Oral Q1500   pantoprazole sodium  40 mg Oral BID   Continuous Infusions:   LOS: 2 days    Time spent: 35 minutes    Barb Merino, MD [facility] Pager [phone]

## 2021-12-13 NOTE — TOC Progression Note (Addendum)
Transition of Care [facility]) - Progression Note    Patient Details  Name: ONIE HAYASHI MRN: [id number] Date of Birth: 12/08/1950  Transition of Care [facility]) CM/SW [place], RN Phone Number: 12/13/2021, 12:41 PM  Clinical Narrative:   Hilda Blades at [facility] called and confirmed that the patient can come back and will go back to [facility] while awaiting Long term bed, His insurance approval from previous admission is valid thru today, if he does not DC today then will need a new ins auth started, Palliative is having a family meeting tomorrow and the patient will not DC today, Ins Josem Kaufmann  will be started to go back to [facility]  once PT notes are in to [employer]    Expected Discharge Plan: [place] Barriers to Discharge: Continued Medical Work up  Expected Discharge Plan and Services Expected Discharge Plan: [place]                                               Social Determinants of Health (SDOH) Interventions    Readmission Risk Interventions     No data to display

## 2021-12-13 NOTE — TOC Initial Note (Signed)
Transition of Care [facility]) - Initial/Assessment Note    Patient Details  Name: Daniel Knapp MRN: [id number] Date of Birth: 02/07/1951  Transition of Care [facility]) CM/SW Contact:    Conception Oms, RN Phone Number: 12/13/2021, 10:42 AM  Clinical Narrative:                 Called and left a secure voice mail at [facility] with Hilda Blades to inquire if they would be able to accept him back. I spoke with his sister and Lynden Ang at [phone] She is to have a family meeting with the physicians here at [facility] tomorrow at noon, also working with Palliative care for goals of care  The plan is to return to [facility] she is currently working to get the paper work completed for [place] care there at [facility].        Patient Goals and CMS Choice        Expected Discharge Plan and Services                                                Prior Living Arrangements/Services                       Activities of Daily Living Home Assistive Devices/Equipment: Wheelchair ADL Screening (condition at time of admission) Patient's cognitive ability adequate to safely complete daily activities?: No Is the patient deaf or have difficulty hearing?:  (unsure) Does the patient have difficulty seeing, even when wearing glasses/contacts?:  (unsure) Does the patient have difficulty concentrating, remembering, or making decisions?: Yes Patient able to express need for assistance with ADLs?: No Does the patient have difficulty dressing or bathing?: Yes Independently performs ADLs?: No Communication: Independent Dressing (OT): Needs assistance Is this a change from baseline?: Pre-admission baseline Grooming: Needs assistance Is this a change from baseline?: Pre-admission baseline Feeding: Needs assistance Is this a change from baseline?: Pre-admission baseline Bathing: Needs assistance Is this a change from baseline?: Pre-admission baseline Toileting: Needs  assistance Is this a change from baseline?: Pre-admission baseline In/Out Bed: Needs assistance Is this a change from baseline?: Pre-admission baseline Walks in Home: Needs assistance Is this a change from baseline?: Pre-admission baseline Does the patient have difficulty walking or climbing stairs?: Yes Weakness of Legs: Both Weakness of Arms/Hands: Both  Permission Sought/Granted                  Emotional Assessment              Admission diagnosis:  Kidney stone [N20.0] Bladder tumor [D49.4] Altered mental status, unspecified altered mental status type [Q68.34] Acute metabolic encephalopathy [H96.22] Patient Active Problem List   Diagnosis Date Noted   Acute metabolic encephalopathy 29/79/8921   Bladder mass 12/11/2021   Hydronephrosis with urinary obstruction due to right ureteral calculus 12/11/2021   AKI (acute kidney injury) ([place]) 12/11/2021   Abnormal CT scan, gastrointestinal tract    Iron deficiency anemia 11/23/2021   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/20/21 11/23/2021   Cirrhosis ([place]) 11/23/2021   Upper GI bleed 11/23/2021   Symptomatic anemia 11/20/2021   HTN (hypertension)    Dementia ([place])    Hepatitis    Hypokalemia    Fall    Prostate cancer ([place]) 01/23/2016   BPH (benign prostatic hyperplasia) 08/17/2015  Urinary retention 11/10/2014   BPH (benign prostatic hypertrophy) with urinary retention 11/10/2014   PCP:  Derinda Late, MD Pharmacy:   [place], [place] - [address] [place] [address] Phone: [phone] Fax: [phone]  [id number] [place] [address]Phone: [phone]: [address]Phone: [phone]: [phone]    Social Determinants of Health (SDOH) Interventions    Readmission Risk  Interventions     No data to display

## 2021-12-14 DIAGNOSIS — Z66 Do not resuscitate: Secondary | ICD-10-CM | POA: Diagnosis not present

## 2021-12-14 DIAGNOSIS — G9341 Metabolic encephalopathy: Secondary | ICD-10-CM | POA: Diagnosis not present

## 2021-12-14 DIAGNOSIS — C61 Malignant neoplasm of prostate: Secondary | ICD-10-CM

## 2021-12-14 DIAGNOSIS — R627 Adult failure to thrive: Secondary | ICD-10-CM | POA: Diagnosis not present

## 2021-12-14 DIAGNOSIS — G309 Alzheimer's disease, unspecified: Secondary | ICD-10-CM | POA: Diagnosis not present

## 2021-12-14 LAB — CBC
HCT: 29.4 % — ABNORMAL LOW (ref 39.0–52.0)
Hemoglobin: 8.9 g/dL — ABNORMAL LOW (ref 13.0–17.0)
MCH: 24.8 pg — ABNORMAL LOW (ref 26.0–34.0)
MCHC: 30.3 g/dL (ref 30.0–36.0)
MCV: 81.9 fL (ref 80.0–100.0)
Platelets: 201 10*3/uL (ref 150–400)
RBC: 3.59 MIL/uL — ABNORMAL LOW (ref 4.22–5.81)
RDW: 18.6 % — ABNORMAL HIGH (ref 11.5–15.5)
WBC: 5.4 10*3/uL (ref 4.0–10.5)
nRBC: 0 % (ref 0.0–0.2)

## 2021-12-14 LAB — URINALYSIS, ROUTINE W REFLEX MICROSCOPIC
Bacteria, UA: NONE SEEN
Bilirubin Urine: NEGATIVE
Glucose, UA: NEGATIVE mg/dL
Ketones, ur: NEGATIVE mg/dL
Nitrite: NEGATIVE
Protein, ur: 100 mg/dL — AB
RBC / HPF: >50 RBC/hpf — ABNORMAL HIGH (ref 0–5)
Specific Gravity, Urine: 1.021 (ref 1.005–1.030)
WBC, UA: >50 WBC/hpf — ABNORMAL HIGH (ref 0–5)
pH: 5 (ref 5.0–8.0)

## 2021-12-14 LAB — TYPE AND SCREEN
ABO/RH(D): O POS
Antibody Screen: NEGATIVE

## 2021-12-14 NOTE — Progress Notes (Signed)
PROGRESS NOTE    Daniel Knapp  ZOX:[id number] DOB: Aug 26, 1950 DOA: 12/11/2021 PCP: Derinda Late, MD    Brief Narrative:  71 year old gentleman with history of dementia, untreated hepatitis C and cirrhosis, hypertension who was recently hospitalized at [facility] 6/15-6/19 with acute blood loss anemia secondary to bleeding gastric varices requiring 4 units of PRBC transfusions for hemoglobin 3.7, unsuccessful hepatic parenchymal tract coil embolization who was discharged to [facility] for short-term skilled rehab brought back to the emergency room with altered mental status.  Patient was found more sleepy and lethargic, agitated at times.  In the emergency room slightly elevated creatinine from baseline otherwise hemodynamically stable.  CT head was nonrevealing.  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  Chest x-ray was clear.   Assessment & Plan:   Acute metabolic encephalopathy in a patient with advanced dementia, suspect secondary to pain and discomfort from ureteric stone Acute kidney injury with unknown baseline. Hydronephrosis with urinary obstruction due to right renal calculi, bladder mass noted on CT scan with history of prostate cancer:  Mental status has more or less stabilized.  He has advanced dementia. Seen by urology, not recommending any intervention or biopsy.  This is likely local recurrence of prostate cancer. See goal of care discussion below.  Continue supportive care. Renal functions has improved.  Essential hypertension: Blood pressure stable on amlodipine and atenolol.  Remains low blood pressures at times. Discontinue amlodipine.  Atenolol with holding parameters.  Dementia: On Aricept Namenda and Depakote.  Recent bleeding gastric varices: Hemoglobin is stable.  Goal of care: Case discussed with urology then palliative care.  7/5, called and  updated patient's Sister [name] on the phone.  Patient with advanced dementia, frailty and now with frequent hospitalization and poor appetite. He will continue to work with PT OT with goal to transition to SNF. We will need goal of care discussion, will benefit with palliation or hospice involvement at the skilled nursing facility.  Currently remains full code. Palliative meeting is scheduled for today. We will transfer to a SNF after goal of care discussion today.   DVT prophylaxis: enoxaparin (LOVENOX) injection 40 mg Start: 12/13/21 2200 SCDs Start: 12/11/21 2309   Code Status: Full code Family Communication: Family meeting scheduled for the afternoon. Disposition Plan: Status is: Inpatient Remains inpatient appropriate because: Unsafe discharge plan     Consultants:  Urology Palliative  Procedures:  None  Antimicrobials:  None   Subjective:  Patient seen and examined.  No overnight events.  Pleasant and confused. Nursing reported that he can eat if assisted. Remains pleasantly confused but without behavioral disturbances or agitation.  Objective: Vitals:   12/13/21 1556 12/13/21 2038 12/14/21 0510 12/14/21 0824  BP: 108/66 94/72 (!) 84/63 (!) 94/56  Pulse: 62 78 78 73  Resp: '17 20 18 17  '$ Temp: 98.9 F (37.2 C) 97.6 F (36.4 C) (!) 97.4 F (36.3 C) 98.8 F (37.1 C)  TempSrc:  Oral  Axillary  SpO2: 100% 100% 97% 99%  Weight:      Height:        Intake/Output Summary (Last 24 hours) at 12/14/2021 1032 Last data filed at 12/13/2021 2200 Gross per 24 hour  Intake 240 ml  Output --  Net 240 ml   Filed Weights   12/11/21 1625 12/11/21 1627  Weight: 88.9 kg 86.2 kg    Examination:  General: Looks fairly comfortable.  Pleasant but confused . Alert and awake but not oriented.  Unable to answer questions appropriately. Cardiovascular: S1-S2 normal.  Regular rate rhythm. Respiratory: Bilateral clear. Gastrointestinal: Soft and nontender. Ext: No  deformities. Neuro: No focal deficits.     Data Reviewed: I have personally reviewed following labs and imaging studies  CBC: Recent Labs  Lab 12/11/21 1623 12/12/21 0525 12/14/21 0557  WBC 8.0 5.2 5.4  NEUTROABS 5.9  --   --   HGB 10.1* 9.3* 8.9*  HCT 35.2* 31.2* 29.4*  MCV 87.1 82.8 81.9  PLT 251 196 621   Basic Metabolic Panel: Recent Labs  Lab 12/11/21 1623 12/12/21 0525  NA 136 138  K 3.7 3.4*  CL 104 107  CO2 21* 25  GLUCOSE 96 94  BUN 11 9  CREATININE 1.43* 1.03  CALCIUM 9.3 8.9   GFR: Estimated Creatinine Clearance: 76.5 mL/min (by C-G formula based on SCr of 1.03 mg/dL). Liver Function Tests: Recent Labs  Lab 12/11/21 1623  AST 54*  ALT 32  ALKPHOS 104  BILITOT 0.7  PROT 9.0*  ALBUMIN 3.7   No results for input(s): "LIPASE", "AMYLASE" in the last 168 hours. Recent Labs  Lab 12/11/21 2006  AMMONIA 14   Coagulation Profile: Recent Labs  Lab 12/12/21 2243  INR 1.2   Cardiac Enzymes: No results for input(s): "CKTOTAL", "CKMB", "CKMBINDEX", "TROPONINI" in the last 168 hours. BNP (last 3 results) No results for input(s): "PROBNP" in the last 8760 hours. HbA1C: No results for input(s): "HGBA1C" in the last 72 hours. CBG: No results for input(s): "GLUCAP" in the last 168 hours. Lipid Profile: No results for input(s): "CHOL", "HDL", "LDLCALC", "TRIG", "CHOLHDL", "LDLDIRECT" in the last 72 hours. Thyroid Function Tests: No results for input(s): "TSH", "T4TOTAL", "FREET4", "T3FREE", "THYROIDAB" in the last 72 hours. Anemia Panel: No results for input(s): "VITAMINB12", "FOLATE", "FERRITIN", "TIBC", "IRON", "RETICCTPCT" in the last 72 hours. Sepsis Labs: No results for input(s): "PROCALCITON", "LATICACIDVEN" in the last 168 hours.  No results found for this or any previous visit (from the past 240 hour(s)).       Radiology Studies: No results found.      Scheduled Meds:  atenolol  25 mg Oral Daily   cholecalciferol  1,000 Units  Oral Daily   divalproex  125 mg Oral Q8H   donepezil  10 mg Oral Q1500   enoxaparin (LOVENOX) injection  40 mg Subcutaneous Q24H   finasteride  5 mg Oral Q1500   memantine  5 mg Oral BID   multivitamin with minerals  1 tablet Oral Q1500   pantoprazole sodium  40 mg Oral BID   Continuous Infusions:   LOS: 3 days    Time spent: 35 minutes    Barb Merino, MD [facility] Pager [phone]

## 2021-12-14 NOTE — Progress Notes (Addendum)
   Participated in family meeting with patient's 2 sisters as well as Wadie Lessen from palliative care.  From a urology perspective, we focused on his likely metastatic prostate cancer with recent PSA of 222 as well as lymphadenopathy on recent CT scan, as well as the 4 mm right ureteral stone.  Regarding the ureteral stone, urinalysis still has not been able to send secondary to patient's baseline confusion and intermittent agitation, and concern for injury to nursing staff with attempted straight catheterization.  He has no leukocytosis or fever that would indicate an infected stone though.  We discussed a 80 to 90% spontaneous passage rate based on the stone size, and we could always consider repeating a CT scan or KUB in the future to confirm stone passage.  Any intervention for kidney stone would have risk for bleeding, stent related symptoms, general anesthesia risks especially in the setting of his cirrhosis, and stirring up bleeding from suspected local recurrence of prostate cancer.  Regarding suspected metastatic prostate cancer, we discussed that this is typically managed by oncology, but all systemic treatments like hormone therapy(ADT) or chemotherapy would likely have a significant impact on his quality of life with little long-term benefit.  We discussed that metastatic prostate cancer is not curable.  PSA doubling time is also <12 months indicating more aggressive disease.  The lesion seen at the base of the bladder on CT is almost certainly recurrence of prostate cancer but could also represent a bladder tumor, though this is much less likely.  Would only be able to differentiate this with cystoscopy and biopsy, which again would require anesthesia and those risks.  Family is in agreement to pursue a more palliative approach and focus on patient comfort.  From a urology perspective would strongly recommend avoiding a Foley catheter unless absolutely necessary with elevated bladder scan and  lower abdominal pain, as patient has extremely high risk for traumatic Foley removal as well as bleeding.  We are happy to see him in follow-up if other questions arise.  I spent a total of 55 minutes on the floor with greater than 50% spent with the patient and his family regarding counseling and coordination of care regarding kidney stone with no clinical evidence of infection and likely metastatic prostate cancer with treatment goals and a more palliative approach.   Billey Co, MD

## 2021-12-14 NOTE — TOC Progression Note (Signed)
Transition of Care [facility]) - Progression Note    Patient Details  Name: Daniel Knapp MRN: [id number] Date of Birth: [birth year]  Transition of Care [facility]) CM/SW Contact  Laurena Slimmer, RN Phone Number: 12/14/2021, 12:56 PM  Clinical Narrative:    [place] initiated and pending.    Expected Discharge Plan: Skilled Nursing Facility Barriers to Discharge: Continued Medical Work up  Expected Discharge Plan and Services Expected Discharge Plan: [place]                                               Social Determinants of Health (SDOH) Interventions    Readmission Risk Interventions     No data to display

## 2021-12-14 NOTE — Progress Notes (Signed)
Patient ID: Daniel Knapp, male   DOB: January 06, 1951, 71 y.o.   MRN: [id number]    Progress Note from the Palliative Medicine Team at [facility]   Patient Name: Daniel Knapp        Date: 12/14/2021 DOB: Aug 15, 1950  Age: 71 y.o. MRN#: [id number] Attending Physician: Barb Merino, MD Primary Care Physician: Derinda Late, MD Admit Date: 12/11/2021   Medical records reviewed    71 y.o. male   admitted on 12/11/2021 with past medical history significant for dementia, untreated hepatitis C, cirrhosis/ETOH, likely metastatic prostate cancer with recent PSA of 222 as well as lymphadenopathy on recent CT scan, as well as the 4 mm right ureteral stone., hypertension, recently hospitalized from 6/15 to 6/19 with acute blood loss anemia secondary to bleeding gastric varies  requiring 4 units PRBCs for hemoglobin 3.7, undergoing unsuccessful hepatic parenchymal tract coil embolization by IR on 6/17 who was brought to the ED by EMS for evaluation of altered mental status.    Patient is lethargic, difficult to arouse minimal interaction with staff and family .  Family face treatment option decisions, advanced directive decisions and anticipatory care needs.    This NP assessed the patient at the bedside, and met with his 2 sisters as scheduled for continued conversation regarding current medical situation. Education offered on patient's multiple comorbidities.  Education offered specifically to the natural trajectory and expectations of end-stage dementia as a terminal diagnosis.   Education offered on concept specific to adult failure to thrive and the limitations of medical interventions to prolong quality of life when the body fails to thrive.  We explored human mortality.  Dr. 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  Family understand the seriousness of the patient's current medical situation.  For now they would like him transition back to [facility] for  rehab, with the transition to hospice when ready   Plan of Care: - DNR/DNI- documented today, and MOST form completed - No artificial feeding now or in the future - No further work-up or treatment for likely prostate cancer - transition to SNF for rehab with palliative services with a shift to hospice when family is ready  - Outpatient palliative services on discharge to facility   Education offered today regarding  the importance of continued conversation with family and their  medical providers regarding overall plan of care and treatment options,  ensuring decisions are within the context of the patients values and GOCs.  Questions and concerns addressed   Discussed with Dr Bobette Mo NP  Palliative Medicine Team Team Phone # [phone] Pager [phone]

## 2021-12-15 DIAGNOSIS — G9341 Metabolic encephalopathy: Secondary | ICD-10-CM | POA: Diagnosis not present

## 2021-12-15 NOTE — Progress Notes (Signed)
PROGRESS NOTE    Daniel Knapp  KNL:[id number] DOB: 1950-12-31 DOA: 12/11/2021 PCP: Derinda Late, MD    Brief Narrative:  71 year old gentleman with history of dementia, untreated hepatitis C and cirrhosis, hypertension who was recently hospitalized at [facility] 6/15-6/19 with acute blood loss anemia secondary to bleeding gastric varices requiring 4 units of PRBC transfusions for hemoglobin 3.7, unsuccessful hepatic parenchymal tract coil embolization who was discharged to [facility] for short-term skilled rehab brought back to the emergency room with altered mental status.  Patient was found more sleepy and lethargic, agitated at times.  In the emergency room slightly elevated creatinine from baseline otherwise hemodynamically stable.  CT head was nonrevealing.  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  Chest x-ray was clear.   Assessment & Plan:   Acute metabolic encephalopathy in a patient with advanced dementia, suspect secondary to pain and discomfort from ureteric stone Acute kidney injury with unknown baseline. Hydronephrosis with urinary obstruction due to right renal calculi, bladder mass noted on CT scan with history of prostate cancer:  Mental status has more or less stabilized.  He has advanced dementia. Seen by urology, not recommending any intervention or biopsy.  This is likely local recurrence of prostate cancer. See goal of care discussion below.  Continue supportive care. Renal functions has improved.  Essential hypertension: Blood pressure stable on amlodipine and atenolol.  Remains low blood pressures at times. Discontinue amlodipine.  Atenolol with holding parameters.  Dementia: On Aricept Namenda and Depakote.  Recent bleeding gastric varices: Hemoglobin is stable.  Goal of care: Advanced dementia and failure to thrive. Working with PT OT, plan  to transfer to a SNF. Continue palliative care follow-up for goal of care, progression of disease and hopefully hospice in the future.   DVT prophylaxis: enoxaparin (LOVENOX) injection 40 mg Start: 12/13/21 2200 SCDs Start: 12/11/21 2309   Code Status: DNR/DNI Family Communication: None today. Disposition Plan: Status is: Inpatient Remains inpatient appropriate because: Waiting for SNF bed.     Consultants:  Urology Palliative  Procedures:  None  Antimicrobials:  None   Subjective:  Seen and examined.  No overnight events.  Pleasantly confused.  Objective: Vitals:   12/14/21 1628 12/14/21 2020 12/15/21 0348 12/15/21 0837  BP: (!) 99/55 112/71 104/61 120/80  Pulse: 64 62 72 81  Resp: '17 16 16 18  '$ Temp: 98.6 F (37 C) 98.2 F (36.8 C) 98.5 F (36.9 C) 98 F (36.7 C)  TempSrc: Axillary     SpO2: 100% 100% 100% 99%  Weight:      Height:        Intake/Output Summary (Last 24 hours) at 12/15/2021 1156 Last data filed at 12/14/2021 1813 Gross per 24 hour  Intake --  Output 350 ml  Net -350 ml   Filed Weights   12/11/21 1625 12/11/21 1627  Weight: 88.9 kg 86.2 kg    Examination:  General: Looks fairly comfortable.  Quiet and calm.  Unable to answer questions appropriately. Cardiovascular: S1-S2 normal.  Regular rate rhythm. Respiratory: Bilateral clear. Gastrointestinal: Soft and nontender. Ext: No deformities. Neuro: No focal deficits.     Data Reviewed: I have personally reviewed following labs and imaging studies  CBC: Recent Labs  Lab 12/11/21 1623 12/12/21 0525 12/14/21 0557  WBC 8.0 5.2 5.4  NEUTROABS 5.9  --   --   HGB 10.1* 9.3* 8.9*  HCT 35.2*  31.2* 29.4*  MCV 87.1 82.8 81.9  PLT 251 196 419   Basic Metabolic Panel: Recent Labs  Lab 12/11/21 1623 12/12/21 0525  NA 136 138  K 3.7 3.4*  CL 104 107  CO2 21* 25  GLUCOSE 96 94  BUN 11 9  CREATININE 1.43* 1.03  CALCIUM 9.3 8.9   GFR: Estimated Creatinine Clearance: 76.5  mL/min (by C-G formula based on SCr of 1.03 mg/dL). Liver Function Tests: Recent Labs  Lab 12/11/21 1623  AST 54*  ALT 32  ALKPHOS 104  BILITOT 0.7  PROT 9.0*  ALBUMIN 3.7   No results for input(s): "LIPASE", "AMYLASE" in the last 168 hours. Recent Labs  Lab 12/11/21 2006  AMMONIA 14   Coagulation Profile: Recent Labs  Lab 12/12/21 2243  INR 1.2   Cardiac Enzymes: No results for input(s): "CKTOTAL", "CKMB", "CKMBINDEX", "TROPONINI" in the last 168 hours. BNP (last 3 results) No results for input(s): "PROBNP" in the last 8760 hours. HbA1C: No results for input(s): "HGBA1C" in the last 72 hours. CBG: No results for input(s): "GLUCAP" in the last 168 hours. Lipid Profile: No results for input(s): "CHOL", "HDL", "LDLCALC", "TRIG", "CHOLHDL", "LDLDIRECT" in the last 72 hours. Thyroid Function Tests: No results for input(s): "TSH", "T4TOTAL", "FREET4", "T3FREE", "THYROIDAB" in the last 72 hours. Anemia Panel: No results for input(s): "VITAMINB12", "FOLATE", "FERRITIN", "TIBC", "IRON", "RETICCTPCT" in the last 72 hours. Sepsis Labs: No results for input(s): "PROCALCITON", "LATICACIDVEN" in the last 168 hours.  No results found for this or any previous visit (from the past 240 hour(s)).       Radiology Studies: No results found.      Scheduled Meds:  atenolol  25 mg Oral Daily   cholecalciferol  1,000 Units Oral Daily   divalproex  125 mg Oral Q8H   donepezil  10 mg Oral Q1500   enoxaparin (LOVENOX) injection  40 mg Subcutaneous Q24H   finasteride  5 mg Oral Q1500   memantine  5 mg Oral BID   multivitamin with minerals  1 tablet Oral Q1500   pantoprazole sodium  40 mg Oral BID   Continuous Infusions:   LOS: 4 days    Time spent: 35 minutes    Barb Merino, MD [facility] Pager [phone]

## 2021-12-15 NOTE — Progress Notes (Signed)
Physical Therapy Treatment Patient Details Name: Daniel Knapp MRN: [id number] DOB: [birth year] Today's Date: 12/15/2021   History of Present Illness Daniel Knapp is a 71 y.o. male with medical history significant for Dementia, untreated hepatitis C, cirrhosis, hypertension, recently hospitalized from 6/15 to 6/19 with acute blood loss anemia secondary to bleeding gastric varix, requiring 4 units PRBCs for hemoglobin 3.7, undergoing unsuccessful hepatic parenchymal tract coil embolization by IR on 6/17 who was brought to the ED by EMS for evaluation of altered mental status.    PT Comments    Pt received upright in bed. Was initially more verbal today saying 2-3 word sentences appropriately in response to PT conversation. As session progresses pt becomes more limited in verbal responses.  Pt responds "yes" to attempting mobility with PT. Session remains significantly limited due to pt's cognition due to dementia. Attempts made on exiting bed on R side but pt significantly resistive with bed mobility. Trialed then on exiting L side of bed with pt remaining resistive, but initiates LE's towards EOB with hands on cuing from PT. Still relying on bed features and maxA to safely sit EOB. Initial posterior bias in sitting that improves once feet are on floor with pt demonstrating adequate static sitting balance. Pt perseverating on itching back and legs unable to follow single step commands today on LE therex and weight shifting/scooting exercises. Due to 100% inability to follow commands standing attempts deferred. Pt relying on maxA to return to supine. Pt remains gripping bed rail with LUE requiring significant encouragement and redirecting to remove LUE for safe transfer in supine all together. Based off of pt's resistance to mobility, pt is very strong in UE's and LE's but appears limited in participation due to cognition. Pt left in bed with all needs in reach and falls precautions in place. LE's  offloaded with pillows. D/c recs remain appropriate.    Recommendations for follow up therapy are one component of a multi-disciplinary discharge planning process, led by the attending physician.  Recommendations may be updated based on patient status, additional functional criteria and insurance authorization.  Follow Up Recommendations  Skilled nursing-short term rehab (<3 hours/day) Can patient physically be transported by private vehicle: No   Assistance Recommended at Discharge Frequent or constant Supervision/Assistance  Patient can return home with the following Assistance with cooking/housework;Direct supervision/assist for medications management;Assist for transportation;Direct supervision/assist for financial management;Help with stairs or ramp for entrance;Two people to help with walking and/or transfers;Two people to help with bathing/dressing/bathroom;Assistance with feeding   Equipment Recommendations  Other (comment) (tbd by next venue of care)    Recommendations for Other Services       Precautions / Restrictions Precautions Precautions: Fall Precaution Comments: history of dementia Restrictions Weight Bearing Restrictions: No     Mobility  Bed Mobility Overal bed mobility: Needs Assistance Bed Mobility: Supine to Sit, Sit to Supine     Supine to sit: Max assist, HOB elevated Sit to supine: Max assist   General bed mobility comments: Trialed sitting to R side of bed. Pt resistive with LE movements to initiate transfer. ABle to iniate LE's going to L side of bed. Still reliant on max multimodal cues and hand over hand guidance to participate. Patient Response: Cooperative, Flat affect  Transfers                   General transfer comment: deferred STS due to inability to follow any commands today.    Ambulation/Gait  General Gait Details: deferred due to inability to safely stand and follow commands   Stairs              Wheelchair Mobility    Modified Rankin (Stroke Patients Only)       Balance Overall balance assessment: Needs assistance Sitting-balance support: No upper extremity supported, Feet supported Sitting balance-Leahy Scale: Fair Sitting balance - Comments: Able to sit unsupported EOB once feet on the floor                                    Cognition Arousal/Alertness: Awake/alert Behavior During Therapy: Flat affect Overall Cognitive Status: History of cognitive impairments - at baseline                                 General Comments: Dementia at baseline. Non verbal mostly throughout session. Difficulty following single step commands > 75% of the time. Agreeable to participate but globally resistive to PT assist for bed mobility and standing attempts.        Exercises General Exercises - Lower Extremity Long Arc Quad: AAROM, Both, 10 reps, Seated    General Comments General comments (skin integrity, edema, etc.): remains distracted in sitting with itching back and legs.      Pertinent Vitals/Pain Pain Assessment Pain Assessment: Faces Faces Pain Scale: No hurt    Home Living                          Prior Function            PT Goals (current goals can now be found in the care plan section) Acute Rehab PT Goals PT Goal Formulation: Patient unable to participate in goal setting    Frequency    Min 2X/week      PT Plan Current plan remains appropriate    Co-evaluation              AM-PAC PT "6 Clicks" Mobility   Outcome Measure  Help needed turning from your back to your side while in a flat bed without using bedrails?: A Lot Help needed moving from lying on your back to sitting on the side of a flat bed without using bedrails?: A Lot Help needed moving to and from a bed to a chair (including a wheelchair)?: Total Help needed standing up from a chair using your arms (e.g., wheelchair or bedside chair)?:  Total Help needed to walk in hospital room?: Total Help needed climbing 3-5 steps with a railing? : Total 6 Click Score: 8    End of Session   Activity Tolerance: Other (comment) (session limited due to cognition from dementia.)   Nurse Communication: Mobility status PT Visit Diagnosis: Muscle weakness (generalized) (M62.81);Other abnormalities of gait and mobility (R26.89)     Time: 9833-8250 PT Time Calculation (min) (ACUTE ONLY): 28 min  Charges:  $Therapeutic Activity: 8-22 mins                    Caterra Ostroff M. Fairly IV, PT, DPT Physical Therapist- [facility]  12/15/2021, 11:03 AM

## 2021-12-15 NOTE — Care Management Important Message (Signed)
Important Message  Patient Details  Name: Daniel Knapp MRN: [id number] Date of Birth: [birth year]   Medicare Important Message Given:  Yes     [name] A Joab Carden 12/15/2021, 2:45 PM

## 2021-12-16 DIAGNOSIS — G9341 Metabolic encephalopathy: Secondary | ICD-10-CM | POA: Diagnosis not present

## 2021-12-16 NOTE — TOC Progression Note (Signed)
Transition of Care [facility]) - Progression Note    Patient Details  Name: Daniel Knapp MRN: [id number] Date of Birth: September 23, 1950  Transition [name]) CM/SW Contact  Izola Price, RN Phone Number: 12/16/2021, 2:57 PM  Clinical Narrative: 3 pm. Jake Samples TM#[id number]. Submitted 7/6. Approved 12/14/21. Good through 12/18/21. Updated provider. Left VM for Neoma Laming Meyers/AC at [facility]. Simmie Davies RN CM     Expected Discharge Plan: Skilled Nursing Facility Barriers to Discharge: Continued Medical Work up  Expected Discharge Plan and Services Expected Discharge Plan: [place]                                               Social Determinants of Health (SDOH) Interventions    Readmission Risk Interventions     No data to display

## 2021-12-16 NOTE — Progress Notes (Signed)
PROGRESS NOTE    Daniel Knapp  LOV:[id number] DOB: 06-05-1951 DOA: 12/11/2021 PCP: Derinda Late, MD    Brief Narrative:  71 year old gentleman with history of dementia, untreated hepatitis C and cirrhosis, hypertension who was recently hospitalized at [facility] 6/15-6/19 with acute blood loss anemia secondary to bleeding gastric varices requiring 4 units of PRBC transfusions for hemoglobin 3.7, unsuccessful hepatic parenchymal tract coil embolization who was discharged to [facility] for short-term skilled rehab brought back to the emergency room with altered mental status.  Patient was found more sleepy and lethargic, agitated at times.  In the emergency room slightly elevated creatinine from baseline otherwise hemodynamically stable.  CT head was nonrevealing.  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  Chest x-ray was clear.  Remains in the hospital.  Waiting for insurance prior Auth.   Assessment & Plan:   Acute metabolic encephalopathy in a patient with advanced dementia, suspect secondary to pain and discomfort from ureteric stone Acute kidney injury with unknown baseline. Hydronephrosis with urinary obstruction due to right renal calculi, bladder mass noted on CT scan with history of prostate cancer:  Mental status has more or less stabilized.  He has advanced dementia. Seen by urology, not recommending any intervention or biopsy.  This is likely local recurrence of prostate cancer. See goal of care discussion below.  Continue supportive care. Renal functions has improved.  Essential hypertension: Blood pressure stable on amlodipine and atenolol.  Remains low blood pressures at times. Discontinue amlodipine.  Atenolol with holding parameters.  Dementia: On Aricept Namenda and Depakote.  Recent bleeding gastric varices: Hemoglobin is stable.  Goal of  care: Advanced dementia and failure to thrive. Working with PT OT, plan to transfer to a SNF. Continue palliative care follow-up for goal of care, progression of disease and hopefully hospice in the future.   DVT prophylaxis: enoxaparin (LOVENOX) injection 40 mg Start: 12/13/21 2200 SCDs Start: 12/11/21 2309   Code Status: DNR/DNI Family Communication: None today. Disposition Plan: Status is: Inpatient Remains inpatient appropriate because: Waiting for SNF bed.     Consultants:  Urology Palliative  Procedures:  None  Antimicrobials:  None   Subjective:  Pleasant.  Confused.  No new events.  Objective: Vitals:   12/15/21 1730 12/15/21 1953 12/16/21 0651 12/16/21 0910  BP: 128/80 127/87 124/86 132/77  Pulse: 66 64 70 68  Resp: '18 16 18 16  '$ Temp: 98.7 F (37.1 C) 98.8 F (37.1 C) 98.5 F (36.9 C) 98.7 F (37.1 C)  TempSrc:      SpO2: 100% 100% 100% 100%  Weight:      Height:        Intake/Output Summary (Last 24 hours) at 12/16/2021 1254 Last data filed at 12/16/2021 0910 Gross per 24 hour  Intake --  Output 950 ml  Net -950 ml    Filed Weights   12/11/21 1625 12/11/21 1627  Weight: 88.9 kg 86.2 kg    Examination:   Comfortable.  Quiet and calm.  Pleasant.  Denies any complaints.  Confused.     Data Reviewed: I have personally reviewed following labs and imaging studies  CBC: Recent Labs  Lab 12/11/21 1623 12/12/21 0525 12/14/21 0557  WBC 8.0 5.2 5.4  NEUTROABS 5.9  --   --   HGB 10.1* 9.3* 8.9*  HCT 35.2* 31.2* 29.4*  MCV 87.1 82.8 81.9  PLT 251 196 201  Basic Metabolic Panel: Recent Labs  Lab 12/11/21 1623 12/12/21 0525  NA 136 138  K 3.7 3.4*  CL 104 107  CO2 21* 25  GLUCOSE 96 94  BUN 11 9  CREATININE 1.43* 1.03  CALCIUM 9.3 8.9    GFR: Estimated Creatinine Clearance: 76.5 mL/min (by C-G formula based on SCr of 1.03 mg/dL). Liver Function Tests: Recent Labs  Lab 12/11/21 1623  AST 54*  ALT 32  ALKPHOS 104   BILITOT 0.7  PROT 9.0*  ALBUMIN 3.7    No results for input(s): "LIPASE", "AMYLASE" in the last 168 hours. Recent Labs  Lab 12/11/21 2006  AMMONIA 14    Coagulation Profile: Recent Labs  Lab 12/12/21 2243  INR 1.2    Cardiac Enzymes: No results for input(s): "CKTOTAL", "CKMB", "CKMBINDEX", "TROPONINI" in the last 168 hours. BNP (last 3 results) No results for input(s): "PROBNP" in the last 8760 hours. HbA1C: No results for input(s): "HGBA1C" in the last 72 hours. CBG: No results for input(s): "GLUCAP" in the last 168 hours. Lipid Profile: No results for input(s): "CHOL", "HDL", "LDLCALC", "TRIG", "CHOLHDL", "LDLDIRECT" in the last 72 hours. Thyroid Function Tests: No results for input(s): "TSH", "T4TOTAL", "FREET4", "T3FREE", "THYROIDAB" in the last 72 hours. Anemia Panel: No results for input(s): "VITAMINB12", "FOLATE", "FERRITIN", "TIBC", "IRON", "RETICCTPCT" in the last 72 hours. Sepsis Labs: No results for input(s): "PROCALCITON", "LATICACIDVEN" in the last 168 hours.  No results found for this or any previous visit (from the past 240 hour(s)).       Radiology Studies: No results found.      Scheduled Meds:  atenolol  25 mg Oral Daily   cholecalciferol  1,000 Units Oral Daily   divalproex  125 mg Oral Q8H   donepezil  10 mg Oral Q1500   enoxaparin (LOVENOX) injection  40 mg Subcutaneous Q24H   finasteride  5 mg Oral Q1500   memantine  5 mg Oral BID   multivitamin with minerals  1 tablet Oral Q1500   pantoprazole sodium  40 mg Oral BID   Continuous Infusions:   LOS: 5 days    Time spent: 25 minutes     Barb Merino, MD [facility] Pager [phone]

## 2021-12-17 DIAGNOSIS — G9341 Metabolic encephalopathy: Secondary | ICD-10-CM | POA: Diagnosis not present

## 2021-12-17 NOTE — Plan of Care (Signed)

## 2021-12-17 NOTE — Progress Notes (Signed)
PROGRESS NOTE    Daniel ALEXA  Knapp DOB: 1951-02-16 DOA: 12/11/2021 PCP: Derinda Late, MD    Brief Narrative:  71 year old gentleman with history of dementia, untreated hepatitis C and cirrhosis, hypertension who was recently hospitalized at [facility] 6/15-6/19 with acute blood loss anemia secondary to bleeding gastric varices requiring 4 units of PRBC transfusions for hemoglobin 3.7, unsuccessful hepatic parenchymal tract coil embolization who was discharged to [facility] for short-term skilled rehab brought back to the emergency room with altered mental status.  Patient was found more sleepy and lethargic, agitated at times.  In the emergency room slightly elevated creatinine from baseline otherwise hemodynamically stable.  CT head was nonrevealing.  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  Chest x-ray was clear.  Patient is stable for discharge to a skilled nursing facility.  Insurance prior [employer]   Discharge whenever nursing home is able to accept.   Assessment & Plan:   Acute metabolic encephalopathy in a patient with advanced dementia, suspect secondary to pain and discomfort from ureteric stone Acute kidney injury with unknown baseline. Hydronephrosis with urinary obstruction due to right renal calculi, bladder mass noted on CT scan with history of prostate cancer:  Mental status has more or less stabilized.  He has advanced dementia. Seen by urology, not recommending any intervention or biopsy.  This is likely local recurrence of prostate cancer. See goal of care discussion below.  Continue supportive care. Renal functions has improved.  Essential hypertension: Blood pressure stable on amlodipine and atenolol.  Remains low blood pressures at times. Discontinue amlodipine.  Atenolol with holding parameters.  Dementia: On Aricept Namenda and  Depakote.  Recent bleeding gastric varices: Hemoglobin is stable.  Goal of care: Advanced dementia and failure to thrive. Working with PT OT, plan to transfer to a SNF. Continue palliative care follow-up for goal of care, progression of disease and hopefully hospice in the future.   DVT prophylaxis: enoxaparin (LOVENOX) injection 40 mg Start: 12/13/21 2200 SCDs Start: 12/11/21 2309   Code Status: DNR/DNI Family Communication: None today. Disposition Plan: Status is: Inpatient Remains inpatient appropriate because: Waiting for SNF.  Stable.     Consultants:  Urology Palliative  Procedures:  None  Antimicrobials:  None   Subjective:  Pleasantly confused.  No new events. Ate about 75% of his breakfast with assist.  Objective: Vitals:   12/16/21 1801 12/16/21 2217 12/17/21 0541 12/17/21 0814  BP: 103/70 130/75 104/73 120/68  Pulse: 63 (!) 59  76  Resp: '15 17 17 16  '$ Temp: 98.6 F (37 C) 98.2 F (36.8 C) 98.5 F (36.9 C) 97.8 F (36.6 C)  TempSrc:  Oral Oral Oral  SpO2: 100% 100% 99% 100%  Weight:      Height:        Intake/Output Summary (Last 24 hours) at 12/17/2021 1204 Last data filed at 12/16/2021 2240 Gross per 24 hour  Intake --  Output 400 ml  Net -400 ml    Filed Weights   12/11/21 1625 12/11/21 1627  Weight: 88.9 kg 86.2 kg    Examination:  Calm comfortable.  Pleasantly confused.     Data Reviewed: I have personally reviewed following labs and imaging studies  CBC: Recent Labs  Lab 12/11/21 1623 12/12/21 0525 12/14/21 0557  WBC 8.0 5.2 5.4  NEUTROABS 5.9  --   --   HGB 10.1* 9.3* 8.9*  HCT 35.2* 31.2* 29.4*  MCV 87.1 82.8 81.9  PLT 251 196 706    Basic Metabolic Panel: Recent Labs  Lab 12/11/21 1623 12/12/21 0525  NA 136 138  K 3.7 3.4*  CL 104 107  CO2 21* 25  GLUCOSE 96 94  BUN 11 9  CREATININE 1.43* 1.03  CALCIUM 9.3 8.9    GFR: Estimated Creatinine Clearance: 76.5 mL/min (by C-G formula based on SCr of 1.03  mg/dL). Liver Function Tests: Recent Labs  Lab 12/11/21 1623  AST 54*  ALT 32  ALKPHOS 104  BILITOT 0.7  PROT 9.0*  ALBUMIN 3.7    No results for input(s): "LIPASE", "AMYLASE" in the last 168 hours. Recent Labs  Lab 12/11/21 2006  AMMONIA 14    Coagulation Profile: Recent Labs  Lab 12/12/21 2243  INR 1.2    Cardiac Enzymes: No results for input(s): "CKTOTAL", "CKMB", "CKMBINDEX", "TROPONINI" in the last 168 hours. BNP (last 3 results) No results for input(s): "PROBNP" in the last 8760 hours. HbA1C: No results for input(s): "HGBA1C" in the last 72 hours. CBG: No results for input(s): "GLUCAP" in the last 168 hours. Lipid Profile: No results for input(s): "CHOL", "HDL", "LDLCALC", "TRIG", "CHOLHDL", "LDLDIRECT" in the last 72 hours. Thyroid Function Tests: No results for input(s): "TSH", "T4TOTAL", "FREET4", "T3FREE", "THYROIDAB" in the last 72 hours. Anemia Panel: No results for input(s): "VITAMINB12", "FOLATE", "FERRITIN", "TIBC", "IRON", "RETICCTPCT" in the last 72 hours. Sepsis Labs: No results for input(s): "PROCALCITON", "LATICACIDVEN" in the last 168 hours.  No results found for this or any previous visit (from the past 240 hour(s)).       Radiology Studies: No results found.      Scheduled Meds:  atenolol  25 mg Oral Daily   cholecalciferol  1,000 Units Oral Daily   divalproex  125 mg Oral Q8H   donepezil  10 mg Oral Q1500   enoxaparin (LOVENOX) injection  40 mg Subcutaneous Q24H   finasteride  5 mg Oral Q1500   memantine  5 mg Oral BID   multivitamin with minerals  1 tablet Oral Q1500   pantoprazole sodium  40 mg Oral BID   Continuous Infusions:   LOS: 6 days    Time spent: 25 minutes     Barb Merino, MD [facility] Pager [phone]

## 2021-12-18 DIAGNOSIS — R0902 Hypoxemia: Secondary | ICD-10-CM | POA: Diagnosis not present

## 2021-12-18 DIAGNOSIS — B1920 Unspecified viral hepatitis C without hepatic coma: Secondary | ICD-10-CM | POA: Diagnosis not present

## 2021-12-18 DIAGNOSIS — G9341 Metabolic encephalopathy: Secondary | ICD-10-CM | POA: Diagnosis not present

## 2021-12-18 DIAGNOSIS — K922 Gastrointestinal hemorrhage, unspecified: Secondary | ICD-10-CM | POA: Diagnosis not present

## 2021-12-18 DIAGNOSIS — Z7401 Bed confinement status: Secondary | ICD-10-CM | POA: Diagnosis not present

## 2021-12-18 DIAGNOSIS — Z515 Encounter for palliative care: Secondary | ICD-10-CM | POA: Diagnosis not present

## 2021-12-18 DIAGNOSIS — G309 Alzheimer's disease, unspecified: Secondary | ICD-10-CM | POA: Diagnosis not present

## 2021-12-18 DIAGNOSIS — F0390 Unspecified dementia without behavioral disturbance: Secondary | ICD-10-CM | POA: Diagnosis not present

## 2021-12-18 DIAGNOSIS — M6281 Muscle weakness (generalized): Secondary | ICD-10-CM | POA: Diagnosis not present

## 2021-12-18 DIAGNOSIS — K7460 Unspecified cirrhosis of liver: Secondary | ICD-10-CM | POA: Diagnosis not present

## 2021-12-18 DIAGNOSIS — D649 Anemia, unspecified: Secondary | ICD-10-CM | POA: Diagnosis not present

## 2021-12-18 DIAGNOSIS — N3289 Other specified disorders of bladder: Secondary | ICD-10-CM | POA: Diagnosis not present

## 2021-12-18 DIAGNOSIS — N400 Enlarged prostate without lower urinary tract symptoms: Secondary | ICD-10-CM | POA: Diagnosis not present

## 2021-12-18 DIAGNOSIS — I10 Essential (primary) hypertension: Secondary | ICD-10-CM | POA: Diagnosis not present

## 2021-12-18 DIAGNOSIS — I864 Gastric varices: Secondary | ICD-10-CM | POA: Diagnosis not present

## 2021-12-18 DIAGNOSIS — R1310 Dysphagia, unspecified: Secondary | ICD-10-CM | POA: Diagnosis not present

## 2021-12-18 DIAGNOSIS — D509 Iron deficiency anemia, unspecified: Secondary | ICD-10-CM | POA: Diagnosis not present

## 2021-12-18 DIAGNOSIS — N200 Calculus of kidney: Secondary | ICD-10-CM | POA: Diagnosis not present

## 2021-12-18 DIAGNOSIS — C61 Malignant neoplasm of prostate: Secondary | ICD-10-CM | POA: Diagnosis not present

## 2021-12-18 DIAGNOSIS — F0280 Dementia in other diseases classified elsewhere without behavioral disturbance: Secondary | ICD-10-CM | POA: Diagnosis not present

## 2021-12-18 DIAGNOSIS — G9340 Encephalopathy, unspecified: Secondary | ICD-10-CM | POA: Diagnosis not present

## 2021-12-18 NOTE — Care Management Important Message (Signed)
Important Message  Patient Details  Name: Daniel Knapp MRN: [id number] Date of Birth: 1951/02/07   Medicare Important Message Given:  Yes     [name] A Farzana Koci 12/18/2021, 10:29 AM

## 2021-12-18 NOTE — Discharge Summary (Signed)
Physician Discharge Summary  Daniel Knapp HFW:[id number] DOB: 04-Feb-1951 DOA: 12/11/2021  PCP: Derinda Late, MD  Admit date: 12/11/2021 Discharge date: 12/18/2021  Admitted From: Skilled nursing facility Disposition: Skilled nursing facility  Recommendations for Outpatient Follow-up:  Consult palliative care team at the skilled nursing facility.  Consult hospice if any worsening clinical status.  Home Health: N/A Equipment/Devices: N/A  Discharge Condition: Fair CODE STATUS: DNR/DNI, comfort care Diet recommendation: Regular diet, low salt.  Assisted feeding.  Discharge summary: 71 year old gentleman with history of dementia, untreated hepatitis C and cirrhosis, hypertension who was recently hospitalized at [facility] 6/15-6/19 with acute blood loss anemia secondary to bleeding gastric varices requiring 4 units of PRBC transfusions for hemoglobin 3.7, unsuccessful hepatic parenchymal tract coil embolization who was discharged to [facility] for short-term skilled rehab brought back to the emergency room with altered mental status.  Patient was found more sleepy and lethargic, agitated at times.  In the emergency room slightly elevated creatinine from baseline otherwise hemodynamically stable. CT head was nonrevealing.  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  Chest x-ray was clear.   He remained in the hospital pending clinical improvement and placement.  Also seen and followed by palliative care.     Assessment & plan of care:   Acute metabolic encephalopathy in a patient with advanced dementia, suspect secondary to pain and discomfort from ureteric stone. Acute kidney injury with unknown baseline. Hydronephrosis with urinary obstruction due to right renal calculi, bladder mass noted on CT scan with history of prostate cancer:   Mental status has more or less  stabilized.  He has advanced dementia.  Patient has good control of behavior. Seen by urology, not recommending any intervention or biopsy.  This is likely local recurrence of prostate cancer. See goal of care discussion below.  Continue supportive care. Renal functions has improved.   Essential hypertension: Blood pressure was treated with amlodipine and atenolol.  Blood pressures are usually low.  We will discontinue amlodipine.  Continue atenolol.    Dementia: On Aricept Namenda and Depakote.  Behavior is very well controlled.   Recent bleeding gastric varices: Hemoglobin is stable.   Goal of care: Advanced dementia and failure to thrive. Working with PT OT, plan to transfer to a SNF. Continue palliative care follow-up for goal of care, progression of disease and hopefully hospice in the future. Patient currently eating normally with assist. DNR/DNI documented and in the chart. MOST form is available in the chart and patient is comfort care if any deterioration or worsening clinical status.   Discharge Diagnoses:  Principal Problem:   Acute metabolic encephalopathy Active Problems:   Hydronephrosis with urinary obstruction due to right ureteral calculus   AKI (acute kidney injury) ([place])   Bladder mass   HTN (hypertension)   Dementia (HCC)   Prostate cancer ([place])   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/20/21   Cirrhosis [facility])    Discharge Instructions  Discharge Instructions     Diet general   Complete by: As directed    Assisted feeding.  All-time aspiration precautions.   Discharge instructions   Complete by: As directed    Consult palliative care when patient is admitted to a skilled nursing facility.   Increase activity slowly   Complete by: As directed       Allergies as of 12/18/2021   No Known Allergies  Medication List     STOP taking these medications    amLODipine 10 MG tablet Commonly known as: NORVASC       TAKE these medications    atenolol  25 MG tablet Commonly known as: TENORMIN Take 25 mg by mouth daily.   cholecalciferol 25 MCG (1000 UNIT) tablet Commonly known as: VITAMIN D Take 1,000 Units by mouth daily.   divalproex 250 MG 24 hr tablet Commonly known as: DEPAKOTE ER Take 250 mg by mouth daily in the afternoon.   donepezil 10 MG tablet Commonly known as: ARICEPT Take 10 mg by mouth daily in the afternoon.   finasteride 5 MG tablet Commonly known as: PROSCAR Take 5 mg by mouth daily in the afternoon. Reported on 08/17/2015   memantine 5 MG tablet Commonly known as: NAMENDA Take 5 mg by mouth 2 (two) times daily.   multivitamin with minerals Tabs tablet Take 1 tablet by mouth daily in the afternoon.   pantoprazole 40 MG tablet Commonly known as: Protonix Take 1 tablet (40 mg total) by mouth 2 (two) times daily before a meal.   tamsulosin 0.4 MG Caps capsule Commonly known as: FLOMAX Take 0.4 mg by mouth daily in the afternoon. Reported on 09/01/2015   vitamin B-12 1000 MCG tablet Commonly known as: CYANOCOBALAMIN Take 1,000 mcg by mouth daily in the afternoon.        Contact information for after-discharge care     Destination     [facility] .   Service: Skilled Nursing Contact information: [address] [place] [phone]                    No Known Allergies  Consultations: Urology Palliative care   Procedures/Studies: CT Head Wo Contrast  Result Date: 12/11/2021 CLINICAL DATA:  Altered mental status with history of GI bleed and anemia. EXAM: CT HEAD WITHOUT CONTRAST TECHNIQUE: Contiguous axial images were obtained from the base of the skull through the vertex without intravenous contrast. RADIATION DOSE REDUCTION: This exam was performed according to the departmental dose-optimization program which includes automated exposure control, adjustment of the mA and/or kV according to patient size and/or use of iterative  reconstruction technique. COMPARISON:  December 20, 2021 FINDINGS: Brain: There is moderate severity cerebral atrophy with widening of the extra-axial spaces and ventricular dilatation. There are areas of decreased attenuation within the white matter tracts of the supratentorial brain, consistent with microvascular disease changes. Vascular: No hyperdense vessel or unexpected calcification. Skull: Normal. Negative for fracture or focal lesion. Sinuses/Orbits: No acute finding. 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 IMPRESSION: 1. No acute intracranial abnormality. 2. Generalized cerebral atrophy and microvascular disease changes of the supratentorial brain. Electronically Signed   By: Virgina Norfolk M.D.   On: 12/11/2021 21:34   CT ABDOMEN PELVIS W CONTRAST  Result Date: 12/11/2021 CLINICAL DATA:  History of GI bleed with low oxygen readings at facility. EXAM: CT ABDOMEN AND PELVIS WITH CONTRAST TECHNIQUE: Multidetector CT imaging of the abdomen and pelvis was performed using the standard protocol following bolus administration of intravenous contrast. RADIATION DOSE REDUCTION: This exam was performed according to the departmental dose-optimization program which includes automated exposure control, adjustment of the mA and/or kV according to patient size and/or use of iterative reconstruction technique. CONTRAST:  152m OMNIPAQUE IOHEXOL 300 MG/ML  SOLN COMPARISON:  November 21, 2021 FINDINGS: Lower chest: Mild atelectasis is seen within the posterior aspects of  the bilateral lung bases. Hepatobiliary: Metallic density foci are seen within the posterolateral aspect of the right lobe of the liver. Associated streak artifact is seen. This represents a new finding when compared to the prior study. The gallbladder is normal in appearance, without evidence of gallstones, gallbladder wall thickening or pericholecystic inflammation. No biliary dilatation is seen Pancreas: Unremarkable. No pancreatic  ductal dilatation or surrounding inflammatory changes. Spleen: A predominant stable, 4.0 cm x 3.7 cm area of parenchymal low attenuation is seen within the anterior aspect of the spleen. Adrenals/Urinary Tract: Adrenal glands are unremarkable. Kidneys are normal in size. A stable subcentimeter simple cyst is seen within the posterior aspect of the upper pole of the left kidney. A 4 mm obstructing renal calculus is seen within the proximal right ureter. Mild right-sided hydronephrosis and hydroureter are seen. A 4.1 cm x 3.0 cm x 2.7 cm area of heterogeneous soft tissue attenuation is seen within the anterolateral aspect of the base of the urinary bladder, to the right of midline. Stomach/Bowel: Stomach is within normal limits. Appendix appears normal. No evidence of bowel wall thickening, distention, or inflammatory changes. Noninflamed diverticula are seen throughout the sigmoid colon. Vascular/Lymphatic: Aortic atherosclerosis. There is mild, stable aortocaval lymphadenopathy. Reproductive: The prostate gland is surgically absent. Other: No abdominal wall hernia or abnormality. No abdominopelvic ascites. Musculoskeletal: Multilevel degenerative changes seen throughout the lumbar spine. IMPRESSION: 1. 4 mm obstructing right ureteral calculus. 2. 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 Correlation with cystoscopy is recommended. 3. Sigmoid diverticulosis. 4. Postprocedural changes suspected within the right lobe of the liver. Aortic Atherosclerosis (ICD10-I70.0). Electronically Signed   By: Virgina Norfolk M.D.   On: 12/11/2021 21:32   DG Chest Portable 1 View  Result Date: 12/11/2021 CLINICAL DATA:  Weakness and altered mental status. EXAM: PORTABLE CHEST 1 VIEW COMPARISON:  09/16/2014 FINDINGS: Low lung volumes. Mild bibasilar atelectasis. No confluent consolidation. No pulmonary edema, pleural effusion, or pneumothorax. No acute  osseous abnormalities are seen. IMPRESSION: Low lung volumes with mild bibasilar atelectasis. Electronically Signed   By: Keith Rake M.D.   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 Plan for percutaneous, transhepatic portal venogram and possible variceal obliteration. EXAM: 1. Ultrasound-guided transhepatic portal vein access 2. Portal venogram 3. Selective catheterization and venography of posterior gastric vein 4. Coil embolization of hepatic parenchymal catheter track MEDICATIONS: None. ANESTHESIA/SEDATION: Moderate (conscious) sedation was employed during this procedure. A total of Versed 3 mg and Fentanyl 100 mcg was administered intravenously. Moderate Sedation Time: 103 minutes. The patient's level of consciousness and vital signs were monitored continuously by radiology nursing throughout the procedure under my direct supervision. CONTRAST:  75 mL Omnipaque 300, intravenous FLUOROSCOPY: Radiation Exposure Index (as provided by the fluoroscopic device): 427 mGy Kerma COMPLICATIONS: None immediate. PROCEDURE: Informed written consent was obtained from the patient's sister and power of attorney, [name], after a thorough discussion of the procedural risks, benefits and alternatives. All questions were addressed. Maximal Sterile Barrier Technique was utilized including caps, mask, sterile gowns, sterile gloves, sterile drape, hand hygiene and skin antiseptic. A timeout was performed prior to the initiation of the procedure. The right mid axillary upper abdomen was prepped and  draped in standard fashion. Ultrasound evaluation demonstrated patent portal vein in the peripheral aspect of the right lobe of the liver. Subdermal Local anesthesia was provided  with 1% lidocaine. Small skin nick was made. Under direct ultrasound visualization, a radical branch of the right portal vein was accessed with a 21 gauge Chiba needle. A wire was advanced to the central, main portal vein. A 6 French Accustick set was then advanced to the right portal vein and the wire was removed. Hand injection of contrast demonstrated appropriate intraluminal position. A Wholey wire was then inserted over which the 6 [place] sheath was exchanged for a 7 [place], braided vascular sheath. An angled tip, 5 French catheter was then directed to the splenic vein. Catheter was exchanged for a Omni Flush catheter and splenic venogram was performed. The splenic vein is widely patent with brisk antegrade flow. There is no significant reflux into gastric varices. The catheter was exchanged for a C2 catheter which was used to identify an cannulate the posterior gastric vein. The catheter was exchanged for a 7 mm x 2 cm mustang balloon. The balloon was insufflated in the central aspect of the posterior gastric vein for occlusive purposes. Venogram was then performed in multiple obliquities which demonstrated mildly prominent gastric varices about the fundus with multiple draining veins, no evidence of active hemorrhage. There was neck attempt at exchanging the indwelling sheath and balloon occlusion catheter, however tortuosity of the portal vein thwarted these attempts. Therefore, given the overall patient condition, mildly dilated varices, and technical difficulties due to tortuosity, attempts at variceal bleed aeration were aborted. The indwelling catheter was removed. The sheath was retracted to the peripheral aspect the right lobe of the liver. Under direct fluoroscopic visualization, a single 6 mm Nester coil was deployed in the peripheral sheath track and hemostasis was achieved with brief manual compression. Patient tolerated the procedure well was transferred back to the floor in stable condition.  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 The posterior gastric vein is widely patent with multiple draining outflow veins, and there is no evidence of splenic vein thrombosis as initially queried. Given these findings in addition to the patient's overall condition, attempts at variceal obliteration were aborted. Ruthann Cancer, MD [facility] Radiology Electronically Signed   By: Ruthann Cancer M.D.   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 Plan for percutaneous, transhepatic portal venogram and possible variceal obliteration. EXAM: 1. Ultrasound-guided transhepatic portal vein access 2. Portal venogram 3. Selective catheterization and venography of posterior gastric vein 4. Coil embolization of hepatic parenchymal catheter track MEDICATIONS: None. ANESTHESIA/SEDATION: Moderate (conscious) sedation was employed during this procedure. A total of Versed 3 mg and Fentanyl 100 mcg was administered intravenously. Moderate Sedation Time: 103 minutes. The patient's level of consciousness and vital signs were monitored continuously by radiology nursing throughout the procedure under my direct supervision. CONTRAST:  75 mL Omnipaque 300, intravenous FLUOROSCOPY: Radiation Exposure Index (as provided by the fluoroscopic device): 093 mGy Kerma COMPLICATIONS: None immediate. PROCEDURE: Informed written consent was obtained from the patient's sister and power of attorney, [name], after a thorough discussion of the procedural risks, benefits and alternatives. All questions were addressed. Maximal Sterile Barrier Technique was utilized including caps, mask, sterile gowns, sterile  gloves, sterile drape, hand  hygiene and skin antiseptic. A timeout was performed prior to the initiation of the procedure. The right mid axillary upper abdomen was prepped and draped in standard fashion. Ultrasound evaluation demonstrated patent portal vein in the peripheral aspect of the right lobe of the liver. Subdermal Local anesthesia was provided with 1% lidocaine. Small skin nick was made. Under direct ultrasound visualization, a radical branch of the right portal vein was accessed with a 21 gauge Chiba needle. A wire was advanced to the central, main portal vein. A 6 French Accustick set was then advanced to the right portal vein and the wire was removed. Hand injection of contrast demonstrated appropriate intraluminal position. A Wholey wire was then inserted over which the 6 [place] sheath was exchanged for a 7 [place], braided vascular sheath. An angled tip, 5 French catheter was then directed to the splenic vein. Catheter was exchanged for a Omni Flush catheter and splenic venogram was performed. The splenic vein is widely patent with brisk antegrade flow. There is no significant reflux into gastric varices. The catheter was exchanged for a C2 catheter which was used to identify an cannulate the posterior gastric vein. The catheter was exchanged for a 7 mm x 2 cm mustang balloon. The balloon was insufflated in the central aspect of the posterior gastric vein for occlusive purposes. Venogram was then performed in multiple obliquities which demonstrated mildly prominent gastric varices about the fundus with multiple draining veins, no evidence of active hemorrhage. There was neck attempt at exchanging the indwelling sheath and balloon occlusion catheter, however tortuosity of the portal vein thwarted these attempts. Therefore, given the overall patient condition, mildly dilated varices, and technical difficulties due to tortuosity, attempts at variceal bleed aeration were aborted. The indwelling catheter was removed. The sheath was  retracted to the peripheral aspect the right lobe of the liver. Under direct fluoroscopic visualization, a single 6 mm Nester coil was deployed in the peripheral sheath track and hemostasis was achieved with brief manual compression. Patient tolerated the procedure well was transferred back to the floor in stable condition. 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 The posterior gastric vein is widely patent with multiple draining outflow veins, and there is no evidence of splenic vein thrombosis as initially queried. Given these findings in addition to the patient's overall condition, attempts at variceal obliteration were aborted. Ruthann Cancer, MD [facility] Radiology Electronically Signed   By: Ruthann Cancer M.D.   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 Plan for percutaneous, transhepatic portal venogram and possible variceal obliteration. EXAM: 1. Ultrasound-guided transhepatic portal vein access 2. Portal venogram 3. Selective catheterization and venography of posterior gastric vein 4. Coil embolization of hepatic parenchymal catheter track MEDICATIONS: None. ANESTHESIA/SEDATION: Moderate (conscious) sedation was employed during this procedure. A total of Versed 3 mg and Fentanyl 100 mcg was administered intravenously. Moderate Sedation Time: 103 minutes. The patient's level of consciousness and vital signs were monitored continuously by radiology nursing throughout the procedure under my direct supervision. CONTRAST:  75 mL Omnipaque 300, intravenous FLUOROSCOPY: Radiation Exposure Index (as provided by the fluoroscopic device): 409 mGy Kerma COMPLICATIONS: None immediate. PROCEDURE:  Informed written consent was obtained from the patient's sister and power of attorney, [name]  Totten, after a thorough discussion of the procedural risks, benefits and alternatives. All questions were addressed. Maximal Sterile Barrier Technique was utilized including caps, mask, sterile gowns, sterile gloves, sterile drape, hand hygiene and skin antiseptic. A timeout was performed prior to the initiation of the procedure. The right mid axillary upper abdomen was prepped and draped in standard fashion. Ultrasound evaluation demonstrated patent portal vein in the peripheral aspect of the right lobe of the liver. Subdermal Local anesthesia was provided with 1% lidocaine. Small skin nick was made. Under direct ultrasound visualization, a radical branch of the right portal vein was accessed with a 21 gauge Chiba needle. A wire was advanced to the central, main portal vein. A 6 French Accustick set was then advanced to the right portal vein and the wire was removed. Hand injection of contrast demonstrated appropriate intraluminal position. A Wholey wire was then inserted over which the 6 [place] sheath was exchanged for a 7 [place], braided vascular sheath. An angled tip, 5 French catheter was then directed to the splenic vein. Catheter was exchanged for a Omni Flush catheter and splenic venogram was performed. The splenic vein is widely patent with brisk antegrade flow. There is no significant reflux into gastric varices. The catheter was exchanged for a C2 catheter which was used to identify an cannulate the posterior gastric vein. The catheter was exchanged for a 7 mm x 2 cm mustang balloon. The balloon was insufflated in the central aspect of the posterior gastric vein for occlusive purposes. Venogram was then performed in multiple obliquities which demonstrated mildly prominent gastric varices about the fundus with multiple draining veins, no evidence of active hemorrhage. There was neck attempt at exchanging the  indwelling sheath and balloon occlusion catheter, however tortuosity of the portal vein thwarted these attempts. Therefore, given the overall patient condition, mildly dilated varices, and technical difficulties due to tortuosity, attempts at variceal bleed aeration were aborted. The indwelling catheter was removed. The sheath was retracted to the peripheral aspect the right lobe of the liver. Under direct fluoroscopic visualization, a single 6 mm Nester coil was deployed in the peripheral sheath track and hemostasis was achieved with brief manual compression. Patient tolerated the procedure well was transferred back to the floor in stable condition. 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 The posterior gastric vein is widely patent with multiple draining outflow veins, and there is no evidence of splenic vein thrombosis as initially queried. Given these findings in addition to the patient's overall condition, attempts at variceal obliteration were aborted. Ruthann Cancer, MD [facility] Radiology Electronically Signed   By: Ruthann Cancer M.D.   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 Plan for percutaneous, transhepatic portal venogram and possible variceal obliteration. EXAM: 1. Ultrasound-guided transhepatic portal vein access 2. Portal venogram 3. Selective catheterization and venography of posterior gastric vein 4. Coil embolization of hepatic parenchymal catheter track MEDICATIONS: None. ANESTHESIA/SEDATION: Moderate (conscious) sedation was employed during this procedure. A total of Versed 3 mg and Fentanyl 100 mcg was administered intravenously. Moderate Sedation Time: 103  minutes. The patient's level of consciousness and vital signs were monitored continuously by radiology nursing throughout the procedure under my direct supervision. CONTRAST:  75 mL Omnipaque 300, intravenous FLUOROSCOPY: Radiation Exposure  Index (as provided by the fluoroscopic device): 353 mGy Kerma COMPLICATIONS: None immediate. PROCEDURE: Informed written consent was obtained from the patient's sister and power of attorney, [name], after a thorough discussion of the procedural risks, benefits and alternatives. All questions were addressed. Maximal Sterile Barrier Technique was utilized including caps, mask, sterile gowns, sterile gloves, sterile drape, hand hygiene and skin antiseptic. A timeout was performed prior to the initiation of the procedure. The right mid axillary upper abdomen was prepped and draped in standard fashion. Ultrasound evaluation demonstrated patent portal vein in the peripheral aspect of the right lobe of the liver. Subdermal Local anesthesia was provided with 1% lidocaine. Small skin nick was made. Under direct ultrasound visualization, a radical branch of the right portal vein was accessed with a 21 gauge Chiba needle. A wire was advanced to the central, main portal vein. A 6 French Accustick set was then advanced to the right portal vein and the wire was removed. Hand injection of contrast demonstrated appropriate intraluminal position. A Wholey wire was then inserted over which the 6 [place] sheath was exchanged for a 7 [place], braided vascular sheath. An angled tip, 5 French catheter was then directed to the splenic vein. Catheter was exchanged for a Omni Flush catheter and splenic venogram was performed. The splenic vein is widely patent with brisk antegrade flow. There is no significant reflux into gastric varices. The catheter was exchanged for a C2 catheter which was used to identify an cannulate the posterior gastric vein. The catheter was exchanged for a 7 mm x 2 cm mustang  balloon. The balloon was insufflated in the central aspect of the posterior gastric vein for occlusive purposes. Venogram was then performed in multiple obliquities which demonstrated mildly prominent gastric varices about the fundus with multiple draining veins, no evidence of active hemorrhage. There was neck attempt at exchanging the indwelling sheath and balloon occlusion catheter, however tortuosity of the portal vein thwarted these attempts. Therefore, given the overall patient condition, mildly dilated varices, and technical difficulties due to tortuosity, attempts at variceal bleed aeration were aborted. The indwelling catheter was removed. The sheath was retracted to the peripheral aspect the right lobe of the liver. Under direct fluoroscopic visualization, a single 6 mm Nester coil was deployed in the peripheral sheath track and hemostasis was achieved with brief manual compression. Patient tolerated the procedure well was transferred back to the floor in stable condition. 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 The posterior gastric vein is widely patent with multiple draining outflow veins, and there is no evidence of splenic vein thrombosis as initially queried. Given these findings in addition to the patient's overall condition, attempts at variceal obliteration were aborted. Ruthann Cancer, MD [facility] [place] [facility] Electronically Signed   By: Ruthann Cancer M.D.   On: 11/25/2021 16:38   CT Angio Abd/Pel w/ and/or w/o  Result Date: 11/21/2021 CLINICAL DATA:  Severe symptomatic anemia, compensated cirrhosis EXAM: CTA ABDOMEN AND PELVIS WITHOUT AND WITH CONTRAST TECHNIQUE: Multidetector CT imaging of the abdomen and pelvis was performed using the standard protocol during bolus administration of intravenous contrast. Multiplanar reconstructed images and MIPs were obtained and reviewed to evaluate the  vascular anatomy. RADIATION DOSE REDUCTION: This exam was performed according to the departmental dose-optimization program which includes automated exposure control, adjustment of the mA and/or kV according to patient size and/or use of iterative reconstruction technique. CONTRAST:  14m OMNIPAQUE IOHEXOL 350 MG/ML SOLN COMPARISON:  Prior CT abdomen/pelvis 02/21/2016 FINDINGS: VASCULAR Aorta: Normal caliber aorta without aneurysm, dissection, vasculitis or significant stenosis. Celiac: Patent without evidence of aneurysm, dissection, vasculitis or significant stenosis. Lateral segmental branch of the left hepatic artery is replaced to the left gastric artery. SMA: Patent without evidence of aneurysm, dissection, vasculitis or significant stenosis. Renals: Both main renal arteries are patent without evidence of aneurysm, dissection, vasculitis, fibromuscular dysplasia or significant stenosis. Accessory left lower pole renal artery. IMA: Patent without evidence of aneurysm, dissection, vasculitis or significant stenosis. Inflow: Patent without evidence of aneurysm, dissection, vasculitis or significant stenosis. Proximal Outflow: Bilateral common femoral and visualized portions of the superficial and profunda femoral arteries are patent without evidence of aneurysm, dissection, vasculitis or significant stenosis. Veins: No focal venous abnormality. Review of the MIP images confirms the above findings. NON-VASCULAR Lower chest: Trace atherosclerotic calcifications along the coronary arteries. Dependent atelectasis in both lower lobes. Additionally, there are is mild focal ground-glass attenuation airspace opacity in the dependent aspect of the left lower lobe. No pneumothorax. No suspicious nodule. Hepatobiliary: No focal liver abnormality is seen. No gallstones, gallbladder wall thickening, or biliary dilatation. Pancreas: Abnormal appearance of the pancreatic tail which demonstrates inflammatory stranding which is  now contiguous with the splenic parenchyma. Additionally, there is focal wedge-shaped hypoattenuation within the spleen extending from the pancreatic tail. Trace fluid identified in the peripancreatic soft tissues and extending into the left pericolic gutter. Spleen: Wedge-shaped hypoattenuation extending from the splenic hilum where it abuts the pancreatic tail to the spleen surface consistent with a small region of infarct. Adrenals/Urinary Tract: Normal adrenal glands. Punctate nonobstructing stone in the interpolar right kidney measures up to 4 mm. Punctate nonobstructing stone in the lower pole of the left kidney measures 3 mm. Ureters are unremarkable. Similar appearance of prostatomegaly with nodular extension into the bladder trigone. Stomach/Bowel: Colonic diverticular disease without CT evidence of active inflammation. No evidence of obstruction or focal bowel wall thickening. Normal appendix in the right lower quadrant. The terminal ileum is unremarkable. Lymphatic: Right para-aortic lymphadenopathy. A cluster of nodes measuring 1.3, 1.71.5 cm in short axis are visible in the right para aortic space (image 120 series 5). Left internal iliac station lymph node is enlarged at approximately 1.4 cm in short axis (image 176 series 5). Reproductive: Similar appearance of prostatomegaly with nodular extension into the right bladder trigone. Other: Repaired fat containing umbilical hernia. Trace ascites in the region of the pancreatic tail extending into the left pericolic gutter. Trace free fluid visible in the pelvis is well. Musculoskeletal: No acute or significant osseous findings. Advanced lumbar degenerative disc disease and facet arthropathy. IMPRESSION: VASCULAR 1. No evidence of active bleeding. 2. No evidence of aneurysm, dissection or other acute vascular abnormality. NON-VASCULAR 1. Abnormal appearance of the pancreatic tail which is now inseparable from the adjacent and abutting splenic parenchyma.  There is inflammatory stranding in the region as well as slight interval enlargement of a dystrophic calcification compared to prior imaging from 2017. Further, there is new wedge-shaped infarct in the same region. Differential considerations include focal pancreatitis of the pancreatic tail with involvement of the spleen including a small splenic infarct versus pancreatic tail neoplasm with direct splenic invasion. Small volume reactive fluid in the peripancreatic soft tissues extending along the left pericolic gutter and low within the anatomic pelvis. 2. Abnormal retroperitoneal lymphadenopathy predominantly in the right para aortic space. Differential considerations include lymphoma versus metastatic prostate or bladder cancer. 3. Similar appearance of nodular extension of the prostate  gland into the right bladder trigone compared to prior imaging from 02/21/2016. If anything, findings are slightly less conspicuous than previously seen. 4. Colonic diverticular disease without CT evidence of active inflammation. 5. Bilateral nonobstructing nephrolithiasis. 6. Additional ancillary findings as above. Electronically Signed   By: Jacqulynn Cadet M.D.   On: 11/21/2021 16:52   US Abdomen Limited  Result Date: 11/20/2021 CLINICAL DATA:  Ascites, cirrhosis. EXAM: LIMITED ABDOMEN ULTRASOUND FOR ASCITES TECHNIQUE: Limited ultrasound survey for ascites was performed in all four abdominal quadrants. COMPARISON:  None Available. FINDINGS: Four-quadrant ultrasound demonstrates no significant abdominal ascites. IMPRESSION: No significant abdominal ascites. Electronically Signed   By: San Morelle M.D.   On: 11/20/2021 14:34   CT Cervical Spine Wo Contrast  Result Date: 11/20/2021 CLINICAL DATA:  Trauma EXAM: CT CERVICAL SPINE WITHOUT CONTRAST TECHNIQUE: Multidetector CT imaging of the cervical spine was performed without intravenous contrast. Multiplanar CT image reconstructions were also generated. RADIATION  DOSE REDUCTION: This exam was performed according to the departmental dose-optimization program which includes automated exposure control, adjustment of the mA and/or kV according to patient size and/or use of iterative reconstruction technique. COMPARISON:  None Available. FINDINGS: Alignment: Degenerative reversal of the normal cervical lordosis. Skull base and vertebrae: No acute fracture. No primary bone lesion or focal pathologic process. Soft tissues and spinal canal: No prevertebral fluid or swelling. No visible canal hematoma. Disc levels: Moderate to severe disc space height loss and anterior osteophytosis of C5 through T1, with otherwise mild disc space height loss and osteophytosis of the upper cervical levels. Upper chest: Negative. Other: None. IMPRESSION: 1. No acute fracture or static subluxation of the cervical spine. 2. Moderate to severe disc space height loss and anterior osteophytosis of C5 through T1, with associated degenerative reversal of the normal cervical lordosis. Electronically Signed   By: Delanna Ahmadi M.D.   On: 11/20/2021 10:35   CT HEAD WO CONTRAST (5MM)  Result Date: 11/20/2021 CLINICAL DATA:  Altered mental status, nontraumatic (Ped 0-17y) EXAM: CT HEAD WITHOUT CONTRAST TECHNIQUE: Contiguous axial images were obtained from the base of the skull through the vertex without intravenous contrast. RADIATION DOSE REDUCTION: This exam was performed according to the departmental dose-optimization program which includes automated exposure control, adjustment of the mA and/or kV according to patient size and/or use of iterative reconstruction technique. COMPARISON:  MRI head April 16, 2018. FINDINGS: Brain: No evidence of acute infarction, hemorrhage, hydrocephalus, extra-axial collection or mass lesion/mass effect. Cerebral atrophy. Vascular: No hyperdense vessel identified. Calcific intracranial atherosclerosis. Skull: No acute fracture. Sinuses/Orbits: Largely clear sinuses.  No  acute orbital findings. Other: No mastoid effusions. IMPRESSION: 1. No evidence of acute intracranial abnormality. 2.  Cerebral atrophy (ICD10-G31.9). Electronically Signed   By: Margaretha Sheffield M.D.   On: 11/20/2021 10:31   (Echo, Carotid, EGD, Colonoscopy, ERCP)    Subjective: Patient seen and examined.  No overnight events.  Remains pleasant.  Remains in the bed.  Being fed by nursing staff.   Discharge Exam: Vitals:   12/18/21 0639 12/18/21 0800  BP: 128/72 (!) 145/76  Pulse: 62 60  Resp: 18 16  Temp:  98.6 F (37 C)  SpO2: 100%    Vitals:   12/17/21 1600 12/17/21 2122 12/18/21 0639 12/18/21 0800  BP: 125/77 (!) 147/106 128/72 (!) 145/76  Pulse: 60 68 62 60  Resp: '16 18 18 16  '$ Temp: 97.8 F (36.6 C) 97.8 F (36.6 C)  98.6 F (37 C)  TempSrc: Axillary  SpO2: 100% 100% 100%   Weight:      Height:        General: Pt is alert, awake, not in acute distress Frail and debilitated. Pleasant and confused.  Quiet and composed. Cardiovascular: RRR, S1/S2 +, no rubs, no gallops Respiratory: CTA bilaterally, no wheezing, no rhonchi Abdominal: Soft, NT, ND, bowel sounds + Extremities: no edema, no cyanosis    The results of significant diagnostics from this hospitalization (including imaging, microbiology, ancillary and laboratory) are listed below for reference.     Microbiology: No results found for this or any previous visit (from the past 240 hour(s)).   Labs: BNP (last 3 results) No results for input(s): "BNP" in the last 8760 hours. Basic Metabolic Panel: Recent Labs  Lab 12/11/21 1623 12/12/21 0525  NA 136 138  K 3.7 3.4*  CL 104 107  CO2 21* 25  GLUCOSE 96 94  BUN 11 9  CREATININE 1.43* 1.03  CALCIUM 9.3 8.9   Liver Function Tests: Recent Labs  Lab 12/11/21 1623  AST 54*  ALT 32  ALKPHOS 104  BILITOT 0.7  PROT 9.0*  ALBUMIN 3.7   No results for input(s): "LIPASE", "AMYLASE" in the last 168 hours. Recent Labs  Lab 12/11/21 2006   AMMONIA 14   CBC: Recent Labs  Lab 12/11/21 1623 12/12/21 0525 12/14/21 0557  WBC 8.0 5.2 5.4  NEUTROABS 5.9  --   --   HGB 10.1* 9.3* 8.9*  HCT 35.2* 31.2* 29.4*  MCV 87.1 82.8 81.9  PLT 251 196 201   Cardiac Enzymes: No results for input(s): "CKTOTAL", "CKMB", "CKMBINDEX", "TROPONINI" in the last 168 hours. BNP: Invalid input(s): "POCBNP" CBG: No results for input(s): "GLUCAP" in the last 168 hours. D-Dimer No results for input(s): "DDIMER" in the last 72 hours. Hgb A1c No results for input(s): "HGBA1C" in the last 72 hours. Lipid Profile No results for input(s): "CHOL", "HDL", "LDLCALC", "TRIG", "CHOLHDL", "LDLDIRECT" in the last 72 hours. Thyroid function studies No results for input(s): "TSH", "T4TOTAL", "T3FREE", "THYROIDAB" in the last 72 hours.  Invalid input(s): "FREET3" Anemia work up No results for input(s): "VITAMINB12", "FOLATE", "FERRITIN", "TIBC", "IRON", "RETICCTPCT" in the last 72 hours. Urinalysis    Component Value Date/Time   COLORURINE AMBER (A) 12/14/2021 1812   APPEARANCEUR CLOUDY (A) 12/14/2021 1812   APPEARANCEUR Cloudy (A) 09/01/2015 1015   LABSPEC 1.021 12/14/2021 1812   LABSPEC 1.014 09/17/2014 2237   PHURINE 5.0 12/14/2021 1812   GLUCOSEU NEGATIVE 12/14/2021 1812   GLUCOSEU Negative 09/17/2014 2237   HGBUR MODERATE (A) 12/14/2021 1812   BILIRUBINUR NEGATIVE 12/14/2021 1812   BILIRUBINUR Negative 09/01/2015 1015   BILIRUBINUR Negative 09/17/2014 2237   KETONESUR NEGATIVE 12/14/2021 1812   PROTEINUR 100 (A) 12/14/2021 1812   NITRITE NEGATIVE 12/14/2021 1812   LEUKOCYTESUR SMALL (A) 12/14/2021 1812   LEUKOCYTESUR Negative 09/17/2014 2237   Sepsis Labs Recent Labs  Lab 12/11/21 1623 12/12/21 0525 12/14/21 0557  WBC 8.0 5.2 5.4   Microbiology No results found for this or any previous visit (from the past 240 hour(s)).   Time coordinating discharge: 35 minutes  SIGNED:   Barb Merino, MD  [facility] 12/18/2021,  8:54 AM

## 2021-12-18 NOTE — Progress Notes (Signed)
[name] to be D/C'd Skilled nursing facility per MD order.   An After Visit Summary was printed and given to [facility] for receiving facility.   Report called to Blythe Stanford, receiving nurse at [facility]  Patient escorted via stretcher, and D/C to [facility] via non-emergency ambulance.  Manuella Ghazi 12/18/2021 10:39 AM

## 2021-12-18 NOTE — TOC Transition Note (Signed)
Transition of Care [facility]) - CM/SW Discharge Note   Patient Details  Name: Daniel Knapp MRN: [id number] Date of Birth: 07/29/1950  Transition of Care [facility]) CM/SW Contact:  Beverly Sessions, RN Phone Number: 12/18/2021, 10:06 AM   Clinical Narrative:     Patient will DC to: [facility] Anticipated DC date:12/18/21  Family notified:Sister Arboriculturist by:EMA  Per MD patient ready for DC to . RN, patient, patient's family, and facility notified of DC. Discharge Summary sent to facility. RN given number for report. DC packet on chart. Ambulance transport requested for patient, 2nd on the list for pick up  TOC signing off.  Isaias Cowman [facility] [phone]     Barriers to Discharge: Continued Medical Work up   Patient Goals and CMS Choice Patient states their goals for this hospitalization and ongoing recovery are:: long term care at [facility]      Discharge Placement                       Discharge Plan and Services                                     Social Determinants of Health ([place]) Interventions     Readmission Risk Interventions     No data to display

## 2021-12-18 NOTE — NC FL2 (Signed)
[place] LEVEL OF CARE SCREENING TOOL     IDENTIFICATION  Patient Name: Daniel Knapp Birthdate: Jul 03, 1950 Sex: male Admission Date (Current Location): 12/11/2021  [facility] and [place] Number:  Engineering geologist and Address:         Provider Number: [id number]  Attending Physician Name and Address:  Barb Merino, MD  Relative Name and Phone Number:       Current Level of Care: SNF Recommended Level of Care: [place] Prior Approval Number:    Date Approved/Denied:   PASRR Number: [id number]  Discharge Plan: SNF    Current Diagnoses: Patient Active Problem List   Diagnosis Date Noted   Acute metabolic encephalopathy 62/83/1517   Bladder mass 12/11/2021   Hydronephrosis with urinary obstruction due to right ureteral calculus 12/11/2021   AKI (acute kidney injury) ([place]) 12/11/2021   Abnormal CT scan, gastrointestinal tract    Iron deficiency anemia 11/23/2021   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/20/21 11/23/2021   Cirrhosis ([place]) 11/23/2021   Upper GI bleed 11/23/2021   Symptomatic anemia 11/20/2021   HTN (hypertension)    Dementia ([place])    Hepatitis    Hypokalemia    Fall    Prostate cancer ([place]) 01/23/2016   BPH (benign prostatic hyperplasia) 08/17/2015   Urinary retention 11/10/2014   BPH (benign prostatic hypertrophy) with urinary retention 11/10/2014    Orientation RESPIRATION BLADDER Height & Weight     Self, Place  Normal Incontinent Weight: 86.2 kg Height:  '6\' 2"'$  (188 cm)  BEHAVIORAL SYMPTOMS/MOOD NEUROLOGICAL BOWEL NUTRITION STATUS      Incontinent Diet (Heart Healthy)  AMBULATORY STATUS COMMUNICATION OF NEEDS Skin   Extensive Assist Verbally Normal                       Personal Care Assistance Level of Assistance              Functional Limitations Info             SPECIAL CARE FACTORS FREQUENCY  PT (By licensed PT), OT (By licensed OT)                    Contractures Contractures  Info: Not present    Additional Factors Info  Code Status, Allergies Code Status Info: DNR Allergies Info: NKDA           Current Medications (12/18/2021):  This is the current hospital active medication list Current Facility-Administered Medications  Medication Dose Route Frequency Provider Last Rate Last Admin   acetaminophen (TYLENOL) tablet 650 mg  650 mg Oral Q6H PRN Athena Masse, MD       Or   acetaminophen (TYLENOL) suppository 650 mg  650 mg Rectal Q6H PRN Athena Masse, MD       atenolol (TENORMIN) tablet 25 mg  25 mg Oral Daily Judd Gaudier V, MD   25 mg at 12/17/21 1012   cholecalciferol (VITAMIN D3) tablet 1,000 Units  1,000 Units Oral Daily Athena Masse, MD   1,000 Units at 12/17/21 1012   divalproex (DEPAKOTE SPRINKLE) capsule 125 mg  125 mg Oral Q8H Barb Merino, MD   125 mg at 12/18/21 0637   donepezil (ARICEPT) tablet 10 mg  10 mg Oral Q1500 Athena Masse, MD   10 mg at 12/17/21 1624   enoxaparin (LOVENOX) injection 40 mg  40 mg Subcutaneous Q24H Barb Merino, MD   40 mg at 12/17/21 2100  finasteride (PROSCAR) tablet 5 mg  5 mg Oral Q1500 Athena Masse, MD   5 mg at 12/17/21 1625   memantine (NAMENDA) tablet 5 mg  5 mg Oral BID Athena Masse, MD   5 mg at 12/17/21 2100   morphine (PF) 2 MG/ML injection 2 mg  2 mg Intravenous Q2H PRN Athena Masse, MD       multivitamin with minerals tablet 1 tablet  1 tablet Oral Q1500 Athena Masse, MD   1 tablet at 12/17/21 1624   ondansetron (ZOFRAN) tablet 4 mg  4 mg Oral Q6H PRN Athena Masse, MD       Or   ondansetron [facility]) injection 4 mg  4 mg Intravenous Q6H PRN Athena Masse, MD       pantoprazole sodium (PROTONIX) 40 mg/20 mL oral suspension 40 mg  40 mg Oral BID Wynelle Cleveland, RPH   40 mg at 12/17/21 2100     Discharge Medications: Please see discharge summary for a list of discharge medications.  Relevant Imaging Results:  Relevant Lab Results:   Additional Information SSN: [id number]  [id number]. Moderna COVID-19 Vaccine 04/20/2020 , 09/05/2019 , 08/08/2019  Beverly Sessions, RN

## 2021-12-18 NOTE — TOC Progression Note (Signed)
**Note Daniel-Identified via Obfuscation** Transition of Care [facility]) - Progression Note    Patient Details  Name: Daniel Knapp MRN: [id number] Date of Birth: 10-15-1950  Transition of Care [facility]) CM/SW Contact  Beverly Sessions, RN Phone Number: 12/18/2021, 9:25 AM  Clinical Narrative:     Message sent to Hilda Blades at [facility] to confirm if they can accept patient today  Awaiting response   Expected Discharge Plan: Skilled Nursing Facility Barriers to Discharge: Continued Medical Work up  Expected Discharge Plan and Services Expected Discharge Plan: [place]         Expected Discharge Date: 12/18/21                                     Social Determinants of Health (SDOH) Interventions    Readmission Risk Interventions     No data to display

## 2021-12-19 DIAGNOSIS — F0280 Dementia in other diseases classified elsewhere without behavioral disturbance: Secondary | ICD-10-CM | POA: Diagnosis not present

## 2021-12-19 DIAGNOSIS — I10 Essential (primary) hypertension: Secondary | ICD-10-CM | POA: Diagnosis not present

## 2021-12-19 DIAGNOSIS — G309 Alzheimer's disease, unspecified: Secondary | ICD-10-CM | POA: Diagnosis not present

## 2021-12-19 DIAGNOSIS — K7460 Unspecified cirrhosis of liver: Secondary | ICD-10-CM | POA: Diagnosis not present

## 2021-12-19 DIAGNOSIS — Z515 Encounter for palliative care: Secondary | ICD-10-CM | POA: Diagnosis not present

## 2021-12-20 DIAGNOSIS — I10 Essential (primary) hypertension: Secondary | ICD-10-CM | POA: Diagnosis not present

## 2021-12-20 DIAGNOSIS — N3289 Other specified disorders of bladder: Secondary | ICD-10-CM | POA: Diagnosis not present

## 2021-12-20 DIAGNOSIS — I864 Gastric varices: Secondary | ICD-10-CM | POA: Diagnosis not present

## 2021-12-20 DIAGNOSIS — N400 Enlarged prostate without lower urinary tract symptoms: Secondary | ICD-10-CM | POA: Diagnosis not present

## 2021-12-20 DIAGNOSIS — F0390 Unspecified dementia without behavioral disturbance: Secondary | ICD-10-CM | POA: Diagnosis not present

## 2021-12-20 DIAGNOSIS — B1920 Unspecified viral hepatitis C without hepatic coma: Secondary | ICD-10-CM | POA: Diagnosis not present

## 2021-12-20 DIAGNOSIS — N200 Calculus of kidney: Secondary | ICD-10-CM | POA: Diagnosis not present

## 2021-12-20 DIAGNOSIS — K7460 Unspecified cirrhosis of liver: Secondary | ICD-10-CM | POA: Diagnosis not present

## 2021-12-21 DIAGNOSIS — K7460 Unspecified cirrhosis of liver: Secondary | ICD-10-CM | POA: Diagnosis not present

## 2021-12-21 DIAGNOSIS — N3289 Other specified disorders of bladder: Secondary | ICD-10-CM | POA: Diagnosis not present

## 2021-12-21 DIAGNOSIS — B1920 Unspecified viral hepatitis C without hepatic coma: Secondary | ICD-10-CM | POA: Diagnosis not present

## 2021-12-21 DIAGNOSIS — D509 Iron deficiency anemia, unspecified: Secondary | ICD-10-CM | POA: Diagnosis not present

## 2021-12-21 DIAGNOSIS — F0280 Dementia in other diseases classified elsewhere without behavioral disturbance: Secondary | ICD-10-CM | POA: Diagnosis not present

## 2021-12-21 DIAGNOSIS — I10 Essential (primary) hypertension: Secondary | ICD-10-CM | POA: Diagnosis not present

## 2021-12-21 DIAGNOSIS — D649 Anemia, unspecified: Secondary | ICD-10-CM | POA: Diagnosis not present

## 2021-12-21 DIAGNOSIS — G309 Alzheimer's disease, unspecified: Secondary | ICD-10-CM | POA: Diagnosis not present

## 2022-01-01 DIAGNOSIS — R2681 Unsteadiness on feet: Secondary | ICD-10-CM | POA: Diagnosis not present

## 2022-01-01 DIAGNOSIS — M6281 Muscle weakness (generalized): Secondary | ICD-10-CM | POA: Diagnosis not present

## 2022-01-09 DIAGNOSIS — Z8546 Personal history of malignant neoplasm of prostate: Secondary | ICD-10-CM | POA: Diagnosis not present

## 2022-01-09 DIAGNOSIS — L603 Nail dystrophy: Secondary | ICD-10-CM | POA: Diagnosis not present

## 2022-01-09 DIAGNOSIS — G3189 Other specified degenerative diseases of nervous system: Secondary | ICD-10-CM | POA: Diagnosis not present

## 2022-01-09 DIAGNOSIS — C61 Malignant neoplasm of prostate: Secondary | ICD-10-CM | POA: Diagnosis not present

## 2022-01-09 DIAGNOSIS — N400 Enlarged prostate without lower urinary tract symptoms: Secondary | ICD-10-CM | POA: Diagnosis not present

## 2022-01-09 DIAGNOSIS — I739 Peripheral vascular disease, unspecified: Secondary | ICD-10-CM | POA: Diagnosis not present

## 2022-01-09 DIAGNOSIS — F0390 Unspecified dementia without behavioral disturbance: Secondary | ICD-10-CM | POA: Diagnosis not present

## 2022-01-09 DIAGNOSIS — M6281 Muscle weakness (generalized): Secondary | ICD-10-CM | POA: Diagnosis not present

## 2022-01-15 DIAGNOSIS — L603 Nail dystrophy: Secondary | ICD-10-CM | POA: Diagnosis not present

## 2022-01-15 DIAGNOSIS — I739 Peripheral vascular disease, unspecified: Secondary | ICD-10-CM | POA: Diagnosis not present

## 2022-01-16 ENCOUNTER — Encounter: Payer: Self-pay | Admitting: Emergency Medicine

## 2022-01-16 DIAGNOSIS — I10 Essential (primary) hypertension: Secondary | ICD-10-CM | POA: Diagnosis not present

## 2022-01-16 DIAGNOSIS — C61 Malignant neoplasm of prostate: Secondary | ICD-10-CM | POA: Insufficient documentation

## 2022-01-16 DIAGNOSIS — K625 Hemorrhage of anus and rectum: Secondary | ICD-10-CM | POA: Diagnosis not present

## 2022-01-16 DIAGNOSIS — K921 Melena: Principal | ICD-10-CM | POA: Insufficient documentation

## 2022-01-16 DIAGNOSIS — Z79899 Other long term (current) drug therapy: Secondary | ICD-10-CM | POA: Insufficient documentation

## 2022-01-16 DIAGNOSIS — R4182 Altered mental status, unspecified: Secondary | ICD-10-CM | POA: Diagnosis not present

## 2022-01-16 DIAGNOSIS — F0390 Unspecified dementia without behavioral disturbance: Secondary | ICD-10-CM | POA: Diagnosis not present

## 2022-01-16 DIAGNOSIS — R58 Hemorrhage, not elsewhere classified: Secondary | ICD-10-CM | POA: Diagnosis not present

## 2022-01-16 DIAGNOSIS — D509 Iron deficiency anemia, unspecified: Secondary | ICD-10-CM | POA: Insufficient documentation

## 2022-01-16 DIAGNOSIS — R4020 Unspecified coma: Secondary | ICD-10-CM | POA: Diagnosis not present

## 2022-01-16 DIAGNOSIS — R404 Transient alteration of awareness: Secondary | ICD-10-CM | POA: Diagnosis not present

## 2022-01-16 NOTE — ED Notes (Signed)
Pts POA (Sister) [name] contacted and notified of the patients arrival. [phone].

## 2022-01-16 NOTE — ED Triage Notes (Signed)
Pt presents via EMS from [facility] for rectal bleeding. Per staff, the patient had one diaper with bloody stool. Pt was just D/C for a GI bleed and prostate Cancer - Per paperwork the patient was admitted to [facility] for comfort care. Pt denies any pain or concerns at this time. Denies AP, CP, or SOB.   VS BP- 140/90 HR- 83 100% RA.

## 2022-01-17 ENCOUNTER — Other Ambulatory Visit: Payer: Self-pay

## 2022-01-17 ENCOUNTER — Observation Stay
Admission: EM | Admit: 2022-01-17 | Discharge: 2022-01-18 | Disposition: A | Discharge status: Skilled Nursing Facility | Payer: Medicare HMO | Attending: Osteopathic Medicine | Admitting: Osteopathic Medicine

## 2022-01-17 DIAGNOSIS — F0390 Unspecified dementia without behavioral disturbance: Secondary | ICD-10-CM | POA: Diagnosis present

## 2022-01-17 DIAGNOSIS — K7460 Unspecified cirrhosis of liver: Secondary | ICD-10-CM | POA: Diagnosis present

## 2022-01-17 DIAGNOSIS — K921 Melena: Secondary | ICD-10-CM | POA: Diagnosis present

## 2022-01-17 DIAGNOSIS — K625 Hemorrhage of anus and rectum: Secondary | ICD-10-CM

## 2022-01-17 DIAGNOSIS — C61 Malignant neoplasm of prostate: Secondary | ICD-10-CM | POA: Diagnosis present

## 2022-01-17 DIAGNOSIS — I10 Essential (primary) hypertension: Secondary | ICD-10-CM | POA: Diagnosis present

## 2022-01-17 DIAGNOSIS — K7469 Other cirrhosis of liver: Secondary | ICD-10-CM | POA: Diagnosis not present

## 2022-01-17 DIAGNOSIS — I864 Gastric varices: Secondary | ICD-10-CM | POA: Diagnosis present

## 2022-01-17 DIAGNOSIS — D509 Iron deficiency anemia, unspecified: Secondary | ICD-10-CM | POA: Diagnosis present

## 2022-01-17 LAB — CBC
HCT: 35.2 % — ABNORMAL LOW (ref 39.0–52.0)
Hemoglobin: 10.4 g/dL — ABNORMAL LOW (ref 13.0–17.0)
MCH: 23.4 pg — ABNORMAL LOW (ref 26.0–34.0)
MCHC: 29.5 g/dL — ABNORMAL LOW (ref 30.0–36.0)
MCV: 79.1 fL — ABNORMAL LOW (ref 80.0–100.0)
Platelets: 104 10*3/uL — ABNORMAL LOW (ref 150–400)
RBC: 4.45 MIL/uL (ref 4.22–5.81)
RDW: 18.9 % — ABNORMAL HIGH (ref 11.5–15.5)
WBC: 6.1 10*3/uL (ref 4.0–10.5)
nRBC: 0 % (ref 0.0–0.2)

## 2022-01-17 LAB — COMPREHENSIVE METABOLIC PANEL
ALT: 32 U/L (ref 0–44)
AST: 38 U/L (ref 15–41)
Albumin: 3.8 g/dL (ref 3.5–5.0)
Alkaline Phosphatase: 88 U/L (ref 38–126)
Anion gap: 7 (ref 5–15)
BUN: 11 mg/dL (ref 8–23)
CO2: 27 mmol/L (ref 22–32)
Calcium: 9.3 mg/dL (ref 8.9–10.3)
Chloride: 104 mmol/L (ref 98–111)
Creatinine, Ser: 1.04 mg/dL (ref 0.61–1.24)
GFR, Estimated: >60 mL/min (ref >60)
Glucose, Bld: 143 mg/dL — ABNORMAL HIGH (ref 70–99)
Potassium: 3.5 mmol/L (ref 3.5–5.1)
Sodium: 138 mmol/L (ref 135–145)
Total Bilirubin: 0.6 mg/dL (ref 0.3–1.2)
Total Protein: 8.6 g/dL — ABNORMAL HIGH (ref 6.5–8.1)

## 2022-01-17 LAB — TYPE AND SCREEN
ABO/RH(D): O POS
Antibody Screen: NEGATIVE

## 2022-01-17 LAB — HEMOGLOBIN AND HEMATOCRIT, BLOOD
HCT: 31.3 % — ABNORMAL LOW (ref 39.0–52.0)
HCT: 31.6 % — ABNORMAL LOW (ref 39.0–52.0)
HCT: 30.8 % — ABNORMAL LOW (ref 39.0–52.0)
Hemoglobin: 9.3 g/dL — ABNORMAL LOW (ref 13.0–17.0)
Hemoglobin: 9.6 g/dL — ABNORMAL LOW (ref 13.0–17.0)
Hemoglobin: 9.4 g/dL — ABNORMAL LOW (ref 13.0–17.0)

## 2022-01-17 LAB — PROTIME-INR
INR: 1.2 (ref 0.8–1.2)
Prothrombin Time: 15.2 seconds (ref 11.4–15.2)

## 2022-01-17 MED ORDER — POLYETHYLENE GLYCOL 3350 17 G PO PACK
17.0000 g | PACK | Freq: Every day | ORAL | Status: DC
Start: 2022-01-17 — End: 2022-01-18
  Administered 2022-01-17 – 2022-01-18 (×2): 17 g via ORAL
  Filled 2022-01-17 (×2): qty 1

## 2022-01-17 MED ORDER — HYDRALAZINE HCL 20 MG/ML IJ SOLN
5.0000 mg | INTRAMUSCULAR | Status: DC | PRN
Start: 2022-01-17 — End: 2022-01-18

## 2022-01-17 MED ORDER — ONDANSETRON HCL 4 MG/2ML IJ SOLN: 4.0000 mg | Freq: Four times a day (QID) | INTRAMUSCULAR | Status: DC | PRN

## 2022-01-17 MED ORDER — ACETAMINOPHEN 650 MG RE SUPP: 650.0000 mg | Freq: Four times a day (QID) | RECTAL | Status: DC | PRN

## 2022-01-17 MED ORDER — ONDANSETRON HCL 4 MG PO TABS: 4.0000 mg | ORAL_TABLET | Freq: Four times a day (QID) | ORAL | Status: DC | PRN

## 2022-01-17 MED ORDER — LACTATED RINGERS IV SOLN: INTRAVENOUS | Status: AC

## 2022-01-17 MED ORDER — ACETAMINOPHEN 325 MG PO TABS: 650.0000 mg | ORAL_TABLET | Freq: Four times a day (QID) | ORAL | Status: DC | PRN

## 2022-01-17 NOTE — NC FL2 (Signed)
[place] LEVEL OF CARE SCREENING TOOL     IDENTIFICATION  Patient Name: Daniel Knapp Birthdate: 1950-11-04 Sex: male Admission Date (Current Location): 01/17/2022  [facility] and [place] Number:  Engineering geologist and Address:  [facility], [address], [place], [place] [address]      Provider Number: [id number]  Attending Physician Name and Address:  Emeterio Reeve, DO  Relative Name and Phone Number:  [name] (Sister)   [phone] [facility])    Current Level of Care: Hospital Recommended Level of Care: Nursing Facility Prior Approval Number:    Date Approved/Denied:   PASRR Number: [id number]  Discharge Plan:  ([place] home)    Current Diagnoses: Patient Active Problem List   Diagnosis Date Noted   Hematochezia 81/06/7508   Acute metabolic encephalopathy 25/85/2778   Bladder mass 12/11/2021   Hydronephrosis with urinary obstruction due to right ureteral calculus 12/11/2021   AKI (acute kidney injury) ([place]) 12/11/2021   Abnormal CT scan, gastrointestinal tract    Iron deficiency anemia 11/23/2021   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/20/21 11/23/2021   Cirrhosis ([place]) 11/23/2021   Upper GI bleed 11/23/2021   Symptomatic anemia 11/20/2021   HTN (hypertension)    Dementia ([place])    Hepatitis    Hypokalemia    Fall    Prostate cancer ([place]) 01/23/2016   BPH (benign prostatic hyperplasia) 08/17/2015   Urinary retention 11/10/2014   BPH (benign prostatic hypertrophy) with urinary retention 11/10/2014    Orientation RESPIRATION BLADDER Height & Weight      (Disoriented x 4)  Normal (98% Room Air) Incontinent Weight: 82.8 kg Height:  '6\' 2"'$  (188 cm)  BEHAVIORAL SYMPTOMS/MOOD NEUROLOGICAL BOWEL NUTRITION STATUS   (Poor judgement, poor awareness, unable to follow commands)   Incontinent Diet  AMBULATORY STATUS COMMUNICATION OF NEEDS Skin   Extensive Assist Verbally Normal                        Personal Care Assistance Level of Assistance  Bathing, Feeding, Dressing Bathing Assistance: Limited assistance Feeding assistance: Limited assistance Dressing Assistance: Limited assistance     Functional Limitations Info  Sight, Hearing, Speech Sight Info: Impaired Hearing Info: Adequate Speech Info: Impaired (expressive aphasia)    SPECIAL CARE FACTORS FREQUENCY                       Contractures Contractures Info: Not present    Additional Factors Info  Code Status, Allergies Code Status Info: FULL CODE Allergies Info: No Known allergies           Current Medications (01/17/2022):  This is the current hospital active medication list Current Facility-Administered Medications  Medication Dose Route Frequency Provider Last Rate Last Admin   acetaminophen (TYLENOL) tablet 650 mg  650 mg Oral Q6H PRN Athena Masse, MD       Or   acetaminophen (TYLENOL) suppository 650 mg  650 mg Rectal Q6H PRN Athena Masse, MD       hydrALAZINE (APRESOLINE) injection 5 mg  5 mg Intravenous Q4H PRN Athena Masse, MD       lactated ringers infusion   Intravenous Continuous Athena Masse, MD   Stopped at 01/17/22 0751   ondansetron (ZOFRAN) tablet 4 mg  4 mg Oral Q6H PRN Athena Masse, MD       Or   ondansetron [facility]) injection 4 mg  4 mg Intravenous Q6H  PRN Athena Masse, MD         Discharge Medications: Please see discharge summary for a list of discharge medications.  Relevant Imaging Results:  Relevant Lab Results:   Additional Information SSN [id number]  Pete Pelt, RN

## 2022-01-17 NOTE — Consult Note (Signed)
Daniel Darby, MD [address]  [place]  [place], [place] [address]  Main: [phone]  Fax: [phone] Pager: [phone]   Consultation  Referring Provider:     No ref. provider found Primary Care Physician:  Derinda Late, MD Primary Gastroenterologist:  Dr. Virgina Jock      Reason for Consultation: Rectal bleeding  Date of Admission:  01/17/2022 Date of Consultation:  01/17/2022         HPI:   Daniel Knapp is a 71 y.o. male with medical history significant for Dementia, untreated hepatitis C and cirrhosis, hypertension, history of bleeding gastric varices in June 2023 requiring 4 units PRBCs, transfer to [facility], unsuccessful BRTO, was discharged to nursing home under comfort care measures, hospitalized in July 2023 with hydronephrosis secondary to recurrence of prostate cancer (no intervention planned by urology due to goals of care), and discharged to hospice, who presents to the ED from the nursing home after they noticed he had a bloody bowel movement in his diaper.  Patient was hemodynamically stable in the ER, labs revealed hemoglobin of 10.4 which is at his baseline compared to 2 months ago.  According to patient's nurse, he had a scant amount of burgundy colored blood on wiping when he was being cleaned.  Therefore, GI is consulted for further evaluation  When I saw the patient, he was nonverbal, alert, awake and not following any commands.   NSAIDs: None  Antiplts/Anticoagulants/Anti thrombotics: None  GI Procedures: Upper endoscopy 11/21/2021 - Normal duodenal bulb, first portion of the duodenum and second portion of the duodenum. - Type 1 isolated gastric varices (IGV1, varices located in the fundus), without bleeding. - Z-line regular. - Esophagogastric landmarks identified. - No specimens collected.  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-has seen Dr Delaine Lame   Prostate cancer [facility]) 02/2016   Prostate enlargement    Urinary catheter insertion/adjustment/removal    Urinary retention    UTI (lower urinary tract infection)    UTI symptoms    Weight loss     Past Surgical History:  Procedure Laterality Date   ESOPHAGOGASTRODUODENOSCOPY N/A 11/21/2021   Procedure: ESOPHAGOGASTRODUODENOSCOPY (EGD);  Surgeon: Annamaria Helling, DO;  Location: [facility] ENDOSCOPY;  Service: Gastroenterology;  Laterality: N/A;   IR ANGIOGRAM SELECTIVE EACH ADDITIONAL VESSEL  11/25/2021   IR EMBO ART  VEN HEMORR LYMPH EXTRAV  INC GUIDE ROADMAPPING  11/25/2021   IR TRANSHEPATIC PORTOGRAM WO HEMO  11/25/2021   IR US GUIDE VASC ACCESS RIGHT  11/25/2021   TRANSURETHRAL RESECTION OF BLADDER TUMOR N/A 08/17/2015   Procedure: TRANSURETHRAL RESECTION OF BLADDER TUMOR (TURBT);  Surgeon: [name], MD;  Location: [facility] ORS;  Service: Urology;  Laterality: N/A;   TRANSURETHRAL RESECTION OF PROSTATE N/A 08/17/2015   Procedure: TRANSURETHRAL RESECTION OF THE PROSTATE (TURP);  Surgeon: [name], MD;  Location: [facility] ORS;  Service: Urology;  Laterality: N/A;   XI ROBOTIC ASSISTED VENTRAL HERNIA N/A 09/30/2020   Procedure: XI ROBOTIC ASSISTED VENTRAL HERNIA;  Surgeon: Herbert Pun, MD;  Location: [facility] ORS;  Service: General;  Laterality: N/A;     Current Facility-Administered Medications:    acetaminophen (TYLENOL) tablet 650 mg, 650 mg, Oral, Q6H PRN **OR** acetaminophen (TYLENOL) suppository 650  mg, 650 mg, Rectal, Q6H PRN, Athena Masse, MD   hydrALAZINE (APRESOLINE) injection 5 mg, 5 mg, Intravenous, Q4H PRN, Athena Masse, MD   ondansetron [facility]) tablet 4 mg, 4 mg, Oral, Q6H PRN **OR** ondansetron (ZOFRAN) injection 4 mg, 4 mg, Intravenous, Q6H PRN, Athena Masse, MD   Family History  Problem Relation Age of Onset   Heart disease Mother    Cancer  Father    Kidney disease Neg Hx    Prostate cancer Neg Hx      Social History   Tobacco Use   Smoking status: Never   Smokeless tobacco: Never  Vaping Use   Vaping Use: Never used  Substance Use Topics   Alcohol use: Not Currently    Alcohol/week: 0.0 standard drinks of alcohol   Drug use: Not Currently    Types: Marijuana    Comment: years ago    Allergies as of 01/16/2022   (No Known Allergies)    Review of Systems:    All systems reviewed and negative except where noted in HPI.   Physical Exam:  Vital signs in last 24 hours: Temp:  [97 F (36.1 C)-97.6 F (36.4 C)] 97.6 F (36.4 C) (08/09 0752) Pulse Rate:  [79-96] 81 (08/09 0752) Resp:  [16-20] 16 (08/09 0752) BP: (118-145)/(82-97) 138/82 (08/09 0752) SpO2:  [98 %-99 %] 98 % (08/09 0752) Weight:  [82.8 kg-87 kg] 82.8 kg (08/09 0752)   General: Dementia, not in distress, non verbal, awake Head:  Normocephalic and atraumatic. Eyes:   No icterus.   Conjunctiva pink. PERRLA. Ears:  Normal auditory acuity. Neck:  Supple; no masses or thyroidomegaly Lungs: Respirations even and unlabored. Lungs clear to auscultation bilaterally.   No wheezes, crackles, or rhonchi.  Heart:  Regular rate and rhythm;  Without murmur, clicks, rubs or gallops Abdomen:  Soft, nondistended, nontender. Normal bowel sounds. No appreciable masses or hepatomegaly.  No rebound or guarding.  Rectal:  Not performed. Msk:  Symmetrical without gross deformities. Extremities:  Without edema, cyanosis or clubbing. Neurologic:  Alert and oriented x0;  Skin:  Intact without significant lesions or rashes.  LAB RESULTS:    Latest Ref Rng & Units 01/17/2022   11:46 AM 01/17/2022    5:31 AM 01/16/2022   11:36 PM  CBC  WBC 4.0 - 10.5 K/uL   6.1   Hemoglobin 13.0 - 17.0 g/dL 9.6  9.3  10.4   Hematocrit 39.0 - 52.0 % 31.6  31.3  35.2   Platelets 150 - 400 K/uL   104     BMET    Latest Ref Rng & Units 01/16/2022   11:36 PM 12/12/2021    5:25 AM 12/11/2021     4:23 PM  BMP  Glucose 70 - 99 mg/dL 143  94  96   BUN 8 - 23 mg/dL 11  9  11    Creatinine 0.61 - 1.24 mg/dL 1.04  1.03  1.43   Sodium 135 - 145 mmol/L 138  138  136   Potassium 3.5 - 5.1 mmol/L 3.5  3.4  3.7   Chloride 98 - 111 mmol/L 104  107  104   CO2 22 - 32 mmol/L 27  25  21    Calcium 8.9 - 10.3 mg/dL 9.3  8.9  9.3     LFT    Latest Ref Rng & Units 01/16/2022   11:36 PM 12/11/2021    4:23 PM 11/26/2021    2:51 AM  Hepatic Function  Total Protein 6.5 - 8.1 g/dL 8.6  9.0  7.2   Albumin 3.5 - 5.0 g/dL 3.8  3.7  3.0   AST 15 - 41 U/L 38  54  30   ALT 0 - 44 U/L 32  32  27   Alk Phosphatase 38 - 126 U/L 88  104  83   Total Bilirubin 0.3 - 1.2 mg/dL 0.6  0.7  0.5      STUDIES: No results found.    Impression / Plan:   Daniel Knapp is a 71 y.o. male with multiple comorbidities, severe dementia, hep C cirrhosis, treatment nave, isolated gastric varices, unsuccessful coil embolization, recurrence of prostate cancer is admitted with bright red blood per rectum  Rectal bleeding Hemoglobin has been stable since admission, similar compared to prior hospitalizations.   Hemodynamically insignificant lower GI bleed, likely secondary to hemorrhoids Do not believe patient would be amenable for bowel prep in order to do colonoscopy or flexible sigmoidoscopy Recommend to advance diet as tolerated Recommend MiraLAX daily Do not recommend upper endoscopy at this time, unlikely upper GI bleed with normal BUN/creatinine and stable hemoglobin Given patient's history of multiple comorbidities and advanced dementia, palliative care consult is appropriate in order to address definite goals of care to prevent recurrent ER visits and hospitalizations   Thank you for involving me in the care of this patient.  GI will sign off at this time, please call us back with any questions or concerns    LOS: 0 days   Sherri Sear, MD  01/17/2022, 2:07 PM    Note: This dictation was prepared with  Dragon dictation along with smaller phrase technology. Any transcriptional errors that result from this process are unintentional.

## 2022-01-17 NOTE — ED Notes (Signed)
Report given to Earlie Server, RN.

## 2022-01-17 NOTE — NC FL2 (Signed)
[place] LEVEL OF CARE SCREENING TOOL     IDENTIFICATION  Patient Name: Daniel Knapp Birthdate: 1950-09-02 Sex: male Admission Date (Current Location): 01/17/2022  [facility] and [place] Number:  Engineering geologist and Address:  [facility], [address], [place], [place] [address]      Provider Number: [id number]  Attending Physician Name and Address:  Emeterio Reeve, DO  Relative Name and Phone Number:  [name] (Sister)   [phone] [facility])    Current Level of Care: Hospital Recommended Level of Care: Nursing Facility Prior Approval Number:    Date Approved/Denied:   PASRR Number: [id number]  Discharge Plan:  ([employer] home)    Current Diagnoses: Patient Active Problem List   Diagnosis Date Noted   Hematochezia 50/38/8828   Acute metabolic encephalopathy 00/34/9179   Bladder mass 12/11/2021   Hydronephrosis with urinary obstruction due to right ureteral calculus 12/11/2021   AKI (acute kidney injury) ([place]) 12/11/2021   Abnormal CT scan, gastrointestinal tract    Iron deficiency anemia 11/23/2021   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/20/21 11/23/2021   Cirrhosis ([place]) 11/23/2021   Upper GI bleed 11/23/2021   Symptomatic anemia 11/20/2021   HTN (hypertension)    Dementia ([place])    Hepatitis    Hypokalemia    Fall    Prostate cancer ([place]) 01/23/2016   BPH (benign prostatic hyperplasia) 08/17/2015   Urinary retention 11/10/2014   BPH (benign prostatic hypertrophy) with urinary retention 11/10/2014    Orientation RESPIRATION BLADDER Height & Weight     Self, Time, Situation, Place (Patient is forgetful and has expressive aphasia)  Normal (98% Room Air) Incontinent Weight: 82.8 kg Height:  '6\' 2"'$  (188 cm)  BEHAVIORAL SYMPTOMS/MOOD NEUROLOGICAL BOWEL NUTRITION STATUS   (Poor judgement, poor awareness, unable to follow commands)   Incontinent Diet  AMBULATORY STATUS COMMUNICATION OF NEEDS Skin    Extensive Assist Verbally Normal                       Personal Care Assistance Level of Assistance  Bathing, Feeding, Dressing Bathing Assistance: Limited assistance Feeding assistance: Limited assistance Dressing Assistance: Limited assistance     Functional Limitations Info  Sight, Hearing, Speech Sight Info: Impaired Hearing Info: Adequate Speech Info: Impaired (expressive aphasia)    SPECIAL CARE FACTORS FREQUENCY                       Contractures Contractures Info: Not present    Additional Factors Info  Code Status, Allergies Code Status Info: FULL CODE Allergies Info: No Known allergies           Current Medications (01/17/2022):  This is the current hospital active medication list Current Facility-Administered Medications  Medication Dose Route Frequency Provider Last Rate Last Admin   acetaminophen (TYLENOL) tablet 650 mg  650 mg Oral Q6H PRN Athena Masse, MD       Or   acetaminophen (TYLENOL) suppository 650 mg  650 mg Rectal Q6H PRN Athena Masse, MD       hydrALAZINE (APRESOLINE) injection 5 mg  5 mg Intravenous Q4H PRN Athena Masse, MD       lactated ringers infusion   Intravenous Continuous Athena Masse, MD   Stopped at 01/17/22 0751   ondansetron (ZOFRAN) tablet 4 mg  4 mg Oral Q6H PRN Athena Masse, MD       Or   ondansetron [facility]) injection  4 mg  4 mg Intravenous Q6H PRN Athena Masse, MD         Discharge Medications: Please see discharge summary for a list of discharge medications.  Relevant Imaging Results:  Relevant Lab Results:   Additional Information SSN [id number]  Pete Pelt, RN

## 2022-01-17 NOTE — Assessment & Plan Note (Signed)
Hydralazine IV as needed while n.p.o. to keep systolic under 503

## 2022-01-17 NOTE — H&P (Addendum)
History and Physical    Patient: Daniel Knapp ZOX:[id number] DOB: 10-16-1950 DOA: 01/17/2022 DOS: the patient was seen and examined on 01/17/2022 PCP: Derinda Late, MD  Patient coming from: Home  Chief Complaint:  Chief Complaint  Patient presents with   Rectal Bleeding    HPI: Daniel Knapp is a 71 y.o. 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 ED course and data review: Temperature 97.5 with pulse 96, BP 118/97 and O2 sat 98% on room air.  Labs significant for hemoglobin of 10.4, improved from his last discharge around 9.  Rectal exam showed bright red blood. The ED provider spoke with patient's sister who was interested in workup to find out the source of bleeding.  I subsequently spoke with sister who reviews DNR and would like to investigate and treat the cause of bleeding.  Patient will be admitted to med telemetry bed.  He was typed and crossed in the ED.      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-has seen Dr Delaine Lame   Prostate cancer [facility]) 02/2016   Prostate enlargement    Urinary catheter insertion/adjustment/removal    Urinary retention    UTI (lower urinary tract infection)    UTI symptoms    Weight loss    Past Surgical History:  Procedure Laterality Date   ESOPHAGOGASTRODUODENOSCOPY N/A 11/21/2021   Procedure: ESOPHAGOGASTRODUODENOSCOPY (EGD);  Surgeon: Annamaria Helling, DO;  Location: [facility] ENDOSCOPY;  Service: Gastroenterology;  Laterality: N/A;   IR ANGIOGRAM SELECTIVE EACH ADDITIONAL VESSEL  11/25/2021   IR EMBO ART  VEN HEMORR LYMPH EXTRAV  INC GUIDE ROADMAPPING  11/25/2021   IR TRANSHEPATIC PORTOGRAM WO HEMO  11/25/2021   IR US GUIDE VASC ACCESS RIGHT  11/25/2021   TRANSURETHRAL RESECTION OF BLADDER TUMOR N/A 08/17/2015   Procedure: TRANSURETHRAL RESECTION OF BLADDER TUMOR (TURBT);  Surgeon: [name], MD;  Location: [facility] ORS;  Service: Urology;  Laterality: N/A;   TRANSURETHRAL RESECTION OF PROSTATE N/A 08/17/2015   Procedure: TRANSURETHRAL RESECTION OF THE PROSTATE (TURP);  Surgeon: [name], MD;  Location: [facility] ORS;  Service: Urology;  Laterality: N/A;   XI ROBOTIC ASSISTED VENTRAL HERNIA N/A 09/30/2020   Procedure: XI ROBOTIC ASSISTED VENTRAL HERNIA;  Surgeon: Herbert Pun, MD;  Location: [facility] ORS;  Service: General;  Laterality: N/A;   Social History:  reports that he has never smoked. He has never used smokeless tobacco. He reports that he does not currently use alcohol. He reports that he does not currently use drugs after having used the following drugs: Marijuana.  No Known Allergies  Family History  Problem Relation Age of Onset   Heart disease Mother    Cancer Father    Kidney disease Neg Hx    Prostate cancer Neg Hx     Prior to Admission medications   Medication Sig Start Date End Date  Taking? Authorizing Provider  atenolol (TENORMIN) 25 MG tablet Take 25 mg by mouth daily. 10/06/21  Yes [provider]  cholecalciferol (VITAMIN D) 25 MCG (1000 UNIT) tablet Take 1,000 Units by mouth daily.   Yes [provider]  divalproex (DEPAKOTE ER) 250 MG 24 hr tablet Take 250 mg by mouth daily in the afternoon. 07/25/20  Yes [provider]  donepezil (ARICEPT) 10 MG tablet Take 10 mg by mouth at bedtime. 07/09/20  Yes [provider]  finasteride (PROSCAR) 5 MG tablet Take 5 mg  by mouth daily in the afternoon. Reported on 08/17/2015   Yes [provider]  memantine (NAMENDA) 5 MG tablet Take 5 mg by mouth 2 (two) times daily. 08/14/20  Yes [provider]  Multiple Vitamin (MULTIVITAMIN WITH MINERALS) TABS tablet Take 1 tablet by mouth daily in the afternoon.   Yes [provider]  pantoprazole (PROTONIX) 40 MG tablet Take 1 tablet (40 mg total) by mouth 2 (two) times daily before a meal. 11/27/21  Yes Thurnell Lose, MD  tamsulosin (FLOMAX) 0.4 MG CAPS capsule Take 0.4 mg by mouth daily in the afternoon. Reported on 09/01/2015   Yes [provider]  vitamin B-12 (CYANOCOBALAMIN) 1000 MCG tablet Take 1,000 mcg by mouth daily in the afternoon.   Yes [provider]    Physical Exam: Vitals:   01/16/22 2330 01/16/22 2333 01/17/22 0252 01/17/22 0400  BP:  (!) 118/97 (!) 139/90 (!) 145/92  Pulse:  96 79 95  Resp:  '20 18 16  '$ Temp:  (!) 97.5 F (36.4 C) 97.6 F (36.4 C)   TempSrc:  Axillary Axillary   SpO2:  98% 99% 98%  Weight: 87 kg     Height: '6\' 2"'$  (1.88 m)      Physical Exam Vitals and nursing note reviewed.  Constitutional:      General: He is not in acute distress. HENT:     Head: Normocephalic and atraumatic.  Cardiovascular:     Rate and Rhythm: Normal rate and regular rhythm.     Heart sounds: Normal heart sounds.  Pulmonary:     Effort: Pulmonary effort is normal.     Breath sounds: Normal breath sounds.  Abdominal:     Palpations: Abdomen is soft.     Tenderness: There is no abdominal tenderness.  Neurological:     Mental Status: Mental status is at baseline.     Labs on Admission: I have personally reviewed following labs and imaging studies  CBC: Recent Labs  Lab 01/16/22 2336  WBC 6.1  HGB 10.4*  HCT 35.2*  MCV 79.1*  PLT 660*   Basic Metabolic Panel: Recent Labs  Lab 01/16/22 2336  NA 138  K 3.5  CL 104  CO2 27  GLUCOSE 143*  BUN 11  CREATININE 1.04  CALCIUM 9.3    GFR: Estimated Creatinine Clearance: 75.7 mL/min (by C-G formula based on SCr of 1.04 mg/dL). Liver Function Tests: Recent Labs  Lab 01/16/22 2336  AST 38  ALT 32  ALKPHOS 88  BILITOT 0.6  PROT 8.6*  ALBUMIN 3.8   No results for input(s): "LIPASE", "AMYLASE" in the last 168 hours. No results for input(s): "AMMONIA" in the last 168 hours. Coagulation Profile: No results for input(s): "INR", "PROTIME" in the last 168 hours. Cardiac Enzymes: No results for input(s): "CKTOTAL", "CKMB", "CKMBINDEX", "TROPONINI" in the last 168 hours. BNP (last 3 results) No results for input(s): "PROBNP" in the last 8760 hours. HbA1C:  No results for input(s): "HGBA1C" in the last 72 hours. CBG: No results for input(s): "GLUCAP" in the last 168 hours. Lipid Profile: No results for input(s): "CHOL", "HDL", "LDLCALC", "TRIG", "CHOLHDL", "LDLDIRECT" in the last 72 hours. Thyroid Function Tests: No results for input(s): "TSH", "T4TOTAL", "FREET4", "T3FREE", "THYROIDAB" in the last 72 hours. Anemia Panel: No results for input(s): "VITAMINB12", "FOLATE", "FERRITIN", "TIBC", "IRON", "RETICCTPCT" in the last 72 hours. Urine analysis:    Component Value Date/Time   COLORURINE AMBER (A) 12/14/2021 1812   APPEARANCEUR CLOUDY (A) 12/14/2021 1812   APPEARANCEUR Cloudy (A) 09/01/2015 1015   LABSPEC 1.021 12/14/2021 1812   LABSPEC 1.014 09/17/2014 2237   PHURINE 5.0 12/14/2021 1812   GLUCOSEU NEGATIVE 12/14/2021 1812   GLUCOSEU Negative 09/17/2014 2237   HGBUR MODERATE (A) 12/14/2021 1812   BILIRUBINUR NEGATIVE 12/14/2021 1812   BILIRUBINUR Negative 09/01/2015 1015   BILIRUBINUR Negative 09/17/2014 2237   KETONESUR NEGATIVE 12/14/2021 1812   PROTEINUR 100 (A) 12/14/2021 1812   NITRITE NEGATIVE 12/14/2021 1812   LEUKOCYTESUR SMALL (A) 12/14/2021 1812   LEUKOCYTESUR Negative 09/17/2014 2237    Radiological Exams on Admission: No results found.   Data Reviewed: Relevant notes from primary care  and specialist visits, past discharge summaries as available in EHR, including Care Everywhere. Prior diagnostic testing as pertinent to current admission diagnoses Updated medications and problem lists for reconciliation ED course, including vitals, labs, imaging, treatment and response to treatment Triage notes, nursing and pharmacy notes and ED provider's notes Notable results as noted in HPI   Assessment and Plan: * Hematochezia History of bleeding gastric varix June 2023 Patient with bright red blood per rectum, stable hemoglobin and vitals Serial H&H and transfuse if needed IV hydration Keep n.p.o. GI consulted.  Discussed with sister and POA who would like to find out where the bleeding is coming from and have it fixed if possible.  She reversed DNR at this time  HTN (hypertension) Hydralazine IV as needed while n.p.o. to keep systolic under 299  Dementia (HCC) Hold Depakote, Aricept and Namenda while n.p.o. Delirium precautions --Get up during the day --Keep blinds open and lights on during daylight hours --Minimize the use of opioids/benzodiazepines --Reorient the patient frequently, provide easily visible clock and calendar --Provide sensory aids like glasses, hearing aids --Encourage ambulation, regular activities and visitors to maintain cognitive stimulation  --Patient would benefit from having family members at bedside to reinforce his orientation.   Prostate cancer [facility]) Recent suspected local recurrence of prostate cancer 12/2021  (no intervention planned by urology),        DVT prophylaxis: SCDs  Consults: GI  Advance Care Planning: Full code  Family Communication: Spoke with sister extensively on the phone.  She would like patient to have workup with gastroenterology to investigate the cause of the bleeding and address it possible.  Prior DNR was reversed  Disposition Plan: Back to previous home environment  Severity of Illness: The appropriate  patient status for this patient is OBSERVATION. Observation status is judged to be reasonable and necessary in order to provide the required intensity of service to ensure the patient's safety. The patient's presenting symptoms, physical exam findings, and initial radiographic and laboratory data in the context of their medical condition is felt to place them at decreased risk for further clinical deterioration. Furthermore, it is anticipated that the patient will be medically stable for discharge from the hospital within 2 midnights of admission.   Author: Athena Masse, MD 01/17/2022 5:01 AM  For on call review [email or web address].

## 2022-01-17 NOTE — Assessment & Plan Note (Signed)
Hold Depakote, Aricept and Namenda while n.p.o. Delirium precautions --Get up during the day --Keep blinds open and lights on during daylight hours --Minimize the use of opioids/benzodiazepines --Reorient the patient frequently, provide easily visible clock and calendar --Provide sensory aids like glasses, hearing aids --Encourage ambulation, regular activities and visitors to maintain cognitive stimulation  --Patient would benefit from having family members at bedside to reinforce his orientation.

## 2022-01-17 NOTE — ED Notes (Signed)
Request made for transport to the floor ?

## 2022-01-17 NOTE — Assessment & Plan Note (Addendum)
Recent suspected local recurrence of prostate cancer 12/2021  (no intervention planned by urology),

## 2022-01-17 NOTE — Hospital Course (Addendum)
Daniel Knapp is a 71 y.o. 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 08/08: Hgb 10.4, BRBPR. Admitting hospitalist and ER provider spoke with sister who reversed DNR and would like to investigate and treat the cause of bleeding.  Patient admitted.  He was typed and crossed in the ED. following H&H, keeping n.p.o., GI consulted: Hemoglobin stable, hemodynamically insignificant lower GI bleed likely secondary to hemorrhoids, GI does not believe patient would be amenable for bowel prep in order to do colonoscopy or flexible sigmoid, recommend advance diet as tolerated, MiraLAX daily, unlikely upper GI bleed would not recommend upper endoscopy

## 2022-01-17 NOTE — ED Notes (Signed)
Pts provided blankets; brief dry at this time. No concerns at this time.

## 2022-01-17 NOTE — ED Notes (Signed)
Pt placed in a clean brief PTA - no blood stools present at this time.

## 2022-01-17 NOTE — Progress Notes (Addendum)
  Brief Progress Note (See full H&P from earlier today)   Subjective: Unable to assess due to patient condition/dementia  Objective: Relevant new results:  Hemoglobin relatively stable, 10.4 on admission, 9.3 and 9.6 on repeat.  Physical Exam:  BP 138/82 (BP Location: Left Arm)   Pulse 81   Temp 97.6 F (36.4 C) (Oral)   Resp 16   Ht '6\' 2"'$  (1.88 m)   Wt 82.8 kg   SpO2 98%   BMI 23.44 kg/m  Constitutional:  General Appearance: lethargic but rousable, NAD Respiratory: Normal respiratory effort Breath sounds normal Cardiovascular: S1/S2 normal, RRR No lower extremity edema Gastrointestinal: Nontender, no masses Musculoskeletal:  No cyanosis of digits     Assessment/Plan changes or updates compared to H&P: No changes, see H&P Pending GI evaluation --> no procedures planned Continue to monitor H/H --> if remains stable and no further symptoms, can discharge tomorrow morning I spoke with patient's sister, no concerns, all questions answered. Likely plan for DC tomorrow if H/H remains stable and can arrange transport back to facility

## 2022-01-17 NOTE — Assessment & Plan Note (Addendum)
History of bleeding gastric varix June 2023 Patient with bright red blood per rectum, stable hemoglobin and vitals Serial H&H and transfuse if needed IV hydration Keep n.p.o. GI consulted.  Discussed with sister and POA who would like to find out where the bleeding is coming from and have it fixed if possible.  She reversed DNR at this time

## 2022-01-17 NOTE — ED Provider Notes (Signed)
[facility] Provider Note    Event Date/Time   First MD Initiated Contact with Patient 01/17/22 [id number]     (approximate)   History   Rectal Bleeding   HPI  Daniel Knapp is a 71 y.o. male with past medical history of advanced dementia, hypertension, prostate cancer cirrhosis with history of bleeding gastric varices presents with rectal bleeding.  Patient currently is residing at [facility].  He had a recent admission in June for gastric varices during which she required 4 units of PRBCs and had an embolization.  He was discharged and readmitted for encephalopathy in the setting of ureteral stone.  Patient is discharged back to [facility] currently on comfort care however his POA tells me that he is still getting medical care and is not on hospice.  They are planning to talk to hospice but she still wants him to get treatment for acute medical issues such as blood products or potentially a procedure such as colonoscopy.  Today the facility noticed bright red blood in his diaper.  Patient is unable provide history due to advanced dementia.     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-has seen Dr Delaine Lame   Prostate cancer [facility]) 02/2016   Prostate enlargement    Urinary catheter insertion/adjustment/removal    Urinary retention    UTI (lower urinary tract infection)    UTI symptoms    Weight loss     Patient Active Problem List   Diagnosis Date Noted   Acute metabolic encephalopathy 46/80/3212   Bladder mass 12/11/2021   Hydronephrosis with urinary obstruction due to right ureteral calculus 12/11/2021   AKI (acute kidney injury) ([place]) 12/11/2021   Abnormal CT scan, gastrointestinal tract    Iron deficiency anemia 11/23/2021   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/20/21  11/23/2021   Cirrhosis ([place]) 11/23/2021   Upper GI bleed 11/23/2021   Symptomatic anemia 11/20/2021   HTN (hypertension)    Dementia ([place])    Hepatitis    Hypokalemia    Fall    Prostate cancer ([place]) 01/23/2016   BPH (benign prostatic hyperplasia) 08/17/2015   Urinary retention 11/10/2014   BPH (benign prostatic hypertrophy) with urinary retention 11/10/2014     Physical Exam  Triage Vital Signs: ED Triage Vitals  Enc Vitals Group     BP 01/16/22 2333 (!) 118/97     Pulse Rate 01/16/22 2333 96     Resp 01/16/22 2333 20     Temp 01/16/22 2333 (!) 97.5 F (36.4 C)     Temp Source 01/16/22 2333 Axillary     SpO2 01/16/22 2333 98 %     Weight 01/16/22 2330 191 lb 12.8 oz (87 kg)     Height 01/16/22 2330 '6\' 2"'$  (1.88 m)     Head Circumference --      Peak Flow --      Pain Score 01/16/22 2329 0     Pain Loc --      Pain Edu? --      Excl. in [place]? --     Most recent vital signs: Vitals:   01/17/22 0252 01/17/22 0400  BP: (!) 139/90 (!) 145/92  Pulse: 79 95  Resp: 18 16  Temp: 97.6 F (36.4 C)   SpO2: 99% 98%     General: Awake, no distress.  Chronically ill-appearing CV:  Good peripheral perfusion.  Resp:  Normal effort.  Abd:  No distention.  Neuro:             Awake, eyes are open, patient becomes agitated with physical exam, seems to move all extremities Other:  Dark red blood on rectal exam   ED Results / Procedures / Treatments  Labs (all labs ordered are listed, but only abnormal results are displayed) Labs Reviewed  COMPREHENSIVE METABOLIC PANEL - Abnormal; Notable for the following components:      Result Value   Glucose, Bld 143 (*)    Total Protein 8.6 (*)    All other components within normal limits  CBC - Abnormal; Notable for the following components:   Hemoglobin 10.4 (*)    HCT 35.2 (*)    MCV 79.1 (*)    MCH 23.4 (*)    MCHC 29.5 (*)    RDW 18.9 (*)    Platelets 104 (*)    All other components within normal limits  PROTIME-INR  POC  OCCULT BLOOD, ED  TYPE AND SCREEN     EKG     RADIOLOGY    PROCEDURES:  Critical Care performed: Yes, see critical care procedure note(s)  Procedures  The patient is on the cardiac monitor to evaluate for evidence of arrhythmia and/or significant heart rate changes.   MEDICATIONS ORDERED IN ED: Medications - No data to display   IMPRESSION / MDM / [place] / ED COURSE  I reviewed the triage vital signs and the nursing notes.                              Patient's presentation is most consistent with acute presentation with potential threat to life or bodily function.  Differential diagnosis includes, but is not limited to, lower GI bleed secondary to hemorrhoidal bleeding, diverticular hemorrhage, AVM, angiodysplasia, brisk upper GI bleed  Patient is a 71 year old male with multiple comorbidities including significant cirrhosis who presents today with hematochezia.  He is currently residing at [facility].  History from triage says that patient is comfort care however I spoke to [facility] his sister who is his POA tells me he is not on comfort care but is DNR/DNI.  I asked her specifically if he were to require a blood transfusion if that will be something she would want she said she would and she also said she would want colonoscopy or endoscopy if he needed it.  I explained to her that patient is quite uncomfortable when we are examining him and I am concerned that these interventions would potentially cause more harm than good.  She expressed understanding of this as well.  She was not however wanting to make him for hospice or comfort care at this time and does want him to be admitted least for observation.  I explained that the best thing would probably be to observe him and if he remains stable to avoid doing any invasive procedures such as colonoscopy or endoscopy but if that his hemodynamics or hemoglobin were to change significantly then this would need to be  reassessed and it would be a discussion with GI whether they would even offer him an invasive procedure.  Hemoglobin today is 10 which is actually higher than it has been in the past.  CMP  overall is unremarkable.  He is hemodynamically stable.  I will discuss with the hospitalist for admission type and screen has been sent.       FINAL CLINICAL IMPRESSION(S) / ED DIAGNOSES   Final diagnoses:  Rectal bleeding     Rx / DC Orders   ED Discharge Orders     None        Note:  This document was prepared using Dragon voice recognition software and may include unintentional dictation errors.   Rada Hay, MD 01/17/22 [phone]

## 2022-01-17 NOTE — TOC Initial Note (Signed)
Transition of Care [facility]) - Initial/Assessment Note    Patient Details  Name: Daniel Knapp MRN: [id number] Date of Birth: 05-13-1951  Transition [facility]) CM/SW Contact:    Pete Pelt, RN Phone Number: 01/17/2022, 11:41 AM  Clinical Narrative:  Patient is a current resident of [facility], [place], [place] reached out to facility, and patient is eligible for return as per Neoma Laming at facility.  TOC to follow.                 Expected Discharge Plan: Long Term Nursing Home Barriers to Discharge: Continued Medical Work up   Patient Goals and CMS Choice        Expected Discharge Plan and Services Expected Discharge Plan: [place]       Living arrangements for the past 2 months: [place]                                      Prior Living Arrangements/Services Living arrangements for the past 2 months: [place] Lives with:: Facility Resident Patient language and need for interpreter reviewed:: Yes Do you feel safe going back to the place where you live?: Yes      Need for Family Participation in Patient Care: Yes (Comment) Care giver support system in place?: Yes (comment)   Criminal Activity/Legal Involvement Pertinent to Current Situation/Hospitalization: No - Comment as needed  Activities of Daily Living Home Assistive Devices/Equipment: Hoyer Lift ADL Screening (condition at time of admission) Patient's cognitive ability adequate to safely complete daily activities?: No Is the patient deaf or have difficulty hearing?: No Does the patient have difficulty seeing, even when wearing glasses/contacts?: No Does the patient have difficulty concentrating, remembering, or making decisions?: Yes Patient able to express need for assistance with ADLs?: No Does the patient have difficulty dressing or bathing?: Yes Independently performs ADLs?: No Communication: Independent Dressing (OT): Needs  assistance Is this a change from baseline?: Pre-admission baseline Grooming: Needs assistance Is this a change from baseline?: Pre-admission baseline Feeding: Needs assistance Is this a change from baseline?: Pre-admission baseline Bathing: Dependent Is this a change from baseline?: Pre-admission baseline Toileting: Dependent Is this a change from baseline?: Pre-admission baseline In/Out Bed: Dependent Is this a change from baseline?: Pre-admission baseline Walks in Home: Dependent Is this a change from baseline?: Pre-admission baseline Does the patient have difficulty walking or climbing stairs?: Yes Weakness of Legs: Both Weakness of Arms/Hands: None  Permission Sought/Granted Permission sought to share information with : Case Manager, Investment banker, corporate granted to share info w AGENCY: [facility]        Emotional Assessment Appearance:: Appears stated age Attitude/Demeanor/Rapport:  (Disoriented x 4)     Alcohol / Substance Use: Not Applicable Psych Involvement: No (comment)  Admission diagnosis:  Hematochezia [K92.1] Rectal bleeding [K62.5] Patient Active Problem List   Diagnosis Date Noted   Hematochezia 18/29/9371   Acute metabolic encephalopathy 69/67/8938   Bladder mass 12/11/2021   Hydronephrosis with urinary obstruction due to right ureteral calculus 12/11/2021   AKI (acute kidney injury) ([place]) 12/11/2021   Abnormal CT scan, gastrointestinal tract    Iron deficiency anemia 11/23/2021   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/20/21 11/23/2021   Cirrhosis ([place]) 11/23/2021   Upper GI bleed 11/23/2021   Symptomatic anemia 11/20/2021   HTN (hypertension)  Dementia ([place])    Hepatitis    Hypokalemia    Fall    Prostate cancer ([place]) 01/23/2016   BPH (benign prostatic hyperplasia) 08/17/2015   Urinary retention 11/10/2014   BPH (benign prostatic hypertrophy) with urinary retention 11/10/2014   PCP:  Derinda Late, MD Pharmacy:    [facility], [place] - [address] [place] [address] Phone: [phone] Fax: [phone]  [id number] [place] [id number]: [phone]: [address]: [phone]: [phone]    Social Determinants of Health (SDOH) Interventions    Readmission Risk Interventions     No data to display

## 2022-01-18 DIAGNOSIS — Z743 Need for continuous supervision: Secondary | ICD-10-CM | POA: Diagnosis not present

## 2022-01-18 DIAGNOSIS — R531 Weakness: Secondary | ICD-10-CM | POA: Diagnosis not present

## 2022-01-18 DIAGNOSIS — K921 Melena: Secondary | ICD-10-CM | POA: Diagnosis not present

## 2022-01-18 LAB — BASIC METABOLIC PANEL
Anion gap: 6 (ref 5–15)
BUN: 7 mg/dL — ABNORMAL LOW (ref 8–23)
CO2: 25 mmol/L (ref 22–32)
Calcium: 8.9 mg/dL (ref 8.9–10.3)
Chloride: 106 mmol/L (ref 98–111)
Creatinine, Ser: 0.87 mg/dL (ref 0.61–1.24)
GFR, Estimated: >60 mL/min (ref >60)
Glucose, Bld: 107 mg/dL — ABNORMAL HIGH (ref 70–99)
Potassium: 3.2 mmol/L — ABNORMAL LOW (ref 3.5–5.1)
Sodium: 137 mmol/L (ref 135–145)

## 2022-01-18 LAB — CBC
HCT: 29.5 % — ABNORMAL LOW (ref 39.0–52.0)
Hemoglobin: 8.9 g/dL — ABNORMAL LOW (ref 13.0–17.0)
MCH: 23.2 pg — ABNORMAL LOW (ref 26.0–34.0)
MCHC: 30.2 g/dL (ref 30.0–36.0)
MCV: 76.8 fL — ABNORMAL LOW (ref 80.0–100.0)
Platelets: 114 10*3/uL — ABNORMAL LOW (ref 150–400)
RBC: 3.84 MIL/uL — ABNORMAL LOW (ref 4.22–5.81)
RDW: 19.1 % — ABNORMAL HIGH (ref 11.5–15.5)
WBC: 3.8 10*3/uL — ABNORMAL LOW (ref 4.0–10.5)
nRBC: 0 % (ref 0.0–0.2)

## 2022-01-18 LAB — FERRITIN: Ferritin: 14 ng/mL — ABNORMAL LOW (ref 24–336)

## 2022-01-18 LAB — IRON AND TIBC
Iron: 20 ug/dL — ABNORMAL LOW (ref 45–182)
Saturation Ratios: 6 % — ABNORMAL LOW (ref 17.9–39.5)
TIBC: 342 ug/dL (ref 250–450)
UIBC: 322 ug/dL

## 2022-01-18 MED ORDER — SODIUM CHLORIDE 0.9 % IV SOLN
300.0000 mg | Freq: Once | INTRAVENOUS | Status: AC
  Administered 2022-01-18: 300 mg via INTRAVENOUS
  Filled 2022-01-18: qty 15

## 2022-01-18 MED ORDER — POTASSIUM CHLORIDE CRYS ER 20 MEQ PO TBCR
40.0000 meq | EXTENDED_RELEASE_TABLET | Freq: Once | ORAL | Status: AC
  Administered 2022-01-18: 40 meq via ORAL
  Filled 2022-01-18: qty 2

## 2022-01-18 MED ORDER — POLYETHYLENE GLYCOL 3350 17 G PO PACK: 17.0000 g | PACK | Freq: Two times a day (BID) | ORAL | 11 refills | Status: DC

## 2022-01-18 NOTE — Progress Notes (Signed)
Report given to [place], Therapist, sports at [facility]. EMS to pick patient up. Pt bathed and PIV removed prior to transport.

## 2022-01-18 NOTE — TOC Progression Note (Signed)
Transition of Care [facility]) - Progression Note    Patient Details  Name: Daniel Knapp MRN: [id number] Date of Birth: 04-28-1951  Transition of Care [facility]) CM/SW [place], RN Phone Number: 01/18/2022, 1:13 PM  Clinical Narrative:   Patient has been discharged, will transport via EMS back to [facility] today, D. Meyers aware.  Message left for sister.    Expected Discharge Plan: Long Term Nursing Home Barriers to Discharge: Continued Medical Work up  Expected Discharge Plan and Services Expected Discharge Plan: [place]       Living arrangements for the past 2 months: [place] Expected Discharge Date: 01/18/22                                     Social Determinants of Health (SDOH) Interventions    Readmission Risk Interventions     No data to display

## 2022-01-18 NOTE — Discharge Summary (Signed)
Physician Discharge Summary   Patient: Daniel Knapp MRN: [id number]  DOB: 12-Apr-1951   Admit:     Date of Admission: 01/17/2022 Admitted from: SNF [facility])   Discharge: Date of discharge: 01/18/22 Disposition: Skilled nursing facility Condition at discharge: fair  CODE STATUS: FULL - confirmed w/ sister 01/18/22      Discharge Physician: Emeterio Reeve, DO [facility]     PCP: Derinda Late, MD  Recommendations for Outpatient Follow-up:  Follow up with PCP Derinda Late, MD in 1-2 weeks Please obtain labs/tests: CBC, Ferritin, Iron, BMP in 1 weeks  Please follow up on the following pending results: none PCP AND OTHER OUTPATIENT PROVIDERS: SEE BELOW FOR SPECIFIC DISCHARGE INSTRUCTIONS PRINTED FOR SNF IN ADDITION TO GENERIC AVS PATIENT INFO    Discharge Instructions     Call MD for:  difficulty breathing, headache or visual disturbances   Complete by: As directed    Call MD for:  persistant dizziness or light-headedness   Complete by: As directed    Diet - low sodium heart healthy   Complete by: As directed    Discharge instructions   Complete by: As directed    Repeat CBC and Iron, Ferritin no later than Monday 01/22/2022. If Hgb <7 please transfer to ED for transfusion.  Monitor for severe bleeding.  Hemorrhoids - expect occasional small amount red blood in BM / bloody streaked stool.  If severe bleeding / black or maroon stool please seek emergency medical care.   Increase activity slowly   Complete by: As directed           Hospital Course: Daniel Knapp is a 71 y.o. male with medical history significant for Dementia, untreated hepatitis C and cirrhosis, hypertension, history of bleeding gastric varices in June 2023 requiring 4 units PRBCs, hospitalized in July 2023 with hydronephrosis secondary to recurrence of prostate cancer (no intervention planned by urology due to goals of care), and discharged to hospice, who presents to the  ED late 01/16/2022 from the nursing home after they noticed he had a bloody bowel movement in his diaper. 08/08: Hgb 10.4, BRBPR. Admitting hospitalist and ER provider spoke with sister who reversed DNR and would like to investigate and treat the cause of bleeding.  Patient admitted.  He was typed and crossed in the ED. following H&H, keeping n.p.o., GI consulted 08/09: Hemoglobin stable, hemodynamically insignificant lower GI bleed likely secondary to hemorrhoids, GI does not believe patient would be amenable for bowel prep in order to do colonoscopy or flexible sigmoid, recommend advance diet as tolerated, MiraLAX daily, unlikely upper GI bleed would not recommend upper endoscopy 08/10: no bleeding, slight drop Hgb, low iron and ferritin --> IV iron ordered and plan for DC back to facility.    Consultants:  GI  Procedures:  none      Discharge Diagnoses: Principal Problem:   Hematochezia Active Problems:   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/20/21   HTN (hypertension)   Dementia (H[facility])   Prostate cancer ([place])   Iron deficiency anemia   Cirrhosis ([place])    Assessment & Plan:  Hematochezia History of bleeding gastric varix June 2023 Iron deficiency anemia Patient with bright red blood per rectum, stable hemoglobin and vitals Serial H&H and transfuse if needed --> no need, and no GI procedures Iron infusion and outpatient follow up   Dementia (HCC) Hold Depakote, Aricept and Namenda while n.p.o. --> resume and diet on discharge  Delirium precautions  HTN (hypertension) Hydralazine  IV as needed while n.p.o. to keep systolic under 287  Prostate cancer ([place]) Recent suspected local recurrence of prostate cancer 12/2021  (no intervention planned by urology),      Discharge Instructions  Allergies as of 01/18/2022   No Known Allergies      Medication List     STOP taking these medications    atenolol 25 MG tablet Commonly known as: TENORMIN       TAKE these  medications    cholecalciferol 25 MCG (1000 UNIT) tablet Commonly known as: VITAMIN D3 Take 1,000 Units by mouth daily.   cyanocobalamin 1000 MCG tablet Commonly known as: VITAMIN B12 Take 1,000 mcg by mouth daily in the afternoon.   divalproex 250 MG 24 hr tablet Commonly known as: DEPAKOTE ER Take 250 mg by mouth daily in the afternoon.   donepezil 10 MG tablet Commonly known as: ARICEPT Take 10 mg by mouth at bedtime.   finasteride 5 MG tablet Commonly known as: PROSCAR Take 5 mg by mouth daily in the afternoon. Reported on 08/17/2015   memantine 5 MG tablet Commonly known as: NAMENDA Take 5 mg by mouth 2 (two) times daily.   multivitamin with minerals Tabs tablet Take 1 tablet by mouth daily in the afternoon.   pantoprazole 40 MG tablet Commonly known as: Protonix Take 1 tablet (40 mg total) by mouth 2 (two) times daily before a meal.   polyethylene glycol 17 g packet Commonly known as: MIRALAX / GLYCOLAX Take 17 g by mouth 2 (two) times daily. Until stooling regularly/soft stool then can decrease to daily or every other day   tamsulosin 0.4 MG Caps capsule Commonly known as: FLOMAX Take 0.4 mg by mouth daily in the afternoon. Reported on 09/01/2015          No Known Allergies   Subjective: unable to perform d/t patient dementia    Discharge Exam: BP 133/80 (BP Location: Left Arm)   Pulse 79   Temp 98 F (36.7 C)   Resp 18   Ht '6\' 2"'$  (1.88 m)   Wt 82.8 kg   SpO2 99%   BMI 23.44 kg/m  General: Pt is alert, not in acute distress Cardiovascular: RRR, S1/S2 +, no rubs, no gallops Respiratory: CTA bilaterally, no wheezing, no rhonchi Abdominal: Soft, NT, ND, bowel sounds + Extremities: no edema, no cyanosis     The results of significant diagnostics from this hospitalization (including imaging, microbiology, ancillary and laboratory) are listed below for reference.     Microbiology: No results found for this or any previous visit (from the past  240 hour(s)).   Labs: BNP (last 3 results) No results for input(s): "BNP" in the last 8760 hours. Basic Metabolic Panel: Recent Labs  Lab 01/16/22 2336 01/18/22 0529  NA 138 137  K 3.5 3.2*  CL 104 106  CO2 27 25  GLUCOSE 143* 107*  BUN 11 7*  CREATININE 1.04 0.87  CALCIUM 9.3 8.9   Liver Function Tests: Recent Labs  Lab 01/16/22 2336  AST 38  ALT 32  ALKPHOS 88  BILITOT 0.6  PROT 8.6*  ALBUMIN 3.8   No results for input(s): "LIPASE", "AMYLASE" in the last 168 hours. No results for input(s): "AMMONIA" in the last 168 hours. CBC: Recent Labs  Lab 01/16/22 2336 01/17/22 0531 01/17/22 1146 01/17/22 1458 01/18/22 0529  WBC 6.1  --   --   --  3.8*  HGB 10.4* 9.3* 9.6* 9.4* 8.9*  HCT 35.2* 31.3* 31.6* 30.8*  29.5*  MCV 79.1*  --   --   --  76.8*  PLT 104*  --   --   --  114*   Cardiac Enzymes: No results for input(s): "CKTOTAL", "CKMB", "CKMBINDEX", "TROPONINI" in the last 168 hours. BNP: Invalid input(s): "POCBNP" CBG: No results for input(s): "GLUCAP" in the last 168 hours. D-Dimer No results for input(s): "DDIMER" in the last 72 hours. Hgb A1c No results for input(s): "HGBA1C" in the last 72 hours. Lipid Profile No results for input(s): "CHOL", "HDL", "LDLCALC", "TRIG", "CHOLHDL", "LDLDIRECT" in the last 72 hours. Thyroid function studies No results for input(s): "TSH", "T4TOTAL", "T3FREE", "THYROIDAB" in the last 72 hours.  Invalid input(s): "FREET3" Anemia work up Recent Labs    01/18/22 0529  FERRITIN 14*  TIBC 342  IRON 20*   Urinalysis    Component Value Date/Time   COLORURINE AMBER (A) 12/14/2021 1812   APPEARANCEUR CLOUDY (A) 12/14/2021 1812   APPEARANCEUR Cloudy (A) 09/01/2015 1015   LABSPEC 1.021 12/14/2021 1812   LABSPEC 1.014 09/17/2014 2237   PHURINE 5.0 12/14/2021 1812   GLUCOSEU NEGATIVE 12/14/2021 1812   GLUCOSEU Negative 09/17/2014 2237   HGBUR MODERATE (A) 12/14/2021 1812   BILIRUBINUR NEGATIVE 12/14/2021 1812    BILIRUBINUR Negative 09/01/2015 1015   BILIRUBINUR Negative 09/17/2014 2237   KETONESUR NEGATIVE 12/14/2021 1812   PROTEINUR 100 (A) 12/14/2021 1812   NITRITE NEGATIVE 12/14/2021 1812   LEUKOCYTESUR SMALL (A) 12/14/2021 1812   LEUKOCYTESUR Negative 09/17/2014 2237   Sepsis Labs Recent Labs  Lab 01/16/22 2336 01/18/22 0529  WBC 6.1 3.8*   Microbiology No results found for this or any previous visit (from the past 240 hour(s)). Imaging No results found.    Time coordinating discharge: Over 30 minutes  SIGNED:  Emeterio Reeve DO [facility]

## 2022-01-24 DIAGNOSIS — K649 Unspecified hemorrhoids: Secondary | ICD-10-CM | POA: Diagnosis not present

## 2022-01-24 DIAGNOSIS — N400 Enlarged prostate without lower urinary tract symptoms: Secondary | ICD-10-CM | POA: Diagnosis not present

## 2022-01-24 DIAGNOSIS — D509 Iron deficiency anemia, unspecified: Secondary | ICD-10-CM | POA: Diagnosis not present

## 2022-01-24 DIAGNOSIS — I10 Essential (primary) hypertension: Secondary | ICD-10-CM | POA: Diagnosis not present

## 2022-01-24 DIAGNOSIS — K921 Melena: Secondary | ICD-10-CM | POA: Diagnosis not present

## 2022-01-24 DIAGNOSIS — G309 Alzheimer's disease, unspecified: Secondary | ICD-10-CM | POA: Diagnosis not present

## 2022-01-24 DIAGNOSIS — I864 Gastric varices: Secondary | ICD-10-CM | POA: Diagnosis not present

## 2022-01-24 DIAGNOSIS — F0280 Dementia in other diseases classified elsewhere without behavioral disturbance: Secondary | ICD-10-CM | POA: Diagnosis not present

## 2022-01-25 DIAGNOSIS — B1920 Unspecified viral hepatitis C without hepatic coma: Secondary | ICD-10-CM | POA: Diagnosis not present

## 2022-01-25 DIAGNOSIS — Z8719 Personal history of other diseases of the digestive system: Secondary | ICD-10-CM | POA: Diagnosis not present

## 2022-01-25 DIAGNOSIS — F0390 Unspecified dementia without behavioral disturbance: Secondary | ICD-10-CM | POA: Diagnosis not present

## 2022-01-25 DIAGNOSIS — D649 Anemia, unspecified: Secondary | ICD-10-CM | POA: Diagnosis not present

## 2022-01-25 DIAGNOSIS — K7460 Unspecified cirrhosis of liver: Secondary | ICD-10-CM | POA: Diagnosis not present

## 2022-01-25 DIAGNOSIS — I10 Essential (primary) hypertension: Secondary | ICD-10-CM | POA: Diagnosis not present

## 2022-01-25 DIAGNOSIS — D509 Iron deficiency anemia, unspecified: Secondary | ICD-10-CM | POA: Diagnosis not present

## 2022-01-31 DIAGNOSIS — H2513 Age-related nuclear cataract, bilateral: Secondary | ICD-10-CM | POA: Diagnosis not present

## 2022-02-28 DIAGNOSIS — M6281 Muscle weakness (generalized): Secondary | ICD-10-CM | POA: Diagnosis not present

## 2022-03-01 DIAGNOSIS — N400 Enlarged prostate without lower urinary tract symptoms: Secondary | ICD-10-CM | POA: Diagnosis not present

## 2022-03-01 DIAGNOSIS — I10 Essential (primary) hypertension: Secondary | ICD-10-CM | POA: Diagnosis not present

## 2022-03-01 DIAGNOSIS — M6281 Muscle weakness (generalized): Secondary | ICD-10-CM | POA: Diagnosis not present

## 2022-03-01 DIAGNOSIS — I864 Gastric varices: Secondary | ICD-10-CM | POA: Diagnosis not present

## 2022-03-01 DIAGNOSIS — F0280 Dementia in other diseases classified elsewhere without behavioral disturbance: Secondary | ICD-10-CM | POA: Diagnosis not present

## 2022-03-02 DIAGNOSIS — M6281 Muscle weakness (generalized): Secondary | ICD-10-CM | POA: Diagnosis not present

## 2022-03-03 DIAGNOSIS — M6281 Muscle weakness (generalized): Secondary | ICD-10-CM | POA: Diagnosis not present

## 2022-03-05 DIAGNOSIS — M6281 Muscle weakness (generalized): Secondary | ICD-10-CM | POA: Diagnosis not present

## 2022-03-06 DIAGNOSIS — M6281 Muscle weakness (generalized): Secondary | ICD-10-CM | POA: Diagnosis not present

## 2022-03-07 DIAGNOSIS — M6281 Muscle weakness (generalized): Secondary | ICD-10-CM | POA: Diagnosis not present

## 2022-03-09 DIAGNOSIS — M6281 Muscle weakness (generalized): Secondary | ICD-10-CM | POA: Diagnosis not present

## 2022-03-12 DIAGNOSIS — M6281 Muscle weakness (generalized): Secondary | ICD-10-CM | POA: Diagnosis not present

## 2022-03-13 DIAGNOSIS — M6281 Muscle weakness (generalized): Secondary | ICD-10-CM | POA: Diagnosis not present

## 2022-03-14 DIAGNOSIS — M6281 Muscle weakness (generalized): Secondary | ICD-10-CM | POA: Diagnosis not present

## 2022-03-15 DIAGNOSIS — M6281 Muscle weakness (generalized): Secondary | ICD-10-CM | POA: Diagnosis not present

## 2022-03-19 DIAGNOSIS — M6281 Muscle weakness (generalized): Secondary | ICD-10-CM | POA: Diagnosis not present

## 2022-03-20 DIAGNOSIS — M6281 Muscle weakness (generalized): Secondary | ICD-10-CM | POA: Diagnosis not present

## 2022-03-21 DIAGNOSIS — M6281 Muscle weakness (generalized): Secondary | ICD-10-CM | POA: Diagnosis not present

## 2022-03-24 DIAGNOSIS — M6281 Muscle weakness (generalized): Secondary | ICD-10-CM | POA: Diagnosis not present

## 2022-03-26 DIAGNOSIS — M6281 Muscle weakness (generalized): Secondary | ICD-10-CM | POA: Diagnosis not present

## 2022-03-27 DIAGNOSIS — M6281 Muscle weakness (generalized): Secondary | ICD-10-CM | POA: Diagnosis not present

## 2022-03-28 DIAGNOSIS — M6281 Muscle weakness (generalized): Secondary | ICD-10-CM | POA: Diagnosis not present

## 2022-03-30 DIAGNOSIS — M6281 Muscle weakness (generalized): Secondary | ICD-10-CM | POA: Diagnosis not present

## 2022-03-31 DIAGNOSIS — M6281 Muscle weakness (generalized): Secondary | ICD-10-CM | POA: Diagnosis not present

## 2022-04-03 DIAGNOSIS — M6281 Muscle weakness (generalized): Secondary | ICD-10-CM | POA: Diagnosis not present

## 2022-04-03 DIAGNOSIS — Z23 Encounter for immunization: Secondary | ICD-10-CM | POA: Diagnosis not present

## 2022-04-04 DIAGNOSIS — M6281 Muscle weakness (generalized): Secondary | ICD-10-CM | POA: Diagnosis not present

## 2022-04-05 DIAGNOSIS — M6281 Muscle weakness (generalized): Secondary | ICD-10-CM | POA: Diagnosis not present

## 2022-04-06 DIAGNOSIS — M6281 Muscle weakness (generalized): Secondary | ICD-10-CM | POA: Diagnosis not present

## 2022-04-09 ENCOUNTER — Other Ambulatory Visit: Payer: Self-pay

## 2022-04-09 ENCOUNTER — Observation Stay: Payer: Medicare HMO

## 2022-04-09 ENCOUNTER — Observation Stay
Admission: EM | Admit: 2022-04-09 | Discharge: 2022-04-10 | Disposition: A | Discharge status: Skilled Nursing Facility | Payer: Medicare HMO | Attending: Hospitalist | Admitting: Hospitalist

## 2022-04-09 ENCOUNTER — Encounter: Payer: Self-pay | Admitting: Emergency Medicine

## 2022-04-09 ENCOUNTER — Emergency Department: Payer: Medicare HMO

## 2022-04-09 DIAGNOSIS — F0390 Unspecified dementia without behavioral disturbance: Secondary | ICD-10-CM | POA: Diagnosis present

## 2022-04-09 DIAGNOSIS — R4182 Altered mental status, unspecified: Principal | ICD-10-CM | POA: Insufficient documentation

## 2022-04-09 DIAGNOSIS — F02818 Dementia in other diseases classified elsewhere, unspecified severity, with other behavioral disturbance: Secondary | ICD-10-CM | POA: Diagnosis not present

## 2022-04-09 DIAGNOSIS — Z79899 Other long term (current) drug therapy: Secondary | ICD-10-CM | POA: Diagnosis not present

## 2022-04-09 DIAGNOSIS — I10 Essential (primary) hypertension: Secondary | ICD-10-CM | POA: Diagnosis not present

## 2022-04-09 DIAGNOSIS — S0990XA Unspecified injury of head, initial encounter: Secondary | ICD-10-CM | POA: Diagnosis not present

## 2022-04-09 DIAGNOSIS — I6601 Occlusion and stenosis of right middle cerebral artery: Secondary | ICD-10-CM | POA: Diagnosis not present

## 2022-04-09 DIAGNOSIS — Z8546 Personal history of malignant neoplasm of prostate: Secondary | ICD-10-CM | POA: Diagnosis not present

## 2022-04-09 DIAGNOSIS — N400 Enlarged prostate without lower urinary tract symptoms: Secondary | ICD-10-CM | POA: Diagnosis not present

## 2022-04-09 DIAGNOSIS — Z1152 Encounter for screening for COVID-19: Secondary | ICD-10-CM | POA: Diagnosis not present

## 2022-04-09 DIAGNOSIS — R0902 Hypoxemia: Secondary | ICD-10-CM | POA: Diagnosis not present

## 2022-04-09 DIAGNOSIS — D509 Iron deficiency anemia, unspecified: Secondary | ICD-10-CM | POA: Insufficient documentation

## 2022-04-09 DIAGNOSIS — C61 Malignant neoplasm of prostate: Secondary | ICD-10-CM | POA: Diagnosis not present

## 2022-04-09 DIAGNOSIS — Z8551 Personal history of malignant neoplasm of bladder: Secondary | ICD-10-CM | POA: Insufficient documentation

## 2022-04-09 DIAGNOSIS — R0602 Shortness of breath: Secondary | ICD-10-CM | POA: Diagnosis not present

## 2022-04-09 DIAGNOSIS — N200 Calculus of kidney: Secondary | ICD-10-CM | POA: Diagnosis not present

## 2022-04-09 DIAGNOSIS — G319 Degenerative disease of nervous system, unspecified: Secondary | ICD-10-CM | POA: Diagnosis not present

## 2022-04-09 DIAGNOSIS — N3289 Other specified disorders of bladder: Secondary | ICD-10-CM | POA: Diagnosis present

## 2022-04-09 DIAGNOSIS — I499 Cardiac arrhythmia, unspecified: Secondary | ICD-10-CM | POA: Diagnosis not present

## 2022-04-09 DIAGNOSIS — G309 Alzheimer's disease, unspecified: Secondary | ICD-10-CM | POA: Diagnosis not present

## 2022-04-09 DIAGNOSIS — I6502 Occlusion and stenosis of left vertebral artery: Secondary | ICD-10-CM | POA: Diagnosis not present

## 2022-04-09 DIAGNOSIS — F02C0 Dementia in other diseases classified elsewhere, severe, without behavioral disturbance, psychotic disturbance, mood disturbance, and anxiety: Secondary | ICD-10-CM | POA: Insufficient documentation

## 2022-04-09 DIAGNOSIS — R55 Syncope and collapse: Secondary | ICD-10-CM | POA: Diagnosis not present

## 2022-04-09 DIAGNOSIS — R404 Transient alteration of awareness: Secondary | ICD-10-CM | POA: Diagnosis not present

## 2022-04-09 LAB — CBC WITH DIFFERENTIAL/PLATELET
Abs Immature Granulocytes: 0.01 10*3/uL (ref 0.00–0.07)
Basophils Absolute: 0 10*3/uL (ref 0.0–0.1)
Basophils Relative: 1 %
Eosinophils Absolute: 0 10*3/uL (ref 0.0–0.5)
Eosinophils Relative: 0 %
HCT: 38.4 % — ABNORMAL LOW (ref 39.0–52.0)
Hemoglobin: 12 g/dL — ABNORMAL LOW (ref 13.0–17.0)
Immature Granulocytes: 0 %
Lymphocytes Relative: 21 %
Lymphs Abs: 0.9 10*3/uL (ref 0.7–4.0)
MCH: 24.6 pg — ABNORMAL LOW (ref 26.0–34.0)
MCHC: 31.3 g/dL (ref 30.0–36.0)
MCV: 78.7 fL — ABNORMAL LOW (ref 80.0–100.0)
Monocytes Absolute: 0.3 10*3/uL (ref 0.1–1.0)
Monocytes Relative: 7 %
Neutro Abs: 3.2 10*3/uL (ref 1.7–7.7)
Neutrophils Relative %: 71 %
Platelets: 158 10*3/uL (ref 150–400)
RBC: 4.88 MIL/uL (ref 4.22–5.81)
RDW: 19.4 % — ABNORMAL HIGH (ref 11.5–15.5)
WBC: 4.4 10*3/uL (ref 4.0–10.5)
nRBC: 0 % (ref 0.0–0.2)

## 2022-04-09 LAB — BLOOD GAS, VENOUS
Acid-Base Excess: 4 mmol/L — ABNORMAL HIGH (ref 0.0–2.0)
Bicarbonate: 31.6 mmol/L — ABNORMAL HIGH (ref 20.0–28.0)
O2 Saturation: 17.8 %
Patient temperature: 37
pCO2, Ven: 60 mmHg (ref 44–60)
pH, Ven: 7.33 (ref 7.25–7.43)
pO2, Ven: <31 mmHg — CL (ref 32–45)

## 2022-04-09 LAB — URINE DRUG SCREEN, QUALITATIVE (ARMC ONLY)
Amphetamines, Ur Screen: NOT DETECTED
Barbiturates, Ur Screen: NOT DETECTED
Benzodiazepine, Ur Scrn: NOT DETECTED
Cannabinoid 50 Ng, Ur ~~LOC~~: NOT DETECTED
Cocaine Metabolite,Ur ~~LOC~~: NOT DETECTED
MDMA (Ecstasy)Ur Screen: NOT DETECTED
Methadone Scn, Ur: NOT DETECTED
Opiate, Ur Screen: NOT DETECTED
Phencyclidine (PCP) Ur S: NOT DETECTED
Tricyclic, Ur Screen: NOT DETECTED

## 2022-04-09 LAB — URINALYSIS, ROUTINE W REFLEX MICROSCOPIC
Bilirubin Urine: NEGATIVE
Glucose, UA: NEGATIVE mg/dL
Hgb urine dipstick: NEGATIVE
Ketones, ur: NEGATIVE mg/dL
Leukocytes,Ua: NEGATIVE
Nitrite: NEGATIVE
Protein, ur: NEGATIVE mg/dL
Specific Gravity, Urine: 1.017 (ref 1.005–1.030)
pH: 6 (ref 5.0–8.0)

## 2022-04-09 LAB — T4, FREE: Free T4: 0.82 ng/dL (ref 0.61–1.12)

## 2022-04-09 LAB — COMPREHENSIVE METABOLIC PANEL
ALT: 17 U/L (ref 0–44)
AST: 27 U/L (ref 15–41)
Albumin: 3.8 g/dL (ref 3.5–5.0)
Alkaline Phosphatase: 69 U/L (ref 38–126)
Anion gap: 12 (ref 5–15)
BUN: 8 mg/dL (ref 8–23)
CO2: 28 mmol/L (ref 22–32)
Calcium: 9.5 mg/dL (ref 8.9–10.3)
Chloride: 100 mmol/L (ref 98–111)
Creatinine, Ser: 1.06 mg/dL (ref 0.61–1.24)
GFR, Estimated: >60 mL/min (ref >60)
Glucose, Bld: 133 mg/dL — ABNORMAL HIGH (ref 70–99)
Potassium: 3.6 mmol/L (ref 3.5–5.1)
Sodium: 140 mmol/L (ref 135–145)
Total Bilirubin: 0.5 mg/dL (ref 0.3–1.2)
Total Protein: 7.8 g/dL (ref 6.5–8.1)

## 2022-04-09 LAB — PROTIME-INR
INR: 1.2 (ref 0.8–1.2)
Prothrombin Time: 15.1 seconds (ref 11.4–15.2)

## 2022-04-09 LAB — LACTIC ACID, PLASMA
Lactic Acid, Venous: 4.3 mmol/L (ref 0.5–1.9)
Lactic Acid, Venous: 2 mmol/L (ref 0.5–1.9)

## 2022-04-09 LAB — CBG MONITORING, ED: Glucose-Capillary: 118 mg/dL — ABNORMAL HIGH (ref 70–99)

## 2022-04-09 LAB — TYPE AND SCREEN

## 2022-04-09 LAB — TSH: TSH: 1.092 u[IU]/mL (ref 0.350–4.500)

## 2022-04-09 LAB — RESP PANEL BY RT-PCR (FLU A&B, COVID) ARPGX2
Influenza A by PCR: NEGATIVE
Influenza B by PCR: NEGATIVE
SARS Coronavirus 2 by RT PCR: NEGATIVE

## 2022-04-09 LAB — MAGNESIUM: Magnesium: 2.2 mg/dL (ref 1.7–2.4)

## 2022-04-09 LAB — AMMONIA: Ammonia: 18 umol/L (ref 9–35)

## 2022-04-09 LAB — TROPONIN I (HIGH SENSITIVITY)
Troponin I (High Sensitivity): 8 ng/L (ref <18)
Troponin I (High Sensitivity): 7 ng/L (ref <18)

## 2022-04-09 LAB — PROCALCITONIN: Procalcitonin: <0.1 ng/mL

## 2022-04-09 LAB — ETHANOL: Alcohol, Ethyl (B): <10 mg/dL (ref <10)

## 2022-04-09 MED ORDER — ONDANSETRON HCL 4 MG PO TABS: 4.0000 mg | ORAL_TABLET | Freq: Four times a day (QID) | ORAL | Status: DC | PRN

## 2022-04-09 MED ORDER — FINASTERIDE 5 MG PO TABS
5.0000 mg | ORAL_TABLET | Freq: Every day | ORAL | Status: DC
  Administered 2022-04-09 – 2022-04-10 (×2): 5 mg via ORAL
  Filled 2022-04-09 (×2): qty 1

## 2022-04-09 MED ORDER — VITAMIN B-12 1000 MCG PO TABS
1000.0000 ug | ORAL_TABLET | Freq: Every day | ORAL | Status: DC
  Administered 2022-04-09 – 2022-04-10 (×2): 1000 ug via ORAL
  Filled 2022-04-09: qty 2
  Filled 2022-04-09: qty 1

## 2022-04-09 MED ORDER — DIVALPROEX SODIUM 250 MG PO DR TAB: 250.0000 mg | DELAYED_RELEASE_TABLET | Freq: Every day | ORAL | Status: DC

## 2022-04-09 MED ORDER — DIVALPROEX SODIUM 125 MG PO CSDR
125.0000 mg | DELAYED_RELEASE_CAPSULE | Freq: Two times a day (BID) | ORAL | Status: DC
  Administered 2022-04-09 – 2022-04-10 (×2): 125 mg via ORAL
  Filled 2022-04-09 (×2): qty 1

## 2022-04-09 MED ORDER — GADOBUTROL 1 MMOL/ML IV SOLN
6.0000 mL | Freq: Once | INTRAVENOUS | Status: AC | PRN
  Administered 2022-04-09: 6 mL via INTRAVENOUS

## 2022-04-09 MED ORDER — MEMANTINE HCL 5 MG PO TABS
5.0000 mg | ORAL_TABLET | Freq: Two times a day (BID) | ORAL | Status: DC
  Administered 2022-04-09 – 2022-04-10 (×2): 5 mg via ORAL
  Filled 2022-04-09 (×2): qty 1

## 2022-04-09 MED ORDER — SODIUM CHLORIDE 0.9 % IV BOLUS
1000.0000 mL | Freq: Once | INTRAVENOUS | Status: AC
  Administered 2022-04-09: 1000 mL via INTRAVENOUS

## 2022-04-09 MED ORDER — IOHEXOL 350 MG/ML SOLN
100.0000 mL | Freq: Once | INTRAVENOUS | Status: AC | PRN
  Administered 2022-04-09: 75 mL via INTRAVENOUS

## 2022-04-09 MED ORDER — ACETAMINOPHEN 650 MG RE SUPP: 650.0000 mg | Freq: Four times a day (QID) | RECTAL | Status: DC | PRN

## 2022-04-09 MED ORDER — TERAZOSIN HCL 1 MG PO CAPS
1.0000 mg | ORAL_CAPSULE | Freq: Every day | ORAL | Status: DC
  Administered 2022-04-09 – 2022-04-10 (×2): 1 mg via ORAL
  Filled 2022-04-09 (×2): qty 1

## 2022-04-09 MED ORDER — LORAZEPAM 2 MG/ML IJ SOLN: 1.0000 mg | INTRAMUSCULAR | Status: DC | PRN

## 2022-04-09 MED ORDER — ACETAMINOPHEN 325 MG PO TABS: 650.0000 mg | ORAL_TABLET | Freq: Four times a day (QID) | ORAL | Status: DC | PRN

## 2022-04-09 MED ORDER — POLYETHYLENE GLYCOL 3350 17 G PO PACK
17.0000 g | PACK | Freq: Two times a day (BID) | ORAL | Status: DC
  Administered 2022-04-09 – 2022-04-10 (×2): 17 g via ORAL
  Filled 2022-04-09 (×2): qty 1

## 2022-04-09 MED ORDER — HYDRALAZINE HCL 20 MG/ML IJ SOLN: 5.0000 mg | Freq: Three times a day (TID) | INTRAMUSCULAR | Status: DC | PRN

## 2022-04-09 MED ORDER — ONDANSETRON HCL 4 MG/2ML IJ SOLN: 4.0000 mg | Freq: Four times a day (QID) | INTRAMUSCULAR | Status: DC | PRN

## 2022-04-09 MED ORDER — ENOXAPARIN SODIUM 40 MG/0.4ML IJ SOSY
40.0000 mg | PREFILLED_SYRINGE | INTRAMUSCULAR | Status: DC
  Administered 2022-04-09: 40 mg via SUBCUTANEOUS
  Filled 2022-04-09: qty 0.4

## 2022-04-09 NOTE — ED Notes (Signed)
Report received. EMS IV patent, flushes, will not draw. Pt cleaned, pure wick placed, awake, NAD, calm, contracted. Xray at [facility]

## 2022-04-09 NOTE — Assessment & Plan Note (Signed)
-   Finasteride 5 mg daily resumed

## 2022-04-09 NOTE — ED Notes (Signed)
Pt not in room, pt in imaging CT/ MRI. Daughter at [facility]

## 2022-04-09 NOTE — Assessment & Plan Note (Addendum)
With reported abnormal jerking motion - TSH, ammonia, initial high sensitive troponin, UDS level were normal - Seconds high sensitive troponin and lactic acid are in process at the time of this dictation - Check procalcitonin - Check COVID/influenza A/influenza B PCR - Negative for signs of active infection at this time - Neurology has been consulted - MRI of the brain with and without contrast ordered to assess for metastasis given carcinoma history

## 2022-04-09 NOTE — Assessment & Plan Note (Signed)
-   Suspicious for neoplasm - Continue outpatient follow-up

## 2022-04-09 NOTE — H&P (Addendum)
History and Physical   Daniel Knapp LNL:[id number] DOB: [birth year] DOA: 04/09/2022  PCP: Derinda Late, MD  Outpatient Specialists: Dr. Manuella Ghazi, neurologist Patient coming from: [facility] via EMS  I have personally briefly reviewed patient's old medical records in [place].  Chief Concern: Altered mental status  HPI: Daniel Knapp is a 71 year old male with history of Alzheimer's dementia, prostate carcinoma, bladder carcinoma, iron deficiency anemia, hypertension, who presents emergency department from [facility] for chief concerns of altered mental status.  Sister, Daniel Knapp at bedside states that at present he appears to be at his baseline.  Daniel Knapp does not know the symptoms or concerned for why [facility] called EMS for altered mental status.  Initial vitals in the emergency department showed temperature of 97.9, respiration rate of 18, heart rate of 82, blood pressure 117/74, SPO2 of 100% on room air.  Serum sodium is 140, potassium 3.6, chloride 100, bicarb 28, BUN of 8, serum creatinine of 1.04, GFR greater than 60, nonfasting blood glucose 133, high sensitive troponin was 8, TSH, free T4, and ammonia were within normal limits.  WBC 4.5, hemoglobin 12, platelets of 158. VBG 7.33/60/less than 31  Lactic acid elevated at 4.3.  No witnessed seizure in the emergency department and EDP consulted neurology for further recommendations.  ED treatment sodium chloride 1 L bolus. ----------------------- At bedside patient blinks his eyes spontaneously and tracks me.  He was not able to tell me his name when I ask.  He did wiggle his toes minimally at my request.  Ultimately he was not able to verbally answer any of my questions.  Sister, Daniel Knapp, at bedside states that she does not know why the facility called EMS and what was the specific symptom for the altered mental status.  Daniel Knapp states that at bedside he appears to be at his baseline over the past weeks and  months.  Social history: Patient lives at [facility]  ROS: Unable to complete as patient is nonverbal  ED Course: Discussed with emergency medicine provider, patient requiring hospitalization for chief concerns of altered mental status.  Assessment/Plan  Principal Problem:   Altered mental status Active Problems:   Bladder mass   HTN (hypertension)   Dementia (HCC)   BPH (benign prostatic hyperplasia)   Prostate cancer (HCC)   Assessment and Plan:  * Altered mental status With reported abnormal jerking motion - TSH, ammonia, initial high sensitive troponin, UDS level were normal - Seconds high sensitive troponin and lactic acid are in process at the time of this dictation - Check procalcitonin - Check COVID/influenza A/influenza B PCR - Negative for signs of active infection at this time - Neurology has been consulted - MRI of the brain with and without contrast ordered to assess for metastasis given carcinoma history  Bladder mass - Suspicious for neoplasm - Continue outpatient follow-up  HTN (hypertension) - Resume terazosin 1 mg daily - Hydralazine 5 mg IV every 8 hours as needed for SBP greater than 180, 5 days ordered  Dementia (HCC) - Resumed memantine 5 mg p.o. twice daily  Prostate cancer ([place]) - Continue outpatient follow-up  BPH (benign prostatic hyperplasia) - Finasteride 5 mg daily resumed  Chart reviewed.   DVT prophylaxis: Enoxaparin Code Status: DNR Diet: N.p.o. 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-has seen Dr Delaine Lame   Prostate  cancer [facility]) 02/2016   Prostate enlargement    Urinary catheter insertion/adjustment/removal    Urinary retention    UTI (lower urinary tract infection)    UTI symptoms    Weight loss    Past Surgical History:  Procedure Laterality Date   ESOPHAGOGASTRODUODENOSCOPY N/A 11/21/2021   Procedure: ESOPHAGOGASTRODUODENOSCOPY (EGD);  Surgeon: Annamaria Helling, DO;  Location: [facility] ENDOSCOPY;  Service: Gastroenterology;  Laterality: N/A;   IR ANGIOGRAM SELECTIVE EACH ADDITIONAL VESSEL  11/25/2021   IR EMBO ART  VEN HEMORR LYMPH EXTRAV  INC GUIDE ROADMAPPING  11/25/2021   IR TRANSHEPATIC PORTOGRAM WO HEMO  11/25/2021   IR US GUIDE VASC ACCESS RIGHT  11/25/2021   TRANSURETHRAL RESECTION OF BLADDER TUMOR N/A 08/17/2015   Procedure: TRANSURETHRAL RESECTION OF BLADDER TUMOR (TURBT);  Surgeon: [name], MD;  Location: [facility] ORS;  Service: Urology;  Laterality: N/A;   TRANSURETHRAL RESECTION OF PROSTATE N/A 08/17/2015   Procedure: TRANSURETHRAL RESECTION OF THE PROSTATE (TURP);  Surgeon: [name], MD;  Location: [facility] ORS;  Service: Urology;  Laterality: N/A;   XI ROBOTIC ASSISTED VENTRAL HERNIA N/A 09/30/2020   Procedure: XI ROBOTIC ASSISTED VENTRAL HERNIA;  Surgeon: Herbert Pun, MD;  Location: [facility] ORS;  Service: General;  Laterality: N/A;   Social History:  reports that he has never smoked. He has never used smokeless tobacco. He reports that he does not currently use alcohol. He reports that he does not currently use drugs after having used the following drugs: Marijuana.  No Known Allergies Family History  Problem Relation Age of Onset   Heart disease Mother    Cancer Father    Knapp disease Neg Hx    Prostate cancer Neg Hx    Family history: Family history reviewed and not pertinent  Prior to Admission medications   Medication Sig Start Date End Date Taking? Authorizing Provider  cholecalciferol (VITAMIN D) 25 MCG (1000 UNIT) tablet Take 1,000 Units by mouth daily.    Yes [provider]  divalproex (DEPAKOTE SPRINKLE) 125 MG capsule Take 125 mg by mouth 2 (two) times daily. 04/05/22  Yes [provider]  divalproex (DEPAKOTE) 250 MG DR tablet Take by mouth daily. 01/19/22  Yes [provider]  donepezil (ARICEPT) 10 MG tablet Take 10 mg by mouth at bedtime. 07/09/20  Yes [provider]  finasteride (PROSCAR) 5 MG tablet Take 5 mg by mouth daily in the afternoon. Reported on 08/17/2015   Yes [provider]  memantine (NAMENDA) 5 MG tablet Take 5 mg by mouth 2 (two) times daily. 08/14/20  Yes [provider]  Multiple Vitamin (MULTIVITAMIN WITH MINERALS) TABS tablet Take 1 tablet by mouth daily in the afternoon.   Yes [provider]  omeprazole (PRILOSEC) 40 MG capsule Take 40 mg by mouth 2 (two) times daily. 04/05/22  Yes [provider]  pantoprazole (PROTONIX) 40 MG tablet Take 1 tablet (40 mg total) by mouth 2 (two) times daily before a meal. 11/27/21  Yes Thurnell Lose, MD  polyethylene glycol (MIRALAX / GLYCOLAX) 17 g packet Take 17 g by mouth 2 (two) times daily. Until stooling regularly/soft stool then can decrease to daily or every other day 01/18/22  Yes Emeterio Reeve, DO  terazosin (HYTRIN) 1 MG capsule Take 1 mg by mouth daily. 04/05/22  Yes [provider]  vitamin B-12 (CYANOCOBALAMIN) 1000 MCG tablet Take 1,000 mcg by mouth daily in the afternoon.   Yes [provider]  divalproex (DEPAKOTE ER) 250 MG 24 hr tablet Take 250 mg by mouth daily in the afternoon. 07/25/20   [provider]  tamsulosin (FLOMAX) 0.4 MG CAPS capsule Take 0.4 mg by mouth daily in the afternoon. Reported on 09/01/2015 Patient not taking: Reported on 04/09/2022    [provider]   Physical Exam: Vitals:   04/09/22 1400 04/09/22 1430 04/09/22 1441 04/09/22 1451  BP: 121/86 120/85    Pulse: 69 79  74  Resp:      Temp:   (S) 97.9 F (36.6 C)   TempSrc:   (S) Oral    SpO2: 100% 100%  100%  Weight:      Height:       Constitutional: appears age-appropriate, frail, NAD, calm, comfortable Eyes: PERRL, lids and conjunctivae normal ENMT: Mucous membranes are moist. Posterior pharynx clear of any exudate or lesions. Age-appropriate dentition. Hearing appropriate Neck: normal, supple, no masses, no thyromegaly Respiratory: clear to auscultation bilaterally, no wheezing, no crackles. Normal respiratory effort. No accessory muscle use.  Cardiovascular: Regular rate and rhythm, no murmurs / rubs / gallops. No extremity edema. 2+ pedal pulses. No carotid bruits.  Abdomen: no tenderness, no masses palpated, no hepatosplenomegaly. Bowel sounds positive.  Musculoskeletal: no clubbing / cyanosis. No joint deformity upper and lower extremities. Good ROM, no contractures, no atrophy. Normal muscle tone.  Skin: no rashes, lesions, ulcers. No induration Neurologic: Sensation intact. Strength 5/5 in all 4.  Psychiatric: Normal judgment and insight. Alert and oriented x 3. Normal mood.   EKG: independently reviewed, showing atrial fibrillation with rate of 141, QTc 553  Chest x-ray on Admission: I personally reviewed and I agree with radiologist reading as below.  CT Renal Stone Study  Result Date: 04/09/2022 CLINICAL DATA:  Seizures, unresponsive, ventricular tachycardia, flank pain question Knapp stone EXAM: CT ABDOMEN AND PELVIS WITHOUT CONTRAST TECHNIQUE: Multidetector CT imaging of the abdomen and pelvis was performed following the standard protocol without IV contrast. RADIATION DOSE REDUCTION: This exam was performed according to the departmental dose-optimization program which includes automated exposure control, adjustment of the mA and/or kV according to patient size and/or use of iterative reconstruction technique. COMPARISON:  12/11/2021 FINDINGS: Lower chest: Lung bases appear hyperinflated but clear. Trace pericardial fluid. Hepatobiliary: Metallic foreign  body RIGHT lobe liver. Gallbladder and liver otherwise normal appearance. Pancreas: Normal appearance Spleen: Normal appearance Adrenals/Urinary Tract: Streak artifacts from patient's arms traverse abdomen. Adrenal thickening without mass. Tiny BILATERAL nonobstructing renal calculi. No renal mass, hydronephrosis or hydroureter. Bladder decompressed limiting assessment but there appears to be a mass at the anterior RIGHT bladder at least 2.9 x 2.4 cm suboptimally delineated, better visualized and larger on previous exam. Suspected bladder calculi. Stomach/Bowel: Appendix not visualized. No gross abnormalities of stomach or bowel loops seen on exam limited by lack of IV and oral contrast. Vascular/Lymphatic: Mild atherosclerotic calcifications aorta and iliac arteries without aneurysm. Scattered normal sized retroperitoneal nodes. Aortocaval adenopathy seen on the previous exam less well demonstrated on current study, nodes ill-defined but grossly stable. Reproductive: Prior prostate surgery. Other: No free air or free fluid. Small umbilical hernia containing fat. Musculoskeletal: Thoracolumbar degenerative changes and scoliosis. Mild osseous demineralization. IMPRESSION: Tiny BILATERAL nonobstructing renal calculi. Suspected bladder calculi.  Bladder decompressed limiting assessment but there appears to be a mass at the anterior RIGHT bladder at least 2.9 x 2.4 cm suboptimally delineated, better visualized and larger on previous exam, highly suspicious for bladder neoplasm. Aortocaval adenopathy suboptimally delineated but grossly stable. Small umbilical hernia containing fat. No acute intra-abdominal or intrapelvic abnormalities. Aortic Atherosclerosis (ICD10-I70.0). Electronically Signed   By: Lavonia Dana M.D.   On: 04/09/2022 13:02   CT HEAD WO CONTRAST (5MM)  Result Date: 04/09/2022 CLINICAL DATA:  Minor head trauma EXAM: CT HEAD WITHOUT CONTRAST TECHNIQUE: Contiguous axial images were obtained from the base  of the skull through the vertex without intravenous contrast. RADIATION DOSE REDUCTION: This exam was performed according to the departmental dose-optimization program which includes automated exposure control, adjustment of the mA and/or kV according to patient size and/or use of iterative reconstruction technique. COMPARISON:  12/11/2021 FINDINGS: Brain: Generalized atrophy. Normal ventricular morphology. No midline shift or mass effect. Small vessel chronic ischemic changes of deep cerebral white matter. No intracranial hemorrhage, mass lesion, evidence of acute infarction, or extra-axial fluid collection. Vascular: Atherosclerotic calcifications of internal carotid and vertebral arteries at skull base Skull: Intact Sinuses/Orbits: Clear Other: N/A IMPRESSION: Atrophy with small vessel chronic ischemic changes of deep cerebral white matter. No acute intracranial abnormalities. Electronically Signed   By: Lavonia Dana M.D.   On: 04/09/2022 12:52   DG Chest Portable 1 View  Result Date: 04/09/2022 CLINICAL DATA:  Shortness of breath.  Altered mental status. EXAM: PORTABLE CHEST 1 VIEW COMPARISON:  12/11/2021 FINDINGS: The heart size and mediastinal contours are within normal limits. Both lungs are clear. The visualized skeletal structures are unremarkable. IMPRESSION: No active disease. Electronically Signed   By: Marlaine Hind M.D.   On: 04/09/2022 11:59    Labs on Admission: I have personally reviewed following labs  CBC: Recent Labs  Lab 04/09/22 1200  WBC 4.4  NEUTROABS 3.2  HGB 12.0*  HCT 38.4*  MCV 78.7*  PLT 101   Basic Metabolic Panel: Recent Labs  Lab 04/09/22 1200  NA 140  K 3.6  CL 100  CO2 28  GLUCOSE 133*  BUN 8  CREATININE 1.06  CALCIUM 9.5  MG 2.2   GFR: Estimated Creatinine Clearance: 61.9 mL/min (by C-G formula based on SCr of 1.06 mg/dL).  Liver Function Tests: Recent Labs  Lab 04/09/22 1200  AST 27  ALT 17  ALKPHOS 69  BILITOT 0.5  PROT 7.8  ALBUMIN 3.8    Recent Labs  Lab 04/09/22 1200  AMMONIA 18   Coagulation Profile: Recent Labs  Lab 04/09/22 1200  INR 1.2   CBG: Recent Labs  Lab 04/09/22 1329  GLUCAP 118*   Thyroid Function Tests: Recent Labs    04/09/22 1200  TSH 1.092  FREET4 0.82   Urine analysis:    Component Value Date/Time   COLORURINE AMBER (A) 12/14/2021 1812   APPEARANCEUR CLOUDY (A) 12/14/2021 1812   APPEARANCEUR Cloudy (A) 09/01/2015 1015   LABSPEC 1.021 12/14/2021 1812   LABSPEC 1.014 09/17/2014 2237   PHURINE 5.0 12/14/2021 1812   GLUCOSEU NEGATIVE 12/14/2021 1812   GLUCOSEU Negative 09/17/2014 2237   HGBUR MODERATE (A) 12/14/2021 1812   BILIRUBINUR NEGATIVE 12/14/2021 1812   BILIRUBINUR Negative 09/01/2015 1015   BILIRUBINUR Negative 09/17/2014 2237   KETONESUR NEGATIVE 12/14/2021 1812   PROTEINUR 100 (A) 12/14/2021 1812   NITRITE NEGATIVE 12/14/2021 1812   LEUKOCYTESUR SMALL (A) 12/14/2021 1812   LEUKOCYTESUR Negative 09/17/2014 2237   Dr. Tobie Poet  [facility]  If 7PM-7AM, please contact overnight-coverage provider If 7AM-7PM, please contact day coverage provider [email or web address]  04/09/2022, 3:36 PM

## 2022-04-09 NOTE — ED Triage Notes (Signed)
Pt to ED via [facility] from [facility] for Altered Mental Status, unknown last known well. Per EMS they were told that pt jerked and then went unresponsive. No known hx/o seizures. EMS reports that when they put pt on monitor he had one 4 run beat of non-sustained v tach with EMS. Pt initially responsive only to paniful stimuli.

## 2022-04-09 NOTE — ED Provider Notes (Signed)
[facility] Provider Note    Event Date/Time   First MD Initiated Contact with Patient 04/09/22 1130     (approximate)   History   Altered Mental Status   HPI  Daniel Knapp is a 71 y.o. male with history of prostate cancer, severe dementia who comes in from [facility] for altered mental status.  Unknown last known well.  Patient did have a jerking episode and then went unresponsive no known history of seizures.  Patient does have severe dementia but he is oriented x2 at this time.  EMS was concerned about a 4 beats of nonsustained V. tach.  Initially responsive to painful stimuli but now not really following commands but alert and oriented x2.  Patient was noted to be slightly hypotensive  Physical Exam   Triage Vital Signs: ED Triage Vitals  Enc Vitals Group     BP      Pulse      Resp      Temp      Temp src      SpO2      Weight      Height      Head Circumference      Peak Flow      Pain Score      Pain Loc      Pain Edu?      Excl. in [place]?     Most recent vital signs: Vitals:   04/09/22 1200 04/09/22 1230  BP: 117/75 115/75  Pulse:    Resp:    SpO2:       General: Awake, alert and oriented x2 CV:  Good peripheral perfusion.  Resp:  Normal effort.  Abd:  No distention.  Soft and nontender Other:  Patient is withdrawing to pain and spontaneously moving his arms but he does not following commands.   ED Results / Procedures / Treatments   Labs (all labs ordered are listed, but only abnormal results are displayed) Labs Reviewed  CULTURE, BLOOD (ROUTINE X 2)  CULTURE, BLOOD (ROUTINE X 2)  LACTIC ACID, PLASMA  LACTIC ACID, PLASMA  CBC WITH DIFFERENTIAL/PLATELET  URINALYSIS, ROUTINE W REFLEX MICROSCOPIC  URINE DRUG SCREEN, QUALITATIVE ([facility] ONLY)  ETHANOL  MAGNESIUM  TSH  T4, FREE  CBG MONITORING, ED  TYPE AND SCREEN  TROPONIN I (HIGH SENSITIVITY)     EKG  My interpretation of EKG:  With atrial  fibrillation rate of 141 without any ST elevation or T wave inversions, normal intervals  RADIOLOGY I have reviewed the xray personally and interpreted no evidence of any pneumonia  PROCEDURES:  Critical Care performed: No  .1-3 Lead EKG Interpretation  Performed by: Vanessa [place], MD Authorized by: Vanessa , MD     Interpretation: normal     ECG rate:  80   ECG rate assessment: normal     Rhythm: sinus rhythm     Ectopy: none     Conduction: normal      MEDICATIONS ORDERED IN ED: Medications  sodium chloride 0.9 % bolus 1,000 mL (has no administration in time range)     IMPRESSION / MDM / ASSESSMENT AND PLAN / ED COURSE  I reviewed the triage vital signs and the nursing notes.   Patient's presentation is most consistent with acute presentation with potential threat to life or bodily function.   Patient comes in altered unclear if possible seizure versus cardiac cause leading to syncope and seizure-like activity.  We will  keep on cardiac monitor and get CT imaging to evaluate for any cranial mass.  Unknown last known normal and nonfocal neurological exam to suggest LVO therefore stroke code was not called.    11:38 AM  I did call patient's POA his sister given patient does have DNR form but had been written on it she does report that there was a new DNR form and that he is still DNR and that if his heart was to stop they would not want chest compressions or intubation.  Some concern for possible A-fib but on the monitor patient is in normal sinus when I saw the patient.  His VBG is reassuring without any hypercapnia.  His glucose normal lactate was elevated which could be from seizure-like activity we will give some fluids troponin negative EtOH negative.  At this time unguinal hold off on any antibiotics given otherwise does not meet any sepsis criteria.  I am concerned about possible seizure versus cardiac event.  According to family he still not his baseline self and  they would want further work-up of this.  I did send a message to neurology to evaluate him.  He is squeezing with his bilateral hands.    The patient is on the cardiac monitor to evaluate for evidence of arrhythmia and/or significant heart rate changes.      FINAL CLINICAL IMPRESSION(S) / ED DIAGNOSES   Final diagnoses:  Altered mental status, unspecified altered mental status type     Rx / DC Orders   ED Discharge Orders     None        Note:  This document was prepared using Dragon voice recognition software and may include unintentional dictation errors.   Vanessa [place], MD 04/09/22 1426

## 2022-04-09 NOTE — ED Notes (Signed)
Admitting at BS

## 2022-04-09 NOTE — ED Notes (Signed)
Pt to CT, no changes.  

## 2022-04-09 NOTE — Assessment & Plan Note (Signed)
-   Resumed memantine 5 mg p.o. twice daily

## 2022-04-09 NOTE — Assessment & Plan Note (Signed)
-   Continue outpatient follow-up ?

## 2022-04-09 NOTE — Consult Note (Signed)
Neurology Consultation Reason for Consult: Episode of AMS Referring Physician: Renetta Chalk  CC: "I am not sure what they saw"  History is obtained from: Sister, chart  HPI: Daniel Knapp is a 71 y.o. male who was at his nursing facility today when he had an episode of shaking.  He then apparently was unresponsive.  He has severe dementia, only able to answer a word or two at a time.  His sister who is at bedside states that he is at baseline at this point.  His sister states that he occasionally will have jerking episodes.  I witnessed one of these, where he is defend his bilateral arms and cried out slightly, he was readily distractible from this position, and appeared behavioral in nature.  She is not sure if what they saw at the nursing home was this or something else.  At baseline he needs assistance with all of his ADLs.  He was feeding himself some of his fingers, but his sister states that when she has been there it seems like he is full care for this as well.  He is able to say few words at a time.  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 He has never used smokeless tobacco. He reports that he does not currently use alcohol. He reports that he does not currently use drugs after having used the following drugs: Marijuana.   Exam: Current vital signs: BP 110/78   Pulse 89   Temp  (S) 97.9 F (36.6 C) (Oral)   Resp 16   Ht '6\' 2"'$  (1.88 m)   Wt 68.5 kg   SpO2 100%   BMI 19.39 kg/m  Vital signs in last 24 hours: Temp:  [97.9 F (36.6 C)] 97.9 F (36.6 C) (10/30 1441) Pulse Rate:  [69-97] 89 (10/30 1800) Resp:  [16-19] 16 (10/30 1800) BP: (99-131)/(70-86) 110/78 (10/30 1800) SpO2:  [94 %-100 %] 100 % (10/30 1800) Weight:  [68.5 kg] 68.5 kg (10/30 1243)   Physical Exam   Neuro: Mental Status: Patient is awake, alert, he is able to tell me his name, but does not answer other questions.  He does count fingers, but does not cooperate fully with exam. Cranial Nerves: II: Visual Fields are full. Pupils are equal, round, and reactive to light.  III,IV, VI: He has a right gaze preference, sister states this is baseline for quite a long time. V: Facial sensation is symmetric to temperature VII: Facial movement is symmetric.  VIII: hearing is intact to voice X: Uvula elevates symmetrically XI: Shoulder shrug is symmetric. XII: tongue is midline without atrophy or fasciculations.  Motor: I question if he has a mild left hemiparesis, sister also states this  has been this way for a long time. Sensory: He response to noxious stimuli bilaterally Cerebellar: Does not perform     I have reviewed labs in epic and the results pertinent to this consultation are: UA is negative Sodium is 140  I have reviewed the images obtained: CT-negative  Impression: 71 year old male with end-stage dementia who presents with an episode concerning for seizure.  It is unclear to me if the episode I witnessed is same as what prompted them to call 911, but that did not appear to be a seizure.  I do think an EEG would be prudent, but if this is negative I would not start antiepileptics at this time.  Recommendations: 1) EEG 2) further recommendations to follow.   Roland Rack, MD [facility] [phone]  If 7pm- 7am, please page neurology on call as listed  in [place].

## 2022-04-09 NOTE — Hospital Course (Addendum)
Mr. Daniel Knapp is a 71 year old male with history of Alzheimer's dementia, prostate carcinoma, bladder carcinoma, iron deficiency anemia, hypertension, who presents emergency department from [facility] for chief concerns of altered mental status.  Sister, [name] at bedside states that at present he appears to be at his baseline.  [name] does not know the symptoms or concerned for why [name] called EMS for altered mental status.  Initial vitals in the emergency department showed temperature of 97.9, respiration rate of 18, heart rate of 82, blood pressure 117/74, SPO2 of 100% on room air.  Serum sodium is 140, potassium 3.6, chloride 100, bicarb 28, BUN of 8, serum creatinine of 1.04, GFR greater than 60, nonfasting blood glucose 133, high sensitive troponin was 8, TSH, free T4, and ammonia were within normal limits.  WBC 4.5, hemoglobin 12, platelets of 158. VBG 7.33/60/less than 31  Lactic acid elevated at 4.3.  No witnessed seizure in the emergency department and EDP consulted neurology for further recommendations.  ED treatment sodium chloride 1 L bolus.

## 2022-04-09 NOTE — Assessment & Plan Note (Signed)
-   Resume terazosin 1 mg daily - Hydralazine 5 mg IV every 8 hours as needed for SBP greater than 180, 5 days ordered

## 2022-04-10 DIAGNOSIS — R251 Tremor, unspecified: Secondary | ICD-10-CM | POA: Diagnosis not present

## 2022-04-10 DIAGNOSIS — R4182 Altered mental status, unspecified: Secondary | ICD-10-CM | POA: Diagnosis not present

## 2022-04-10 DIAGNOSIS — Z7401 Bed confinement status: Secondary | ICD-10-CM | POA: Diagnosis not present

## 2022-04-10 DIAGNOSIS — R404 Transient alteration of awareness: Secondary | ICD-10-CM | POA: Diagnosis not present

## 2022-04-10 LAB — BLOOD CULTURE ID PANEL (REFLEXED) - BCID2

## 2022-04-10 LAB — CBC
HCT: 32.5 % — ABNORMAL LOW (ref 39.0–52.0)
Hemoglobin: 10 g/dL — ABNORMAL LOW (ref 13.0–17.0)
MCH: 24.7 pg — ABNORMAL LOW (ref 26.0–34.0)
MCHC: 30.8 g/dL (ref 30.0–36.0)
MCV: 80.2 fL (ref 80.0–100.0)
Platelets: 122 10*3/uL — ABNORMAL LOW (ref 150–400)
RBC: 4.05 MIL/uL — ABNORMAL LOW (ref 4.22–5.81)
RDW: 19.2 % — ABNORMAL HIGH (ref 11.5–15.5)
WBC: 2.5 10*3/uL — ABNORMAL LOW (ref 4.0–10.5)
nRBC: 0 % (ref 0.0–0.2)

## 2022-04-10 LAB — BASIC METABOLIC PANEL
Anion gap: 7 (ref 5–15)
BUN: 8 mg/dL (ref 8–23)
CO2: 29 mmol/L (ref 22–32)
Calcium: 9.2 mg/dL (ref 8.9–10.3)
Chloride: 104 mmol/L (ref 98–111)
Creatinine, Ser: 0.75 mg/dL (ref 0.61–1.24)
GFR, Estimated: >60 mL/min (ref >60)
Glucose, Bld: 103 mg/dL — ABNORMAL HIGH (ref 70–99)
Potassium: 3.3 mmol/L — ABNORMAL LOW (ref 3.5–5.1)
Sodium: 140 mmol/L (ref 135–145)

## 2022-04-10 NOTE — NC FL2 (Signed)
[place] LEVEL OF CARE SCREENING TOOL     IDENTIFICATION  Patient Name: Daniel Knapp Birthdate: [birth year] Sex: male Admission Date (Current Location): 04/09/2022  [facility] and [place] Number:  Engineering geologist and Address:  [facility], [address] [place], [place] [address]      Provider Number: [id number]  Attending Physician Name and Address:  Enzo Bi, MD  Relative Name and Phone Number:  [name] [phone]    Current Level of Care: Hospital Recommended Level of Care: [place] (Long term care) Prior Approval Number:    Date Approved/Denied:   PASRR Number: [id number]  Discharge Plan: SNF    Current Diagnoses: Patient Active Problem List   Diagnosis Date Noted   Altered mental status 04/09/2022   Hematochezia 32/95/1884   Acute metabolic encephalopathy 16/60/6301   Bladder mass 12/11/2021   Hydronephrosis with urinary obstruction due to right ureteral calculus 12/11/2021   AKI (acute kidney injury) ([place]) 12/11/2021   Abnormal CT scan, gastrointestinal tract    Iron deficiency anemia 11/23/2021   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/20/21 11/23/2021   Cirrhosis ([place]) 11/23/2021   Upper GI bleed 11/23/2021   Symptomatic anemia 11/20/2021   HTN (hypertension)    Dementia (HCC)    Hepatitis    Hypokalemia    Fall    Prostate cancer ([place]) 01/23/2016   BPH (benign prostatic hyperplasia) 08/17/2015   Urinary retention 11/10/2014   BPH (benign prostatic hypertrophy) with urinary retention 11/10/2014    Orientation RESPIRATION BLADDER Height & Weight     Self  Normal External catheter Weight: 68.5 kg Height:  '6\' 2"'$  (188 cm)  BEHAVIORAL SYMPTOMS/MOOD NEUROLOGICAL BOWEL NUTRITION STATUS  Other (Comment) (n/a)   Continent Diet (DYS 3)  AMBULATORY STATUS COMMUNICATION OF NEEDS Skin     Verbally Normal                       Personal Care Assistance Level of Assistance  Bathing, Feeding,  Dressing Bathing Assistance: Limited assistance Feeding assistance: Limited assistance Dressing Assistance: Limited assistance     Functional Limitations Info  Sight, Hearing Sight Info: Impaired Hearing Info: Impaired      SPECIAL CARE FACTORS FREQUENCY                       Contractures Contractures Info: Not present    Additional Factors Info  Code Status, Allergies Code Status Info: DNR Allergies Info: No Known Allergies           Current Medications (04/10/2022):  This is the current hospital active medication list Current Facility-Administered Medications  Medication Dose Route Frequency Provider Last Rate Last Admin   acetaminophen (TYLENOL) tablet 650 mg  650 mg Oral Q6H PRN Cox, Amy N, DO       Or   acetaminophen (TYLENOL) suppository 650 mg  650 mg Rectal Q6H PRN Cox, Amy N, DO       cyanocobalamin (VITAMIN B12) tablet 1,000 mcg  1,000 mcg Oral Q1500 Cox, Amy N, DO   1,000 mcg at 04/09/22 1614   divalproex (DEPAKOTE SPRINKLE) capsule 125 mg  125 mg Oral BID Cox, Amy N, DO   125 mg at 04/09/22 2130   enoxaparin (LOVENOX) injection 40 mg  40 mg Subcutaneous Q24H Cox, Amy N, DO   40 mg at 04/09/22 2131   finasteride (PROSCAR) tablet 5 mg  5 mg Oral Q1500 Cox, Amy N, DO  5 mg at 04/09/22 1614   hydrALAZINE (APRESOLINE) injection 5 mg  5 mg Intravenous Q8H PRN Cox, Amy N, DO       LORazepam (ATIVAN) injection 1 mg  1 mg Intravenous PRN Cox, Amy N, DO       memantine (NAMENDA) tablet 5 mg  5 mg Oral BID Cox, Amy N, DO   5 mg at 04/09/22 2131   ondansetron (ZOFRAN) tablet 4 mg  4 mg Oral Q6H PRN Cox, Amy N, DO       Or   ondansetron (ZOFRAN) injection 4 mg  4 mg Intravenous Q6H PRN Cox, Amy N, DO       polyethylene glycol (MIRALAX / GLYCOLAX) packet 17 g  17 g Oral BID Cox, Amy N, DO   17 g at 04/09/22 2131   terazosin (HYTRIN) capsule 1 mg  1 mg Oral Daily Cox, Amy N, DO   1 mg at 04/09/22 2131     Discharge Medications: Please see discharge summary for a  list of discharge medications.  Relevant Imaging Results:  Relevant Lab Results:   Additional Information SSN [id number]  Laurena Slimmer, RN

## 2022-04-10 NOTE — Progress Notes (Signed)
Eeg done 

## 2022-04-10 NOTE — Progress Notes (Signed)
PHARMACY - PHYSICIAN COMMUNICATION CRITICAL VALUE ALERT - BLOOD CULTURE IDENTIFICATION (BCID)  Results for orders placed or performed during the hospital encounter of 04/09/22  Blood culture (routine x 2)     Status: None (Preliminary result)   Collection Time: 04/09/22 11:55 AM   Specimen: BLOOD  Result Value Ref Range Status   Specimen Description BLOOD LEFT ANTECUBITAL  Final   Special Requests   Final    BOTTLES DRAWN AEROBIC AND ANAEROBIC Blood Culture adequate volume   Culture   Final    NO GROWTH < 24 HOURS Performed at [facility], [address] [place], [place] [address]    Report Status PENDING  Incomplete  Blood culture (routine x 2)     Status: None (Preliminary result)   Collection Time: 04/09/22 12:00 PM   Specimen: BLOOD  Result Value Ref Range Status   Specimen Description BLOOD BLOOD LEFT ARM  Final   Special Requests   Final    BOTTLES DRAWN AEROBIC AND ANAEROBIC Blood Culture adequate volume   Culture  Setup Time   Final    GRAM POSITIVE COCCI AEROBIC BOTTLE ONLY Organism ID to follow CRITICAL RESULT CALLED TO, READ BACK BY AND VERIFIED WITHVioleta Gelinas PHARMD [id number] 04/10/22 HNM Performed at [facility], [address] [place], [place] [address]    Culture GRAM POSITIVE COCCI  Final   Report Status PENDING  Incomplete  Blood Culture ID Panel (Reflexed)     Status: Abnormal   Collection Time: 04/09/22 12:00 PM  Result Value Ref Range Status   Enterococcus faecalis NOT DETECTED NOT DETECTED Final   Enterococcus Faecium NOT DETECTED NOT DETECTED Final   Listeria monocytogenes NOT DETECTED NOT DETECTED Final   Staphylococcus species DETECTED (A) NOT DETECTED Final    Comment: CRITICAL RESULT CALLED TO, READ BACK BY AND VERIFIED WITH: Violeta Gelinas PHARMD [id number] 04/10/22 HNM    Staphylococcus aureus (BCID) NOT DETECTED NOT DETECTED Final   Staphylococcus epidermidis DETECTED (A) NOT DETECTED Final    Comment: Methicillin (oxacillin)  resistant coagulase negative staphylococcus. Possible blood culture contaminant (unless isolated from more than one blood culture draw or clinical case suggests pathogenicity). No antibiotic treatment is indicated for blood  culture contaminants. CRITICAL RESULT CALLED TO, READ BACK BY AND VERIFIED WITH: Violeta Gelinas PHARMD [id number] 04/10/22 HNM    Staphylococcus lugdunensis NOT DETECTED NOT DETECTED Final   Streptococcus species NOT DETECTED NOT DETECTED Final   Streptococcus agalactiae NOT DETECTED NOT DETECTED Final   Streptococcus pneumoniae NOT DETECTED NOT DETECTED Final   Streptococcus pyogenes NOT DETECTED NOT DETECTED Final   A.calcoaceticus-baumannii NOT DETECTED NOT DETECTED Final   Bacteroides fragilis NOT DETECTED NOT DETECTED Final   Enterobacterales NOT DETECTED NOT DETECTED Final   Enterobacter cloacae complex NOT DETECTED NOT DETECTED Final   Escherichia coli NOT DETECTED NOT DETECTED Final   Klebsiella aerogenes NOT DETECTED NOT DETECTED Final   Klebsiella oxytoca NOT DETECTED NOT DETECTED Final   Klebsiella pneumoniae NOT DETECTED NOT DETECTED Final   Proteus species NOT DETECTED NOT DETECTED Final   Salmonella species NOT DETECTED NOT DETECTED Final   Serratia marcescens NOT DETECTED NOT DETECTED Final   Haemophilus influenzae NOT DETECTED NOT DETECTED Final   Neisseria meningitidis NOT DETECTED NOT DETECTED Final   Pseudomonas aeruginosa NOT DETECTED NOT DETECTED Final   Stenotrophomonas maltophilia NOT DETECTED NOT DETECTED Final   Candida albicans NOT DETECTED NOT DETECTED Final   Candida auris NOT DETECTED NOT DETECTED Final  Candida glabrata NOT DETECTED NOT DETECTED Final   Candida krusei NOT DETECTED NOT DETECTED Final   Candida parapsilosis NOT DETECTED NOT DETECTED Final   Candida tropicalis NOT DETECTED NOT DETECTED Final   Cryptococcus neoformans/gattii NOT DETECTED NOT DETECTED Final   Methicillin resistance mecA/C DETECTED (A) NOT DETECTED Final     Comment: CRITICAL RESULT CALLED TO, READ BACK BY AND VERIFIED WITHVioleta Gelinas [facility] [id number] 04/10/22 HNM Performed at [facility], [address] [place], [place] [address]   Resp Panel by RT-PCR (Flu A&B, Covid)     Status: None   Collection Time: 04/09/22  4:12 PM   Specimen: Nasal Swab  Result Value Ref Range Status   SARS Coronavirus 2 by RT PCR NEGATIVE NEGATIVE Final    Comment: (NOTE) SARS-CoV-2 target nucleic acids are NOT DETECTED.  The SARS-CoV-2 RNA is generally detectable in upper respiratory specimens during the acute phase of infection. The lowest concentration of SARS-CoV-2 viral copies this assay can detect is 138 copies/mL. A negative result does not preclude SARS-Cov-2 infection and should not be used as the sole basis for treatment or other patient management decisions. A negative result may occur with  improper specimen collection/handling, submission of specimen other than nasopharyngeal swab, presence of viral mutation(s) within the areas targeted by this assay, and inadequate number of viral copies(<138 copies/mL). A negative result must be combined with clinical observations, patient history, and epidemiological information. The expected result is Negative.  Fact Sheet for Patients:  EntrepreneurPulse.com.au  Fact Sheet for Healthcare Providers:  IncredibleEmployment.be  This test is no t yet approved or cleared by the [place] FDA and  has been authorized for detection and/or diagnosis of SARS-CoV-2 by FDA under an Emergency Use Authorization (EUA). This EUA will remain  in effect (meaning this test can be used) for the duration of the COVID-19 declaration under Section 564(b)(1) of the Act, 21 U.S.C.section 360bbb-3(b)(1), unless the authorization is terminated  or revoked sooner.       Influenza A by PCR NEGATIVE NEGATIVE Final   Influenza B by PCR NEGATIVE NEGATIVE Final    Comment:  (NOTE) The Xpert Xpress SARS-CoV-2/FLU/RSV plus assay is intended as an aid in the diagnosis of influenza from Nasopharyngeal swab specimens and should not be used as a sole basis for treatment. Nasal washings and aspirates are unacceptable for Xpert Xpress SARS-CoV-2/FLU/RSV testing.  Fact Sheet for Patients: EntrepreneurPulse.com.au  Fact Sheet for Healthcare Providers: IncredibleEmployment.be  This test is not yet approved or cleared by the [place] FDA and has been authorized for detection and/or diagnosis of SARS-CoV-2 by FDA under an Emergency Use Authorization (EUA). This EUA will remain in effect (meaning this test can be used) for the duration of the COVID-19 declaration under Section 564(b)(1) of the Act, 21 U.S.C. section 360bbb-3(b)(1), unless the authorization is terminated or revoked.  Performed at [facility], [address] [place], [place] [address]    BCID results:  1 (aerobic) of 4 w/ Staph Epi, mecA/C detected.   Pt not currently on any abx. Pt remains afebrile and WBC WNL.  Name of provider contacted: Morton Amy, NP   Changes to prescribed antibiotics required: Do not start Vancomycin at this time pending additional results.  Renda Rolls, PharmD, [facility] 04/10/2022 6:09 AM

## 2022-04-10 NOTE — Procedures (Addendum)
History: 71 yo with advanced dementia, question of seizure  Sedation: none  Technique: This EEG was acquired with electrodes placed according to the International 10-20 electrode system (including Fp1, Fp2, F3, F4, C3, C4, P3, P4, O1, O2, T3, T4, T5, T6, A1, A2, Fz, Cz, Pz). The following electrodes were missing or displaced: none.   Background: The background consists of generalized low voltage irregular delta range activity with a superimposed posterior dominant rhythm of 7 to 8 Hz.  Sleep is not recorded.  Photic stimulation: Physiologic driving is not performed  EEG Abnormalities: 1) generalized irregular slow activity 2) slow posterior dominant rhythm  Clinical Interpretation: This EEG is consistent with a nonspecific generalized cerebral dysfunction (encephalopathy). There was no seizure or seizure predisposition recorded on this study. Please note that lack of epileptiform activity on EEG does not preclude the possibility of epilepsy.   Roland Rack, MD [facility] [phone]  If 7pm- 7am, please page neurology on call as listed in [place].

## 2022-04-10 NOTE — TOC Transition Note (Signed)
Transition of Care [facility]) - CM/SW Discharge Note   Patient Details  Name: Daniel Knapp MRN: [id number] Date of Birth: 04/20/1951  [facility]) CM/SW Contact:  Laurena Slimmer, RN Phone Number: 04/10/2022, 4:07 PM   Clinical Narrative:     Discharge summary sent in [place].  Spoke with patient's sister [name] to advised of discharge back to [facility] EMS packet arranged. EMS scheduled for 5pm Nurse given assigned room number 212B and to call report to [phone]. TOC signing off.           Patient Goals and CMS Choice        Discharge Placement                       Discharge Plan and Services                                     Social Determinants of Health (SDOH) Interventions     Readmission Risk Interventions     No data to display

## 2022-04-10 NOTE — TOC Progression Note (Addendum)
Transition of Care [facility]) - Progression Note    Patient Details  Name: Daniel Knapp MRN: [id number] Date of Birth: May 10, 1951  Transition of Care [facility]) CM/SW Contact  Laurena Slimmer, RN Phone Number: 04/10/2022, 10:11 AM  Clinical Narrative:    MD notified RNCM of possible discharge today.  RNCM called [facility] and spoke with Neoma Laming to confirm patient's acceptance today.  Will update nurse with room number and where to call report when discharge summary received.   Attempt to contact patient's sister to advise of discharge today. No answer. Left a message.          Expected Discharge Plan and Services                                                 Social Determinants of Health (SDOH) Interventions    Readmission Risk Interventions     No data to display

## 2022-04-10 NOTE — Discharge Summary (Signed)
Physician Discharge Summary   Daniel Knapp  male DOB: 10-Jul-1950  EHU:[id number]  PCP: Derinda Late, MD  Admit date: 04/09/2022 Discharge date: April 15, 2022  Admitted From: SNF LTC Disposition:  SNF LTC CODE STATUS: DNR   Hospital Course:  For full details, please see H&P, progress notes, consult notes and ancillary notes.  Briefly,  Daniel Knapp is a 71 year old male with history of Alzheimer's dementia, prostate carcinoma, bladder carcinoma, iron deficiency anemia, hypertension, who presents emergency department from [facility] for an episode of shaking.  * Altered mental status, ruled out Seizure, ruled out His sister stated that pt was at baseline.  His sister stated that pt occasionally will have jerking episodes.  Neuro consulted, and Dr. Leonel Ramsay witnessed one of these, which appeared behavioral in nature. --spot EEG showed no seizure or seizure predisposition.  Pt was cleared for discharge by Neuro.   Bladder mass -This is not a new finding.  Bladder mass had shown up in multiple prior CT a/p. - Continue outpatient follow-up   HTN (hypertension) - cont terazosin 1 mg daily   Advanced Dementia (HCC) - cont memantine and Aricept   Prostate cancer ([place]) - Continue outpatient follow-up   BPH (benign prostatic hyperplasia) - cont Finasteride 5 mg daily  --pt was taking Flomax PTA   Discharge Diagnoses:  Principal Problem:   Altered mental status Active Problems:   Bladder mass   HTN (hypertension)   Dementia (HCC)   BPH (benign prostatic hyperplasia)   Prostate cancer [facility])     Discharge Instructions:  Allergies as of 04/15/2022   No Known Allergies      Medication List     STOP taking these medications    pantoprazole 40 MG tablet Commonly known as: Protonix   tamsulosin 0.4 MG Caps capsule Commonly known as: FLOMAX       TAKE these medications    cholecalciferol 25 MCG (1000 UNIT) tablet Commonly known as:  VITAMIN D3 Take 1,000 Units by mouth daily.   cyanocobalamin 1000 MCG tablet Commonly known as: VITAMIN B12 Take 1,000 mcg by mouth daily in the afternoon.   divalproex 125 MG capsule Commonly known as: DEPAKOTE SPRINKLE Take 125 mg by mouth 2 (two) times daily. What changed: Another medication with the same name was removed. Continue taking this medication, and follow the directions you see here.   donepezil 10 MG tablet Commonly known as: ARICEPT Take 10 mg by mouth at bedtime.   finasteride 5 MG tablet Commonly known as: PROSCAR Take 5 mg by mouth daily in the afternoon. Reported on 08/17/2015   memantine 5 MG tablet Commonly known as: NAMENDA Take 5 mg by mouth 2 (two) times daily.   multivitamin with minerals Tabs tablet Take 1 tablet by mouth daily in the afternoon.   omeprazole 40 MG capsule Commonly known as: PRILOSEC Take 40 mg by mouth 2 (two) times daily.   polyethylene glycol 17 g packet Commonly known as: MIRALAX / GLYCOLAX Take 17 g by mouth 2 (two) times daily. Until stooling regularly/soft stool then can decrease to daily or every other day   terazosin 1 MG capsule Commonly known as: HYTRIN Take 1 mg by mouth daily.          No Known Allergies   The results of significant diagnostics from this hospitalization (including imaging, microbiology, ancillary and laboratory) are listed below for reference.   Consultations:   Procedures/Studies: EEG adult  Result Date: Apr 15, 2022 Greta Doom, MD  04/10/2022  3:36 PM History: 71 yo with advanced dementia, question of seizure Sedation: none Technique: This EEG was acquired with electrodes placed according to the International 10-20 electrode system (including Fp1, Fp2, F3, F4, C3, C4, P3, P4, O1, O2, T3, T4, T5, T6, A1, A2, Fz, Cz, Pz). The following electrodes were missing or displaced: none. Background: The background consists of generalized low voltage irregular delta range activity with a  superimposed posterior dominant rhythm of 7 to 8 Hz.  Sleep is not recorded. Photic stimulation: Physiologic driving is not performed EEG Abnormalities: 1) generalized irregular slow activity 2) slow posterior dominant rhythm Clinical Interpretation: This EEG is consistent with a nonspecific generalized cerebral dysfunction (encephalopathy). There was no seizure or seizure predisposition recorded on this study. Please note that lack of epileptiform activity on EEG does not preclude the possibility of epilepsy. Roland Rack, MD [facility] [phone] If 7pm- 7am, please page neurology on call as listed in [place].   CT ANGIO HEAD NECK W WO CM  Result Date: 04/09/2022 CLINICAL DATA:  Stroke. Altered mental status. History of dementia. EXAM: CT ANGIOGRAPHY HEAD AND NECK TECHNIQUE: Multidetector CT imaging of the head and neck was performed using the standard protocol during bolus administration of intravenous contrast. Multiplanar CT image reconstructions and MIPs were obtained to evaluate the vascular anatomy. Carotid stenosis measurements (when applicable) are obtained utilizing NASCET criteria, using the distal internal carotid diameter as the denominator. RADIATION DOSE REDUCTION: This exam was performed according to the departmental dose-optimization program which includes automated exposure control, adjustment of the mA and/or kV according to patient size and/or use of iterative reconstruction technique. CONTRAST:  45m OMNIPAQUE IOHEXOL 350 MG/ML SOLN COMPARISON:  Head MRI 04/16/2018 FINDINGS: CTA NECK FINDINGS Aortic arch: Normal variant 4 vessel aortic arch with the left vertebral artery arising directly from the arch, distal to the left subclavian origin. Mild atherosclerosis without significant stenosis of the arch vessel origins. Right carotid system: Patent with a small amount of calcified plaque at the carotid bifurcation. No evidence of a significant stenosis or dissection. Left  carotid system: Patent with minimal calcified plaque at the carotid bifurcation. No evidence of a significant stenosis or dissection. Vertebral arteries: The right vertebral artery is patent and dominant without evidence of a significant stenosis or dissection. The left vertebral artery is diminutive in caliber and is patent proximally, however there is diminished opacification of the left V3 segment which appears occluded distally. Skeleton: Mild-to-moderate cervical spondylosis. Advanced right-sided facet arthrosis at C2-3 and C3-4. Other neck: Punctate bilateral parotid calcifications. No evidence of cervical lymphadenopathy or mass. Upper chest: Clear lung apices. Review of the MIP images confirms the above findings CTA HEAD FINDINGS Anterior circulation: The internal carotid arteries are patent from skull base to carotid termini with mild atherosclerotic calcification bilaterally not resulting in significant stenosis. ACAs and MCAs are patent without evidence of a significant A1 or M1 stenosis. There is a moderate to severe stenosis of the proximal right M2 superior division. No aneurysm is identified. Posterior circulation: The intracranial right vertebral artery is patent with calcified plaque not resulting in significant stenosis. 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 Patent right PICA, left AICA, and bilateral SCA origins are visualized. The basilar artery is widely patent. There are right larger than left posterior communicating arteries. The distal right P1 segment is poorly visualized which may reflect a severe stenosis or short segment occlusion, with the right PCA being patent more distally  the left PCA is patent without evidence of a significant proximal stenosis. No aneurysm is identified. Venous sinuses: Not well evaluated due to contrast timing. Anatomic variants: None. Review of the MIP images confirms the above findings IMPRESSION: 1. 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 2. Severe stenosis or short segment occlusion of the distal right P1 segment with a robust right posterior communicating artery. 3. Moderate to severe proximal right M2 stenosis. 4. Mild cervical carotid atherosclerosis without significant stenosis. Electronically Signed   By: Logan Bores M.D.   On: 04/09/2022 17:18   MR BRAIN W WO CONTRAST  Result Date: 04/09/2022 CLINICAL DATA:  Altered mental status, history of prostate cancer. EXAM: MRI HEAD WITHOUT AND WITH CONTRAST TECHNIQUE: Multiplanar, multiecho pulse sequences of the brain and surrounding structures were obtained without and with intravenous contrast. CONTRAST:  40m GADAVIST GADOBUTROL 1 MMOL/ML IV SOLN COMPARISON:  Same-day CT/CTA head and neck, brain MRI 04/16/2018 FINDINGS: Brain: There is no acute intracranial hemorrhage, extra-axial fluid collection, or acute infarct. There is moderate global parenchymal volume loss with prominence of the ventricular system and extra-axial CSF spaces. There is no discernible lobar predominance. There is proportionate bilateral hippocampal atrophy. There is patchy FLAIR signal abnormality in the subcortical and periventricular white matter likely reflecting sequela of chronic small-vessel ischemic change. Susceptibility artifact consistent with chronic blood products in the left cerebellar hemisphere are unchanged compared to the prior MRI from 2019. There is no mass lesion. There is no abnormal enhancement. There is no mass effect or midline shift. Vascular: Normal flow voids. Skull and upper cervical spine: Normal marrow signal. Sinuses/Orbits: The paranasal sinuses are clear. The globes and orbits are unremarkable. Other: None. IMPRESSION: No acute intracranial pathology or evidence of intracranial metastatic disease. Electronically Signed   By: PValetta MoleM.D.   On: 04/09/2022 15:52   CT Renal Stone Study  Result Date: 04/09/2022 CLINICAL DATA:  Seizures,  unresponsive, ventricular tachycardia, flank pain question kidney stone EXAM: CT ABDOMEN AND PELVIS WITHOUT CONTRAST TECHNIQUE: Multidetector CT imaging of the abdomen and pelvis was performed following the standard protocol without IV contrast. RADIATION DOSE REDUCTION: This exam was performed according to the departmental dose-optimization program which includes automated exposure control, adjustment of the mA and/or kV according to patient size and/or use of iterative reconstruction technique. COMPARISON:  12/11/2021 FINDINGS: Lower chest: Lung bases appear hyperinflated but clear. Trace pericardial fluid. Hepatobiliary: Metallic foreign body RIGHT lobe liver. Gallbladder and liver otherwise normal appearance. Pancreas: Normal appearance Spleen: Normal appearance Adrenals/Urinary Tract: Streak artifacts from patient's arms traverse abdomen. Adrenal thickening without mass. Tiny BILATERAL nonobstructing renal calculi. No renal mass, hydronephrosis or hydroureter. Bladder decompressed limiting assessment but there appears to be a mass at the anterior RIGHT bladder at least 2.9 x 2.4 cm suboptimally delineated, better visualized and larger on previous exam. Suspected bladder calculi. Stomach/Bowel: Appendix not visualized. No gross abnormalities of stomach or bowel loops seen on exam limited by lack of IV and oral contrast. Vascular/Lymphatic: Mild atherosclerotic calcifications aorta and iliac arteries without aneurysm. Scattered normal sized retroperitoneal nodes. Aortocaval adenopathy seen on the previous exam less well demonstrated on current study, nodes ill-defined but grossly stable. Reproductive: Prior prostate surgery. Other: No free air or free fluid. Small umbilical hernia containing fat. Musculoskeletal: Thoracolumbar degenerative changes and scoliosis. Mild osseous demineralization. IMPRESSION: Tiny BILATERAL nonobstructing renal calculi. Suspected bladder calculi. Bladder decompressed limiting  assessment but there appears to be a mass at the anterior RIGHT bladder at least  2.9 x 2.4 cm suboptimally delineated, better visualized and larger on previous exam, highly suspicious for bladder neoplasm. Aortocaval adenopathy suboptimally delineated but grossly stable. Small umbilical hernia containing fat. No acute intra-abdominal or intrapelvic abnormalities. Aortic Atherosclerosis (ICD10-I70.0). Electronically Signed   By: Lavonia Dana M.D.   On: 04/09/2022 13:02   CT HEAD WO CONTRAST (5MM)  Result Date: 04/09/2022 CLINICAL DATA:  Minor head trauma EXAM: CT HEAD WITHOUT CONTRAST TECHNIQUE: Contiguous axial images were obtained from the base of the skull through the vertex without intravenous contrast. RADIATION DOSE REDUCTION: This exam was performed according to the departmental dose-optimization program which includes automated exposure control, adjustment of the mA and/or kV according to patient size and/or use of iterative reconstruction technique. COMPARISON:  12/11/2021 FINDINGS: Brain: Generalized atrophy. Normal ventricular morphology. No midline shift or mass effect. Small vessel chronic ischemic changes of deep cerebral white matter. No intracranial hemorrhage, mass lesion, evidence of acute infarction, or extra-axial fluid collection. Vascular: Atherosclerotic calcifications of internal carotid and vertebral arteries at skull base Skull: Intact Sinuses/Orbits: Clear Other: N/A IMPRESSION: Atrophy with small vessel chronic ischemic changes of deep cerebral white matter. No acute intracranial abnormalities. Electronically Signed   By: Lavonia Dana M.D.   On: 04/09/2022 12:52   DG Chest Portable 1 View  Result Date: 04/09/2022 CLINICAL DATA:  Shortness of breath.  Altered mental status. EXAM: PORTABLE CHEST 1 VIEW COMPARISON:  12/11/2021 FINDINGS: The heart size and mediastinal contours are within normal limits. Both lungs are clear. The visualized skeletal structures are unremarkable.  IMPRESSION: No active disease. Electronically Signed   By: Marlaine Hind M.D.   On: 04/09/2022 11:59      Labs: BNP (last 3 results) No results for input(s): "BNP" in the last 8760 hours. Basic Metabolic Panel: Recent Labs  Lab 04/09/22 1200 04/10/22 0522  NA 140 140  K 3.6 3.3*  CL 100 104  CO2 28 29  GLUCOSE 133* 103*  BUN 8 8  CREATININE 1.06 0.75  CALCIUM 9.5 9.2  MG 2.2  --    Liver Function Tests: Recent Labs  Lab 04/09/22 1200  AST 27  ALT 17  ALKPHOS 69  BILITOT 0.5  PROT 7.8  ALBUMIN 3.8   No results for input(s): "LIPASE", "AMYLASE" in the last 168 hours. Recent Labs  Lab 04/09/22 1200  [place] 18   CBC: Recent Labs  Lab 04/09/22 1200 04/10/22 0522  WBC 4.4 2.5*  NEUTROABS 3.2  --   HGB 12.0* 10.0*  HCT 38.4* 32.5*  MCV 78.7* 80.2  PLT 158 122*   Cardiac Enzymes: No results for input(s): "CKTOTAL", "CKMB", "CKMBINDEX", "TROPONINI" in the last 168 hours. BNP: Invalid input(s): "POCBNP" CBG: Recent Labs  Lab 04/09/22 1329  GLUCAP 118*   D-Dimer No results for input(s): "DDIMER" in the last 72 hours. Hgb A1c No results for input(s): "HGBA1C" in the last 72 hours. Lipid Profile No results for input(s): "CHOL", "HDL", "LDLCALC", "TRIG", "CHOLHDL", "LDLDIRECT" in the last 72 hours. Thyroid function studies Recent Labs    04/09/22 1200  TSH 1.092   Anemia work up No results for input(s): "VITAMINB12", "FOLATE", "FERRITIN", "TIBC", "IRON", "RETICCTPCT" in the last 72 hours. Urinalysis    Component Value Date/Time   COLORURINE YELLOW (A) 04/09/2022 1612   APPEARANCEUR CLEAR (A) 04/09/2022 1612   APPEARANCEUR Cloudy (A) 09/01/2015 1015   LABSPEC 1.017 04/09/2022 1612   LABSPEC 1.014 09/17/2014 2237   PHURINE 6.0 04/09/2022 1612   GLUCOSEU NEGATIVE 04/09/2022 1612  GLUCOSEU Negative 09/17/2014 2237   HGBUR NEGATIVE 04/09/2022 [place] 04/09/2022 1612   BILIRUBINUR Negative 09/01/2015 1015   BILIRUBINUR Negative  09/17/2014 2237   KETONESUR NEGATIVE 04/09/2022 1612   PROTEINUR NEGATIVE 04/09/2022 1612   NITRITE NEGATIVE 04/09/2022 1612   LEUKOCYTESUR NEGATIVE 04/09/2022 1612   LEUKOCYTESUR Negative 09/17/2014 2237   Sepsis Labs Recent Labs  Lab 04/09/22 1200 04/10/22 0522  WBC 4.4 2.5*   Microbiology Recent Results (from the past 240 hour(s))  Blood culture (routine x 2)     Status: None (Preliminary result)   Collection Time: 04/09/22 11:55 AM   Specimen: BLOOD  Result Value Ref Range Status   Specimen Description BLOOD LEFT ANTECUBITAL  Final   Special Requests   Final    BOTTLES DRAWN AEROBIC AND ANAEROBIC Blood Culture adequate volume   Culture   Final    NO GROWTH < 24 HOURS Performed at [name], [address] [place], [place] [address]    Report Status PENDING  Incomplete  Blood culture (routine x 2)     Status: None (Preliminary result)   Collection Time: 04/09/22 12:00 PM   Specimen: BLOOD  Result Value Ref Range Status   Specimen Description BLOOD BLOOD LEFT ARM  Final   Special Requests   Final    BOTTLES DRAWN AEROBIC AND ANAEROBIC Blood Culture adequate volume   Culture  Setup Time   Final    GRAM POSITIVE COCCI IN BOTH AEROBIC AND ANAEROBIC BOTTLES Organism ID to follow CRITICAL RESULT CALLED TO, READ BACK BY AND VERIFIED WITHVioleta Gelinas PHARMD [id number] 04/10/22 HNM Performed at [facility], [address] [place], [place] [address]    Culture GRAM POSITIVE COCCI  Final   Report Status PENDING  Incomplete  Blood Culture ID Panel (Reflexed)     Status: Abnormal   Collection Time: 04/09/22 12:00 PM  Result Value Ref Range Status   Enterococcus faecalis NOT DETECTED NOT DETECTED Final   Enterococcus Faecium NOT DETECTED NOT DETECTED Final   Listeria monocytogenes NOT DETECTED NOT DETECTED Final   Staphylococcus species DETECTED (A) NOT DETECTED Final    Comment: CRITICAL RESULT CALLED TO, READ BACK BY AND VERIFIED WITH: Violeta Gelinas  PHARMD [id number] 04/10/22 HNM    Staphylococcus aureus (BCID) NOT DETECTED NOT DETECTED Final   Staphylococcus epidermidis DETECTED (A) NOT DETECTED Final    Comment: Methicillin (oxacillin) resistant coagulase negative staphylococcus. Possible blood culture contaminant (unless isolated from more than one blood culture draw or clinical case suggests pathogenicity). No antibiotic treatment is indicated for blood  culture contaminants. CRITICAL RESULT CALLED TO, READ BACK BY AND VERIFIED WITH: Violeta Gelinas PHARMD [id number] 04/10/22 HNM    Staphylococcus lugdunensis NOT DETECTED NOT DETECTED Final   Streptococcus species NOT DETECTED NOT DETECTED Final   Streptococcus agalactiae NOT DETECTED NOT DETECTED Final   Streptococcus pneumoniae NOT DETECTED NOT DETECTED Final   Streptococcus pyogenes NOT DETECTED NOT DETECTED Final   A.calcoaceticus-baumannii NOT DETECTED NOT DETECTED Final   Bacteroides fragilis NOT DETECTED NOT DETECTED Final   Enterobacterales NOT DETECTED NOT DETECTED Final   Enterobacter cloacae complex NOT DETECTED NOT DETECTED Final   Escherichia coli NOT DETECTED NOT DETECTED Final   Klebsiella aerogenes NOT DETECTED NOT DETECTED Final   Klebsiella oxytoca NOT DETECTED NOT DETECTED Final   Klebsiella pneumoniae NOT DETECTED NOT DETECTED Final   Proteus species NOT DETECTED NOT DETECTED Final   Salmonella species NOT DETECTED NOT DETECTED Final  Serratia marcescens NOT DETECTED NOT DETECTED Final   Haemophilus influenzae NOT DETECTED NOT DETECTED Final   Neisseria meningitidis NOT DETECTED NOT DETECTED Final   Pseudomonas aeruginosa NOT DETECTED NOT DETECTED Final   Stenotrophomonas maltophilia NOT DETECTED NOT DETECTED Final   Candida albicans NOT DETECTED NOT DETECTED Final   Candida auris NOT DETECTED NOT DETECTED Final   Candida glabrata NOT DETECTED NOT DETECTED Final   Candida krusei NOT DETECTED NOT DETECTED Final   Candida parapsilosis NOT DETECTED NOT DETECTED Final    Candida tropicalis NOT DETECTED NOT DETECTED Final   Cryptococcus neoformans/gattii NOT DETECTED NOT DETECTED Final   Methicillin resistance mecA/C DETECTED (A) NOT DETECTED Final    Comment: CRITICAL RESULT CALLED TO, READ BACK BY AND VERIFIED WITHVioleta Gelinas [facility] [id number] 04/10/22 HNM Performed at [facility], [address], [place], [place] [address]   Resp Panel by RT-PCR (Flu A&B, Covid)     Status: None   Collection Time: 04/09/22  4:12 PM   Specimen: Nasal Swab  Result Value Ref Range Status   SARS Coronavirus 2 by RT PCR NEGATIVE NEGATIVE Final    Comment: (NOTE) SARS-CoV-2 target nucleic acids are NOT DETECTED.  The SARS-CoV-2 RNA is generally detectable in upper respiratory specimens during the acute phase of infection. The lowest concentration of SARS-CoV-2 viral copies this assay can detect is 138 copies/mL. A negative result does not preclude SARS-Cov-2 infection and should not be used as the sole basis for treatment or other patient management decisions. A negative result may occur with  improper specimen collection/handling, submission of specimen other than nasopharyngeal swab, presence of viral mutation(s) within the areas targeted by this assay, and inadequate number of viral copies(<138 copies/mL). A negative result must be combined with clinical observations, patient history, and epidemiological information. The expected result is Negative.  Fact Sheet for Patients:  EntrepreneurPulse.com.au  Fact Sheet for Healthcare Providers:  IncredibleEmployment.be  This test is no t yet approved or cleared by the [place] FDA and  has been authorized for detection and/or diagnosis of SARS-CoV-2 by FDA under an Emergency Use Authorization (EUA). This EUA will remain  in effect (meaning this test can be used) for the duration of the COVID-19 declaration under Section 564(b)(1) of the Act, 21 U.S.C.section  360bbb-3(b)(1), unless the authorization is terminated  or revoked sooner.       Influenza A by PCR NEGATIVE NEGATIVE Final   Influenza B by PCR NEGATIVE NEGATIVE Final    Comment: (NOTE) The Xpert Xpress SARS-CoV-2/FLU/RSV plus assay is intended as an aid in the diagnosis of influenza from Nasopharyngeal swab specimens and should not be used as a sole basis for treatment. Nasal washings and aspirates are unacceptable for Xpert Xpress SARS-CoV-2/FLU/RSV testing.  Fact Sheet for Patients: EntrepreneurPulse.com.au  Fact Sheet for Healthcare Providers: IncredibleEmployment.be  This test is not yet approved or cleared by the [place] FDA and has been authorized for detection and/or diagnosis of SARS-CoV-2 by FDA under an Emergency Use Authorization (EUA). This EUA will remain in effect (meaning this test can be used) for the duration of the COVID-19 declaration under Section 564(b)(1) of the Act, 21 U.S.C. section 360bbb-3(b)(1), unless the authorization is terminated or revoked.  Performed at [facility], [address] [place], [place] [address]      Total time spend on discharging this patient, including the last patient exam, discussing the hospital stay, instructions for ongoing care as it relates to all pertinent caregivers, as well  as preparing the medical discharge records, prescriptions, and/or referrals as applicable, is 35 minutes.    Enzo Bi, MD  [facility] 04/10/2022, 3:41 PM

## 2022-04-12 DIAGNOSIS — G309 Alzheimer's disease, unspecified: Secondary | ICD-10-CM | POA: Diagnosis not present

## 2022-04-12 LAB — CULTURE, BLOOD (ROUTINE X 2): Special Requests: ADEQUATE

## 2022-04-14 LAB — CULTURE, BLOOD (ROUTINE X 2)
Culture: NO GROWTH
Special Requests: ADEQUATE

## 2022-04-17 DIAGNOSIS — Z23 Encounter for immunization: Secondary | ICD-10-CM | POA: Diagnosis not present

## 2022-04-18 DIAGNOSIS — R451 Restlessness and agitation: Secondary | ICD-10-CM | POA: Diagnosis not present

## 2022-04-18 DIAGNOSIS — F02811 Dementia in other diseases classified elsewhere, unspecified severity, with agitation: Secondary | ICD-10-CM | POA: Diagnosis not present

## 2022-04-18 DIAGNOSIS — G301 Alzheimer's disease with late onset: Secondary | ICD-10-CM | POA: Diagnosis not present

## 2022-04-18 DIAGNOSIS — F0390 Unspecified dementia without behavioral disturbance: Secondary | ICD-10-CM | POA: Diagnosis not present

## 2022-04-18 DIAGNOSIS — G309 Alzheimer's disease, unspecified: Secondary | ICD-10-CM | POA: Diagnosis not present

## 2022-04-18 DIAGNOSIS — Z79899 Other long term (current) drug therapy: Secondary | ICD-10-CM | POA: Diagnosis not present

## 2022-04-19 LAB — TYPE AND SCREEN
ABO/RH(D): O POS
Antibody Screen: NEGATIVE

## 2022-04-26 DIAGNOSIS — G309 Alzheimer's disease, unspecified: Secondary | ICD-10-CM | POA: Diagnosis not present

## 2022-05-01 DIAGNOSIS — G309 Alzheimer's disease, unspecified: Secondary | ICD-10-CM | POA: Diagnosis not present

## 2022-05-01 DIAGNOSIS — I864 Gastric varices: Secondary | ICD-10-CM | POA: Diagnosis not present

## 2022-05-01 DIAGNOSIS — F0280 Dementia in other diseases classified elsewhere without behavioral disturbance: Secondary | ICD-10-CM | POA: Diagnosis not present

## 2022-05-01 DIAGNOSIS — I10 Essential (primary) hypertension: Secondary | ICD-10-CM | POA: Diagnosis not present

## 2022-05-01 DIAGNOSIS — D649 Anemia, unspecified: Secondary | ICD-10-CM | POA: Diagnosis not present

## 2022-05-01 DIAGNOSIS — N400 Enlarged prostate without lower urinary tract symptoms: Secondary | ICD-10-CM | POA: Diagnosis not present

## 2022-05-05 DIAGNOSIS — K922 Gastrointestinal hemorrhage, unspecified: Secondary | ICD-10-CM | POA: Diagnosis not present

## 2022-05-22 DIAGNOSIS — E559 Vitamin D deficiency, unspecified: Secondary | ICD-10-CM | POA: Diagnosis not present

## 2022-05-22 DIAGNOSIS — Z7289 Other problems related to lifestyle: Secondary | ICD-10-CM | POA: Diagnosis not present

## 2022-05-22 DIAGNOSIS — G309 Alzheimer's disease, unspecified: Secondary | ICD-10-CM | POA: Diagnosis not present

## 2022-05-28 DIAGNOSIS — I739 Peripheral vascular disease, unspecified: Secondary | ICD-10-CM | POA: Diagnosis not present

## 2022-05-28 DIAGNOSIS — L603 Nail dystrophy: Secondary | ICD-10-CM | POA: Diagnosis not present

## 2022-05-29 DIAGNOSIS — E559 Vitamin D deficiency, unspecified: Secondary | ICD-10-CM | POA: Diagnosis not present

## 2022-05-30 DIAGNOSIS — R4182 Altered mental status, unspecified: Secondary | ICD-10-CM | POA: Diagnosis not present

## 2022-05-31 ENCOUNTER — Emergency Department: Payer: Medicare HMO

## 2022-05-31 ENCOUNTER — Other Ambulatory Visit: Payer: Self-pay

## 2022-05-31 ENCOUNTER — Emergency Department
Admission: EM | Admit: 2022-05-31 | Discharge: 2022-05-31 | Disposition: A | Discharge status: Skilled Nursing Facility | Payer: Medicare HMO | Attending: Emergency Medicine | Admitting: Emergency Medicine

## 2022-05-31 ENCOUNTER — Encounter: Payer: Self-pay | Admitting: Emergency Medicine

## 2022-05-31 DIAGNOSIS — R531 Weakness: Secondary | ICD-10-CM | POA: Diagnosis not present

## 2022-05-31 DIAGNOSIS — I10 Essential (primary) hypertension: Secondary | ICD-10-CM | POA: Diagnosis not present

## 2022-05-31 DIAGNOSIS — G309 Alzheimer's disease, unspecified: Secondary | ICD-10-CM | POA: Insufficient documentation

## 2022-05-31 DIAGNOSIS — D61818 Other pancytopenia: Secondary | ICD-10-CM | POA: Diagnosis not present

## 2022-05-31 DIAGNOSIS — C61 Malignant neoplasm of prostate: Secondary | ICD-10-CM | POA: Diagnosis not present

## 2022-05-31 DIAGNOSIS — E440 Moderate protein-calorie malnutrition: Secondary | ICD-10-CM | POA: Diagnosis not present

## 2022-05-31 DIAGNOSIS — Z8546 Personal history of malignant neoplasm of prostate: Secondary | ICD-10-CM | POA: Diagnosis not present

## 2022-05-31 DIAGNOSIS — R569 Unspecified convulsions: Secondary | ICD-10-CM | POA: Diagnosis not present

## 2022-05-31 DIAGNOSIS — I959 Hypotension, unspecified: Secondary | ICD-10-CM | POA: Insufficient documentation

## 2022-05-31 DIAGNOSIS — Z743 Need for continuous supervision: Secondary | ICD-10-CM | POA: Diagnosis not present

## 2022-05-31 DIAGNOSIS — G301 Alzheimer's disease with late onset: Secondary | ICD-10-CM | POA: Diagnosis not present

## 2022-05-31 DIAGNOSIS — R000 Tachycardia, unspecified: Secondary | ICD-10-CM | POA: Diagnosis not present

## 2022-05-31 DIAGNOSIS — D649 Anemia, unspecified: Secondary | ICD-10-CM | POA: Diagnosis not present

## 2022-05-31 DIAGNOSIS — F0280 Dementia in other diseases classified elsewhere without behavioral disturbance: Secondary | ICD-10-CM | POA: Diagnosis not present

## 2022-05-31 DIAGNOSIS — R7989 Other specified abnormal findings of blood chemistry: Secondary | ICD-10-CM | POA: Diagnosis not present

## 2022-05-31 DIAGNOSIS — R634 Abnormal weight loss: Secondary | ICD-10-CM | POA: Diagnosis not present

## 2022-05-31 DIAGNOSIS — I6782 Cerebral ischemia: Secondary | ICD-10-CM | POA: Diagnosis not present

## 2022-05-31 LAB — CBC WITH DIFFERENTIAL/PLATELET
Abs Immature Granulocytes: 0.01 10*3/uL (ref 0.00–0.07)
Basophils Absolute: 0 10*3/uL (ref 0.0–0.1)
Basophils Relative: 1 %
Eosinophils Absolute: 0 10*3/uL (ref 0.0–0.5)
Eosinophils Relative: 1 %
HCT: 35.8 % — ABNORMAL LOW (ref 39.0–52.0)
Hemoglobin: 11 g/dL — ABNORMAL LOW (ref 13.0–17.0)
Immature Granulocytes: 0 %
Lymphocytes Relative: 25 %
Lymphs Abs: 1.1 10*3/uL (ref 0.7–4.0)
MCH: 26.5 pg (ref 26.0–34.0)
MCHC: 30.7 g/dL (ref 30.0–36.0)
MCV: 86.3 fL (ref 80.0–100.0)
Monocytes Absolute: 0.2 10*3/uL (ref 0.1–1.0)
Monocytes Relative: 5 %
Neutro Abs: 3.1 10*3/uL (ref 1.7–7.7)
Neutrophils Relative %: 68 %
Platelets: 207 10*3/uL (ref 150–400)
RBC: 4.15 MIL/uL — ABNORMAL LOW (ref 4.22–5.81)
RDW: 18 % — ABNORMAL HIGH (ref 11.5–15.5)
WBC: 4.5 10*3/uL (ref 4.0–10.5)
nRBC: 0 % (ref 0.0–0.2)

## 2022-05-31 LAB — TROPONIN I (HIGH SENSITIVITY)
Troponin I (High Sensitivity): 4 ng/L (ref <18)
Troponin I (High Sensitivity): 4 ng/L (ref <18)

## 2022-05-31 LAB — COMPREHENSIVE METABOLIC PANEL
ALT: 30 U/L (ref 0–44)
AST: 27 U/L (ref 15–41)
Albumin: 3.9 g/dL (ref 3.5–5.0)
Alkaline Phosphatase: 60 U/L (ref 38–126)
Anion gap: 13 (ref 5–15)
BUN: 18 mg/dL (ref 8–23)
CO2: 23 mmol/L (ref 22–32)
Calcium: 9.5 mg/dL (ref 8.9–10.3)
Chloride: 99 mmol/L (ref 98–111)
Creatinine, Ser: 1.53 mg/dL — ABNORMAL HIGH (ref 0.61–1.24)
GFR, Estimated: 48 mL/min — ABNORMAL LOW (ref >60)
Glucose, Bld: 133 mg/dL — ABNORMAL HIGH (ref 70–99)
Potassium: 4.1 mmol/L (ref 3.5–5.1)
Sodium: 135 mmol/L (ref 135–145)
Total Bilirubin: 0.8 mg/dL (ref 0.3–1.2)
Total Protein: 8.1 g/dL (ref 6.5–8.1)

## 2022-05-31 LAB — URINALYSIS, ROUTINE W REFLEX MICROSCOPIC
Bilirubin Urine: NEGATIVE
Glucose, UA: NEGATIVE mg/dL
Hgb urine dipstick: NEGATIVE
Ketones, ur: NEGATIVE mg/dL
Leukocytes,Ua: NEGATIVE
Nitrite: NEGATIVE
Protein, ur: 100 mg/dL — AB
Specific Gravity, Urine: 1.025 (ref 1.005–1.030)
pH: 5 (ref 5.0–8.0)

## 2022-05-31 MED ORDER — SODIUM CHLORIDE 0.9 % IV BOLUS
1000.0000 mL | Freq: Once | INTRAVENOUS | Status: AC
  Administered 2022-05-31: 1000 mL via INTRAVENOUS

## 2022-05-31 NOTE — ED Triage Notes (Signed)
Patient sent to ED by [facility] neurology for hypotension. Patient unable to give history due to having dementia.

## 2022-05-31 NOTE — ED Notes (Signed)
[facility] called for transport to [facility] 

## 2022-05-31 NOTE — Discharge Instructions (Signed)
-  Please ensure that the patient is eating/drinking appropriately throughout the day.  -Please have the patient follow-up with his neurologist, as discussed.  -Return the patient to the emergency department anytime if you begin to experience any new or worsening symptoms.

## 2022-05-31 NOTE — ED Provider Notes (Signed)
[facility] Provider Note    Event Date/Time   First MD Initiated Contact with Patient 05/31/22 1818     (approximate)   History   Chief Complaint Hypotension   HPI Daniel Knapp is a 71 y.o. male, history of severe Alzheimer's disease/vascular dementia, prostate cancer, hypertension, iron deficiency anemia, presents to the emergency department for evaluation of hypotension.  Patient was sent here from clinical clinic neurology for evaluation and treatment of reported "hemodynamic instability/hypotension".  Patient is not endorsing any active symptoms at this time.  He states that he is unsure why he is here.  There is no guarding or caretaker with him.  Spoke with his nurse at residential facility, [facility], who advised that he was sent to a neurology appointment this morning, but was unaware that he was here in the emergency department at this time.  They state that he was sent to neurology due to frequent syncopal episodes and seizure-like activity in which they described him having episodes of "eyes rolling back and clenching his hands for several minutes.".   Spoke with his sister, Daniel Knapp, who stated that she was also unaware that he was in the emergency department.  However, she states she is aware of his frequent syncopal episodes and seizure-like activity has been ongoing for the past few months..  He reportedly had an MRI on 04/09/2020 which was reviewed in office today at the neurology clinic.  Spoke with the on-call neurologist at [facility], Dr. Melrose Nakayama, who advised that he was unfamiliar with the patient, but confirmed that the neurologist on duty was concern for hemodynamic instability.   History Limitations: Dementia.        Physical Exam  Triage Vital Signs: ED Triage Vitals [05/31/22 1608]  Enc Vitals Group     BP (!) 105/93     Pulse Rate 87     Resp 18     Temp 97.8 F (36.6 C)     Temp Source Oral     SpO2 100 %      Weight      Height      Head Circumference      Peak Flow      Pain Score      Pain Loc      Pain Edu?      Excl. in [place]?     Most recent vital signs: Vitals:   05/31/22 1608 05/31/22 2028  BP: (!) 105/93 (!) 163/92  Pulse: 87 76  Resp: 18 20  Temp: 97.8 F (36.6 C) (!) 96.7 F (35.9 C)  SpO2: 100% 100%    General: Awake, NAD.  Skin: Warm, dry. No rashes or lesions.  Eyes: PERRL. Conjunctivae normal.  CV: Good peripheral perfusion.  Resp: Normal effort.  Abd: Soft, non-tender. No distention.  Neuro: At baseline. No gross neurological deficits.  Musculoskeletal: Normal ROM of all extremities.  Focused Exam: Patient is responsive to questions, however gives incoherent answers.  He does not appear to be any significant distress at this time.  No gross neurological deficits.  No facial droop.  5/5 strength and sensation in both upper and lower extremities.  Physical Exam    ED Results / Procedures / Treatments  Labs (all labs ordered are listed, but only abnormal results are displayed) Labs Reviewed  COMPREHENSIVE METABOLIC PANEL - Abnormal; Notable for the following components:      Result Value   Glucose, Bld 133 (*)    Creatinine, Ser  1.53 (*)    GFR, Estimated 48 (*)    All other components within normal limits  CBC WITH DIFFERENTIAL/PLATELET - Abnormal; Notable for the following components:   RBC 4.15 (*)    Hemoglobin 11.0 (*)    HCT 35.8 (*)    RDW 18.0 (*)    All other components within normal limits  URINALYSIS, ROUTINE W REFLEX MICROSCOPIC - Abnormal; Notable for the following components:   Color, Urine AMBER (*)    APPearance CLOUDY (*)    Protein, ur 100 (*)    Bacteria, UA RARE (*)    All other components within normal limits  TROPONIN I (HIGH SENSITIVITY)  TROPONIN I (HIGH SENSITIVITY)     EKG Sinus tachycardia, rate of 106, significant artifact present due to lack of compliance with the patient.  No obvious ST segment  changes.    RADIOLOGY  ED Provider Interpretation: I personally reviewed and interpreted these images, chest x-ray shows no acute findings.  CT head shows no evidence of acute intracranial abnormalities.  DG Chest 2 View  Result Date: 05/31/2022 CLINICAL DATA:  Weakness. EXAM: CHEST - 2 VIEW COMPARISON:  April 09, 2022. FINDINGS: The heart size and mediastinal contours are within normal limits. Both lungs are clear. The visualized skeletal structures are unremarkable. IMPRESSION: No active cardiopulmonary disease. Electronically Signed   By: Marijo Conception M.D.   On: 05/31/2022 16:14   CT Head Wo Contrast  Result Date: 05/31/2022 CLINICAL DATA:  Syncope/presyncope, cerebrovascular cause suspected. Seizure. EXAM: CT HEAD WITHOUT CONTRAST TECHNIQUE: Contiguous axial images were obtained from the base of the skull through the vertex without intravenous contrast. RADIATION DOSE REDUCTION: This exam was performed according to the departmental dose-optimization program which includes automated exposure control, adjustment of the mA and/or kV according to patient size and/or use of iterative reconstruction technique. COMPARISON:  Head CT and MRI 04/09/2022 FINDINGS: Brain: There is no evidence of an acute infarct, intracranial hemorrhage, mass, midline shift, or extra-axial fluid collection. Moderate cerebral atrophy is again noted. Hypodensities in the cerebral white matter are unchanged and nonspecific but compatible with mild chronic small vessel ischemic disease. Calcified atherosclerosis crash that Vascular: Calcified atherosclerosis at the skull base. No hyperdense vessel. Skull: No acute fracture or suspicious osseous lesion. Sinuses/Orbits: Mild left ethmoid air cell mucosal thickening. Visualized mastoid air cells are clear. Visualized portions of the orbits are unremarkable. Other: None. IMPRESSION: 1. No evidence of acute intracranial abnormality. 2. Mild chronic small vessel ischemic disease  and moderate cerebral atrophy. Electronically Signed   By: Logan Bores M.D.   On: 05/31/2022 16:08    PROCEDURES:  Critical Care performed: N/A.  Procedures    MEDICATIONS ORDERED IN ED: Medications  sodium chloride 0.9 % bolus 1,000 mL (1,000 mLs Intravenous New Bag/Given 05/31/22 1838)     IMPRESSION / MDM / ASSESSMENT AND PLAN / ED COURSE  I reviewed the triage vital signs and the nursing notes.                              Differential diagnosis includes, but is not limited to, hypertension, ACS, dehydration, AKI, cystitis, seizures, vascular dementia, Alzheimer's, brain metastases.  ED Course Patient appears well, blood pressure is slightly low at 105/93, otherwise normal vitals.  Will treat initially with IV fluids.  CBC shows no leukocytosis.  Mild anemia present 11.0 hemoglobin.  Consistent with previous values.  CMP shows elevated creatinine at 1.53,  no transaminitis or significant electrolyte abnormalities.  Initial troponin 4, consistent with previous values.  Urinalysis shows no evidence of infection.  Assessment/Plan Patient presents to the emergency department after being sent by critical clinic neurology for concern of "hypotension/hemodynamic instability".  Neurology note from clinic this morning did not specify any significant issues.  Blood pressure marked there was 108/76.  Recent MRI brain on 04/10/2022 was reviewed by them, and they recommended stopping the Namenda and donepezil.  Advised to continue Depakote.  Here in the emergency department, the patient does not appear to be experiencing any symptoms.  He is hemodynamically stable.  No significant hypotension noted here.  Provide him with IV fluids.  Lab workup is unremarkable, urinalysis shows no evidence of infection.  CT head did not show any acute findings.  Low suspicion for any serious or life-threatening pathology at this time.  He is joined now by his sister, who advised that he is currently at  baseline and does not see need for him to be here at this time.  Spoke with Dr. Britt Bottom clinic neurology, who advised he would follow-up with the patient soon.  I believe patient is safe for discharge back to his residence.  His sister agreed.  Considered admission for this patient, but given his stable presentation and unremarkable workup, he is unlikely benefit from admission.  Provided the patient with anticipatory guidance, return precautions, and educational material. Encouraged the patient to return to the emergency department at any time if they begin to experience any new or worsening symptoms. Patient expressed understanding and agreed with the plan.   Patient's presentation is most consistent with acute complicated illness / injury requiring diagnostic workup.       FINAL CLINICAL IMPRESSION(S) / ED DIAGNOSES   Final diagnoses:  Hypotension, unspecified hypotension type     Rx / DC Orders   ED Discharge Orders     None        Note:  This document was prepared using Dragon voice recognition software and may include unintentional dictation errors.   Teodoro Spray, [place] 05/31/22 2043    Arta Silence, MD 05/31/22 [phone]

## 2022-05-31 NOTE — ED Provider Triage Note (Signed)
  Emergency Medicine Provider Triage Evaluation Note  Daniel Knapp , a 71 y.o.male,  was evaluated in triage.  Pt sent here by his neurology clinic for concerns of "hemodynamic instability/hypotension".  Patient has reportedly been having more syncopal episodes recently.  Experiencing any pain at this time.  Currently symptomatic.  Patient has history of vascular dementia/Alzheimer's.   Review of Systems  Positive: Hypertension, syncope Negative: Denies fever, chest pain, vomiting  Physical Exam  There were no vitals filed for this visit. Gen:   Awake, no distress   Resp:  Normal effort  MSK:   Moves extremities without difficulty  Other:    Medical Decision Making  Given the patient's initial medical screening exam, the following diagnostic evaluation has been ordered. The patient will be placed in the appropriate treatment space, once one is available, to complete the evaluation and treatment. I have discussed the plan of care with the patient and I have advised the patient that an ED physician or mid-level practitioner will reevaluate their condition after the test results have been received, as the results may give them additional insight into the type of treatment they may need.    Diagnostics: Labs, UA, CXR, head CT, EKG  Treatments: none immediately   Teodoro Spray, [place] 05/31/22 1536

## 2022-06-14 ENCOUNTER — Inpatient Hospital Stay: Payer: Medicare Other

## 2022-06-14 ENCOUNTER — Encounter: Payer: Self-pay | Admitting: Internal Medicine

## 2022-06-14 ENCOUNTER — Inpatient Hospital Stay: Payer: Medicare Other | Attending: Internal Medicine | Admitting: Internal Medicine

## 2022-06-14 VITALS — BP 131/82 | HR 59 | Temp 96.9°F | Resp 20

## 2022-06-14 DIAGNOSIS — N1330 Unspecified hydronephrosis: Secondary | ICD-10-CM | POA: Diagnosis not present

## 2022-06-14 DIAGNOSIS — N3289 Other specified disorders of bladder: Secondary | ICD-10-CM

## 2022-06-14 DIAGNOSIS — Z8546 Personal history of malignant neoplasm of prostate: Secondary | ICD-10-CM | POA: Diagnosis not present

## 2022-06-14 DIAGNOSIS — I10 Essential (primary) hypertension: Secondary | ICD-10-CM | POA: Diagnosis not present

## 2022-06-14 DIAGNOSIS — E611 Iron deficiency: Secondary | ICD-10-CM | POA: Insufficient documentation

## 2022-06-14 DIAGNOSIS — F02C0 Dementia in other diseases classified elsewhere, severe, without behavioral disturbance, psychotic disturbance, mood disturbance, and anxiety: Secondary | ICD-10-CM | POA: Diagnosis not present

## 2022-06-14 DIAGNOSIS — K7460 Unspecified cirrhosis of liver: Secondary | ICD-10-CM | POA: Insufficient documentation

## 2022-06-14 DIAGNOSIS — R634 Abnormal weight loss: Secondary | ICD-10-CM | POA: Diagnosis not present

## 2022-06-14 DIAGNOSIS — C61 Malignant neoplasm of prostate: Secondary | ICD-10-CM

## 2022-06-14 DIAGNOSIS — K7469 Other cirrhosis of liver: Secondary | ICD-10-CM

## 2022-06-14 DIAGNOSIS — N179 Acute kidney failure, unspecified: Secondary | ICD-10-CM | POA: Diagnosis not present

## 2022-06-14 DIAGNOSIS — I864 Gastric varices: Secondary | ICD-10-CM | POA: Insufficient documentation

## 2022-06-14 DIAGNOSIS — Z66 Do not resuscitate: Secondary | ICD-10-CM | POA: Insufficient documentation

## 2022-06-14 DIAGNOSIS — D649 Anemia, unspecified: Secondary | ICD-10-CM

## 2022-06-14 DIAGNOSIS — D696 Thrombocytopenia, unspecified: Secondary | ICD-10-CM | POA: Insufficient documentation

## 2022-06-14 DIAGNOSIS — D500 Iron deficiency anemia secondary to blood loss (chronic): Secondary | ICD-10-CM

## 2022-06-14 DIAGNOSIS — D61818 Other pancytopenia: Secondary | ICD-10-CM | POA: Insufficient documentation

## 2022-06-14 DIAGNOSIS — B1920 Unspecified viral hepatitis C without hepatic coma: Secondary | ICD-10-CM | POA: Diagnosis not present

## 2022-06-14 LAB — BASIC METABOLIC PANEL
Anion gap: 9 (ref 5–15)
BUN: 8 mg/dL (ref 8–23)
CO2: 28 mmol/L (ref 22–32)
Calcium: 9 mg/dL (ref 8.9–10.3)
Chloride: 103 mmol/L (ref 98–111)
Creatinine, Ser: 0.72 mg/dL (ref 0.61–1.24)
GFR, Estimated: >60 mL/min (ref >60)
Glucose, Bld: 101 mg/dL — ABNORMAL HIGH (ref 70–99)
Potassium: 4.3 mmol/L (ref 3.5–5.1)
Sodium: 140 mmol/L (ref 135–145)

## 2022-06-14 LAB — CBC WITH DIFFERENTIAL/PLATELET
Abs Immature Granulocytes: 0 10*3/uL (ref 0.00–0.07)
Basophils Absolute: 0 10*3/uL (ref 0.0–0.1)
Basophils Relative: 1 %
Eosinophils Absolute: 0 10*3/uL (ref 0.0–0.5)
Eosinophils Relative: 2 %
HCT: 29.9 % — ABNORMAL LOW (ref 39.0–52.0)
Hemoglobin: 9.7 g/dL — ABNORMAL LOW (ref 13.0–17.0)
Immature Granulocytes: 0 %
Lymphocytes Relative: 34 %
Lymphs Abs: 0.9 10*3/uL (ref 0.7–4.0)
MCH: 28 pg (ref 26.0–34.0)
MCHC: 32.4 g/dL (ref 30.0–36.0)
MCV: 86.2 fL (ref 80.0–100.0)
Monocytes Absolute: 0.2 10*3/uL (ref 0.1–1.0)
Monocytes Relative: 6 %
Neutro Abs: 1.5 10*3/uL — ABNORMAL LOW (ref 1.7–7.7)
Neutrophils Relative %: 57 %
Platelets: 116 10*3/uL — ABNORMAL LOW (ref 150–400)
RBC: 3.47 MIL/uL — ABNORMAL LOW (ref 4.22–5.81)
RDW: 17.3 % — ABNORMAL HIGH (ref 11.5–15.5)
WBC: 2.6 10*3/uL — ABNORMAL LOW (ref 4.0–10.5)
nRBC: 0 % (ref 0.0–0.2)

## 2022-06-14 LAB — FERRITIN: Ferritin: 15 ng/mL — ABNORMAL LOW (ref 24–336)

## 2022-06-14 LAB — IRON AND TIBC
Iron: 45 ug/dL (ref 45–182)
Saturation Ratios: 11 % — ABNORMAL LOW (ref 17.9–39.5)
TIBC: 402 ug/dL (ref 250–450)
UIBC: 357 ug/dL

## 2022-06-14 LAB — FOLATE: Folate: 8.9 ng/mL (ref >5.9)

## 2022-06-14 LAB — VITAMIN B12: Vitamin B-12: 1354 pg/mL — ABNORMAL HIGH (ref 180–914)

## 2022-06-14 NOTE — Progress Notes (Signed)
Daniel Knapp  [phone] Fax:(336) [phone]  ID: [name] OB: September 18, 1950  MR#: [id number]  EFE#:[id number]  Patient Care Team: Derinda Late, MD as PCP - General (Family Medicine)  REFERRING PROVIDER: Dr. Manuella Ghazi  REASON FOR REFERRAL: Pancytopenia  HPI: Daniel Knapp is a 72 y.o. male from [facility] with past medical history of prostate cancer s/p EBRT in 2018, hypertension, anemia, Alzheimer's dementia was referred to hematology for pancytopenia.  Patient has severe dementia, baseline confusion, minimally verbal.  [name] is POA.  Patient has complex medical history and multiple issues going on. Patient was seen by Urology in July 2023.  He had TURP in 2017 with small amount of prostate cancer seen on that specimen.  For the prostate biopsy showed Gleason score 4+3 equal to 7 unfavorable intermediate risk status post radiation in 2018.  Posttreatment PSA 0.6.  Lost to follow-up with urology after that.  CT scan from July 2023 showed 4.1 cm soft tissue mass in the bladder, mild right-sided hydronephrosis and hydroureter. PSA trending up since July 2022.  PSA from 01/03/2022 100.8  From 10/20/2021 greater than 150.  She has decided with no invasive measures and any treatment for the prostate cancer.  Patient has had multiple admissions. He was admitted in July 2023 for GI bleed.  He had hemoglobin of 3.7 and required for blood transfusions.  He was then admitted in August 2023 for hematochezia which was thought to be from hemorrhoidal bleed.  Did not require any blood transfusions. Then admitted again in October 2023 for altered mental status.  Follows with neurology for Alzheimer's dementia which has been progressing.  He is minimally verbal and unable to participate in the conversation.  She does not want to see palliative care services at this time. For patient's sister, advanced directives are in place at [facility].  CODE  STATUS is DNR.   REVIEW OF SYSTEMS:   ROS  As per HPI. Otherwise, a complete review of systems is negative.  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-has seen Dr Delaine Lame   Prostate cancer [facility]) 02/2016   Prostate enlargement    Urinary catheter insertion/adjustment/removal    Urinary retention    UTI (lower urinary tract infection)    UTI symptoms    Weight loss     PAST SURGICAL HISTORY: Past Surgical History:  Procedure Laterality Date   ESOPHAGOGASTRODUODENOSCOPY N/A 11/21/2021   Procedure: ESOPHAGOGASTRODUODENOSCOPY (EGD);  Surgeon: Annamaria Helling, DO;  Location: [facility] ENDOSCOPY;  Service: Gastroenterology;  Laterality: N/A;   IR ANGIOGRAM SELECTIVE EACH ADDITIONAL VESSEL  11/25/2021   IR EMBO ART  VEN HEMORR LYMPH EXTRAV  INC GUIDE ROADMAPPING  11/25/2021   IR TRANSHEPATIC PORTOGRAM WO HEMO  11/25/2021   IR US GUIDE VASC ACCESS RIGHT  11/25/2021   TRANSURETHRAL RESECTION OF BLADDER TUMOR N/A 08/17/2015   Procedure: TRANSURETHRAL RESECTION OF BLADDER TUMOR (TURBT);  Surgeon: [name], MD;  Location: [facility] ORS;  Service: Urology;  Laterality: N/A;   TRANSURETHRAL RESECTION OF PROSTATE N/A 08/17/2015   Procedure: TRANSURETHRAL RESECTION OF THE PROSTATE (TURP);  Surgeon: [name], MD;  Location: [facility] ORS;  Service: Urology;  Laterality: N/A;   XI ROBOTIC  ASSISTED VENTRAL HERNIA N/A 09/30/2020   Procedure: XI ROBOTIC ASSISTED VENTRAL HERNIA;  Surgeon: Herbert Pun, MD;  Location: [facility] ORS;  Service: General;  Laterality: N/A;    FAMILY HISTORY: Family History  Problem Relation Age of Onset   Heart disease Mother    Cancer Father    Kidney disease Neg Hx    Prostate cancer Neg Hx     HEALTH MAINTENANCE: Social History   Tobacco Use    Smoking status: Never   Smokeless tobacco: Never  Vaping Use   Vaping Use: Never used  Substance Use Topics   Alcohol use: Not Currently    Alcohol/week: 0.0 standard drinks of alcohol   Drug use: Not Currently    Types: Marijuana    Comment: years ago     No Known Allergies  Current Outpatient Medications  Medication Sig Dispense Refill   cholecalciferol (VITAMIN D) 25 MCG (1000 UNIT) tablet Take 1,000 Units by mouth daily.     divalproex (DEPAKOTE SPRINKLE) 125 MG capsule Take 125 mg by mouth 2 (two) times daily.     donepezil (ARICEPT) 10 MG tablet Take 10 mg by mouth at bedtime.     finasteride (PROSCAR) 5 MG tablet Take 5 mg by mouth daily in the afternoon. Reported on 08/17/2015     memantine (NAMENDA) 5 MG tablet Take 5 mg by mouth 2 (two) times daily.     Multiple Vitamin (MULTIVITAMIN WITH MINERALS) TABS tablet Take 1 tablet by mouth daily in the afternoon.     omeprazole (PRILOSEC) 40 MG capsule Take 40 mg by mouth 2 (two) times daily.     polyethylene glycol (MIRALAX / GLYCOLAX) 17 g packet Take 17 g by mouth 2 (two) times daily. Until stooling regularly/soft stool then can decrease to daily or every other day 30 packet 11   terazosin (HYTRIN) 1 MG capsule Take 1 mg by mouth daily.     vitamin B-12 (CYANOCOBALAMIN) 1000 MCG tablet Take 1,000 mcg by mouth daily in the afternoon.     No current facility-administered medications for this visit.    OBJECTIVE: There were no vitals filed for this visit.   There is no height or weight on file to calculate BMI.      General: Well-developed, well-nourished, no acute distress. Eyes: Pink conjunctiva, anicteric sclera. HEENT: Normocephalic, moist mucous membranes, clear oropharnyx. Lungs: Clear to auscultation bilaterally. Heart: Regular rate and rhythm. No rubs, murmurs, or gallops. Abdomen: Soft, nontender, nondistended. No organomegaly noted, normoactive bowel sounds. Musculoskeletal: No edema, cyanosis, or clubbing. Neuro:  Alert, answering all questions appropriately. Cranial nerves grossly intact. Skin: No rashes or petechiae noted. Psych: Normal affect. Lymphatics: No cervical, calvicular, axillary or inguinal LAD.   LAB RESULTS:  Lab Results  Component Value Date   NA 135 05/31/2022   K 4.1 05/31/2022   CL 99 05/31/2022   CO2 23 05/31/2022   GLUCOSE 133 (H) 05/31/2022   BUN 18 05/31/2022   CREATININE 1.53 (H) 05/31/2022   CALCIUM 9.5 05/31/2022   PROT 8.1 05/31/2022   ALBUMIN 3.9 05/31/2022   AST 27 05/31/2022   ALT 30 05/31/2022   ALKPHOS 60 05/31/2022   BILITOT 0.8 05/31/2022   GFRNONAA 48 (L) 05/31/2022   GFRAA >60 08/19/2015    Lab Results  Component Value Date   WBC 4.5 05/31/2022   NEUTROABS 3.1 05/31/2022   HGB 11.0 (L) 05/31/2022   HCT 35.8 (L) 05/31/2022   MCV 86.3 05/31/2022   PLT 207 05/31/2022  Lab Results  Component Value Date   TIBC 342 01/18/2022   TIBC 469 (H) 11/20/2021   FERRITIN 14 (L) 01/18/2022   FERRITIN 4 (L) 11/20/2021   IRONPCTSAT 6 (L) 01/18/2022   IRONPCTSAT 2 (L) 11/20/2021     STUDIES: DG Chest 2 View  Result Date: 05/31/2022 CLINICAL DATA:  Weakness. EXAM: CHEST - 2 VIEW COMPARISON:  April 09, 2022. FINDINGS: The heart size and mediastinal contours are within normal limits. Both lungs are clear. The visualized skeletal structures are unremarkable. IMPRESSION: No active cardiopulmonary disease. Electronically Signed   By: Marijo Conception M.D.   On: 05/31/2022 16:14   CT Head Wo Contrast  Result Date: 05/31/2022 CLINICAL DATA:  Syncope/presyncope, cerebrovascular cause suspected. Seizure. EXAM: CT HEAD WITHOUT CONTRAST TECHNIQUE: Contiguous axial images were obtained from the base of the skull through the vertex without intravenous contrast. RADIATION DOSE REDUCTION: This exam was performed according to the departmental dose-optimization program which includes automated exposure control, adjustment of the mA and/or kV according to patient size  and/or use of iterative reconstruction technique. COMPARISON:  Head CT and MRI 04/09/2022 FINDINGS: Brain: There is no evidence of an acute infarct, intracranial hemorrhage, mass, midline shift, or extra-axial fluid collection. Moderate cerebral atrophy is again noted. Hypodensities in the cerebral white matter are unchanged and nonspecific but compatible with mild chronic small vessel ischemic disease. Calcified atherosclerosis crash that Vascular: Calcified atherosclerosis at the skull base. No hyperdense vessel. Skull: No acute fracture or suspicious osseous lesion. Sinuses/Orbits: Mild left ethmoid air cell mucosal thickening. Visualized mastoid air cells are clear. Visualized portions of the orbits are unremarkable. Other: None. IMPRESSION: 1. No evidence of acute intracranial abnormality. 2. Mild chronic small vessel ischemic disease and moderate cerebral atrophy. Electronically Signed   By: Logan Bores M.D.   On: 05/31/2022 16:08    ASSESSMENT AND PLAN:   VELDON WAGER is a 72 y.o. male from [facility] with past medical history of prostate cancer s/p EBRT in 2018, hypertension, anemia, Alzheimer's dementia, Hep C untreated was referred to hematology for pancytopenia.  # Pancytopenia -Leukopenia and thrombocytopenia likely related to liver cirrhosis and Hep C.  -Hemoglobin mildly low at 11.  Iron panel from August showed iron deficiency with ferritin 14 and saturation 6%. -I will repeat the labs today as below. -Advised to start oral iron 325 mg daily.  # Suspicion for recurrent metastatic prostate cancer - Patient has hx of Gleason score 4+3 unfavorable intermediate risk prostate cancer s/p EBRT in 2018. Unclear if received ADT. Lost to follow up with Urology. Now with PSA trending up concerning for recurrence. CT abdo from July 2023 showed large bladder mass, RP lymphadenopathy. Was seen by Dr. Diamantina Providence. Sister decided not to proceed with any invasive procedures or cancer treatments. She  continues to have the same plan.   - I discussed with the sister about Palliative care referral. She declined. She reports that advance directives are in place with DNR code status. We also discussed about comfort care and hospice which she declined at this time.   #Weight loss - he has weight loss likely secondary to prostate cancer and dementia unable to maintain calorie intake.   #Liver cirrhosis #Hep C #GI bleed - He was admitted in July 2023 for GI bleed.  Had hemoglobin of 3.7 and required for blood transfusions.  He was then admitted in August 2023 for hematochezia which was thought to be from hemorrhoidal bleed.  Did not require any blood  transfusions. 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  - Scheduled to see Gi at [facility] clinic in April 2024.   #Alzheimer's dementia - progressive, follows with Dr. Manuella Ghazi   #AKI - Cr elevated 1.5 on 12/21. Patient has bladder mass and mild hydronephrosis on prior scans  - repeat BMP today    Orders Placed This Encounter  Procedures   Basic metabolic panel   Iron and TIBC   Ferritin   Vitamin B12   Folate   CBC with Differential   CBC with Differential/Platelet   Iron and TIBC   Ferritin   Comprehensive metabolic panel   RTC 3 months md visit, labs Patient expressed understanding and was in agreement with this plan. He also understands that He can call clinic at any time with any questions, concerns, or complaints.   I spent a total of 45 minutes reviewing chart data, face-to-face evaluation with the patient, counseling and coordination of care as detailed above.  Jane Canary, MD   06/14/2022 10:41 AM

## 2022-06-28 ENCOUNTER — Ambulatory Visit: Payer: Medicare Other | Admitting: Urology

## 2022-07-12 DIAGNOSIS — Z741 Need for assistance with personal care: Secondary | ICD-10-CM | POA: Diagnosis not present

## 2022-07-12 DIAGNOSIS — M6281 Muscle weakness (generalized): Secondary | ICD-10-CM | POA: Diagnosis not present

## 2022-07-13 DIAGNOSIS — Z741 Need for assistance with personal care: Secondary | ICD-10-CM | POA: Diagnosis not present

## 2022-07-13 DIAGNOSIS — M6281 Muscle weakness (generalized): Secondary | ICD-10-CM | POA: Diagnosis not present

## 2022-07-16 DIAGNOSIS — Z741 Need for assistance with personal care: Secondary | ICD-10-CM | POA: Diagnosis not present

## 2022-07-16 DIAGNOSIS — M6281 Muscle weakness (generalized): Secondary | ICD-10-CM | POA: Diagnosis not present

## 2022-07-17 DIAGNOSIS — Z741 Need for assistance with personal care: Secondary | ICD-10-CM | POA: Diagnosis not present

## 2022-07-17 DIAGNOSIS — M6281 Muscle weakness (generalized): Secondary | ICD-10-CM | POA: Diagnosis not present

## 2022-07-18 DIAGNOSIS — Z741 Need for assistance with personal care: Secondary | ICD-10-CM | POA: Diagnosis not present

## 2022-07-18 DIAGNOSIS — M6281 Muscle weakness (generalized): Secondary | ICD-10-CM | POA: Diagnosis not present

## 2022-07-20 DIAGNOSIS — Z741 Need for assistance with personal care: Secondary | ICD-10-CM | POA: Diagnosis not present

## 2022-07-20 DIAGNOSIS — M6281 Muscle weakness (generalized): Secondary | ICD-10-CM | POA: Diagnosis not present

## 2022-07-22 DIAGNOSIS — Z741 Need for assistance with personal care: Secondary | ICD-10-CM | POA: Diagnosis not present

## 2022-07-22 DIAGNOSIS — M6281 Muscle weakness (generalized): Secondary | ICD-10-CM | POA: Diagnosis not present

## 2022-07-23 DIAGNOSIS — M6281 Muscle weakness (generalized): Secondary | ICD-10-CM | POA: Diagnosis not present

## 2022-07-23 DIAGNOSIS — Z741 Need for assistance with personal care: Secondary | ICD-10-CM | POA: Diagnosis not present

## 2022-07-25 DIAGNOSIS — M6281 Muscle weakness (generalized): Secondary | ICD-10-CM | POA: Diagnosis not present

## 2022-07-25 DIAGNOSIS — E559 Vitamin D deficiency, unspecified: Secondary | ICD-10-CM | POA: Diagnosis not present

## 2022-07-25 DIAGNOSIS — Z741 Need for assistance with personal care: Secondary | ICD-10-CM | POA: Diagnosis not present

## 2022-07-25 DIAGNOSIS — G3189 Other specified degenerative diseases of nervous system: Secondary | ICD-10-CM | POA: Diagnosis not present

## 2022-07-26 DIAGNOSIS — M6281 Muscle weakness (generalized): Secondary | ICD-10-CM | POA: Diagnosis not present

## 2022-07-26 DIAGNOSIS — K922 Gastrointestinal hemorrhage, unspecified: Secondary | ICD-10-CM | POA: Diagnosis not present

## 2022-07-26 DIAGNOSIS — E559 Vitamin D deficiency, unspecified: Secondary | ICD-10-CM | POA: Diagnosis not present

## 2022-07-26 DIAGNOSIS — Z741 Need for assistance with personal care: Secondary | ICD-10-CM | POA: Diagnosis not present

## 2022-07-26 DIAGNOSIS — D509 Iron deficiency anemia, unspecified: Secondary | ICD-10-CM | POA: Diagnosis not present

## 2022-07-27 ENCOUNTER — Ambulatory Visit: Payer: Medicare Other | Admitting: Urology

## 2022-07-27 DIAGNOSIS — M6281 Muscle weakness (generalized): Secondary | ICD-10-CM | POA: Diagnosis not present

## 2022-07-27 DIAGNOSIS — Z741 Need for assistance with personal care: Secondary | ICD-10-CM | POA: Diagnosis not present

## 2022-07-28 DIAGNOSIS — Z741 Need for assistance with personal care: Secondary | ICD-10-CM | POA: Diagnosis not present

## 2022-07-28 DIAGNOSIS — M6281 Muscle weakness (generalized): Secondary | ICD-10-CM | POA: Diagnosis not present

## 2022-07-30 DIAGNOSIS — M6281 Muscle weakness (generalized): Secondary | ICD-10-CM | POA: Diagnosis not present

## 2022-07-30 DIAGNOSIS — Z741 Need for assistance with personal care: Secondary | ICD-10-CM | POA: Diagnosis not present

## 2022-07-31 DIAGNOSIS — Z741 Need for assistance with personal care: Secondary | ICD-10-CM | POA: Diagnosis not present

## 2022-07-31 DIAGNOSIS — M6281 Muscle weakness (generalized): Secondary | ICD-10-CM | POA: Diagnosis not present

## 2022-08-09 DIAGNOSIS — Z7289 Other problems related to lifestyle: Secondary | ICD-10-CM | POA: Diagnosis not present

## 2022-08-09 DIAGNOSIS — R634 Abnormal weight loss: Secondary | ICD-10-CM | POA: Diagnosis not present

## 2022-08-09 DIAGNOSIS — F0390 Unspecified dementia without behavioral disturbance: Secondary | ICD-10-CM | POA: Diagnosis not present

## 2022-08-15 DIAGNOSIS — K625 Hemorrhage of anus and rectum: Secondary | ICD-10-CM | POA: Diagnosis not present

## 2022-08-21 DIAGNOSIS — L603 Nail dystrophy: Secondary | ICD-10-CM | POA: Diagnosis not present

## 2022-08-21 DIAGNOSIS — I739 Peripheral vascular disease, unspecified: Secondary | ICD-10-CM | POA: Diagnosis not present

## 2022-09-02 ENCOUNTER — Emergency Department
Admission: EM | Admit: 2022-09-02 | Discharge: 2022-09-02 | Disposition: A | Discharge status: Skilled Nursing Facility | Payer: Medicare HMO | Attending: Emergency Medicine | Admitting: Emergency Medicine

## 2022-09-02 ENCOUNTER — Emergency Department: Payer: Medicare HMO

## 2022-09-02 ENCOUNTER — Other Ambulatory Visit: Payer: Self-pay

## 2022-09-02 DIAGNOSIS — F0390 Unspecified dementia without behavioral disturbance: Secondary | ICD-10-CM | POA: Insufficient documentation

## 2022-09-02 DIAGNOSIS — G309 Alzheimer's disease, unspecified: Secondary | ICD-10-CM | POA: Insufficient documentation

## 2022-09-02 DIAGNOSIS — R55 Syncope and collapse: Secondary | ICD-10-CM

## 2022-09-02 DIAGNOSIS — R404 Transient alteration of awareness: Secondary | ICD-10-CM | POA: Diagnosis not present

## 2022-09-02 DIAGNOSIS — R531 Weakness: Secondary | ICD-10-CM | POA: Diagnosis not present

## 2022-09-02 DIAGNOSIS — Z20822 Contact with and (suspected) exposure to covid-19: Secondary | ICD-10-CM | POA: Diagnosis not present

## 2022-09-02 DIAGNOSIS — Z8546 Personal history of malignant neoplasm of prostate: Secondary | ICD-10-CM | POA: Diagnosis not present

## 2022-09-02 DIAGNOSIS — R4182 Altered mental status, unspecified: Secondary | ICD-10-CM | POA: Diagnosis not present

## 2022-09-02 DIAGNOSIS — I10 Essential (primary) hypertension: Secondary | ICD-10-CM | POA: Diagnosis not present

## 2022-09-02 DIAGNOSIS — E876 Hypokalemia: Secondary | ICD-10-CM

## 2022-09-02 DIAGNOSIS — N390 Urinary tract infection, site not specified: Secondary | ICD-10-CM | POA: Diagnosis not present

## 2022-09-02 DIAGNOSIS — R0602 Shortness of breath: Secondary | ICD-10-CM | POA: Diagnosis not present

## 2022-09-02 DIAGNOSIS — Z8551 Personal history of malignant neoplasm of bladder: Secondary | ICD-10-CM | POA: Diagnosis not present

## 2022-09-02 DIAGNOSIS — Z743 Need for continuous supervision: Secondary | ICD-10-CM | POA: Diagnosis not present

## 2022-09-02 LAB — URINALYSIS, ROUTINE W REFLEX MICROSCOPIC
Bacteria, UA: NONE SEEN
Bilirubin Urine: NEGATIVE
Glucose, UA: NEGATIVE mg/dL
Hgb urine dipstick: NEGATIVE
Ketones, ur: NEGATIVE mg/dL
Nitrite: NEGATIVE
Protein, ur: NEGATIVE mg/dL
Specific Gravity, Urine: 1.014 (ref 1.005–1.030)
pH: 5 (ref 5.0–8.0)

## 2022-09-02 LAB — COMPREHENSIVE METABOLIC PANEL
ALT: 54 U/L — ABNORMAL HIGH (ref 0–44)
AST: 36 U/L (ref 15–41)
Albumin: 3.4 g/dL — ABNORMAL LOW (ref 3.5–5.0)
Alkaline Phosphatase: 54 U/L (ref 38–126)
Anion gap: 8 (ref 5–15)
BUN: 12 mg/dL (ref 8–23)
CO2: 24 mmol/L (ref 22–32)
Calcium: 8.5 mg/dL — ABNORMAL LOW (ref 8.9–10.3)
Chloride: 105 mmol/L (ref 98–111)
Creatinine, Ser: 0.82 mg/dL (ref 0.61–1.24)
GFR, Estimated: >60 mL/min (ref >60)
Glucose, Bld: 138 mg/dL — ABNORMAL HIGH (ref 70–99)
Potassium: 2.9 mmol/L — ABNORMAL LOW (ref 3.5–5.1)
Sodium: 137 mmol/L (ref 135–145)
Total Bilirubin: 0.4 mg/dL (ref 0.3–1.2)
Total Protein: 7.3 g/dL (ref 6.5–8.1)

## 2022-09-02 LAB — CBC WITH DIFFERENTIAL/PLATELET
Abs Immature Granulocytes: 0.01 10*3/uL (ref 0.00–0.07)
Basophils Absolute: 0 10*3/uL (ref 0.0–0.1)
Basophils Relative: 0 %
Eosinophils Absolute: 0.1 10*3/uL (ref 0.0–0.5)
Eosinophils Relative: 1 %
HCT: 37.3 % — ABNORMAL LOW (ref 39.0–52.0)
Hemoglobin: 11.7 g/dL — ABNORMAL LOW (ref 13.0–17.0)
Immature Granulocytes: 0 %
Lymphocytes Relative: 28 %
Lymphs Abs: 1.3 10*3/uL (ref 0.7–4.0)
MCH: 29.3 pg (ref 26.0–34.0)
MCHC: 31.4 g/dL (ref 30.0–36.0)
MCV: 93.5 fL (ref 80.0–100.0)
Monocytes Absolute: 0.3 10*3/uL (ref 0.1–1.0)
Monocytes Relative: 6 %
Neutro Abs: 3.1 10*3/uL (ref 1.7–7.7)
Neutrophils Relative %: 65 %
Platelets: 103 10*3/uL — ABNORMAL LOW (ref 150–400)
RBC: 3.99 MIL/uL — ABNORMAL LOW (ref 4.22–5.81)
RDW: 14.3 % (ref 11.5–15.5)
WBC: 4.8 10*3/uL (ref 4.0–10.5)
nRBC: 0 % (ref 0.0–0.2)

## 2022-09-02 LAB — RESP PANEL BY RT-PCR (RSV, FLU A&B, COVID)  RVPGX2
Influenza A by PCR: NEGATIVE
Influenza B by PCR: NEGATIVE
Resp Syncytial Virus by PCR: NEGATIVE
SARS Coronavirus 2 by RT PCR: NEGATIVE

## 2022-09-02 LAB — MAGNESIUM: Magnesium: 1.9 mg/dL (ref 1.7–2.4)

## 2022-09-02 LAB — TROPONIN I (HIGH SENSITIVITY): Troponin I (High Sensitivity): 3 ng/L (ref <18)

## 2022-09-02 MED ORDER — LACTATED RINGERS IV BOLUS
1000.0000 mL | Freq: Once | INTRAVENOUS | Status: AC
  Administered 2022-09-02: 1000 mL via INTRAVENOUS

## 2022-09-02 MED ORDER — POTASSIUM CHLORIDE CRYS ER 20 MEQ PO TBCR: 20.0000 meq | EXTENDED_RELEASE_TABLET | Freq: Two times a day (BID) | ORAL | 0 refills | Status: DC

## 2022-09-02 MED ORDER — POTASSIUM CHLORIDE 20 MEQ PO PACK
40.0000 meq | PACK | Freq: Two times a day (BID) | ORAL | Status: DC
  Administered 2022-09-02: 40 meq via ORAL
  Filled 2022-09-02: qty 2

## 2022-09-02 MED ORDER — CEPHALEXIN 500 MG PO CAPS
500.0000 mg | ORAL_CAPSULE | Freq: Once | ORAL | Status: AC
  Administered 2022-09-02: 500 mg via ORAL
  Filled 2022-09-02: qty 1

## 2022-09-02 MED ORDER — POTASSIUM CHLORIDE CRYS ER 20 MEQ PO TBCR: 40.0000 meq | EXTENDED_RELEASE_TABLET | Freq: Once | ORAL | Status: DC

## 2022-09-02 MED ORDER — CEPHALEXIN 500 MG PO CAPS: 500.0000 mg | ORAL_CAPSULE | Freq: Four times a day (QID) | ORAL | 0 refills | Status: AC

## 2022-09-02 NOTE — Discharge Instructions (Addendum)
Your potassium was low at 2.9.  Please take the potassium supplement twice a day for the next 5 days.  You also have white blood cells in your urine could have a UTI.  Please take the antibiotic 4 times a day for the next 7 days.

## 2022-09-02 NOTE — ED Provider Notes (Signed)
[facility] Provider Note    Event Date/Time   First MD Initiated Contact with Patient 09/02/22 1034     (approximate)   History   Near Syncope   HPI  LEMARION WALSINGHAM is a 72 y.o. male past medical history of Alzheimer's dementia, prostate cancer bladder cancer anemia hypertension who presents because of an episode of altered level of consciousness.  EMS says that patient went unresponsive.  He had cereal coming out of his nose when he awoke.  Unable to obtain any history from the patient.  I spoke with nursing at [facility] where patient stays.  He was in the day room and was eating cereal on his own.  They would typically feed him but he got access to someone else's cereal.  When they went back in he had slumped over and was minimally responsive but was breathing spontaneously.  Required sternal rub and he would come to but was not back to baseline.  They did see his eyes rolled back.  By the time EMS arrived she was still not back at baseline.  He is chronically incontinent.  They did not observe any shaking activity.  I reviewed admission from October 2023 when patient presented with an episode of shaking.  Patient was seen by neurology and he did have a witnessed shaking episode that neurology felt was more behavioral had a spot EEG that was normal and was cleared not started on antiepileptics.  Also reviewed Dr. Trena Platt note from 06/18/2022 when it is documented that patient has recurrent episodes of syncope where he has altered mental status his eyes rolled back and he is not responsive.  He is on Depakote 125 mg twice daily unclear if this is for seizures or behavior.  At baseline patient is awake and alert but is nonverbal.    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) 12/11/2021   Abnormal CT scan, gastrointestinal tract    Iron deficiency anemia 11/23/2021   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/20/21 11/23/2021   Cirrhosis ([place]) 11/23/2021   Upper GI bleed 11/23/2021   Symptomatic anemia 11/20/2021   HTN (hypertension)    Dementia ([place])    Hepatitis    Hypokalemia    Fall    Prostate cancer ([place]) 01/23/2016   BPH (benign prostatic hyperplasia) 08/17/2015   Urinary retention 11/10/2014   BPH (benign prostatic hypertrophy) with urinary retention 11/10/2014     Physical Exam  Triage Vital Signs: ED Triage Vitals  Enc Vitals Group     BP 09/02/22 1033 100/79     Pulse Rate 09/02/22 1033 72     Resp 09/02/22 1033 18     Temp --  Temp src --      SpO2 09/02/22 1033 100 %     Weight 09/02/22 1032 144 lb 13.5 oz (65.7 kg)     Height 09/02/22 1032 6\' 2"  (1.88 m)     Head Circumference --      Peak Flow --      Pain Score 09/02/22 1032 0     Pain Loc --      Pain Edu? --      Excl. in [place]? --     Most recent vital signs: Vitals:   09/02/22 1325 09/02/22 1400  BP: (!) 150/97 (!) 138/92  Pulse: (!) 104 85  Resp:    Temp:    SpO2: 100% 98%     General: Awake, no distress.  CV:  Good peripheral perfusion.  Resp:  Normal effort.  Abd:  No distention.  Neuro:             Awake, Alert, patient looks around and does follow some minimal commands including wiggling his fingers and toes does not make any verbal  response Other:     ED Results / Procedures / Treatments  Labs (all labs ordered are listed, but only abnormal results are displayed) Labs Reviewed  COMPREHENSIVE METABOLIC PANEL - Abnormal; Notable for the following components:      Result Value   Potassium 2.9 (*)    Glucose, Bld 138 (*)    Calcium 8.5 (*)    Albumin 3.4 (*)    ALT 54 (*)    All other components within normal limits  CBC WITH DIFFERENTIAL/PLATELET - Abnormal; Notable for the following components:   RBC 3.99 (*)    Hemoglobin 11.7 (*)    HCT 37.3 (*)    Platelets 103 (*)    All other components within normal limits  URINALYSIS, ROUTINE W REFLEX MICROSCOPIC - Abnormal; Notable for the following components:   Color, Urine YELLOW (*)    APPearance HAZY (*)    Leukocytes,Ua TRACE (*)    All other components within normal limits  RESP PANEL BY RT-PCR (RSV, FLU A&B, COVID)  [id number]  URINE CULTURE  MAGNESIUM  TROPONIN I (HIGH SENSITIVITY)     EKG  EKG reviewed interpreted myself shows sinus rhythm with normal axis and intervals large T waves in V3 no clear ischemic changes   RADIOLOGY I reviewed and interpreted the CT scan of the brain which does not show any acute intracranial process    PROCEDURES:  Critical Care performed: No  Procedures  The patient is on the cardiac monitor to evaluate for evidence of arrhythmia and/or significant heart rate changes.   MEDICATIONS ORDERED IN ED: Medications  cephALEXin (KEFLEX) capsule 500 mg (has no administration in time range)  lactated ringers bolus 1,000 mL (0 mLs Intravenous Stopped 09/02/22 1332)  lactated ringers bolus 1,000 mL (0 mLs Intravenous Stopped 09/02/22 1438)     IMPRESSION / MDM / ASSESSMENT AND PLAN / ED COURSE  I reviewed the triage vital signs and the nursing notes.                              Patient's presentation is most consistent with acute complicated illness / injury requiring diagnostic workup.  Differential diagnosis  includes, but is not limited to, syncope, seizure, aspiration, hypoglycemia  The patient is a 72 year old male history of dementia prior episodes of syncope who presents after an episode of loss of consciousness.  I spoke with [facility] directly who notes that patient was in the day room eating cereal unassisted when they found him slumped over minimally responsive.  When they stood him up he did have cereal coming out of his nose and mouth.  They sternal rub to him and he became slightly more responsive but then his eyes rolled back again.  Did not have any shaking activity.  Did not return to baseline by time EMS arrived.  EMS notes initially he was not very responsive but on the drive over he became more responsive said ouch and was essentially awake but nonverbal which is his baseline.  Patient's vital signs are reassuring blood sugar was normal on exam he is awake with eyes open does track and some follows some basic commands but is nonverbal.  This appears to be his baseline per history.  Unclear if he has history of true seizures he is on Depakote but admission from October notes that he had normal EEG and the seizure-like activity was thought not to be true seizures by neurology.  He sees Dr. Manuella Ghazi with neurology and per last note in January he has repeated syncopal episodes that are associated with his eyes rolling back and altered mental status.  I do agree that this event sounds to be more syncopal versus seizure.  Given history of somewhat unclear will obtain CT head CBC CMP troponin and EKG.  Will discuss with patient's sister.  Labs notable for mild hypokalemia potassium 2.9 which was supplemented orally.  Mag is within normal limits.  Send CBC and CMP are reassuring.  Did obtain troponin given somewhat peaked appearance of lead V2 on EKG although on review this does look similar to prior EKGs.  Troponin negative patient UA obtained shows 21-50 white cells.  No bacteria.  Unclear if patient is  having urinary symptoms, given his dementia and possible change in mental status today we will have low special to treat.  Will give 7 days of Keflex.  Urine culture added.  Assessed patient multiple times.  He does respond appropriately and appears to be at his baseline.  Given he is back to baseline I think that he can safely be discharged back to his facility.  There is concern for possible aspiration given that patient did have food coming out of his mouth after he syncopized but he remained on room air saturating well without respiratory distress with clear chest x-ray.       FINAL CLINICAL IMPRESSION(S) / ED DIAGNOSES   Final diagnoses:  Syncope, unspecified syncope type  Hypokalemia  Urinary tract infection without hematuria, site unspecified     Rx / DC Orders   ED Discharge Orders     None        Note:  This document was prepared using Dragon voice recognition software and may include unintentional dictation errors.   Rada Hay, MD 09/02/22 [phone]

## 2022-09-04 LAB — URINE CULTURE: Culture: >=100000 — AB

## 2022-09-05 ENCOUNTER — Ambulatory Visit: Payer: Medicare HMO | Admitting: Urology

## 2022-09-05 DIAGNOSIS — Z7289 Other problems related to lifestyle: Secondary | ICD-10-CM | POA: Diagnosis not present

## 2022-09-05 DIAGNOSIS — D649 Anemia, unspecified: Secondary | ICD-10-CM | POA: Diagnosis not present

## 2022-09-05 DIAGNOSIS — F0390 Unspecified dementia without behavioral disturbance: Secondary | ICD-10-CM | POA: Diagnosis not present

## 2022-09-05 DIAGNOSIS — E559 Vitamin D deficiency, unspecified: Secondary | ICD-10-CM | POA: Diagnosis not present

## 2022-09-05 DIAGNOSIS — R451 Restlessness and agitation: Secondary | ICD-10-CM | POA: Diagnosis not present

## 2022-09-05 DIAGNOSIS — K219 Gastro-esophageal reflux disease without esophagitis: Secondary | ICD-10-CM | POA: Diagnosis not present

## 2022-09-05 DIAGNOSIS — N400 Enlarged prostate without lower urinary tract symptoms: Secondary | ICD-10-CM | POA: Diagnosis not present

## 2022-09-06 ENCOUNTER — Telehealth: Payer: Self-pay

## 2022-09-06 DIAGNOSIS — N390 Urinary tract infection, site not specified: Secondary | ICD-10-CM | POA: Diagnosis not present

## 2022-09-06 DIAGNOSIS — R4182 Altered mental status, unspecified: Secondary | ICD-10-CM | POA: Diagnosis not present

## 2022-09-06 DIAGNOSIS — E119 Type 2 diabetes mellitus without complications: Secondary | ICD-10-CM | POA: Diagnosis not present

## 2022-09-06 NOTE — Telephone Encounter (Signed)
     Patient  visit on 3/24  at [facility]    Have you been able to follow up with your primary care physician? Yes   The patient was or was not able to obtain any needed medicine or equipment. Yes   Are there diet recommendations that you are having difficulty following? Na    Patient expresses understanding of discharge instructions and education provided has no other needs at this time.  Yes      [facility] [phone] [address], [place], [place] [address] Phone: [phone] Email: [email or web address]

## 2022-09-07 DIAGNOSIS — R051 Acute cough: Secondary | ICD-10-CM | POA: Diagnosis not present

## 2022-09-13 ENCOUNTER — Encounter: Payer: Self-pay | Admitting: Internal Medicine

## 2022-09-13 ENCOUNTER — Inpatient Hospital Stay (HOSPITAL_BASED_OUTPATIENT_CLINIC_OR_DEPARTMENT_OTHER): Payer: Medicare HMO | Admitting: Internal Medicine

## 2022-09-13 ENCOUNTER — Inpatient Hospital Stay: Payer: Medicare HMO | Attending: Internal Medicine

## 2022-09-13 VITALS — BP 137/82 | HR 71 | Temp 94.3°F | Resp 16 | Wt 149.9 lb

## 2022-09-13 DIAGNOSIS — F02C0 Dementia in other diseases classified elsewhere, severe, without behavioral disturbance, psychotic disturbance, mood disturbance, and anxiety: Secondary | ICD-10-CM | POA: Insufficient documentation

## 2022-09-13 DIAGNOSIS — Z66 Do not resuscitate: Secondary | ICD-10-CM | POA: Diagnosis not present

## 2022-09-13 DIAGNOSIS — D509 Iron deficiency anemia, unspecified: Secondary | ICD-10-CM | POA: Insufficient documentation

## 2022-09-13 DIAGNOSIS — D61818 Other pancytopenia: Secondary | ICD-10-CM

## 2022-09-13 DIAGNOSIS — C61 Malignant neoplasm of prostate: Secondary | ICD-10-CM

## 2022-09-13 DIAGNOSIS — Z8546 Personal history of malignant neoplasm of prostate: Secondary | ICD-10-CM | POA: Insufficient documentation

## 2022-09-13 DIAGNOSIS — K7460 Unspecified cirrhosis of liver: Secondary | ICD-10-CM | POA: Diagnosis not present

## 2022-09-13 DIAGNOSIS — N179 Acute kidney failure, unspecified: Secondary | ICD-10-CM

## 2022-09-13 DIAGNOSIS — D500 Iron deficiency anemia secondary to blood loss (chronic): Secondary | ICD-10-CM

## 2022-09-13 DIAGNOSIS — D649 Anemia, unspecified: Secondary | ICD-10-CM

## 2022-09-13 DIAGNOSIS — R634 Abnormal weight loss: Secondary | ICD-10-CM | POA: Insufficient documentation

## 2022-09-13 DIAGNOSIS — G309 Alzheimer's disease, unspecified: Secondary | ICD-10-CM | POA: Diagnosis not present

## 2022-09-13 DIAGNOSIS — B1920 Unspecified viral hepatitis C without hepatic coma: Secondary | ICD-10-CM | POA: Diagnosis not present

## 2022-09-13 LAB — CBC WITH DIFFERENTIAL/PLATELET
Abs Immature Granulocytes: 0 10*3/uL (ref 0.00–0.07)
Basophils Absolute: 0 10*3/uL (ref 0.0–0.1)
Basophils Relative: 1 %
Eosinophils Absolute: 0.1 10*3/uL (ref 0.0–0.5)
Eosinophils Relative: 2 %
HCT: 39 % (ref 39.0–52.0)
Hemoglobin: 12.5 g/dL — ABNORMAL LOW (ref 13.0–17.0)
Immature Granulocytes: 0 %
Lymphocytes Relative: 26 %
Lymphs Abs: 0.8 10*3/uL (ref 0.7–4.0)
MCH: 30 pg (ref 26.0–34.0)
MCHC: 32.1 g/dL (ref 30.0–36.0)
MCV: 93.5 fL (ref 80.0–100.0)
Monocytes Absolute: 0.2 10*3/uL (ref 0.1–1.0)
Monocytes Relative: 6 %
Neutro Abs: 2 10*3/uL (ref 1.7–7.7)
Neutrophils Relative %: 65 %
Platelets: 127 10*3/uL — ABNORMAL LOW (ref 150–400)
RBC: 4.17 MIL/uL — ABNORMAL LOW (ref 4.22–5.81)
RDW: 14.3 % (ref 11.5–15.5)
WBC: 3 10*3/uL — ABNORMAL LOW (ref 4.0–10.5)
nRBC: 0 % (ref 0.0–0.2)

## 2022-09-13 LAB — FERRITIN: Ferritin: 31 ng/mL (ref 24–336)

## 2022-09-13 LAB — COMPREHENSIVE METABOLIC PANEL
ALT: 58 U/L — ABNORMAL HIGH (ref 0–44)
AST: 44 U/L — ABNORMAL HIGH (ref 15–41)
Albumin: 4 g/dL (ref 3.5–5.0)
Alkaline Phosphatase: 73 U/L (ref 38–126)
Anion gap: 7 (ref 5–15)
BUN: 13 mg/dL (ref 8–23)
CO2: 27 mmol/L (ref 22–32)
Calcium: 9.2 mg/dL (ref 8.9–10.3)
Chloride: 102 mmol/L (ref 98–111)
Creatinine, Ser: 0.81 mg/dL (ref 0.61–1.24)
GFR, Estimated: >60 mL/min (ref >60)
Glucose, Bld: 142 mg/dL — ABNORMAL HIGH (ref 70–99)
Potassium: 3.5 mmol/L (ref 3.5–5.1)
Sodium: 136 mmol/L (ref 135–145)
Total Bilirubin: 0.3 mg/dL (ref 0.3–1.2)
Total Protein: 8.2 g/dL — ABNORMAL HIGH (ref 6.5–8.1)

## 2022-09-13 LAB — IRON AND TIBC
Iron: 50 ug/dL (ref 45–182)
Saturation Ratios: 14 % — ABNORMAL LOW (ref 17.9–39.5)
TIBC: 370 ug/dL (ref 250–450)
UIBC: 320 ug/dL

## 2022-09-13 LAB — PSA: Prostatic Specific Antigen: 72.25 ng/mL — ABNORMAL HIGH (ref 0.00–4.00)

## 2022-09-13 NOTE — Progress Notes (Signed)
Daniel Knapp  [phone]) [phone] Fax:(336) [phone]  ID: Daniel Knapp OB: Oct 06, 1950  MR#: [id number]  [id number]  Patient Care Team: Derinda Late, MD as PCP - General (Family Medicine)  HPI: Daniel Knapp is a 72 y.o. male from [facility] with past medical history of prostate cancer s/p EBRT in 2018, hypertension, anemia, Alzheimer's dementia was referred to hematology for pancytopenia.  Patient has severe dementia, baseline confusion, minimally verbal.  [name] is POA.  Patient has complex medical history and multiple issues going on. Patient was seen by Urology in July 2023.  He had TURP in 2017 with small amount of prostate cancer seen on that specimen.  For the prostate biopsy showed Gleason score 4+3 equal to 7 unfavorable intermediate risk status post radiation in 2018.  Posttreatment PSA 0.6.  Lost to follow-up with urology after that.  CT scan from July 2023 showed 4.1 cm soft tissue mass in the bladder, mild right-sided hydronephrosis and hydroureter. PSA trending up since July 2022.  PSA from 01/03/2022 100.8  From 10/20/2021 greater than 150.  She has decided with no invasive measures and any treatment for the prostate cancer.  Patient has had multiple admissions. He was admitted in July 2023 for GI bleed.  He had hemoglobin of 3.7 and required for blood transfusions.  He was then admitted in August 2023 for hematochezia which was thought to be from hemorrhoidal bleed.  Did not require any blood transfusions. Then admitted again in October 2023 for altered mental status.  Follows with neurology for Alzheimer's dementia which has been progressing.  He is minimally verbal and unable to participate in the conversation.  She does not want to see palliative care services at this time. For patient's sister, advanced directives are in place at [facility].  CODE STATUS is DNR.   Interval history Patient was seen today accompanied  by Daniel Knapp. Patient is nonverbal.  Information was obtained from sister.  Patient presented to ED on March 24 from [facility] after a syncopal episode and rubbing of eyes.  He has had similar episodes in the past.  He had workup done which was negative and he returned to his baseline and was discharged from the ED itself.  His appetite is good.  He is taking iron supplements every day.  Tolerating well.  Unknown if he is having any issues with bowel or bladder.  REVIEW OF SYSTEMS:   ROS  As per HPI. Otherwise, a complete review of systems is negative.  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-has seen Dr Delaine Lame   Prostate cancer 02/2016   Prostate enlargement    Urinary catheter insertion/adjustment/removal    Urinary retention    UTI (lower urinary tract infection)    UTI symptoms    Weight loss     PAST SURGICAL HISTORY: Past Surgical History:  Procedure Laterality Date   ESOPHAGOGASTRODUODENOSCOPY N/A 11/21/2021   Procedure: ESOPHAGOGASTRODUODENOSCOPY (EGD);  Surgeon: Annamaria Helling, DO;  Location: [facility] ENDOSCOPY;  Service: Gastroenterology;  Laterality: N/A;   IR ANGIOGRAM SELECTIVE EACH ADDITIONAL VESSEL  11/25/2021   IR EMBO ART  VEN HEMORR LYMPH EXTRAV  INC GUIDE ROADMAPPING  11/25/2021   IR TRANSHEPATIC  PORTOGRAM WO HEMO  11/25/2021   IR US GUIDE VASC ACCESS RIGHT  11/25/2021   TRANSURETHRAL RESECTION OF BLADDER TUMOR N/A 08/17/2015   Procedure: TRANSURETHRAL RESECTION OF BLADDER TUMOR (TURBT);  Surgeon: [name], MD;  Location: [facility] ORS;  Service: Urology;  Laterality: N/A;   TRANSURETHRAL RESECTION OF PROSTATE N/A 08/17/2015   Procedure: TRANSURETHRAL RESECTION OF THE PROSTATE (TURP);  Surgeon: [name], MD;  Location: [facility] ORS;  Service:  Urology;  Laterality: N/A;   XI ROBOTIC ASSISTED VENTRAL HERNIA N/A 09/30/2020   Procedure: XI ROBOTIC ASSISTED VENTRAL HERNIA;  Surgeon: Herbert Pun, MD;  Location: [facility] ORS;  Service: General;  Laterality: N/A;    FAMILY HISTORY: Family History  Problem Relation Age of Onset   Heart disease Mother    Cancer Father    Kidney disease Neg Hx    Prostate cancer Neg Hx     HEALTH MAINTENANCE: Social History   Tobacco Use   Smoking status: Never   Smokeless tobacco: Never  Vaping Use   Vaping Use: Never used  Substance Use Topics   Alcohol use: Not Currently    Alcohol/week: 0.0 standard drinks of alcohol   Drug use: Not Currently    Types: Marijuana    Comment: years ago     No Known Allergies  Current Outpatient Medications  Medication Sig Dispense Refill   divalproex (DEPAKOTE SPRINKLE) 125 MG capsule Take 125 mg by mouth 2 (two) times daily.     ferrous sulfate 325 (65 FE) MG tablet Take 325 mg by mouth daily.     finasteride (PROSCAR) 5 MG tablet Take 5 mg by mouth daily in the afternoon.     leptospermum manuka honey (MEDIHONEY) PSTE paste Apply 1 Application topically daily.     LORazepam (ATIVAN) 0.5 MG tablet Take 0.5 mg by mouth every 12 (twelve) hours.     Multiple Vitamin (MULTIVITAMIN WITH MINERALS) TABS tablet Take 1 tablet by mouth daily in the afternoon.     omeprazole (PRILOSEC) 40 MG capsule Take 40 mg by mouth 2 (two) times daily.     PARoxetine (PAXIL) 20 MG tablet Take 20 mg by mouth daily.     polyethylene glycol (MIRALAX / GLYCOLAX) 17 g packet Take 17 g by mouth 2 (two) times daily. Until stooling regularly/soft stool then can decrease to daily or every other day (Patient taking differently: Take 17 g by mouth 2 (two) times daily.) 30 packet 11   terazosin (HYTRIN) 1 MG capsule Take 1 mg by mouth daily.     vitamin B-12 (CYANOCOBALAMIN) 1000 MCG tablet Take 1,000 mcg by mouth daily in the afternoon.     Cholecalciferol 50 MCG (2000 UT) TABS  Take 2,000 Units by mouth daily.     potassium chloride SA (KLOR-CON M) 20 MEQ tablet Take 1 tablet (20 mEq total) by mouth 2 (two) times daily for 5 days. 10 tablet 0   No current facility-administered medications for this visit.    OBJECTIVE: Vitals:   09/13/22 1336  BP: 137/82  Pulse: 71  Resp: 16  Temp: (!) 94.3 F (34.6 C)  SpO2: 91%     Body mass index is 19.25 kg/m.      Physical exam not performed.  No specific concerns endorsed.  LAB RESULTS:  Lab Results  Component Value Date   NA 136 09/13/2022   K 3.5 09/13/2022   CL 102 09/13/2022   CO2 27 09/13/2022   GLUCOSE 142 (H) 09/13/2022  BUN 13 09/13/2022   CREATININE 0.81 09/13/2022   CALCIUM 9.2 09/13/2022   PROT 8.2 (H) 09/13/2022   ALBUMIN 4.0 09/13/2022   AST 44 (H) 09/13/2022   ALT 58 (H) 09/13/2022   ALKPHOS 73 09/13/2022   BILITOT 0.3 09/13/2022   GFRNONAA >60 09/13/2022   GFRAA >60 08/19/2015    Lab Results  Component Value Date   WBC 3.0 (L) 09/13/2022   NEUTROABS 2.0 09/13/2022   HGB 12.5 (L) 09/13/2022   HCT 39.0 09/13/2022   MCV 93.5 09/13/2022   PLT 127 (L) 09/13/2022    Lab Results  Component Value Date   TIBC 402 06/14/2022   TIBC 342 01/18/2022   TIBC 469 (H) 11/20/2021   FERRITIN 15 (L) 06/14/2022   FERRITIN 14 (L) 01/18/2022   FERRITIN 4 (L) 11/20/2021   IRONPCTSAT 11 (L) 06/14/2022   IRONPCTSAT 6 (L) 01/18/2022   IRONPCTSAT 2 (L) 11/20/2021     STUDIES: DG Chest Portable 1 View  Result Date: 09/02/2022 CLINICAL DATA:  72 year old male with shortness of breath. EXAM: PORTABLE CHEST 1 VIEW COMPARISON:  Chest radiographs 05/31/2022 and earlier. FINDINGS: Portable AP semi upright view at 1108 hours. Chronic tortuosity of the thoracic aorta, radiographic contour is stable. No cardiomegaly. Visualized tracheal air column is within normal limits. Lower lung volumes. Allowing for perihilar crowding of markings, Allowing for portable technique the lungs are clear. No pneumothorax  or pleural effusion. Mildly increased visible left upper quadrant bowel gas. No acute osseous abnormality identified. IMPRESSION: Lower lung volumes with no acute cardiopulmonary abnormality. Chronically tortuous thoracic aorta. Electronically Signed   By: Genevie Ann M.D.   On: 09/02/2022 11:14   CT HEAD WO CONTRAST (5MM)  Result Date: 09/02/2022 CLINICAL DATA:  72 year old male with altered mental status. History of prostate cancer. EXAM: CT HEAD WITHOUT CONTRAST TECHNIQUE: Contiguous axial images were obtained from the base of the skull through the vertex without intravenous contrast. RADIATION DOSE REDUCTION: This exam was performed according to the departmental dose-optimization program which includes automated exposure control, adjustment of the mA and/or kV according to patient size and/or use of iterative reconstruction technique. COMPARISON:  Head CT 05/31/2022 and earlier. FINDINGS: Brain: Stable cerebral volume. Suspicion of disproportionate anterior frontal lobe atrophy bilaterally (series 2, image 17), chronic. No definite superimposed cortical encephalomalacia in the cerebral hemispheres. Mesial temporal lobe volume also seems diminished on coronal image 36. No superimposed midline shift, ventriculomegaly, mass effect, evidence of mass lesion, intracranial hemorrhage or evidence of cortically based acute infarction. And otherwise fairly normal for age gray-white differentiation. Chronic inferior left cerebellar hemisphere blood products by MRI are occult on CT. Vascular: Calcified atherosclerosis at the skull base. No suspicious intracranial vascular hyperdensity. Skull: No acute or suspicious osseous lesion identified. Sinuses/Orbits: Visible paranasal sinuses and mastoids are hyperplastic, well aerated. Other: No acute orbit or scalp soft tissue finding. IMPRESSION: 1. No acute intracranial abnormality, but suspicion of disproportionate frontal lobe and mesial temporal brain atrophy. Consider  frontotemporal lobar degeneration (FTLD). Cerebral Atrophy (ICD10-G31.9). 2. Stable non contrast CT appearance of the brain. Chronic inferior left cerebellar blood products are occult by CT. Electronically Signed   By: Genevie Ann M.D.   On: 09/02/2022 11:00    ASSESSMENT AND PLAN:   Daniel Knapp is a 72 y.o. male from [facility] with past medical history of prostate cancer s/p EBRT in 2018, hypertension, anemia, Alzheimer's dementia, Hep C untreated was referred to hematology for pancytopenia.  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  Stable.  # Iron deficiency anemia -Hemoglobin has improved to 12.5.  Continue with ferrous sulfate 325 mg daily. -Repeat iron panel pending from today.  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 Unclear if received ADT. Lost to follow up with Urology. Now with PSA trending up concerning for recurrence. CT abdo from July 2023 showed large bladder mass, RP lymphadenopathy.   -We rediscussed goals of care and sister again confirmed that she does not want any treatment for the prostate cancer.  She declined hospice.  I will keep follow-up appointment in 6 months.  Sister knows that she can call me sooner if needed.  Currently patient is not in any pain.  #Weight loss - he has weight loss likely secondary to prostate cancer and dementia unable to maintain calorie intake.   #Liver cirrhosis #Hep C #GI bleed - He was admitted in July 2023 for GI bleed.  Had hemoglobin of 3.7 and required for blood transfusions.  He was then admitted in August 2023 for hematochezia which was thought to be from hemorrhoidal bleed.  Did not require any blood transfusions. 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  - Scheduled to see Gi at [facility] clinic in April 2024.   #Alzheimer's dementia - progressive,  follows with Dr. Manuella Ghazi    Orders Placed This Encounter  Procedures   CBC with Differential/Platelet   Comprehensive metabolic panel   PSA   Iron and TIBC   Ferritin   RTC in 6 months for MD visit, labs.  Patient expressed understanding and was in agreement with this plan. He also understands that He can call clinic at any time with any questions, concerns, or complaints.   I spent a total of 45 minutes reviewing chart data, face-to-face evaluation with the patient, counseling and coordination of care as detailed above.  Jane Canary, MD   09/13/2022 2:45 PM

## 2022-09-18 DIAGNOSIS — Z8719 Personal history of other diseases of the digestive system: Secondary | ICD-10-CM | POA: Diagnosis not present

## 2022-09-18 DIAGNOSIS — C61 Malignant neoplasm of prostate: Secondary | ICD-10-CM | POA: Diagnosis not present

## 2022-09-18 DIAGNOSIS — R55 Syncope and collapse: Secondary | ICD-10-CM | POA: Diagnosis not present

## 2022-09-18 DIAGNOSIS — I864 Gastric varices: Secondary | ICD-10-CM | POA: Diagnosis not present

## 2022-09-18 DIAGNOSIS — D61818 Other pancytopenia: Secondary | ICD-10-CM | POA: Diagnosis not present

## 2022-09-18 DIAGNOSIS — K7469 Other cirrhosis of liver: Secondary | ICD-10-CM | POA: Diagnosis not present

## 2022-09-18 DIAGNOSIS — G301 Alzheimer's disease with late onset: Secondary | ICD-10-CM | POA: Diagnosis not present

## 2022-09-18 DIAGNOSIS — R634 Abnormal weight loss: Secondary | ICD-10-CM | POA: Diagnosis not present

## 2022-09-18 DIAGNOSIS — D500 Iron deficiency anemia secondary to blood loss (chronic): Secondary | ICD-10-CM | POA: Diagnosis not present

## 2022-09-18 DIAGNOSIS — F02818 Dementia in other diseases classified elsewhere, unspecified severity, with other behavioral disturbance: Secondary | ICD-10-CM | POA: Diagnosis not present

## 2022-09-18 DIAGNOSIS — B1920 Unspecified viral hepatitis C without hepatic coma: Secondary | ICD-10-CM | POA: Diagnosis not present

## 2022-09-19 DIAGNOSIS — R945 Abnormal results of liver function studies: Secondary | ICD-10-CM | POA: Diagnosis not present

## 2022-10-01 DIAGNOSIS — F0390 Unspecified dementia without behavioral disturbance: Secondary | ICD-10-CM | POA: Diagnosis not present

## 2022-10-03 ENCOUNTER — Encounter: Payer: Self-pay | Admitting: Urology

## 2022-10-03 ENCOUNTER — Ambulatory Visit (INDEPENDENT_AMBULATORY_CARE_PROVIDER_SITE_OTHER): Payer: Medicare HMO | Admitting: Urology

## 2022-10-03 VITALS — Ht 72.0 in | Wt 137.0 lb

## 2022-10-03 DIAGNOSIS — C61 Malignant neoplasm of prostate: Secondary | ICD-10-CM

## 2022-10-03 NOTE — Progress Notes (Signed)
   10/03/2022 3:58 PM   Daniel Knapp 06-Dec-1950 [id number]  Reason for visit: Follow up suspected metastatic prostate cancer, bladder mass  HPI: He has a complex urologic history and was previously followed by Dr. Sherryl Barters.  Briefly prior to developing severe dementia, he underwent a TURP in 2017 for Foley dependent urinary retention and a small amount of prostate cancer was seen on that specimen.  This was followed up by a prostate biopsy which showed unfavorable intermediate risk disease(Gleason score 4+3=7) prostate cancer and he underwent treatment with radiation in 2018.  From chart review I do not see that he received any adjuvant ADT at that time.  Post treatment PSA nadir was 0.6 in November 2018.   He has a number of other comorbidities including cirrhosis as well as recent GI bleed requiring hospitalization.  I actually saw him and his family in July 2023 when he was hospitalized for a 4 mm ureteral stone and they opted for medical expulsive therapy, he also was suspected to have metastatic prostate cancer with PSA greater than 150 at that time, suspected recurrence of prostate cancer at the base of the bladder, and retroperitoneal lymphadenopathy.  His family including his POA and [name] opted for more of a palliative care/watchful waiting approach and deferred any treatments at that time.  They were referred for a possible bladder mass seen on recent CT, though this correlates with the suspected local recurrence of prostate cancer we have discussed previously.  History is obtained entirely from his sister today as he is minimally to nonverbal, she denies any major changes from a urology perspective over the last year aside from continued weight loss.  No gross hematuria, dysuria, or urinary complaints.  I reviewed the most recent imaging from 04/09/2022 showing a 3 cm mass at the base of the bladder most likely consistent with known recurrence locally of prostate cancer,  not a new bladder malignancy.  Previously have offered cystoscopy and other workup, but family has deferred with his severe dementia, cirrhosis, and other comorbidities.  Family would like to continue a more palliative approach and is not interested in further evaluation with cystoscopy, additional imaging testing, or treatment with ADT.  I think this is all very reasonable with his severe dementia, combativeness, cirrhosis, and absence of symptoms.  We are happy to see them back in the future for any development of new symptoms or if other concerns arise.  They also met with oncology recently and again deferred any invasive treatments or workup.  Follow-up with urology as needed  Sondra Come, MD  [facility] [address], [address] [place], [place] [address] [phone]

## 2022-10-23 DIAGNOSIS — K219 Gastro-esophageal reflux disease without esophagitis: Secondary | ICD-10-CM | POA: Diagnosis not present

## 2022-11-08 DIAGNOSIS — Z7289 Other problems related to lifestyle: Secondary | ICD-10-CM | POA: Diagnosis not present

## 2022-11-08 DIAGNOSIS — F0280 Dementia in other diseases classified elsewhere without behavioral disturbance: Secondary | ICD-10-CM | POA: Diagnosis not present

## 2022-11-08 DIAGNOSIS — N400 Enlarged prostate without lower urinary tract symptoms: Secondary | ICD-10-CM | POA: Diagnosis not present

## 2022-11-29 DIAGNOSIS — L603 Nail dystrophy: Secondary | ICD-10-CM | POA: Diagnosis not present

## 2022-11-29 DIAGNOSIS — I739 Peripheral vascular disease, unspecified: Secondary | ICD-10-CM | POA: Diagnosis not present

## 2022-12-03 DIAGNOSIS — R451 Restlessness and agitation: Secondary | ICD-10-CM | POA: Diagnosis not present

## 2022-12-14 DIAGNOSIS — F0280 Dementia in other diseases classified elsewhere without behavioral disturbance: Secondary | ICD-10-CM | POA: Diagnosis not present

## 2022-12-14 DIAGNOSIS — R634 Abnormal weight loss: Secondary | ICD-10-CM | POA: Diagnosis not present

## 2022-12-14 DIAGNOSIS — Z7189 Other specified counseling: Secondary | ICD-10-CM | POA: Diagnosis not present

## 2022-12-20 DIAGNOSIS — Z7289 Other problems related to lifestyle: Secondary | ICD-10-CM | POA: Diagnosis not present

## 2023-01-16 DIAGNOSIS — F0280 Dementia in other diseases classified elsewhere without behavioral disturbance: Secondary | ICD-10-CM | POA: Diagnosis not present

## 2023-01-16 DIAGNOSIS — Z7289 Other problems related to lifestyle: Secondary | ICD-10-CM | POA: Diagnosis not present

## 2023-01-16 DIAGNOSIS — D509 Iron deficiency anemia, unspecified: Secondary | ICD-10-CM | POA: Diagnosis not present

## 2023-01-16 DIAGNOSIS — R451 Restlessness and agitation: Secondary | ICD-10-CM | POA: Diagnosis not present

## 2023-01-16 DIAGNOSIS — N400 Enlarged prostate without lower urinary tract symptoms: Secondary | ICD-10-CM | POA: Diagnosis not present

## 2023-01-16 DIAGNOSIS — G309 Alzheimer's disease, unspecified: Secondary | ICD-10-CM | POA: Diagnosis not present

## 2023-01-17 DIAGNOSIS — F0390 Unspecified dementia without behavioral disturbance: Secondary | ICD-10-CM | POA: Diagnosis not present

## 2023-01-17 DIAGNOSIS — R451 Restlessness and agitation: Secondary | ICD-10-CM | POA: Diagnosis not present

## 2023-02-08 DIAGNOSIS — L603 Nail dystrophy: Secondary | ICD-10-CM | POA: Diagnosis not present

## 2023-02-08 DIAGNOSIS — L602 Onychogryphosis: Secondary | ICD-10-CM | POA: Diagnosis not present

## 2023-02-08 DIAGNOSIS — I739 Peripheral vascular disease, unspecified: Secondary | ICD-10-CM | POA: Diagnosis not present

## 2023-02-12 DIAGNOSIS — Z79899 Other long term (current) drug therapy: Secondary | ICD-10-CM | POA: Diagnosis not present

## 2023-02-18 DIAGNOSIS — R451 Restlessness and agitation: Secondary | ICD-10-CM | POA: Diagnosis not present

## 2023-02-18 DIAGNOSIS — F0390 Unspecified dementia without behavioral disturbance: Secondary | ICD-10-CM | POA: Diagnosis not present

## 2023-03-18 ENCOUNTER — Inpatient Hospital Stay: Payer: Medicare HMO | Attending: Internal Medicine

## 2023-03-18 ENCOUNTER — Inpatient Hospital Stay (HOSPITAL_BASED_OUTPATIENT_CLINIC_OR_DEPARTMENT_OTHER): Payer: Medicare HMO | Admitting: Internal Medicine

## 2023-03-18 VITALS — BP 86/47 | HR 86 | Temp 97.4°F

## 2023-03-18 DIAGNOSIS — C61 Malignant neoplasm of prostate: Secondary | ICD-10-CM | POA: Diagnosis not present

## 2023-03-18 DIAGNOSIS — F02C0 Dementia in other diseases classified elsewhere, severe, without behavioral disturbance, psychotic disturbance, mood disturbance, and anxiety: Secondary | ICD-10-CM | POA: Insufficient documentation

## 2023-03-18 DIAGNOSIS — Z8546 Personal history of malignant neoplasm of prostate: Secondary | ICD-10-CM | POA: Insufficient documentation

## 2023-03-18 DIAGNOSIS — Z7289 Other problems related to lifestyle: Secondary | ICD-10-CM | POA: Diagnosis not present

## 2023-03-18 DIAGNOSIS — R591 Generalized enlarged lymph nodes: Secondary | ICD-10-CM | POA: Insufficient documentation

## 2023-03-18 DIAGNOSIS — F0390 Unspecified dementia without behavioral disturbance: Secondary | ICD-10-CM | POA: Diagnosis not present

## 2023-03-18 DIAGNOSIS — D500 Iron deficiency anemia secondary to blood loss (chronic): Secondary | ICD-10-CM

## 2023-03-18 DIAGNOSIS — D509 Iron deficiency anemia, unspecified: Secondary | ICD-10-CM | POA: Diagnosis not present

## 2023-03-18 DIAGNOSIS — B1920 Unspecified viral hepatitis C without hepatic coma: Secondary | ICD-10-CM | POA: Diagnosis not present

## 2023-03-18 DIAGNOSIS — Z66 Do not resuscitate: Secondary | ICD-10-CM | POA: Insufficient documentation

## 2023-03-18 DIAGNOSIS — Z923 Personal history of irradiation: Secondary | ICD-10-CM | POA: Diagnosis not present

## 2023-03-18 DIAGNOSIS — D61818 Other pancytopenia: Secondary | ICD-10-CM | POA: Diagnosis not present

## 2023-03-18 DIAGNOSIS — N400 Enlarged prostate without lower urinary tract symptoms: Secondary | ICD-10-CM | POA: Diagnosis not present

## 2023-03-18 DIAGNOSIS — K7460 Unspecified cirrhosis of liver: Secondary | ICD-10-CM | POA: Diagnosis not present

## 2023-03-18 LAB — IRON AND TIBC
Iron: 113 ug/dL (ref 45–182)
Saturation Ratios: 29 % (ref 17.9–39.5)
TIBC: 393 ug/dL (ref 250–450)
UIBC: 280 ug/dL

## 2023-03-18 LAB — COMPREHENSIVE METABOLIC PANEL
ALT: 40 U/L (ref 0–44)
AST: 25 U/L (ref 15–41)
Albumin: 3.2 g/dL — ABNORMAL LOW (ref 3.5–5.0)
Alkaline Phosphatase: 83 U/L (ref 38–126)
Anion gap: 5 (ref 5–15)
BUN: 10 mg/dL (ref 8–23)
CO2: 31 mmol/L (ref 22–32)
Calcium: 9 mg/dL (ref 8.9–10.3)
Chloride: 107 mmol/L (ref 98–111)
Creatinine, Ser: 0.87 mg/dL (ref 0.61–1.24)
GFR, Estimated: >60 mL/min (ref >60)
Glucose, Bld: 93 mg/dL (ref 70–99)
Potassium: 3.4 mmol/L — ABNORMAL LOW (ref 3.5–5.1)
Sodium: 143 mmol/L (ref 135–145)
Total Bilirubin: 0.5 mg/dL (ref 0.3–1.2)
Total Protein: 7.6 g/dL (ref 6.5–8.1)

## 2023-03-18 LAB — CBC WITH DIFFERENTIAL/PLATELET
Abs Immature Granulocytes: 0.02 10*3/uL (ref 0.00–0.07)
Basophils Absolute: 0 10*3/uL (ref 0.0–0.1)
Basophils Relative: 1 %
Eosinophils Absolute: 0.1 10*3/uL (ref 0.0–0.5)
Eosinophils Relative: 2 %
HCT: 30.5 % — ABNORMAL LOW (ref 39.0–52.0)
Hemoglobin: 9.4 g/dL — ABNORMAL LOW (ref 13.0–17.0)
Immature Granulocytes: 1 %
Lymphocytes Relative: 28 %
Lymphs Abs: 1.1 10*3/uL (ref 0.7–4.0)
MCH: 29.7 pg (ref 26.0–34.0)
MCHC: 30.8 g/dL (ref 30.0–36.0)
MCV: 96.2 fL (ref 80.0–100.0)
Monocytes Absolute: 0.2 10*3/uL (ref 0.1–1.0)
Monocytes Relative: 5 %
Neutro Abs: 2.6 10*3/uL (ref 1.7–7.7)
Neutrophils Relative %: 63 %
Platelets: 227 10*3/uL (ref 150–400)
RBC: 3.17 MIL/uL — ABNORMAL LOW (ref 4.22–5.81)
RDW: 13.2 % (ref 11.5–15.5)
WBC: 4 10*3/uL (ref 4.0–10.5)
nRBC: 0 % (ref 0.0–0.2)

## 2023-03-18 LAB — PSA: Prostatic Specific Antigen: 202.96 ng/mL — ABNORMAL HIGH (ref 0.00–4.00)

## 2023-03-18 LAB — FERRITIN: Ferritin: 10 ng/mL — ABNORMAL LOW (ref 24–336)

## 2023-03-18 NOTE — Progress Notes (Signed)
Patients sister is with him today , and she wants to talk with Dr. Alena Bills in regards to whether or not if him coming here is doing anything.

## 2023-03-18 NOTE — Progress Notes (Signed)
[facility]  [phone]) [phone] Fax:(336) [phone]  ID: Daniel Knapp: 06/06/1951  MR#: [id number]  NGE#:[id number]  Patient Care Team: Kandyce Rud, MD as PCP - General (Family Medicine) Michaelyn Barter, MD as Consulting Physician (Oncology)  HPI: Daniel Knapp is a 72 y.o. male from [facility] with past medical history of prostate cancer s/p EBRT in 2018, hypertension, anemia, Alzheimer's dementia was referred to hematology for pancytopenia.  Patient has severe dementia, baseline confusion, minimally verbal.  [name] is POA.  Patient has complex medical history and multiple issues going on. Patient was seen by Urology in July 2023.  He had TURP in 2017 with small amount of prostate cancer seen on that specimen.  For the prostate biopsy showed Gleason score 4+3 equal to 7 unfavorable intermediate risk status post radiation in 2018.  Posttreatment PSA 0.6.  Lost to follow-up with urology after that.  CT scan from July 2023 showed 4.1 cm soft tissue mass in the bladder, mild right-sided hydronephrosis and hydroureter. PSA trending up since July 2022.  PSA from 01/03/2022 100.8  From 10/20/2021 greater than 150.  She has decided with no invasive measures and any treatment for the prostate cancer.  Patient has had multiple admissions. He was admitted in July 2023 for GI bleed.  He had hemoglobin of 3.7 and required for blood transfusions.  He was then admitted in August 2023 for hematochezia which was thought to be from hemorrhoidal bleed.  Did not require any blood transfusions. Then admitted again in October 2023 for altered mental status.  Follows with neurology for Alzheimer's dementia which has been progressing.  He is minimally verbal and unable to participate in the conversation.  She does not want to see palliative care services at this time. For patient's sister, advanced directives are in place at [facility]  CODE STATUS is DNR.    Interval history Patient was seen today accompanied by America Brown. Patient is nonverbal.  Information was obtained from sister.  Patient was evaluated by GI and is deemed too high risk for any procedures.  Denies any new changes or symptoms.   REVIEW OF SYSTEMS:   Review of Systems  Unable to perform ROS: Dementia   As per HPI. Otherwise, a complete review of systems is negative.  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-has seen Dr Rivka Safer   Prostate cancer [facility]) 02/2016   Prostate enlargement    Urinary catheter insertion/adjustment/removal    Urinary retention    UTI (lower urinary tract infection)    UTI symptoms    Weight loss     PAST SURGICAL HISTORY: Past Surgical History:  Procedure Laterality Date   ESOPHAGOGASTRODUODENOSCOPY N/A 11/21/2021   Procedure: ESOPHAGOGASTRODUODENOSCOPY (EGD);  Surgeon: Jaynie Collins, DO;  Location: [facility] ENDOSCOPY;  Service: Gastroenterology;  Laterality: N/A;   IR ANGIOGRAM SELECTIVE EACH ADDITIONAL VESSEL  11/25/2021   IR EMBO ART  VEN HEMORR LYMPH EXTRAV  INC GUIDE ROADMAPPING  11/25/2021   IR TRANSHEPATIC PORTOGRAM WO HEMO  11/25/2021   IR US GUIDE VASC ACCESS RIGHT  11/25/2021   TRANSURETHRAL RESECTION OF BLADDER TUMOR N/A 08/17/2015   Procedure: TRANSURETHRAL RESECTION OF BLADDER TUMOR (TURBT);  Surgeon: Cloyde Reams  Sherryl Barters, MD;  Location: [facility] ORS;  Service: Urology;  Laterality: N/A;   TRANSURETHRAL RESECTION OF PROSTATE N/A 08/17/2015   Procedure: TRANSURETHRAL RESECTION OF THE PROSTATE (TURP);  Surgeon: Hildred Laser, MD;  Location: [facility] ORS;  Service: Urology;  Laterality: N/A;   XI ROBOTIC ASSISTED VENTRAL HERNIA N/A 09/30/2020   Procedure: XI ROBOTIC ASSISTED VENTRAL HERNIA;  Surgeon: Carolan Shiver, MD;   Location: [facility] ORS;  Service: General;  Laterality: N/A;    FAMILY HISTORY: Family History  Problem Relation Age of Onset   Heart disease Mother    Cancer Father    Kidney disease Neg Hx    Prostate cancer Neg Hx     HEALTH MAINTENANCE: Social History   Tobacco Use   Smoking status: Never    Passive exposure: Never   Smokeless tobacco: Never  Vaping Use   Vaping status: Never Used  Substance Use Topics   Alcohol use: Not Currently    Alcohol/week: 0.0 standard drinks of alcohol   Drug use: Not Currently    Types: Marijuana    Comment: years ago     No Known Allergies  Current Outpatient Medications  Medication Sig Dispense Refill   Cholecalciferol 50 MCG (2000 UT) TABS Take 2,000 Units by mouth daily.     divalproex (DEPAKOTE SPRINKLE) 125 MG capsule Take 125 mg by mouth 2 (two) times daily.     ferrous sulfate 325 (65 FE) MG tablet Take 325 mg by mouth daily.     finasteride (PROSCAR) 5 MG tablet Take 5 mg by mouth daily in the afternoon.     LORazepam (ATIVAN) 0.5 MG tablet Take 0.5 mg by mouth every 12 (twelve) hours.     Multiple Vitamin (MULTIVITAMIN WITH MINERALS) TABS tablet Take 1 tablet by mouth daily in the afternoon.     omeprazole (PRILOSEC) 40 MG capsule Take 40 mg by mouth 2 (two) times daily.     PARoxetine (PAXIL) 20 MG tablet Take 20 mg by mouth daily.     polyethylene glycol (MIRALAX / GLYCOLAX) 17 g packet Take 17 g by mouth 2 (two) times daily. Until stooling regularly/soft stool then can decrease to daily or every other day 30 packet 11   terazosin (HYTRIN) 1 MG capsule Take 1 mg by mouth daily.     vitamin B-12 (CYANOCOBALAMIN) 1000 MCG tablet Take 1,000 mcg by mouth daily in the afternoon.     No current facility-administered medications for this visit.    OBJECTIVE: Vitals:   03/18/23 1047  BP: (!) 86/47  Pulse: 86  Temp: (!) 97.4 F (36.3 C)  SpO2: 98%     There is no height or weight on file to calculate BMI.      Physical exam not  performed.  No specific concerns endorsed.  LAB RESULTS:  Lab Results  Component Value Date   NA 143 03/18/2023   K 3.4 (L) 03/18/2023   CL 107 03/18/2023   CO2 31 03/18/2023   GLUCOSE 93 03/18/2023   BUN 10 03/18/2023   CREATININE 0.87 03/18/2023   CALCIUM 9.0 03/18/2023   PROT 7.6 03/18/2023   ALBUMIN 3.2 (L) 03/18/2023   AST 25 03/18/2023   ALT 40 03/18/2023   ALKPHOS 83 03/18/2023   BILITOT 0.5 03/18/2023   GFRNONAA >60 03/18/2023   GFRAA >60 08/19/2015    Lab Results  Component Value Date   WBC 4.0 03/18/2023   NEUTROABS 2.6 03/18/2023   HGB 9.4 (L) 03/18/2023   HCT 30.5 (  L) 03/18/2023   MCV 96.2 03/18/2023   PLT 227 03/18/2023    Lab Results  Component Value Date   TIBC 393 03/18/2023   TIBC 370 09/13/2022   TIBC 402 06/14/2022   FERRITIN 10 (L) 03/18/2023   FERRITIN 31 09/13/2022   FERRITIN 15 (L) 06/14/2022   IRONPCTSAT 29 03/18/2023   IRONPCTSAT 14 (L) 09/13/2022   IRONPCTSAT 11 (L) 06/14/2022     STUDIES: No results found.  ASSESSMENT AND PLAN:   RAYGAN CHINCHILLA is a 72 y.o. male from [facility] with past medical history of prostate cancer s/p EBRT in 2018, hypertension, anemia, Alzheimer's dementia, Hep C untreated was referred to hematology for pancytopenia.  # Pancytopenia -Leukopenia and thrombocytopenia likely related to liver cirrhosis and Hep C.  Stable.  # Iron deficiency anemia -Hemoglobin declined to 9.4 again.  Mild iron deficiency.  Continue with oral iron daily. -Will hold off on iron infusions with severe dementia and worsening prostate cancer. -Was evaluated by GI and deemed high risk for any invasive procedure.  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 Unclear if received ADT. Lost to follow up with Urology. Now with PSA trending up concerning for recurrence. CT abdo from July 2023 showed large bladder mass, RP  lymphadenopathy.   -PSA is rising.  Sister has declined any treatment.  I recommended hospice again.  Patient is involved with palliative care at [facility].  Continue with the communication.  She has declined palliative care referral with our office.  Patient can continue to follow-up with primary care.  Considering, patient has multiple medical issues and is difficult for sister to bring him in for multiple appointments will hold off follow-up with our office.  They have our clinic information and can call us if any questions arises.  #Weight loss - he has weight loss likely secondary to prostate cancer and dementia unable to maintain calorie intake.   #Liver cirrhosis #Hep C #GI bleed - He was admitted in July 2023 for GI bleed.  Had hemoglobin of 3.7 and required for blood transfusions.  He was then admitted in August 2023 for hematochezia which was thought to be from hemorrhoidal bleed.  Did not require any blood transfusions. 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  - Scheduled to see Gi at [facility] clinic in April 2024.   #Alzheimer's dementia - progressive, follows with Dr. Sherryll Burger    No orders of the defined types were placed in this encounter.  RTC as needed.  Patient expressed understanding and was in agreement with this plan. He also understands that He can call clinic at any time with any questions, concerns, or complaints.   I spent a total of 30 minutes reviewing chart data, face-to-face evaluation with the patient, counseling and coordination of care as detailed above.  Michaelyn Barter, MD   03/18/2023 12:12 PM

## 2023-03-20 DIAGNOSIS — I10 Essential (primary) hypertension: Secondary | ICD-10-CM | POA: Diagnosis not present

## 2023-03-20 DIAGNOSIS — E559 Vitamin D deficiency, unspecified: Secondary | ICD-10-CM | POA: Diagnosis not present

## 2023-03-20 DIAGNOSIS — D509 Iron deficiency anemia, unspecified: Secondary | ICD-10-CM | POA: Diagnosis not present

## 2023-03-20 DIAGNOSIS — F0390 Unspecified dementia without behavioral disturbance: Secondary | ICD-10-CM | POA: Diagnosis not present

## 2023-03-21 DIAGNOSIS — Z23 Encounter for immunization: Secondary | ICD-10-CM | POA: Diagnosis not present

## 2023-03-25 DIAGNOSIS — E559 Vitamin D deficiency, unspecified: Secondary | ICD-10-CM | POA: Diagnosis not present

## 2023-03-25 DIAGNOSIS — E539 Vitamin B deficiency, unspecified: Secondary | ICD-10-CM | POA: Diagnosis not present

## 2023-03-25 DIAGNOSIS — D509 Iron deficiency anemia, unspecified: Secondary | ICD-10-CM | POA: Diagnosis not present

## 2023-04-04 DIAGNOSIS — Z23 Encounter for immunization: Secondary | ICD-10-CM | POA: Diagnosis not present

## 2023-04-08 DIAGNOSIS — D649 Anemia, unspecified: Secondary | ICD-10-CM | POA: Diagnosis not present

## 2023-04-20 DIAGNOSIS — Z79899 Other long term (current) drug therapy: Secondary | ICD-10-CM | POA: Diagnosis not present

## 2023-05-15 DIAGNOSIS — Z7289 Other problems related to lifestyle: Secondary | ICD-10-CM | POA: Diagnosis not present

## 2023-05-15 DIAGNOSIS — E559 Vitamin D deficiency, unspecified: Secondary | ICD-10-CM | POA: Diagnosis not present

## 2023-05-15 DIAGNOSIS — F0390 Unspecified dementia without behavioral disturbance: Secondary | ICD-10-CM | POA: Diagnosis not present

## 2023-05-15 DIAGNOSIS — D509 Iron deficiency anemia, unspecified: Secondary | ICD-10-CM | POA: Diagnosis not present

## 2023-05-15 DIAGNOSIS — N400 Enlarged prostate without lower urinary tract symptoms: Secondary | ICD-10-CM | POA: Diagnosis not present

## 2023-05-27 DIAGNOSIS — L603 Nail dystrophy: Secondary | ICD-10-CM | POA: Diagnosis not present

## 2023-05-27 DIAGNOSIS — L84 Corns and callosities: Secondary | ICD-10-CM | POA: Diagnosis not present

## 2023-05-27 DIAGNOSIS — L602 Onychogryphosis: Secondary | ICD-10-CM | POA: Diagnosis not present

## 2023-05-27 DIAGNOSIS — I739 Peripheral vascular disease, unspecified: Secondary | ICD-10-CM | POA: Diagnosis not present

## 2023-08-14 ENCOUNTER — Emergency Department
Admission: EM | Admit: 2023-08-14 | Discharge: 2023-08-14 | Disposition: A | Discharge status: Home / Self Care | Attending: Emergency Medicine | Admitting: Emergency Medicine

## 2023-08-14 ENCOUNTER — Emergency Department

## 2023-08-14 ENCOUNTER — Other Ambulatory Visit: Payer: Self-pay

## 2023-08-14 DIAGNOSIS — Z8546 Personal history of malignant neoplasm of prostate: Secondary | ICD-10-CM | POA: Insufficient documentation

## 2023-08-14 DIAGNOSIS — R188 Other ascites: Secondary | ICD-10-CM | POA: Insufficient documentation

## 2023-08-14 DIAGNOSIS — D649 Anemia, unspecified: Secondary | ICD-10-CM | POA: Diagnosis not present

## 2023-08-14 LAB — COMPREHENSIVE METABOLIC PANEL
ALT: 20 U/L (ref 0–44)
AST: 29 U/L (ref 15–41)
Albumin: 2.3 g/dL — ABNORMAL LOW (ref 3.5–5.0)
Alkaline Phosphatase: 99 U/L (ref 38–126)
Anion gap: 6 (ref 5–15)
BUN: 29 mg/dL — ABNORMAL HIGH (ref 8–23)
CO2: 26 mmol/L (ref 22–32)
Calcium: 11.2 mg/dL — ABNORMAL HIGH (ref 8.9–10.3)
Chloride: 112 mmol/L — ABNORMAL HIGH (ref 98–111)
Creatinine, Ser: 1.1 mg/dL (ref 0.61–1.24)
GFR, Estimated: >60 mL/min (ref >60)
Glucose, Bld: 115 mg/dL — ABNORMAL HIGH (ref 70–99)
Potassium: 3.9 mmol/L (ref 3.5–5.1)
Sodium: 144 mmol/L (ref 135–145)
Total Bilirubin: 0.5 mg/dL (ref 0.0–1.2)
Total Protein: 7.2 g/dL (ref 6.5–8.1)

## 2023-08-14 LAB — CBC WITH DIFFERENTIAL/PLATELET
Abs Immature Granulocytes: 0.03 10*3/uL (ref 0.00–0.07)
Basophils Absolute: 0 10*3/uL (ref 0.0–0.1)
Basophils Relative: 0 %
Eosinophils Absolute: 0 10*3/uL (ref 0.0–0.5)
Eosinophils Relative: 0 %
HCT: 27.9 % — ABNORMAL LOW (ref 39.0–52.0)
Hemoglobin: 8.1 g/dL — ABNORMAL LOW (ref 13.0–17.0)
Immature Granulocytes: 0 %
Lymphocytes Relative: 13 %
Lymphs Abs: 0.9 10*3/uL (ref 0.7–4.0)
MCH: 24.8 pg — ABNORMAL LOW (ref 26.0–34.0)
MCHC: 29 g/dL — ABNORMAL LOW (ref 30.0–36.0)
MCV: 85.6 fL (ref 80.0–100.0)
Monocytes Absolute: 0.4 10*3/uL (ref 0.1–1.0)
Monocytes Relative: 5 %
Neutro Abs: 5.9 10*3/uL (ref 1.7–7.7)
Neutrophils Relative %: 82 %
Platelets: 224 10*3/uL (ref 150–400)
RBC: 3.26 MIL/uL — ABNORMAL LOW (ref 4.22–5.81)
RDW: 16.6 % — ABNORMAL HIGH (ref 11.5–15.5)
WBC: 7.2 10*3/uL (ref 4.0–10.5)
nRBC: 0 % (ref 0.0–0.2)

## 2023-08-14 LAB — LIPASE, BLOOD: Lipase: 37 U/L (ref 11–51)

## 2023-08-14 NOTE — ED Notes (Signed)
 ED Provider at bedside.

## 2023-08-14 NOTE — ED Notes (Signed)
 Patient cleaned up. New brief placed. Warm blankets provided. Bed low and locked. Call bell in reach.

## 2023-08-14 NOTE — ED Triage Notes (Signed)
 Pt here via [facility] from [facility] with abd distention. Pt has a current ileus with abd distention with a hx of prostate cancer. Pt also has a chronic GI bleed x2 years. Pt is non verbal at baseline.   99% RA 70 8.1 142/72

## 2023-08-14 NOTE — ED Provider Notes (Signed)
 [facility] Provider Note    Event Date/Time   First MD Initiated Contact with Patient 08/14/23 1514     (approximate)   History   Abdominal distention   HPI  Daniel Knapp is a 73 y.o. male  who presents to the emergency department today because of concern for abdominal distention and possible ileus seen on abdominal x-ray. Patient is coming from a living facility and is non verbal at baseline. Per EMS report the patient's abdomen was noted to be more distended than normal. They got a KUB which was concerning for distention and possible ileus so patient was transported here.     Physical Exam   Triage Vital Signs: ED Triage Vitals  Encounter Vitals Group     BP 08/14/23 1511 100/70     Systolic BP Percentile --      Diastolic BP Percentile --      Pulse Rate 08/14/23 1511 90     Resp 08/14/23 1511 16     Temp 08/14/23 1511 99.1 F (37.3 C)     Temp Source 08/14/23 1511 Oral     SpO2 08/14/23 1511 98 %     Weight 08/14/23 1513 136 lb 14.5 oz (62.1 kg)     Height 08/14/23 1513 6' (1.829 m)     Head Circumference --      Peak Flow --      Pain Score 08/14/23 1512 0     Pain Loc --      Pain Education --      Exclude from Growth Chart --     Most recent vital signs: Vitals:   08/14/23 1511 08/14/23 1600  BP: 100/70 104/76  Pulse: 90 91  Resp: 16 18  Temp: 99.1 F (37.3 C)   SpO2: 98% 100%   General: Awake, non verbal CV:  Good peripheral perfusion.  Resp:  Normal effort.  Abd:  Slight distention, no tenderness. Skin:  Sacral wound. No pus. No erythema.   ED Results / Procedures / Treatments   Labs (all labs ordered are listed, but only abnormal results are displayed) Labs Reviewed  CBC WITH DIFFERENTIAL/PLATELET - Abnormal; Notable for the following components:      Result Value   RBC 3.26 (*)    Hemoglobin 8.1 (*)    HCT 27.9 (*)    MCH 24.8 (*)    MCHC 29.0 (*)    RDW 16.6 (*)    All other components within normal  limits  COMPREHENSIVE METABOLIC PANEL - Abnormal; Notable for the following components:   Chloride 112 (*)    Glucose, Bld 115 (*)    BUN 29 (*)    Calcium 11.2 (*)    Albumin 2.3 (*)    All other components within normal limits  LIPASE, BLOOD     EKG  None   RADIOLOGY I independently interpreted and visualized the CT abd/pel. My interpretation: Ascites Radiology interpretation:  IMPRESSION:  1. Cirrhosis with large volume low-density abdominopelvic ascites.  2. Similar mass in the prostatectomy bed extending into the anterior  bladder.  3. Progression of metastatic prostate cancer with new hepatic,  peritoneal, and osseous metastases.  4. Indeterminate lobulated mass in the anterior spleen measuring 8.2  x 6.7 cm containing some internal calcification. This is  incompletely evaluated without IV contrast. This has increased in  size compared to 04/09/2022 and is in the same area as the  geographic hypoattenuation seen on CT 12/11/2021. This may represent  a metastasis.  5. Grossly similar retroperitoneal adenopathy.  6. Consider further evaluation of these findings with CT abdomen and  pelvis with IV contrast or PET/CT.  7. Small left pleural effusion and associated airspace opacities,  atelectasis versus pneumonia.  8. Multiple stones throughout the collecting systems in both kidneys  including in the left renal pelvis. No hydronephrosis.  9. Aortic Atherosclerosis (ICD10-I70.0).     PROCEDURES:  Critical Care performed: No   MEDICATIONS ORDERED IN ED: Medications - No data to display   IMPRESSION / MDM / ASSESSMENT AND PLAN / ED COURSE  I reviewed the triage vital signs and the nursing notes.                              Differential diagnosis includes, but is not limited to, ileus, normal gas pattern, SBO, mass  Patient's presentation is most consistent with acute presentation with potential threat to life or bodily function.   The patient is on the  cardiac monitor to evaluate for evidence of arrhythmia and/or significant heart rate changes.  Patient presented to the emergency department today because of concerns for possible ileus seen on x-ray performed at living facility.  Apparently they were concerned about increased abdominal distention. There was some question about patient's MOST form so was able to contact patient's wife. She would like patient to be evaluated. Will check blood work, get CT abd/pel.  Blood work does show anemia however patient has history of anemia. CT scan with findings consistent with ascites and cancer. Discussed with patient's sister, at this time do not feel patient requires emergent pericentesis given lack of abdominal tenderness or leukocytosis. Did raise question of pneumonia but patient without any respiratory distress, at this time I do think likely atelectasis. Will plan on discharging back to facility.\      FINAL CLINICAL IMPRESSION(S) / ED DIAGNOSES   Final diagnoses:  Other ascites      Note:  This document was prepared using Dragon voice recognition software and may include unintentional dictation errors.    [name], MD 08/14/23 2027

## 2023-08-14 NOTE — Discharge Instructions (Addendum)
 As we discussed the abdominal distention is due to fluid in the stomach. This is not uncommon for patient's with cirrhosis. Additionally there were multiple lesions in the abdomen concerning for cancer, which can followed by his primary providers as desired.

## 2023-08-14 NOTE — ED Notes (Signed)
 Called to [facility] for pt transport to facility [facility]) @844pm  per RN Caroline/Rep Victorino Dike .

## 2023-09-10 DEATH — deceased

## 2023-11-05 IMAGING — US US ABDOMEN LIMITED
1 series · 9 of 9 positions shown · non-contrast
Comparison: None Available.

CLINICAL DATA: Ascites, cirrhosis.

EXAM:
LIMITED ABDOMEN ULTRASOUND FOR ASCITES
TECHNIQUE: Limited ultrasound survey for ascites was performed in all four
abdominal quadrants.

[Series 1: us abdomen limited · 9 of 9 slices shown]
[im 1/9]
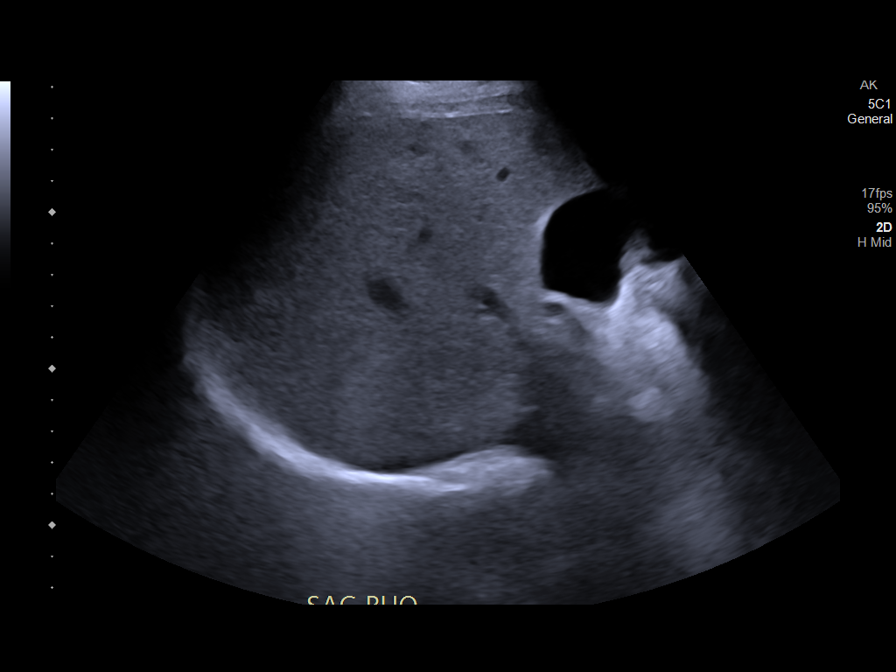
[im 2/9]
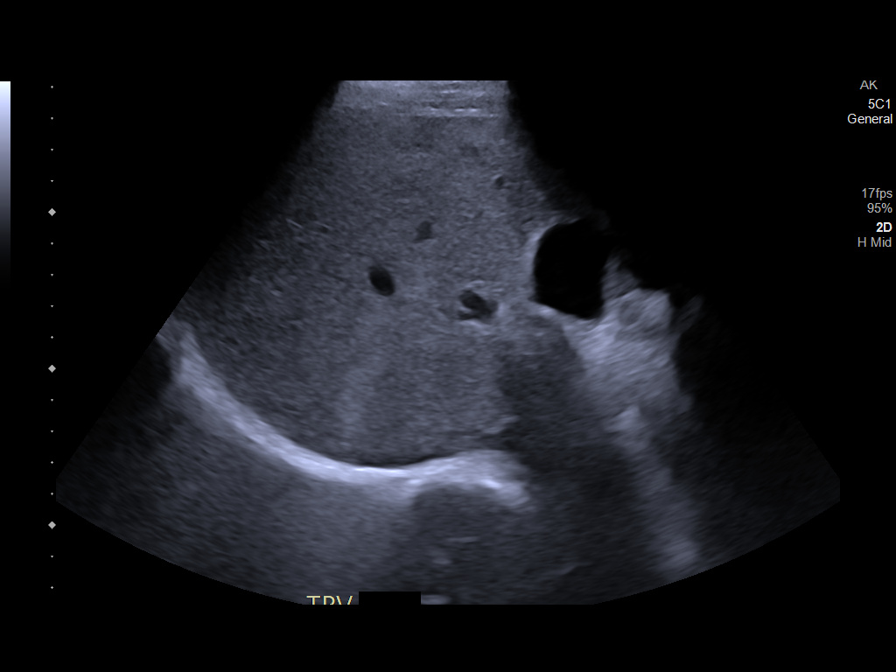
[im 3/9]
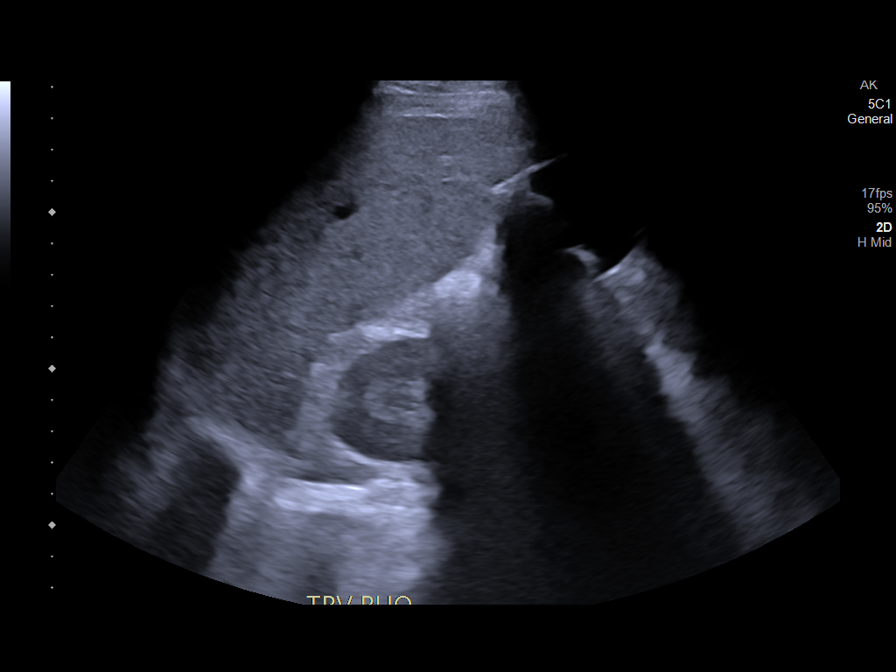
[im 4/9]
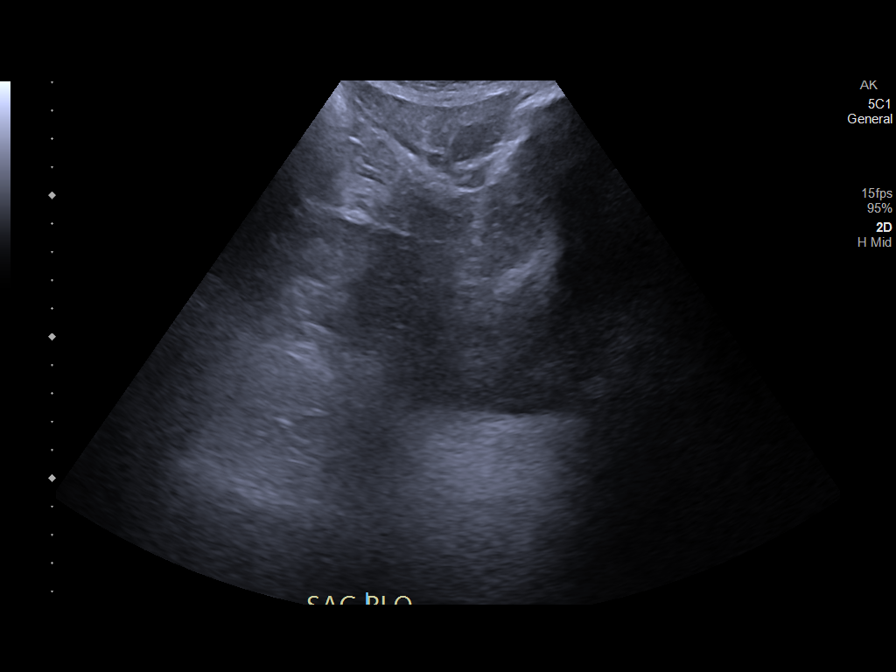
[im 5/9]
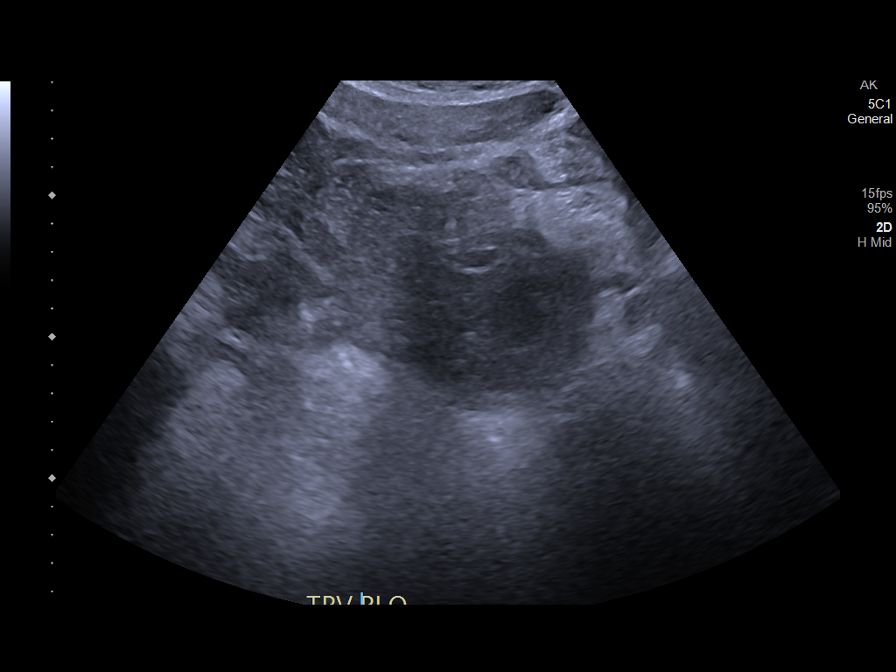
[im 6/9]
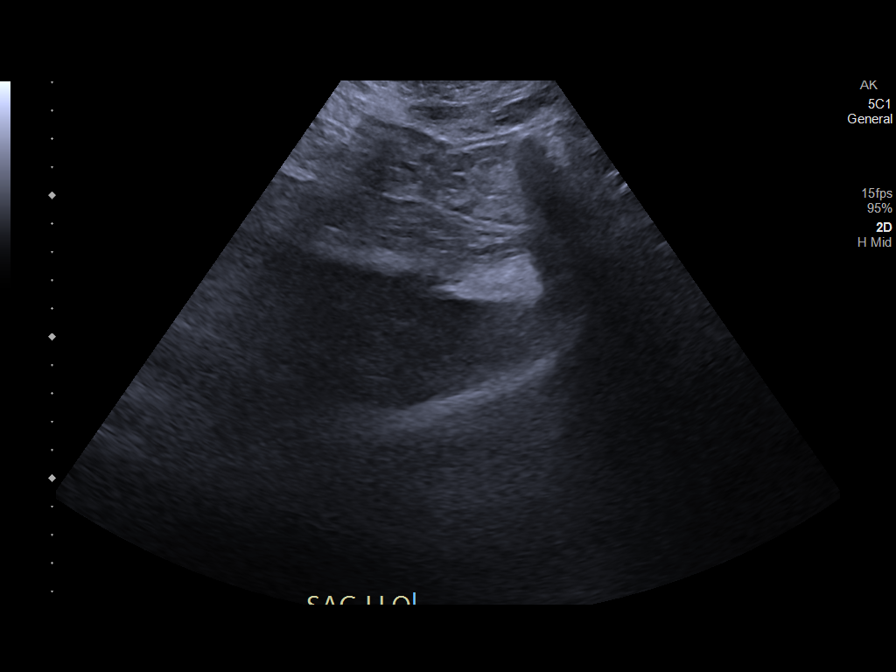
[im 7/9]
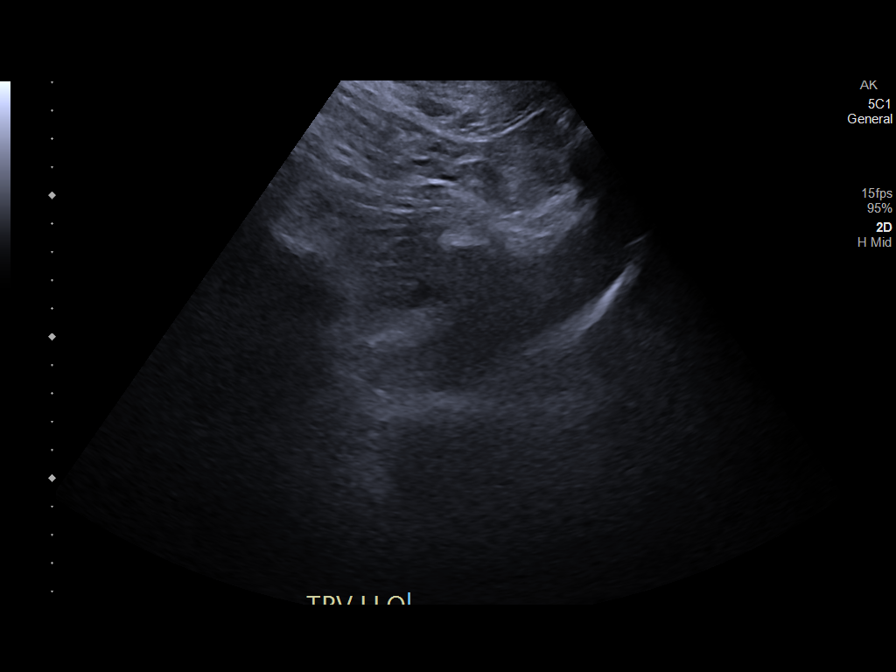
[im 8/9]
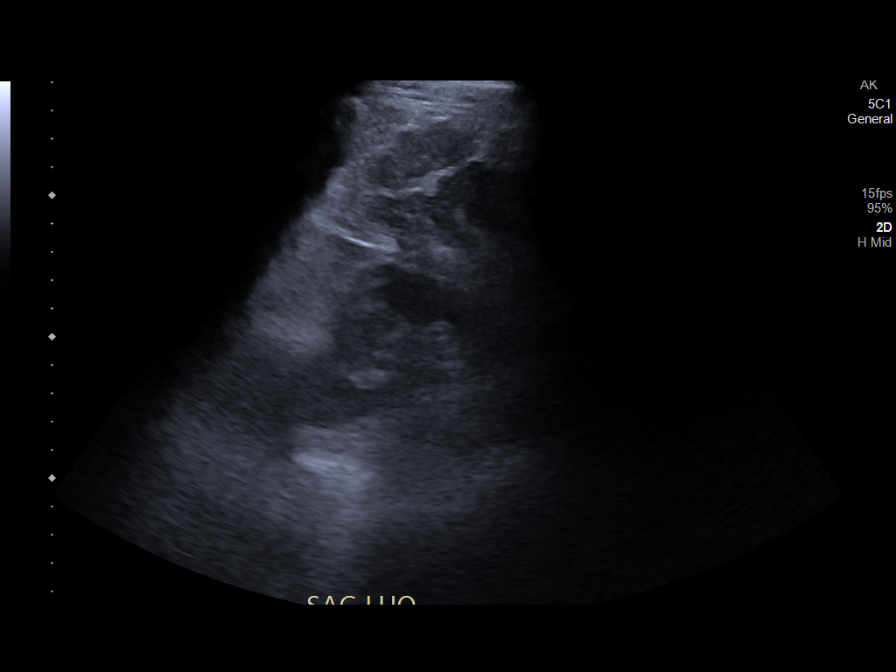
[im 9/9]
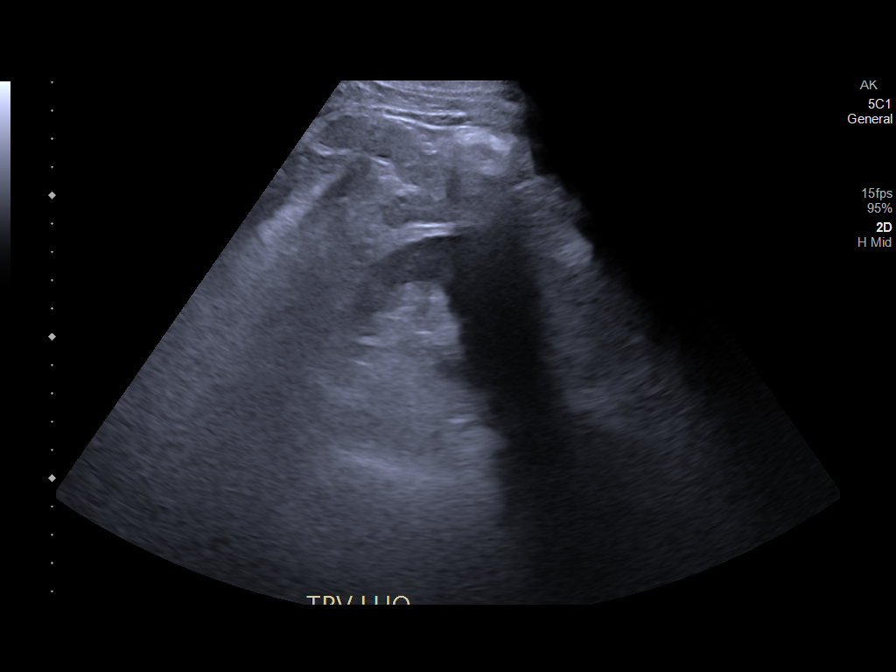

[9 of 9 positions shown; findings below may reference images not displayed]

FINDINGS: Four-quadrant ultrasound demonstrates no significant abdominal
ascites.
IMPRESSION: No significant abdominal ascites.

## 2023-11-05 IMAGING — CT CT HEAD W/O CM
4 series · 16 of 47 positions shown, 18 images · non-contrast
Comparison: MRI head April 16, 2018.

CLINICAL DATA: Altered mental status, nontraumatic (Ped 0-17y)



[Series 2: head wo · axial · 0.48mm/px · z∈[-158,-48]mm · 7 of 30 slices shown, 9 images]
[im 4/30  brain]
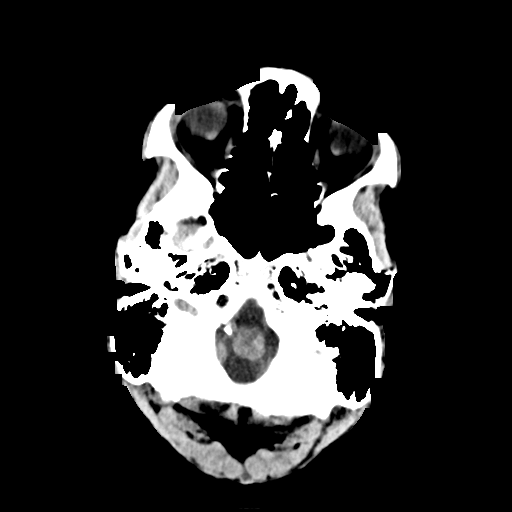
[im 4/30  bone]
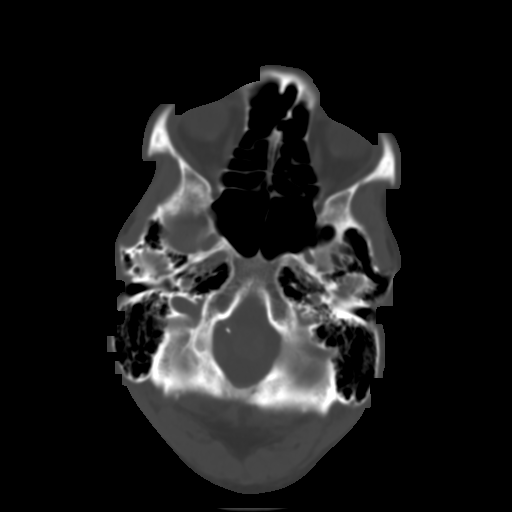
[im 8/30  brain]
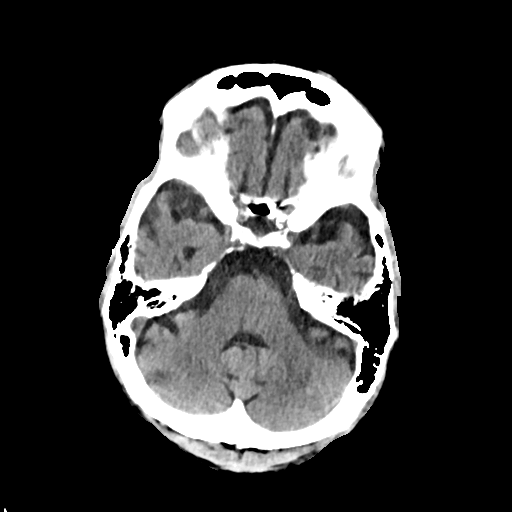
[im 11/30  brain]
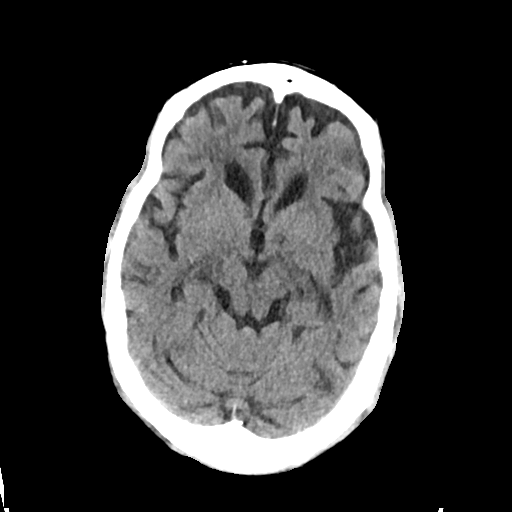
[im 15/30  brain]
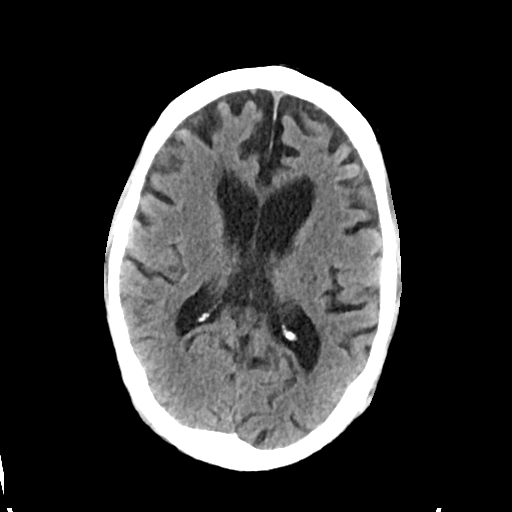
[im 19/30  brain]
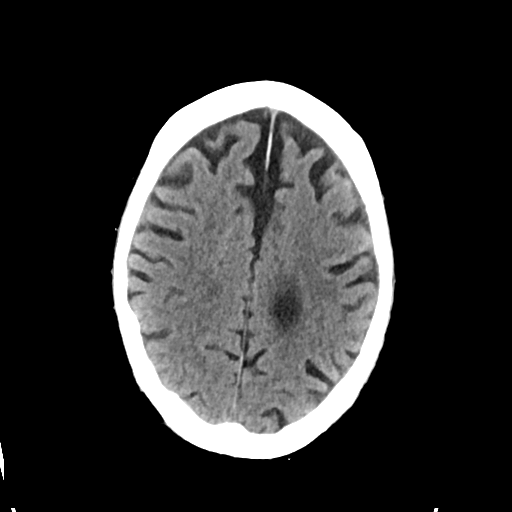
[im 19/30  bone]
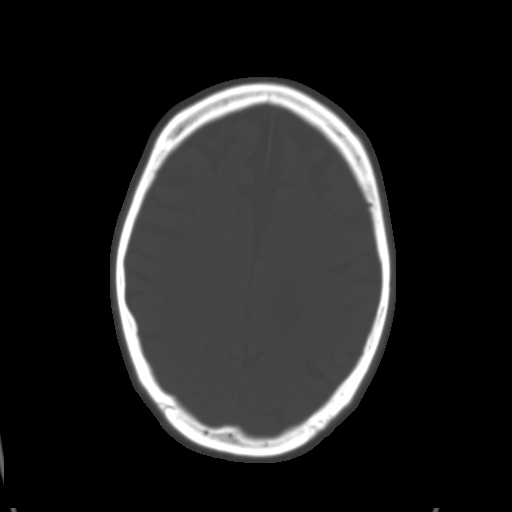
[im 22/30  brain]
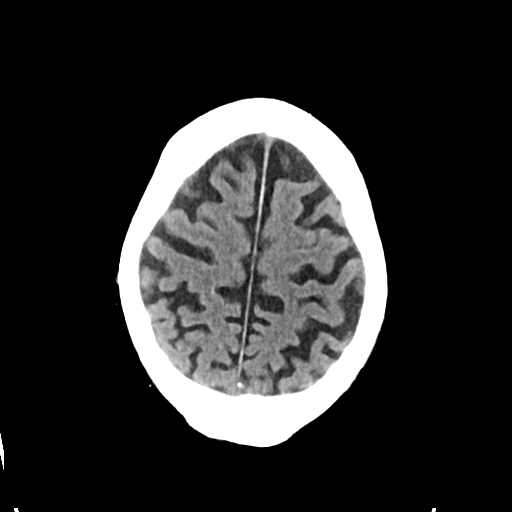
[im 26/30  brain]
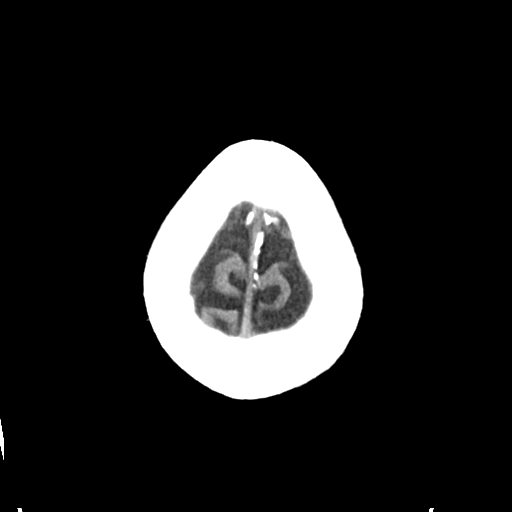

[Series 3: head bone · axial · 0.48mm/px · z∈[-159,-129]mm · 3 of 75 slices shown]
[im 8/75  bone]
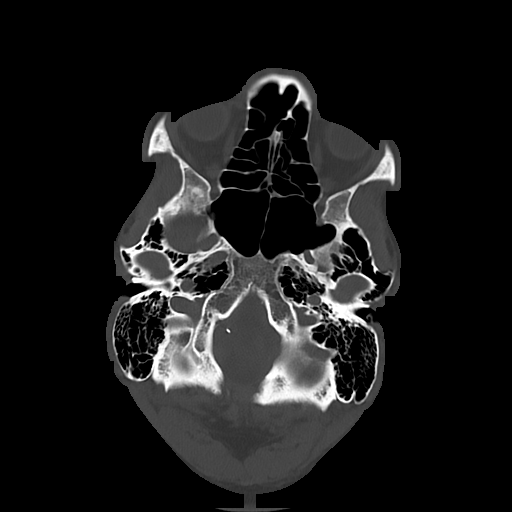
[im 15/75  bone]
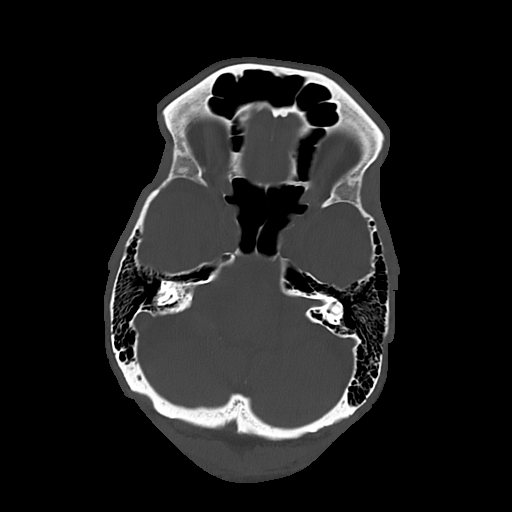
[im 23/75  bone]
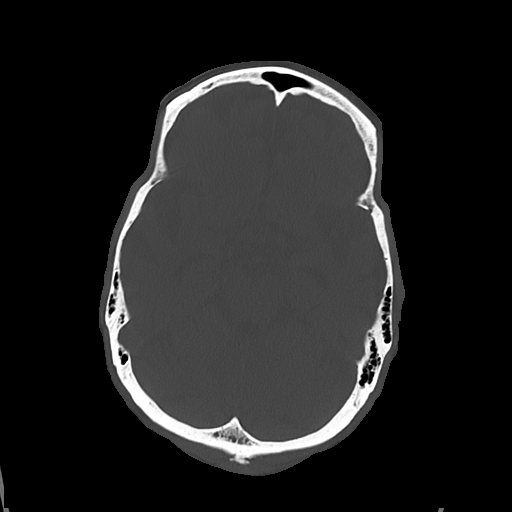

[Series 4: cor soft · coronal · 0.32mm/px · 3 of 72 slices shown]
[im 24/72  brain]
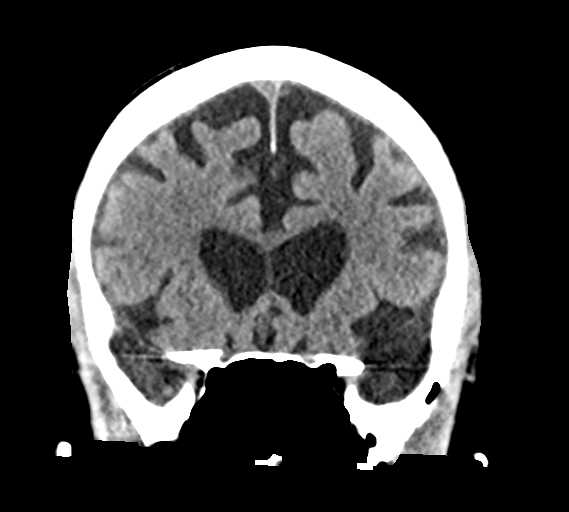
[im 32/72  brain]
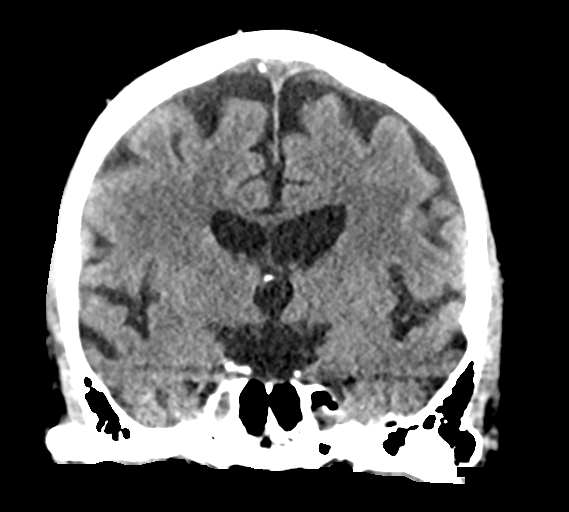
[im 40/72  brain]
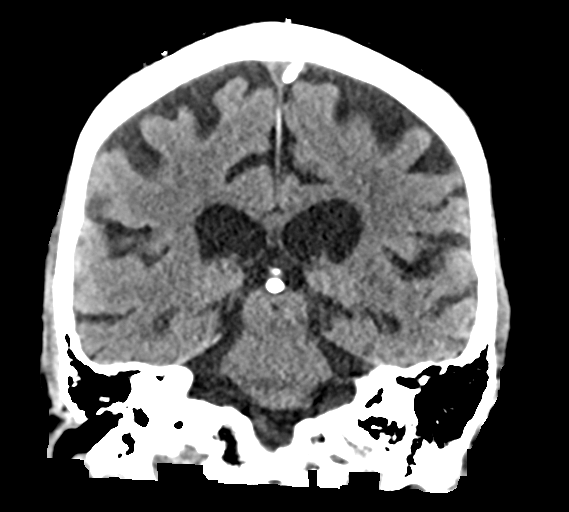

[Series 5: sag soft · sagittal · 0.32mm/px · 3 of 61 slices shown]
[im 21/61  brain]
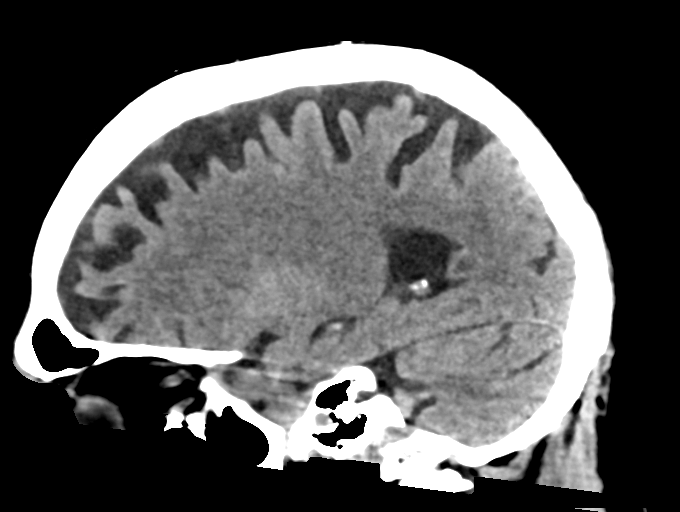
[im 31/61  brain]
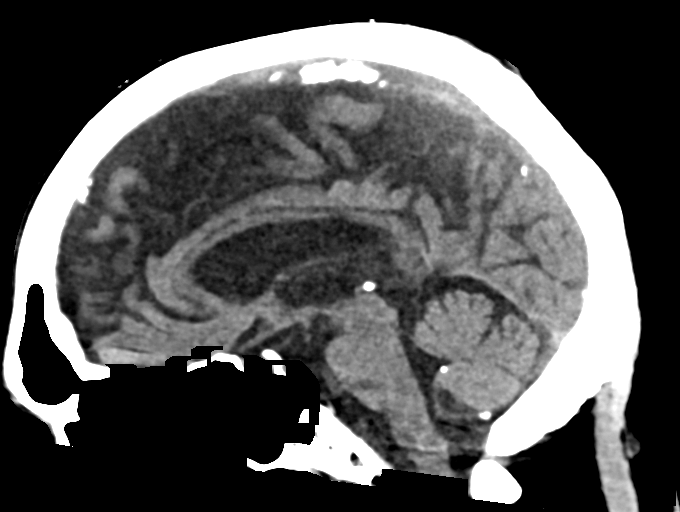
[im 41/61  brain]
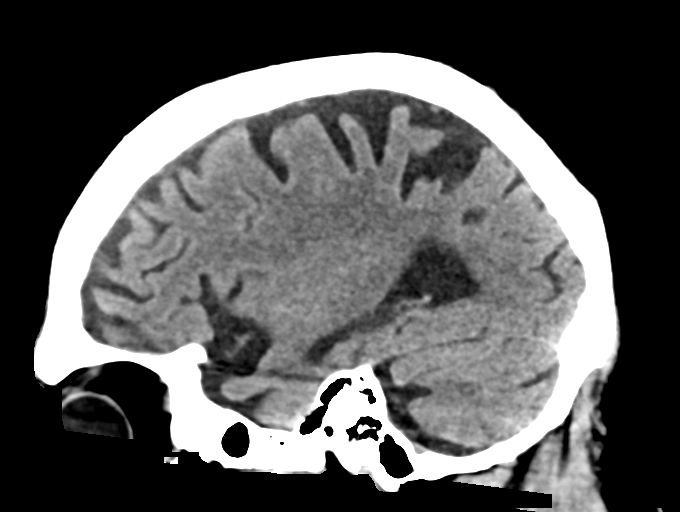

[16 of 47 positions shown; findings below may reference images not displayed]

FINDINGS: Brain: No evidence of acute infarction, hemorrhage, hydrocephalus,
extra-axial collection or mass lesion/mass effect. Cerebral atrophy.

Vascular: No hyperdense vessel identified. Calcific intracranial
atherosclerosis.

Skull: No acute fracture.

Sinuses/Orbits: Largely clear sinuses.  No acute orbital findings.

Other: No mastoid effusions.
IMPRESSION: 1. No evidence of acute intracranial abnormality.
2.  Cerebral atrophy ([id number]).

## 2023-11-05 IMAGING — CT CT CERVICAL SPINE W/O CM
3 of 4 series · 12 of 33 positions shown, 14 images · non-contrast
Comparison: None Available.

CLINICAL DATA: Trauma



[Series 5: sag bone · sagittal · 0.23mm/px · 5 of 74 slices shown, 6 images]
[im 25/74  bone]
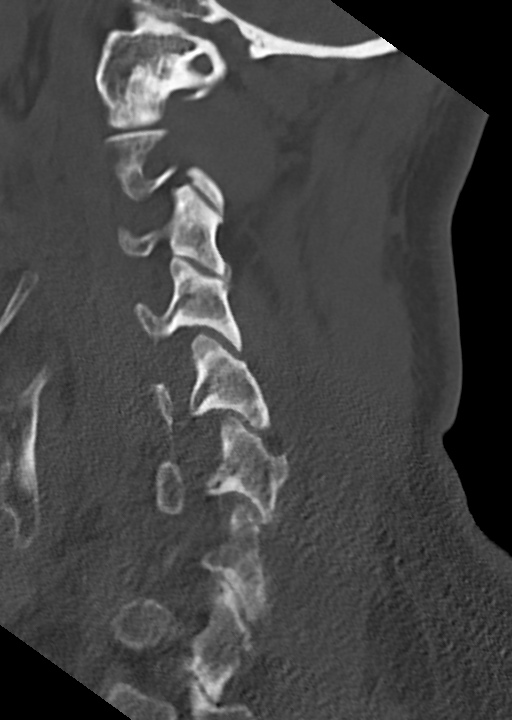
[im 31/74  bone]
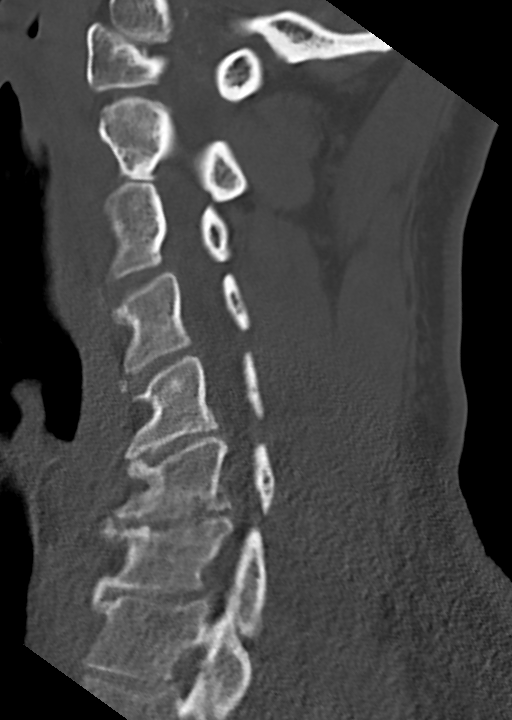
[im 37/74  soft-tissue]
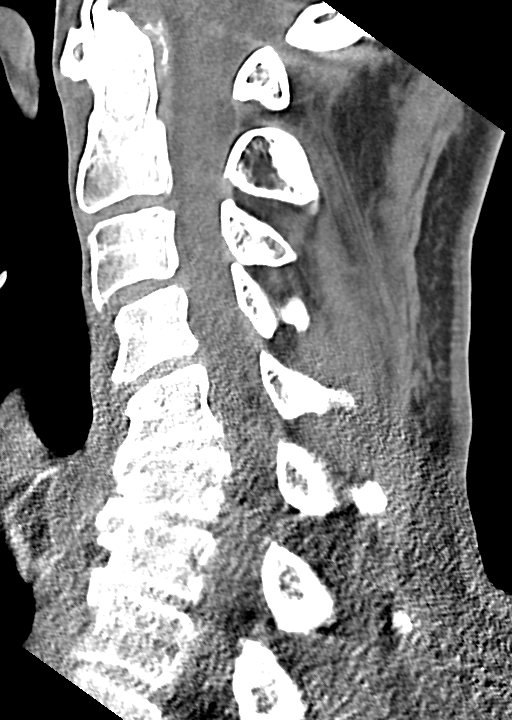
[im 37/74  bone]
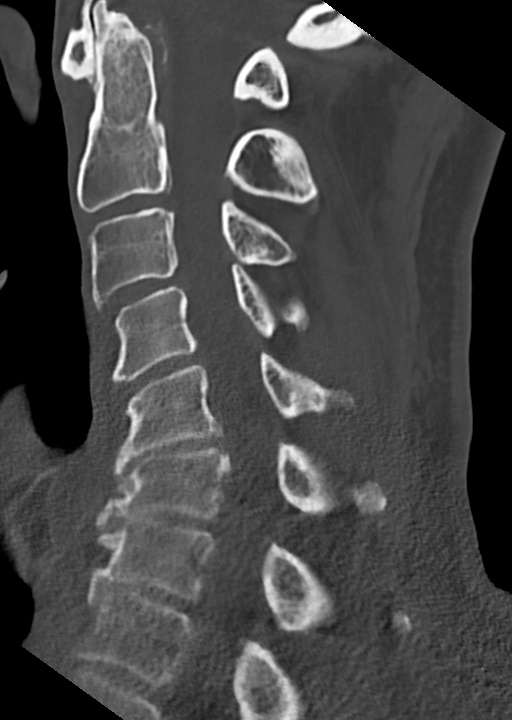
[im 43/74  bone]
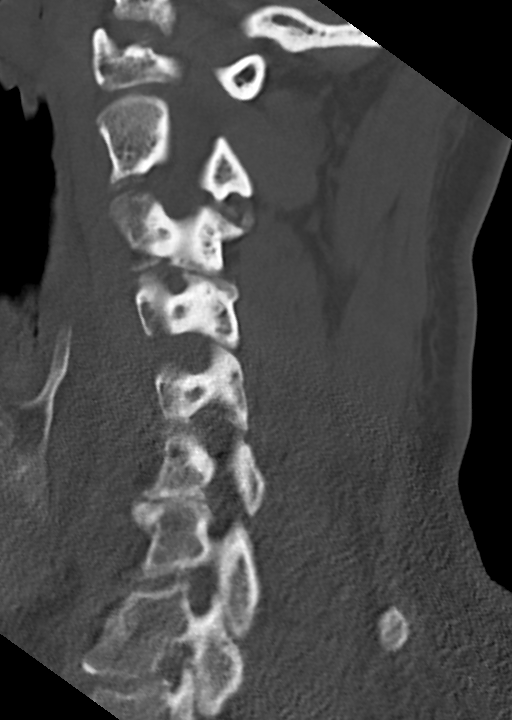
[im 49/74  bone]
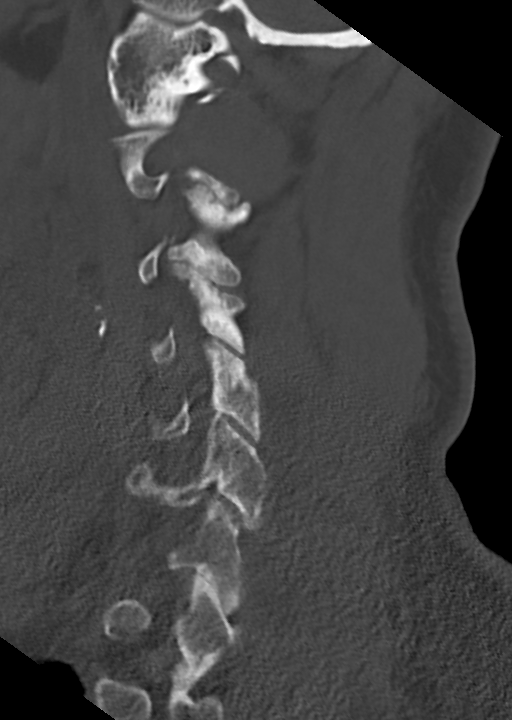

[Series 6: cor bone · coronal · 0.29mm/px · 3 of 60 slices shown]
[im 18/60  bone]
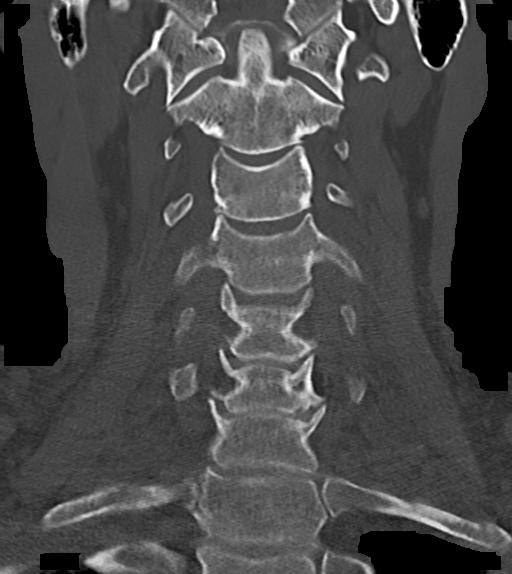
[im 26/60  bone]
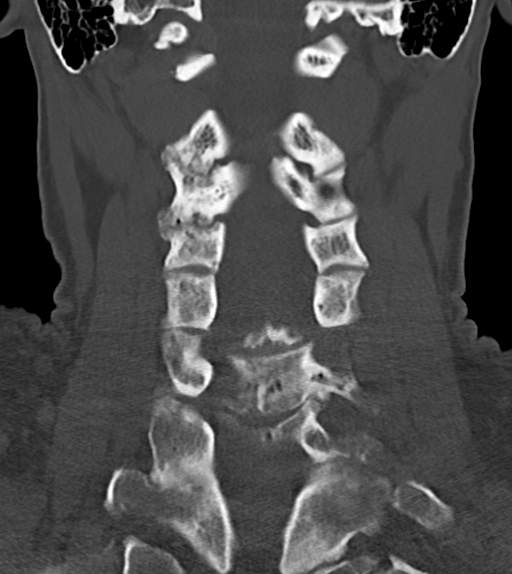
[im 34/60  bone]
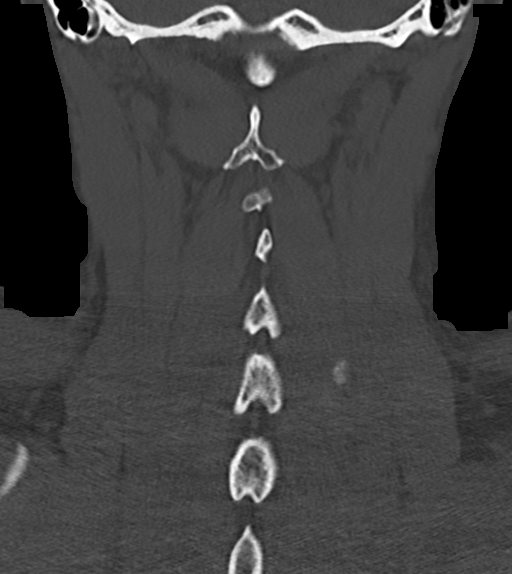

[Series 7: orthogonal axials · axial · 0.23mm/px · z∈[-298,-207]mm · 4 of 84 slices shown, 5 images]
[im 14/84  soft-tissue]
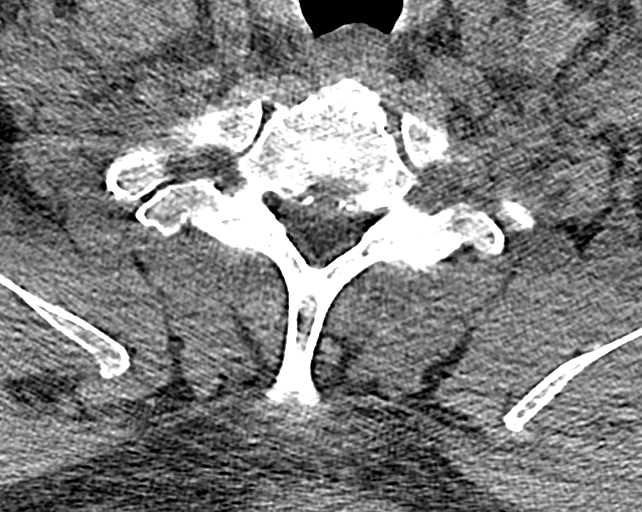
[im 14/84  bone]
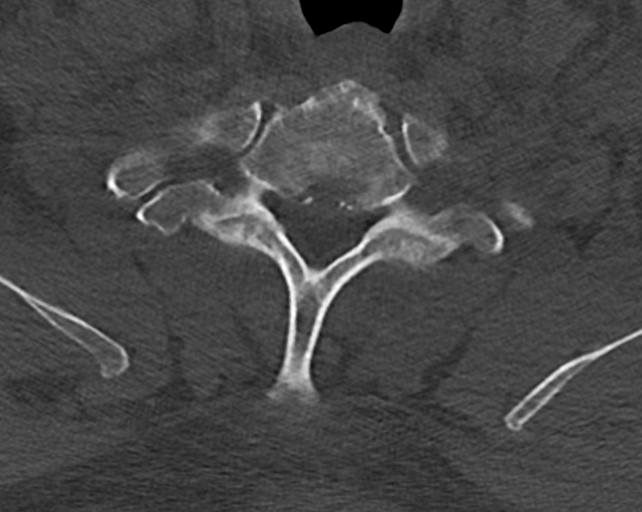
[im 28/84  bone]
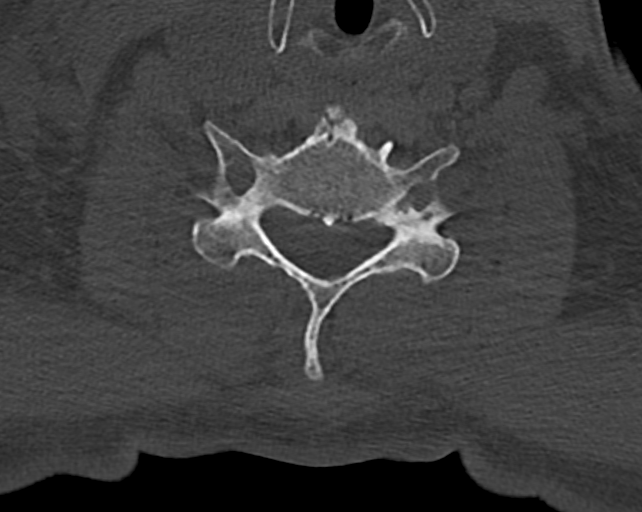
[im 56/84  bone]
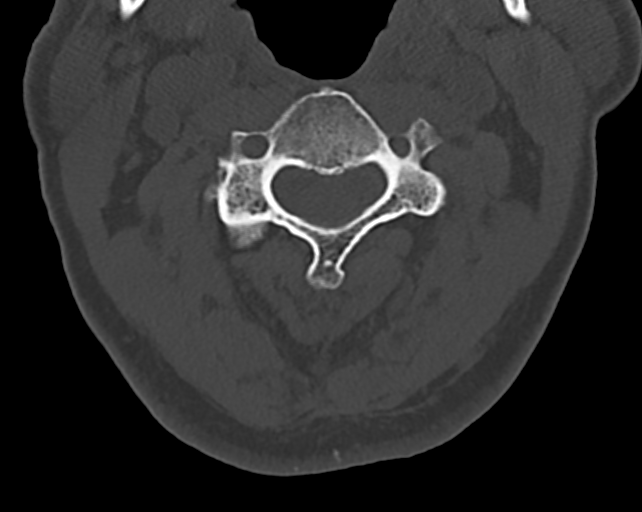
[im 70/84  bone]
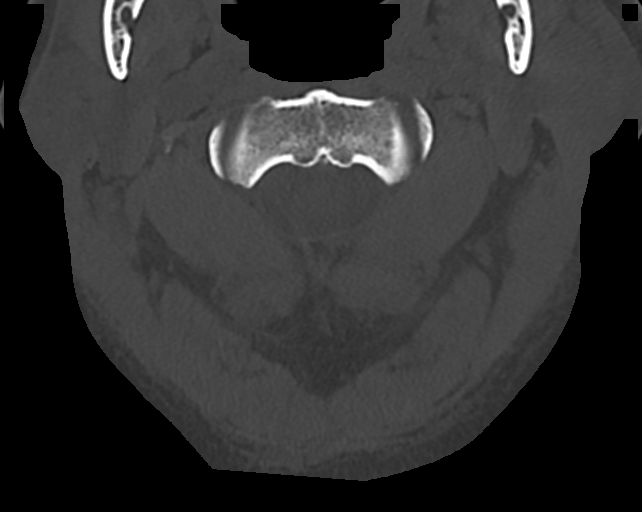

[12 of 33 positions shown; findings below may reference images not displayed]

FINDINGS: Alignment: Degenerative reversal of the normal cervical lordosis.

Skull base and vertebrae: No acute fracture. No primary bone lesion
or focal pathologic process.

Soft tissues and spinal canal: No prevertebral fluid or swelling. No
visible canal hematoma.

Disc levels: Moderate to severe disc space height loss and anterior
osteophytosis of C5 through T1, with otherwise mild disc space
height loss and osteophytosis of the upper cervical levels.

Upper chest: Negative.

Other: None.
IMPRESSION: 1. No acute fracture or static subluxation of the cervical spine.
2. Moderate to severe disc space height loss and anterior
osteophytosis of C5 through T1, with associated degenerative
reversal of the normal cervical lordosis.
